# Patient Record
Sex: Female | Born: 1965
Health system: Southern US, Community
[De-identification: ages and names within clinical notes are randomized; demographics above are authoritative.]

## PROBLEM LIST (undated history)

## (undated) DIAGNOSIS — B019 Varicella without complication: Secondary | ICD-10-CM

## (undated) DIAGNOSIS — R112 Nausea with vomiting, unspecified: Secondary | ICD-10-CM

## (undated) DIAGNOSIS — D649 Anemia, unspecified: Secondary | ICD-10-CM

## (undated) DIAGNOSIS — M797 Fibromyalgia: Secondary | ICD-10-CM

## (undated) DIAGNOSIS — M0579 Rheumatoid arthritis with rheumatoid factor of multiple sites without organ or systems involvement: Secondary | ICD-10-CM

## (undated) DIAGNOSIS — M542 Cervicalgia: Secondary | ICD-10-CM

## (undated) DIAGNOSIS — R51 Headache: Secondary | ICD-10-CM

## (undated) DIAGNOSIS — Z9889 Other specified postprocedural states: Secondary | ICD-10-CM

## (undated) DIAGNOSIS — J45909 Unspecified asthma, uncomplicated: Secondary | ICD-10-CM

## (undated) DIAGNOSIS — I499 Cardiac arrhythmia, unspecified: Secondary | ICD-10-CM

## (undated) DIAGNOSIS — M2550 Pain in unspecified joint: Secondary | ICD-10-CM

## (undated) DIAGNOSIS — G43909 Migraine, unspecified, not intractable, without status migrainosus: Secondary | ICD-10-CM

## (undated) DIAGNOSIS — R0602 Shortness of breath: Secondary | ICD-10-CM

## (undated) DIAGNOSIS — R001 Bradycardia, unspecified: Secondary | ICD-10-CM

## (undated) HISTORY — DX: Bradycardia, unspecified: R00.1

## (undated) HISTORY — PX: ABDOMINAL HYSTERECTOMY: SHX81

## (undated) HISTORY — PX: OTHER SURGICAL HISTORY: SHX169

## (undated) HISTORY — PX: VAGINAL HYSTERECTOMY: SHX2639

## (undated) HISTORY — DX: Migraine, unspecified, not intractable, without status migrainosus: G43.909

## (undated) HISTORY — PX: BACK SURGERY: SHX140

## (undated) HISTORY — PX: DIAGNOSTIC LAPAROSCOPY: SUR761

## (undated) HISTORY — PX: BLADDER SURGERY: SHX569

## (undated) HISTORY — PX: TUBAL LIGATION: SHX77

## (undated) HISTORY — DX: Varicella without complication: B01.9

## (undated) HISTORY — DX: Anemia, unspecified: D64.9

## (undated) HISTORY — DX: Unspecified asthma, uncomplicated: J45.909

---
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
## 2003-08-02 HISTORY — PX: WRIST FRACTURE SURGERY: SHX121

## 2009-09-11 LAB — HM DEXA SCAN: HM Dexa Scan: NORMAL

## 2012-01-19 LAB — HM MAMMOGRAPHY: HM MAMMO: NEGATIVE

## 2012-06-06 ENCOUNTER — Encounter: Payer: Self-pay | Admitting: Cardiology

## 2012-07-04 ENCOUNTER — Encounter (INDEPENDENT_AMBULATORY_CARE_PROVIDER_SITE_OTHER): Payer: 59

## 2012-07-04 ENCOUNTER — Other Ambulatory Visit: Payer: Self-pay

## 2012-07-04 ENCOUNTER — Ambulatory Visit (INDEPENDENT_AMBULATORY_CARE_PROVIDER_SITE_OTHER): Payer: 59 | Admitting: Cardiology

## 2012-07-04 ENCOUNTER — Encounter: Payer: Self-pay | Admitting: Cardiology

## 2012-07-04 ENCOUNTER — Ambulatory Visit: Payer: Self-pay | Admitting: Cardiology

## 2012-07-04 VITALS — BP 126/59 | HR 52 | Ht 63.0 in | Wt 225.8 lb

## 2012-07-04 DIAGNOSIS — R001 Bradycardia, unspecified: Secondary | ICD-10-CM

## 2012-07-04 DIAGNOSIS — I498 Other specified cardiac arrhythmias: Secondary | ICD-10-CM

## 2012-07-04 DIAGNOSIS — R5381 Other malaise: Secondary | ICD-10-CM

## 2012-07-04 DIAGNOSIS — R5383 Other fatigue: Secondary | ICD-10-CM | POA: Insufficient documentation

## 2012-07-04 NOTE — Patient Instructions (Signed)
We will have you wear a monitor for 48 hours. Continue your normal activity.

## 2012-07-04 NOTE — Progress Notes (Signed)
Nancy [name] Date of Birth: August 28, 1965 Medical Record #[id number]  History of Present Illness: Nancy [name] is seen at the request of Quentin Mulling PA for evaluation of bradycardia. She is a pleasant 46 year old white female. She states she was in her usual state of good health until the past 3-4 months. She states she just has not felt well. She has felt fatigued and cold. She has had some spells of lightheadedness. An ECG was done about a month ago and showed a pulse rate of 46. She has been monitoring her pulse rate at work and has run between 40s to the low 50s. She has been going to the gym regularly and has lost 10 pounds. She states her heart rate will he get up 107 beats per minute when she is walking on the treadmill. She has no prior cardiac history.  Current Outpatient Prescriptions on File Prior to Visit  Medication Sig Dispense Refill  . estradiol (VIVELLE-DOT) 0.1 MG/24HR Place 1 patch onto the skin 2 (two) times a week.        Allergies  Allergen Reactions  . Erythromycin Diarrhea  . Percocet (Oxycodone-Acetaminophen)     Past Medical History  Diagnosis Date  . Bradycardia   . SOB (shortness of breath)   . Asthma     Past Surgical History  Procedure Date  . Cesarean section     1992 and 1995  . Tubal ligation     1995  . Vaginal hysterectomy     1998 or 1997  . Bladder surgery July 10th 2013    History  Smoking status  . Never Smoker   Smokeless tobacco  . Not on file    History  Alcohol Use No    Family History  Problem Relation Age of Onset  . Hyperlipidemia Mother   . Hypertension Father     Review of Systems: The review of systems is positive for feelings of fatigue and feeling cold. She has been snoring some recently.  All other systems were reviewed and are negative.  Physical Exam: BP 126/59  Pulse 52  Ht 5\' 3"  (1.6 m)  Wt 225 lb 12.8 oz (102.422 kg)  BMI 40.00 kg/m2 She is a pleasant, obese white female in no acute distress.The  patient is alert and oriented x 3.  The mood and affect are normal.  The skin is warm and dry.  Color is normal.  The HEENT exam reveals that the sclera are nonicteric.  The mucous membranes are moist.  The carotids are 2+ without bruits.  There is no thyromegaly.  There is no JVD.  The lungs are clear.   The heart exam reveals a regular rate with a normal S1 and S2.  There are no murmurs, gallops, or rubs.  The PMI is not displaced.   Abdominal exam reveals good bowel sounds.   There are no masses.  Exam of the legs reveal no clubbing, cyanosis, or edema.  The legs are without rashes.  The distal pulses are intact.  Cranial nerves II - XII are intact.  Motor and sensory functions are intact.  The gait is normal.  LABORATORY DATA: ECG today demonstrates sinus bradycardia with a rate of 53 beats per minute. It is otherwise normal. Laboratory data from 06/06/2012 shows a normal CBC and chemistry panel. Total cholesterol 201, triglycerides 89, HDL 65, LDL 118. Magnesium is 1.8, TSH 0.966, A1c of 6%  Assessment / Plan: 1. Bradycardia. While her heart rate is slow on not sure  this is really pathologic. We will have her wear a 48 hour monitor to try and document any worsening of her bradycardia or significant pauses. This will also allow [place] to judge her heart rate response to activity. Her cardiac exam and ECG are otherwise normal. Laboratory data was normal. Followup after these studies. We discussed the possibility of doing an echocardiogram but at this point we will await the results of her monitor.  2. Fatigue. It is unclear whether this is related to her bradycardia or other factors. She is at increased risk of sleep apnea which may be contributing to her symptoms of fatigue. I've encouraged her with her weight loss.

## 2012-07-05 ENCOUNTER — Encounter: Payer: Self-pay | Admitting: Cardiology

## 2012-07-05 DIAGNOSIS — I498 Other specified cardiac arrhythmias: Secondary | ICD-10-CM

## 2012-07-13 ENCOUNTER — Telehealth: Payer: Self-pay | Admitting: Cardiology

## 2012-07-13 NOTE — Telephone Encounter (Signed)
plz return call to pt [phone] regarding monitor results

## 2012-07-16 NOTE — Telephone Encounter (Signed)
I left the pt a message that Dr Elvis Coil nurse will be back in the office on Tuesday and will contact her with the results of her heart monitor.

## 2012-07-16 NOTE — Telephone Encounter (Signed)
F/U    Patient calling stating she has not heard from the nurse regarding her test results

## 2012-07-17 NOTE — Telephone Encounter (Signed)
Patient called no answer.LMTC. 

## 2012-07-19 NOTE — Telephone Encounter (Signed)
Spoke to patient 07/17/12 was told Dr.Jordan reviewed monitor.Monitor okay.

## 2012-08-09 ENCOUNTER — Other Ambulatory Visit: Payer: Self-pay

## 2012-08-09 DIAGNOSIS — R001 Bradycardia, unspecified: Secondary | ICD-10-CM

## 2013-05-08 ENCOUNTER — Encounter (HOSPITAL_COMMUNITY): Payer: Self-pay | Admitting: Pharmacy Technician

## 2013-05-08 ENCOUNTER — Encounter (HOSPITAL_COMMUNITY)
Admission: RE | Admit: 2013-05-08 | Discharge: 2013-05-08 | Disposition: A | Discharge status: Home / Self Care | Payer: 59 | Source: Ambulatory Visit | Attending: Obstetrics and Gynecology | Admitting: Obstetrics and Gynecology

## 2013-05-08 ENCOUNTER — Encounter (HOSPITAL_COMMUNITY): Payer: Self-pay

## 2013-05-08 DIAGNOSIS — Z01812 Encounter for preprocedural laboratory examination: Secondary | ICD-10-CM | POA: Insufficient documentation

## 2013-05-08 HISTORY — DX: Headache: R51

## 2013-05-08 HISTORY — DX: Shortness of breath: R06.02

## 2013-05-08 HISTORY — DX: Nausea with vomiting, unspecified: Z98.890

## 2013-05-08 HISTORY — DX: Cardiac arrhythmia, unspecified: I49.9

## 2013-05-08 HISTORY — DX: Other specified postprocedural states: R11.2

## 2013-05-08 LAB — CBC
HCT: 38.3 % (ref 36.0–46.0)
Hemoglobin: 12.1 g/dL (ref 12.0–15.0)
MCH: 26.7 pg (ref 26.0–34.0)
MCHC: 31.6 g/dL (ref 30.0–36.0)
Platelets: 187 10*3/uL (ref 150–400)
WBC: 6.6 10*3/uL (ref 4.0–10.5)

## 2013-05-08 NOTE — Patient Instructions (Signed)
Your procedure is scheduled on:05/31/13  Enter through the Main Entrance at :6am Pick up desk phone and dial [phone] and inform us of your arrival.  Please call [phone] if you have any problems the morning of surgery.  Remember: Do not eat food or drink liquids, including water, after midnight:Thursday   You may brush your teeth the morning of surgery.   DO NOT wear jewelry, eye make-up, lipstick,body lotion, or dark fingernail polish.  (Polished toes are ok) You may wear deodorant.  If you are to be admitted after surgery, leave suitcase in car until your room has been assigned. Patients discharged on the day of surgery will not be allowed to drive home. Wear loose fitting, comfortable clothes for your ride home.

## 2013-05-31 ENCOUNTER — Encounter (HOSPITAL_COMMUNITY): Payer: Self-pay | Admitting: *Deleted

## 2013-05-31 ENCOUNTER — Ambulatory Visit (HOSPITAL_COMMUNITY)
Admission: RE | Admit: 2013-05-31 | Discharge: 2013-05-31 | Disposition: A | Discharge status: Home / Self Care | Payer: 59 | Source: Ambulatory Visit | Attending: Obstetrics and Gynecology | Admitting: Obstetrics and Gynecology

## 2013-05-31 ENCOUNTER — Encounter (HOSPITAL_COMMUNITY): Payer: 59 | Admitting: Anesthesiology

## 2013-05-31 ENCOUNTER — Encounter (HOSPITAL_COMMUNITY)
Admission: RE | Disposition: A | Discharge status: Home / Self Care | Payer: Self-pay | Source: Ambulatory Visit | Attending: Obstetrics and Gynecology

## 2013-05-31 ENCOUNTER — Ambulatory Visit (HOSPITAL_COMMUNITY): Payer: 59 | Admitting: Anesthesiology

## 2013-05-31 DIAGNOSIS — N83209 Unspecified ovarian cyst, unspecified side: Secondary | ICD-10-CM | POA: Insufficient documentation

## 2013-05-31 DIAGNOSIS — T83721A Exposure of implanted vaginal mesh and other prosthetic materials into vagina, initial encounter: Secondary | ICD-10-CM | POA: Insufficient documentation

## 2013-05-31 DIAGNOSIS — Y838 Other surgical procedures as the cause of abnormal reaction of the patient, or of later complication, without mention of misadventure at the time of the procedure: Secondary | ICD-10-CM | POA: Insufficient documentation

## 2013-05-31 DIAGNOSIS — Z90710 Acquired absence of both cervix and uterus: Secondary | ICD-10-CM | POA: Insufficient documentation

## 2013-05-31 DIAGNOSIS — R1031 Right lower quadrant pain: Secondary | ICD-10-CM | POA: Insufficient documentation

## 2013-05-31 HISTORY — PX: SALPINGOOPHORECTOMY: SHX82

## 2013-05-31 HISTORY — PX: LAPAROSCOPY: SHX197

## 2013-05-31 SURGERY — LAPAROSCOPY OPERATIVE
Anesthesia: General | Site: Abdomen | Laterality: Bilateral | Wound class: Clean Contaminated

## 2013-05-31 MED ORDER — CEFAZOLIN SODIUM-DEXTROSE 2-3 GM-% IV SOLR
INTRAVENOUS | Status: AC
  Filled 2013-05-31: qty 50

## 2013-05-31 MED ORDER — LIDOCAINE HCL (CARDIAC) 20 MG/ML IV SOLN
INTRAVENOUS | Status: AC
  Filled 2013-05-31: qty 5

## 2013-05-31 MED ORDER — BUPIVACAINE HCL (PF) 0.5 % IJ SOLN
INTRAMUSCULAR | Status: AC
  Filled 2013-05-31: qty 30

## 2013-05-31 MED ORDER — ROCURONIUM BROMIDE 100 MG/10ML IV SOLN
INTRAVENOUS | Status: DC | PRN
  Administered 2013-05-31: 40 mg via INTRAVENOUS

## 2013-05-31 MED ORDER — BUPIVACAINE HCL (PF) 0.5 % IJ SOLN
INTRAMUSCULAR | Status: DC | PRN
  Administered 2013-05-31: 17 mL

## 2013-05-31 MED ORDER — NEOSTIGMINE METHYLSULFATE 1 MG/ML IJ SOLN
INTRAMUSCULAR | Status: AC
  Filled 2013-05-31: qty 1

## 2013-05-31 MED ORDER — SODIUM CHLORIDE 0.9 % IJ SOLN
INTRAMUSCULAR | Status: AC
  Filled 2013-05-31: qty 10

## 2013-05-31 MED ORDER — MIDAZOLAM HCL 2 MG/2ML IJ SOLN
INTRAMUSCULAR | Status: DC | PRN
  Administered 2013-05-31: 2 mg via INTRAVENOUS

## 2013-05-31 MED ORDER — ONDANSETRON HCL 4 MG/2ML IJ SOLN
INTRAMUSCULAR | Status: DC | PRN
  Administered 2013-05-31: 4 mg via INTRAVENOUS

## 2013-05-31 MED ORDER — LACTATED RINGERS IV SOLN
INTRAVENOUS | Status: DC
  Administered 2013-05-31 (×3): via INTRAVENOUS

## 2013-05-31 MED ORDER — ONDANSETRON HCL 4 MG/2ML IJ SOLN
INTRAMUSCULAR | Status: AC
  Filled 2013-05-31: qty 2

## 2013-05-31 MED ORDER — LIDOCAINE HCL (CARDIAC) 20 MG/ML IV SOLN
INTRAVENOUS | Status: DC | PRN
  Administered 2013-05-31: 20 mg via INTRAVENOUS

## 2013-05-31 MED ORDER — GLYCOPYRROLATE 0.2 MG/ML IJ SOLN
INTRAMUSCULAR | Status: DC | PRN
  Administered 2013-05-31: 0.2 mg via INTRAVENOUS
  Administered 2013-05-31: .8 mg via INTRAVENOUS

## 2013-05-31 MED ORDER — FENTANYL CITRATE 0.05 MG/ML IJ SOLN
25.0000 ug | INTRAMUSCULAR | Status: DC | PRN
  Administered 2013-05-31: 50 ug via INTRAVENOUS

## 2013-05-31 MED ORDER — PROPOFOL 10 MG/ML IV BOLUS
INTRAVENOUS | Status: DC | PRN
  Administered 2013-05-31: 200 mg via INTRAVENOUS

## 2013-05-31 MED ORDER — INDIGOTINDISULFONATE SODIUM 8 MG/ML IJ SOLN
INTRAMUSCULAR | Status: AC
  Filled 2013-05-31: qty 5

## 2013-05-31 MED ORDER — SCOPOLAMINE 1 MG/3DAYS TD PT72
1.0000 | MEDICATED_PATCH | TRANSDERMAL | Status: DC
  Administered 2013-05-31: 1.5 mg via TRANSDERMAL

## 2013-05-31 MED ORDER — MIDAZOLAM HCL 2 MG/2ML IJ SOLN
INTRAMUSCULAR | Status: AC
  Filled 2013-05-31: qty 2

## 2013-05-31 MED ORDER — NEOSTIGMINE METHYLSULFATE 1 MG/ML IJ SOLN
INTRAMUSCULAR | Status: DC | PRN
  Administered 2013-05-31: 5 mg via INTRAVENOUS

## 2013-05-31 MED ORDER — FENTANYL CITRATE 0.05 MG/ML IJ SOLN
INTRAMUSCULAR | Status: DC | PRN
  Administered 2013-05-31 (×3): 50 ug via INTRAVENOUS
  Administered 2013-05-31: 100 ug via INTRAVENOUS

## 2013-05-31 MED ORDER — HEPARIN SODIUM (PORCINE) 5000 UNIT/ML IJ SOLN
INTRAMUSCULAR | Status: AC
  Filled 2013-05-31: qty 1

## 2013-05-31 MED ORDER — DEXAMETHASONE SODIUM PHOSPHATE 10 MG/ML IJ SOLN
INTRAMUSCULAR | Status: DC | PRN
  Administered 2013-05-31: 10 mg via INTRAVENOUS

## 2013-05-31 MED ORDER — GLYCOPYRROLATE 0.2 MG/ML IJ SOLN
INTRAMUSCULAR | Status: AC
  Filled 2013-05-31: qty 1

## 2013-05-31 MED ORDER — CEFAZOLIN SODIUM-DEXTROSE 2-3 GM-% IV SOLR
2.0000 g | INTRAVENOUS | Status: AC
  Administered 2013-05-31: 2 g via INTRAVENOUS

## 2013-05-31 MED ORDER — FENTANYL CITRATE 0.05 MG/ML IJ SOLN
INTRAMUSCULAR | Status: AC
  Administered 2013-05-31: 50 ug via INTRAVENOUS
  Filled 2013-05-31: qty 2

## 2013-05-31 MED ORDER — FENTANYL CITRATE 0.05 MG/ML IJ SOLN
INTRAMUSCULAR | Status: AC
  Filled 2013-05-31: qty 5

## 2013-05-31 MED ORDER — PROPOFOL 10 MG/ML IV EMUL
INTRAVENOUS | Status: AC
  Filled 2013-05-31: qty 20

## 2013-05-31 MED ORDER — ROCURONIUM BROMIDE 100 MG/10ML IV SOLN
INTRAVENOUS | Status: AC
  Filled 2013-05-31: qty 1

## 2013-05-31 MED ORDER — FENTANYL CITRATE 0.05 MG/ML IJ SOLN
INTRAMUSCULAR | Status: AC
  Filled 2013-05-31: qty 2

## 2013-05-31 MED ORDER — GLYCOPYRROLATE 0.2 MG/ML IJ SOLN
INTRAMUSCULAR | Status: AC
  Filled 2013-05-31: qty 4

## 2013-05-31 MED ORDER — LIDOCAINE HCL 1 % IJ SOLN
INTRAMUSCULAR | Status: AC
  Filled 2013-05-31: qty 20

## 2013-05-31 MED ORDER — DEXAMETHASONE SODIUM PHOSPHATE 10 MG/ML IJ SOLN
INTRAMUSCULAR | Status: AC
  Filled 2013-05-31: qty 1

## 2013-05-31 MED ORDER — SCOPOLAMINE 1 MG/3DAYS TD PT72
MEDICATED_PATCH | TRANSDERMAL | Status: AC
  Administered 2013-05-31: 1.5 mg via TRANSDERMAL
  Filled 2013-05-31: qty 1

## 2013-05-31 SURGICAL SUPPLY — 28 items
BARRIER ADHS 3X4 INTERCEED (GAUZE/BANDAGES/DRESSINGS) IMPLANT
CABLE HIGH FREQUENCY MONO STRZ (ELECTRODE) ×3 IMPLANT
CATH ROBINSON RED A/P 16FR (CATHETERS) ×3 IMPLANT
CLOTH BEACON ORANGE TIMEOUT ST (SAFETY) ×3 IMPLANT
DERMABOND ADHESIVE PROPEN (GAUZE/BANDAGES/DRESSINGS) ×1
DERMABOND ADVANCED (GAUZE/BANDAGES/DRESSINGS) ×1
DERMABOND ADVANCED .7 DNX12 (GAUZE/BANDAGES/DRESSINGS) ×2 IMPLANT
DERMABOND ADVANCED .7 DNX6 (GAUZE/BANDAGES/DRESSINGS) ×2 IMPLANT
GLOVE BIO SURGEON STRL SZ7.5 (GLOVE) ×6 IMPLANT
GOWN PREVENTION PLUS LG XLONG (DISPOSABLE) ×3 IMPLANT
NEEDLE INSUFFLATION 120MM (ENDOMECHANICALS) ×3 IMPLANT
NS IRRIG 1000ML POUR BTL (IV SOLUTION) IMPLANT
PACK LAPAROSCOPY BASIN (CUSTOM PROCEDURE TRAY) ×3 IMPLANT
POUCH SPECIMEN RETRIEVAL 10MM (ENDOMECHANICALS) IMPLANT
PROTECTOR NERVE ULNAR (MISCELLANEOUS) ×3 IMPLANT
SEALER TISSUE G2 CVD JAW 35 (ENDOMECHANICALS) IMPLANT
SEALER TISSUE G2 CVD JAW 45CM (ENDOMECHANICALS) ×3 IMPLANT
SET IRRIG TUBING LAPAROSCOPIC (IRRIGATION / IRRIGATOR) ×3 IMPLANT
STRIP CLOSURE SKIN 1/4X3 (GAUZE/BANDAGES/DRESSINGS) IMPLANT
SUT VIC AB 3-0 PS2 18 (SUTURE) ×1
SUT VIC AB 3-0 PS2 18XBRD (SUTURE) ×2 IMPLANT
SUT VICRYL 0 UR6 27IN ABS (SUTURE) ×3 IMPLANT
SYR 30ML LL (SYRINGE) IMPLANT
TOWEL OR 17X24 6PK STRL BLUE (TOWEL DISPOSABLE) ×6 IMPLANT
TROCAR XCEL NON-BLD 11X100MML (ENDOMECHANICALS) ×3 IMPLANT
TROCAR XCEL NON-BLD 5MMX100MML (ENDOMECHANICALS) ×6 IMPLANT
WARMER LAPAROSCOPE (MISCELLANEOUS) ×3 IMPLANT
WATER STERILE IRR 1000ML POUR (IV SOLUTION) IMPLANT

## 2013-05-31 NOTE — Transfer of Care (Signed)
Immediate Anesthesia Transfer of Care Note  Patient: Nancy [name]  Procedure(s) Performed: Procedure(s) with comments: LAPAROSCOPY withbilateral salpingo-oophorectomy, lysis of adhesions (N/A) - clippings of Vaginal  mesh BILATERAL SALPINGO OOPHORECTOMY/TRIM MESH (Bilateral)  Patient Location: PACU  Anesthesia Type:General  Level of Consciousness: awake, alert  and oriented  Airway & Oxygen Therapy: Patient Spontanous Breathing and Patient connected to nasal cannula oxygen  Post-op Assessment: Report given to PACU RN and Post -op Vital signs reviewed and stable  Post vital signs: Reviewed and stable  Complications: No apparent anesthesia complications

## 2013-05-31 NOTE — Brief Op Note (Signed)
05/31/2013  8:51 AM  PATIENT:  Nancy [name]  47 y.o. female  PRE-OPERATIVE DIAGNOSIS:  right ovarian cyst, Right lower quadrant pain, mesh exposure  POST-OPERATIVE DIAGNOSIS:  right ovarian cyst, Right lower quadrant pain, mesh exposure  PROCEDURE:  Laparoscopy, bilateral salpingo-oophorectomy,lysis of adhesions, trimming of vaginal mesh  SURGEON:  Surgeon(s) and Role:    * Leslie Andrea, MD - Primary  PHYSICIAN ASSISTANT:   ASSISTANTS: none   ANESTHESIA:   general  EBL:  Total I/O In: 1000 [I.V.:1000] Out: 40 [Blood:40]  BLOOD ADMINISTERED:none  DRAINS: none   LOCAL MEDICATIONS USED:  MARCAINE     SPECIMEN:  Source of Specimen:  bilateral tubes/ovaries  DISPOSITION OF SPECIMEN:  PATHOLOGY  COUNTS:  YES  TOURNIQUET:  * No tourniquets in log *  DICTATION: .Other Dictation: Dictation Number  [id number]  PLAN OF CARE: Discharge to home after PACU  PATIENT DISPOSITION:  PACU - hemodynamically stable.   Delay start of Pharmacological VTE agent (>24hrs) due to surgical blood loss or risk of bleeding: not applicable

## 2013-05-31 NOTE — Op Note (Signed)
Nancy [name], Nancy [name]              ACCOUNT NO.:  [id number]  MEDICAL RECORD NO.:  [id number]  LOCATION:  [facility]                          FACILITY:  [facility]  PHYSICIAN:  Guy Sandifer. Henderson Cloud, M.D. DATE OF BIRTH:  05-29-1966  DATE OF PROCEDURE:  05/31/2013 DATE OF DISCHARGE:  05/31/2013                              OPERATIVE REPORT   PREOPERATIVE DIAGNOSES: 1. Right lower quadrant pain. 2. Right ovarian cyst. 3. Vaginal mesh exposure.  POSTOPERATIVE DIAGNOSES: 1. Right lower quadrant pain. 2. Right ovarian cyst. 3. Vaginal mesh exposure.  PROCEDURE:  Laparoscopy with bilateral salpingo-oophorectomy, lysis of adhesions, and trimming of vaginal mesh.  SURGEON:  Guy Sandifer. Henderson Cloud, M.D.  ANESTHESIA:  General with endotracheal intubation.  ESTIMATED BLOOD LOSS:  Less than 50 mL.  SPECIMENS:  Bilateral tubes and ovaries to Pathology.  INDICATIONS AND CONSENT:  This patient is a 47 year old married white female, status post hysterectomy who had a mid urethral sling placed 1 to 1-1/2 years ago in [place].  Following that, she has had some exposure of the mesh to the right side of the midline.  This has caused discomfort with intercourse.  It was trimmed slightly in the office but was unable to see it adequately to trim it further.  The patient has also had right lower quadrant pain for several weeks.  Ultrasound is consistent with approximately 3-cm simple right ovarian cyst that is shrinking.  However, the pain continues.  After discussion of options, laparoscopy with bilateral salpingo-oophorectomy and trimming of vaginal mesh has been discussed.  Potential risks and complications of surgery have been reviewed preoperatively including but not limited to infection, organ damage, bleeding requiring transfusion of blood products with HIV and hepatitis acquisition, DVT, PE, pneumonia, laparotomy, possibility of continued or recurrent pelvic or abdominal pain, painful intercourse has been  discussed.  The fact we are going to trim visible mesh and not totally resect it as well as the fact that further surgery could be required later to completely resolve that has also been discussed.  All questions were answered and consent was signed on the chart.  FINDINGS:  Upper abdomen is grossly normal.  In the pelvis, there are omental adhesions to the anterior abdominal wall at the pelvic brim. The right ovary contains an approximately 2.5-cm smooth translucent cyst.  It is slightly adherent on its medial pole.  The left ovary is normal but is adherent to the pelvic sidewall as well.  DESCRIPTION OF PROCEDURE:  The patient was taken to the operating room where she was identified, placed in dorsal supine position.  General anesthesia was induced via endotracheal intubation.  She is placed in dorsal lithotomy position.  She is prepped abdominally and vaginally and bladder straight catheterized and draped in sterile fashion.  Time-out was undertaken.  The infraumbilical and suprapubic areas were injected in midline with approximately 7 mL of 0.5% plain Marcaine.  A small infraumbilical incision was made.  Disposable Veress needle was placed and a good syringe and drop test were noted.  A 2 L of gas were insufflated with good tympany in the right upper quadrant.  Veress needle was removed and 10/11 Xcel bladeless disposable trocar sleeve was placed using  direct visualization with the diagnostic scope.  The operative scope was then used.  Small suprapubic incision was made in the midline and a 5-mm Xcel bladeless disposable trocar sleeve was placed under direct visualization without difficulty.  The adhesions were taken down with Enseal bipolar cautery cutting instrument without difficulty.  Good hemostasis was maintained.  Course of the left ureter is identified and seen to be clear of the area of surgery.  The left ovary is elevated with sharp and blunt dissection.  This mobilized  the ovary and the infundibulopelvic ligament.  Then using the Enseal bipolar cautery cutting instrument, the left infundibulopelvic ligament was taken down.  Remainder of the broad ligament was taken down freeing the ovary.  Ovaries delivered through the umbilical trocar sleeve.  Similar procedure was carried out on the right side.  The right ovary is also delivered.  Minor bipolar cautery was used to obtain complete hemostasis at the site of the BSO.  Inspection under reduced pneumoperitoneum reveals good hemostasis.  The remaining approximately 20 mL of 0.5% plain Marcaine is instilled into the peritoneal cavity.  Instruments are removed.  Suprapubic trocar sleeve was removed.  Pneumoperitoneum was reduced.  The umbilical trocar sleeve was removed.  The umbilical incision was closed with 0 Vicryl suture in the subcu layers under good visualization.  The skin was closed on both incisions with interrupted 3- 0 Vicryl and Dermabond was placed.  Attention was then turned to the vagina.  With adequate exposure, there is approximately 3-mm edge of the sling noted to the right side of the midline.  This has eroded through the mucosa, but there is no other further exposure of mesh identified. This was trimmed down to the level of the surrounding mucosa. Examination with the examining finger reveals the area to now be smooth. Good hemostasis was noted.  The procedure was then ended.  All counts correct.  The patient is awakened and taken to recovery room in stable condition.     Guy Sandifer Henderson Cloud, M.D.     JET/MEDQ  D:  05/31/2013  T:  05/31/2013  Job:  [id number]

## 2013-05-31 NOTE — Anesthesia Postprocedure Evaluation (Signed)
Anesthesia Post Note  Patient: Nancy [name]  Procedure(s) Performed: Procedure(s) (LRB): LAPAROSCOPY withbilateral salpingo-oophorectomy, lysis of adhesions (Bilateral) BILATERAL SALPINGO OOPHORECTOMY/TRIM MESH (Bilateral)  Anesthesia type: GA  Patient location: PACU  Post pain: Pain level controlled  Post assessment: Post-op Vital signs reviewed  Last Vitals:  Filed Vitals:   05/31/13 1015  BP:   Pulse:   Temp: 36.8 C  Resp: 12    Post vital signs: Reviewed  Level of consciousness: sedated  Complications: No apparent anesthesia complications

## 2013-05-31 NOTE — Anesthesia Procedure Notes (Signed)
Procedure Name: Intubation Date/Time: 05/31/2013 7:43 AM Performed by: Leslie Andrea Pre-anesthesia Checklist: Patient identified, Emergency Drugs available, Suction available and Patient being monitored Patient Re-evaluated:Patient Re-evaluated prior to inductionOxygen Delivery Method: Circle system utilized Preoxygenation: Pre-oxygenation with 100% oxygen Intubation Type: IV induction Ventilation: Mask ventilation without difficulty Laryngoscope Size: Miller and 2 Grade View: Grade II Tube type: Oral Tube size: 7.0 mm Airway Equipment and Method: Stylet Placement Confirmation: ETT inserted through vocal cords under direct vision,  positive ETCO2 and breath sounds checked- equal and bilateral Secured at: 7 cm Tube secured with: Tape Dental Injury: Teeth and Oropharynx as per pre-operative assessment

## 2013-05-31 NOTE — H&P (Signed)
Nancy [name] is an 47 y.o. female with RLQ pain for several months. U/S in office C/W about 3 cm simple right ovarian cyst that is shrinking on recent U/S. However, pain continues. After discussion, L/S is elected by patient. Also, she desires BSO.  Also wants to trim mesh.  Pertinent Gynecological History: Menses: N/A Bleeding: N/A Contraception: N/A DES exposure: denies Blood transfusions: none Sexually transmitted diseases: no past history Previous GYN Procedures: hysterectomy  Last mammogram: normal Date: 2014 Last pap: normal Date: 2014 OB History: G2, P2   Menstrual History: Menarche age: unknown  No LMP recorded. Patient has had a hysterectomy.    Past Medical History  Diagnosis Date  . Bradycardia   . Asthma   . Shortness of breath     with bradycardia  . Dysrhythmia     occ. palpitations  . Headache(784.0)   . PONV (postoperative nausea and vomiting)     Past Surgical History  Procedure Laterality Date  . Cesarean section      1992 and 1995  . Tubal ligation      1995  . Vaginal hysterectomy      1998 or 1997  . Bladder surgery  July 10th 2013  . Diagnostic laparoscopy      Family History  Problem Relation Age of Onset  . Hyperlipidemia Mother   . Hypertension Father     Social History:  reports that she has never smoked. She does not have any smokeless tobacco history on file. She reports that she does not drink alcohol or use illicit drugs.  Allergies:  Allergies  Allergen Reactions  . Advair Diskus [Fluticasone-Salmeterol] Cough    excessive  . Erythromycin Diarrhea and Nausea And Vomiting  . Percocet [Oxycodone-Acetaminophen] Other (See Comments)    hallucinations    Prescriptions prior to admission  Medication Sig Dispense Refill  . aspirin-acetaminophen-caffeine (EXCEDRIN MIGRAINE) 250-250-65 MG per tablet Take 2 tablets by mouth every 6 (six) hours as needed (migraine).      . Melatonin 3 MG TABS Take 1 tablet by mouth at bedtime as  needed (sleep).      . naproxen sodium (ANAPROX) 220 MG tablet Take 220 mg by mouth daily as needed.      Marland Kitchen albuterol (PROVENTIL HFA;VENTOLIN HFA) 108 (90 BASE) MCG/ACT inhaler Inhale 2 puffs into the lungs every 6 (six) hours as needed for wheezing or shortness of breath (rescue).      Marland Kitchen estradiol (ESTRACE) 0.1 MG/GM vaginal cream Place 0.5 g vaginally See admin instructions. 2-3 times weekly        Review of Systems  Constitutional: Negative for fever.    Blood pressure 121/75, pulse 54, temperature 97.9 F (36.6 C), temperature source Oral, resp. rate 18, height 5\' 3"  (1.6 m), weight 235 lb (106.595 kg), SpO2 98.00%. Physical Exam  Cardiovascular: Normal rate and regular rhythm.   Respiratory: Effort normal and breath sounds normal.  GI: Soft. There is no tenderness.  Genitourinary:  Small margin of mid urethral sling mesh palpable to right of midline Adnexa NT without palpable masses    No results found for this or any previous visit (from the past 24 hour(s)).  No results found.  Assessment/Plan: 47 yo S/P hysterectomy with RLQ pain and right ovarian cyst. Also exposure of mesh of mid urethral sling. D./W patient L/S, BSO and trim of visible mesh. Risks reviewed including infection, organ damage, bleeding/transfusion-HIV/Hep, DVT/PE, pneumonia, laparotomy. Patient understands that pelvic/abdominal pain or pain with intercourse may persist or get  worse after surgery. Also, more surgery could be necessary to address mesh exposure. All questions answered.  Zavia Pullen II,Glennis Montenegro E 05/31/2013, 7:26 AM

## 2013-05-31 NOTE — Anesthesia Preprocedure Evaluation (Signed)
Anesthesia Evaluation  Patient identified by MRN, date of birth, ID band Patient awake    Reviewed: Allergy & Precautions, H&P , Patient's Chart, lab work & pertinent test results, reviewed documented beta blocker date and time   Airway Mallampati: II TM Distance: >3 FB Neck ROM: full    Dental no notable dental hx. (+) Caps,    Pulmonary  breath sounds clear to auscultation  Pulmonary exam normal       Cardiovascular Rhythm:regular Rate:Normal     Neuro/Psych    GI/Hepatic   Endo/Other  Morbid obesity  Renal/GU      Musculoskeletal   Abdominal   Peds  Hematology   Anesthesia Other Findings Prominent capped front teeth. Dental advisory given  Reproductive/Obstetrics                           Anesthesia Physical Anesthesia Plan  ASA: III  Anesthesia Plan: General   Post-op Pain Management:    Induction: Intravenous  Airway Management Planned: Oral ETT  Additional Equipment:   Intra-op Plan:   Post-operative Plan: Extubation in OR  Informed Consent: I have reviewed the patients History and Physical, chart, labs and discussed the procedure including the risks, benefits and alternatives for the proposed anesthesia with the patient or authorized representative who has indicated his/her understanding and acceptance.   Dental Advisory Given and Dental advisory given  Plan Discussed with: CRNA and Surgeon  Anesthesia Plan Comments: (  Discussed general anesthesia, including possible nausea, instrumentation of airway, sore throat,pulmonary aspiration, etc. I asked if the were any outstanding questions, or  concerns before we proceeded. )        Anesthesia Quick Evaluation

## 2013-06-03 ENCOUNTER — Encounter (HOSPITAL_COMMUNITY): Payer: Self-pay | Admitting: Obstetrics and Gynecology

## 2013-11-20 ENCOUNTER — Ambulatory Visit (INDEPENDENT_AMBULATORY_CARE_PROVIDER_SITE_OTHER): Payer: 59 | Admitting: Family Medicine

## 2013-11-20 ENCOUNTER — Encounter: Payer: Self-pay | Admitting: Family Medicine

## 2013-11-20 VITALS — BP 110/80 | HR 68 | Temp 98.2°F | Ht 62.5 in | Wt 240.0 lb

## 2013-11-20 DIAGNOSIS — M5412 Radiculopathy, cervical region: Secondary | ICD-10-CM

## 2013-11-20 DIAGNOSIS — J4520 Mild intermittent asthma, uncomplicated: Secondary | ICD-10-CM | POA: Insufficient documentation

## 2013-11-20 DIAGNOSIS — IMO0001 Reserved for inherently not codable concepts without codable children: Secondary | ICD-10-CM

## 2013-11-20 DIAGNOSIS — J45909 Unspecified asthma, uncomplicated: Secondary | ICD-10-CM

## 2013-11-20 LAB — SEDIMENTATION RATE: SED RATE: 43 mm/h — AB (ref 0–22)

## 2013-11-20 MED ORDER — TRAMADOL HCL 50 MG PO TABS: ORAL_TABLET | ORAL | Status: DC

## 2013-11-20 NOTE — Progress Notes (Addendum)
Subjective:    Patient ID: Nancy [name], female    DOB: 07-26-1966, 48 y.o.   MRN: [id number]  HPI Patient seen to establish care.  History of mild intermittent asthma. She's had migraine headaches in the past. Reported rheumatic fever but no known cardiac complications. Nonsmoker. She's had several previous surgeries as outlined elsewhere.  Maj. acute issue is over 3 month history of cervical neck pains. Location is lower cervical region with some radiation to the right upper extremity. She's had occasional episodes of right upper extremity numbness and even transient weakness. She's not taking any medications other than tramadol which does help somewhat. No prior history of neck problems  Family history significant for hyperlipidemia, hypertension. Mother has had fibromyalgia and polymyalgia rheumatica as well as osteoporosis.  Patient's had previous bone density scanning which was normal. Previous hysterectomy. Prior labs from outside were reviewed. She's not had lab work about a year and a half.  Past Medical History  Diagnosis Date  . Bradycardia   . Asthma   . Shortness of breath     with bradycardia  . Dysrhythmia     occ. palpitations  . Headache(784.0)   . PONV (postoperative nausea and vomiting)   . Chicken pox   . Migraines    Past Surgical History  Procedure Laterality Date  . Cesarean section      1992 and 1995  . Tubal ligation      1995  . Vaginal hysterectomy      1998 or 1997  . Bladder surgery  July 10th 2013  . Diagnostic laparoscopy    . Laparoscopy Bilateral 05/31/2013    Procedure: LAPAROSCOPY withbilateral salpingo-oophorectomy, lysis of adhesions;  Surgeon: Allena Katz, MD;  Location: [facility];  Service: Gynecology;  Laterality: Bilateral;  with clippings of Vaginal  mesh  . Salpingoophorectomy Bilateral 05/31/2013    Procedure: BILATERAL SALPINGO OOPHORECTOMY/TRIM MESH;  Surgeon: Allena Katz, MD;  Location: [facility];  Service: Gynecology;   Laterality: Bilateral;  . Wrist fracture surgery  2005    reports that she has never smoked. She does not have any smokeless tobacco history on file. She reports that she does not drink alcohol or use illicit drugs. family history includes Arthritis in her mother; Cancer in her maternal grandfather and paternal grandmother; Diabetes in her maternal grandmother and paternal grandmother; Heart disease in her maternal grandfather and maternal grandmother; Hyperlipidemia in her mother; Hypertension in her father. Allergies  Allergen Reactions  . Advair Diskus [Fluticasone-Salmeterol] Cough    excessive  . Erythromycin Diarrhea and Nausea And Vomiting  . Percocet [Oxycodone-Acetaminophen] Other (See Comments)    hallucinations      Review of Systems  Constitutional: Negative for fever and chills.  Musculoskeletal: Positive for neck pain.  Neurological: Positive for weakness and numbness.       Objective:   Physical Exam  Constitutional: She appears well-developed and well-nourished.  Cardiovascular: Normal rate and regular rhythm.   Pulmonary/Chest: Effort normal and breath sounds normal. No respiratory distress. She has no wheezes. She has no rales.  Musculoskeletal:  Full Range of motion cervical spine. She has some mild muscle atrophy thenar muscles right hand compared to left. She is right-hand dominant.  Neurological:  Slightly diminished strength right upper extremity with biceps flexion. Grip strengths are symmetric. Deep tendon reflexes are symmetric upper extremities          Assessment & Plan:  Cervical radiculopathy symptoms involving right side. She does have  evidence for some mild muscle atrophy right thenar muscles. Symptoms present for over 3 months and she is reporting some intermittent weakness and numbness. Obtain cervical MRI to further assess  Myalgias and myositis. Doubt PMR. Check sed rate. Distribution not consistent with fibromyalgia. Consider complete  physical and full labs including thyroid function within the next couple of months  MRI C7-T1 disc bulge with nerve root impingement.  Will set up neurosurgical referral.  Pt aware.

## 2013-11-20 NOTE — Patient Instructions (Signed)
Cervical Radiculopathy  Cervical radiculopathy happens when a nerve in the neck is pinched or bruised by a slipped (herniated) disk or by arthritic changes in the bones of the cervical spine. This can occur due to an injury or as part of the normal aging process. Pressure on the cervical nerves can cause pain or numbness that runs from your neck all the way down into your arm and fingers.  CAUSES   There are many possible causes, including:  · Injury.  · Muscle tightness in the neck from overuse.  · Swollen, painful joints (arthritis).  · Breakdown or degeneration in the bones and joints of the spine (spondylosis) due to aging.  · Bone spurs that may develop near the cervical nerves.  SYMPTOMS   Symptoms include pain, weakness, or numbness in the affected arm and hand. Pain can be severe or irritating. Symptoms may be worse when extending or turning the neck.  DIAGNOSIS   Your caregiver will ask about your symptoms and do a physical exam. He or she may test your strength and reflexes. X-rays, CT scans, and MRI scans may be needed in cases of injury or if the symptoms do not go away after a period of time. Electromyography (EMG) or nerve conduction testing may be done to study how your nerves and muscles are working.  TREATMENT   Your caregiver may recommend certain exercises to help relieve your symptoms. Cervical radiculopathy can, and often does, get better with time and treatment. If your problems continue, treatment options may include:  · Wearing a soft collar for short periods of time.  · Physical therapy to strengthen the neck muscles.  · Medicines, such as nonsteroidal anti-inflammatory drugs (NSAIDs), oral corticosteroids, or spinal injections.  · Surgery. Different types of surgery may be done depending on the cause of your problems.  HOME CARE INSTRUCTIONS   · Put ice on the affected area.  · Put ice in a plastic bag.  · Place a towel between your skin and the bag.  · Leave the ice on for 15-20 minutes,  03-04 times a day or as directed by your caregiver.  · If ice does not help, you can try using heat. Take a warm shower or bath, or use a hot water bottle as directed by your caregiver.  · You may try a gentle neck and shoulder massage.  · Use a flat pillow when you sleep.  · Only take over-the-counter or prescription medicines for pain, discomfort, or fever as directed by your caregiver.  · If physical therapy was prescribed, follow your caregiver's directions.  · If a soft collar was prescribed, use it as directed.  SEEK IMMEDIATE MEDICAL CARE IF:   · Your pain gets much worse and cannot be controlled with medicines.  · You have weakness or numbness in your hand, arm, face, or leg.  · You have a high fever or a stiff, rigid neck.  · You lose bowel or bladder control (incontinence).  · You have trouble with walking, balance, or speaking.  MAKE SURE YOU:   · Understand these instructions.  · Will watch your condition.  · Will get help right away if you are not doing well or get worse.  Document Released: 04/12/2001 Document Revised: 10/10/2011 Document Reviewed: 03/01/2011  ExitCare® Patient Information ©2014 [employer].

## 2013-11-27 ENCOUNTER — Ambulatory Visit (HOSPITAL_COMMUNITY)
Admission: RE | Admit: 2013-11-27 | Discharge: 2013-11-27 | Disposition: A | Discharge status: Home / Self Care | Payer: 59 | Source: Ambulatory Visit | Attending: Family Medicine | Admitting: Family Medicine

## 2013-11-27 DIAGNOSIS — M5412 Radiculopathy, cervical region: Secondary | ICD-10-CM

## 2013-11-27 DIAGNOSIS — M542 Cervicalgia: Secondary | ICD-10-CM | POA: Insufficient documentation

## 2013-11-29 NOTE — Addendum Note (Signed)
Addended by: Eulas Post on: 11/29/2013 08:19 AM   Modules accepted: Orders

## 2013-12-02 ENCOUNTER — Telehealth: Payer: Self-pay

## 2013-12-02 MED ORDER — METHOCARBAMOL 500 MG PO TABS: 500.0000 mg | ORAL_TABLET | Freq: Every evening | ORAL | Status: DC | PRN

## 2013-12-02 NOTE — Telephone Encounter (Signed)
Per Dr. Elease Hashimoto send in Robaxin 500mg  take 1 at bedtime #30 with 1 refill.

## 2013-12-16 ENCOUNTER — Other Ambulatory Visit: Payer: Self-pay | Admitting: *Deleted

## 2013-12-16 MED ORDER — SCOPOLAMINE 1 MG/3DAYS TD PT72: 1.0000 | MEDICATED_PATCH | TRANSDERMAL | Status: DC

## 2014-01-10 ENCOUNTER — Other Ambulatory Visit: Payer: Self-pay

## 2014-01-10 MED ORDER — ALBUTEROL SULFATE HFA 108 (90 BASE) MCG/ACT IN AERS: 2.0000 | INHALATION_SPRAY | Freq: Four times a day (QID) | RESPIRATORY_TRACT | Status: DC | PRN

## 2014-01-11 ENCOUNTER — Other Ambulatory Visit: Payer: Self-pay | Admitting: Family Medicine

## 2014-01-11 MED ORDER — PREDNISONE 10 MG PO TABS: ORAL_TABLET | ORAL | Status: DC

## 2014-01-11 NOTE — Progress Notes (Signed)
[facility] w/ severe L sided head and neck pain.  Has hx of neck pain but head pain is new.  No blurry or double vision.  Head is TTP.  Pain is 'lightning like' and will 'shoot right down'.  Suspect Trigeminal Neuralgia.  Start pred taper and monitor for improvement.

## 2014-01-12 ENCOUNTER — Emergency Department (HOSPITAL_COMMUNITY)
Admission: EM | Admit: 2014-01-12 | Discharge: 2014-01-12 | Disposition: A | Discharge status: Home / Self Care | Payer: 59 | Attending: Emergency Medicine | Admitting: Emergency Medicine

## 2014-01-12 ENCOUNTER — Encounter (HOSPITAL_COMMUNITY): Payer: Self-pay | Admitting: Emergency Medicine

## 2014-01-12 DIAGNOSIS — Z8679 Personal history of other diseases of the circulatory system: Secondary | ICD-10-CM | POA: Insufficient documentation

## 2014-01-12 DIAGNOSIS — Z8619 Personal history of other infectious and parasitic diseases: Secondary | ICD-10-CM | POA: Insufficient documentation

## 2014-01-12 DIAGNOSIS — R519 Headache, unspecified: Secondary | ICD-10-CM

## 2014-01-12 DIAGNOSIS — J45909 Unspecified asthma, uncomplicated: Secondary | ICD-10-CM | POA: Insufficient documentation

## 2014-01-12 DIAGNOSIS — M5481 Occipital neuralgia: Secondary | ICD-10-CM

## 2014-01-12 DIAGNOSIS — G43909 Migraine, unspecified, not intractable, without status migrainosus: Secondary | ICD-10-CM | POA: Insufficient documentation

## 2014-01-12 DIAGNOSIS — R51 Headache: Secondary | ICD-10-CM | POA: Insufficient documentation

## 2014-01-12 DIAGNOSIS — M531 Cervicobrachial syndrome: Secondary | ICD-10-CM | POA: Insufficient documentation

## 2014-01-12 DIAGNOSIS — Z791 Long term (current) use of non-steroidal anti-inflammatories (NSAID): Secondary | ICD-10-CM | POA: Insufficient documentation

## 2014-01-12 HISTORY — DX: Cervicalgia: M54.2

## 2014-01-12 MED ORDER — BUPIVACAINE HCL 0.25 % IJ SOLN
15.0000 mL | Freq: Once | INTRAMUSCULAR | Status: DC
  Filled 2014-01-12: qty 15

## 2014-01-12 MED ORDER — HYDROCODONE-ACETAMINOPHEN 5-325 MG PO TABS: 2.0000 | ORAL_TABLET | ORAL | Status: DC | PRN

## 2014-01-12 MED ORDER — HYDROCODONE-ACETAMINOPHEN 5-325 MG PO TABS
1.0000 | ORAL_TABLET | Freq: Once | ORAL | Status: AC
  Administered 2014-01-12: 1 via ORAL
  Filled 2014-01-12: qty 1

## 2014-01-12 MED ORDER — CARBAMAZEPINE 200 MG PO TABS: 100.0000 mg | ORAL_TABLET | Freq: Two times a day (BID) | ORAL | Status: DC

## 2014-01-12 MED ORDER — KETOROLAC TROMETHAMINE 60 MG/2ML IM SOLN
60.0000 mg | Freq: Once | INTRAMUSCULAR | Status: AC
  Administered 2014-01-12: 60 mg via INTRAMUSCULAR
  Filled 2014-01-12: qty 2

## 2014-01-12 NOTE — ED Notes (Signed)
Pt reports MRI in April, dx with neck bulging disks. Pt saw neurologist on Wednesday, but headache started on Friday. Since Friday pt has had constant pain 4/10, but has flare ups and pain is 10/10. Denies dizziness or double vision. Denies n/v/d. Pt has taken tramadol, muscle relaxer, aleve, and Excedrin migraine with no relief.

## 2014-01-12 NOTE — ED Provider Notes (Addendum)
CSN: [id number]     Arrival date & time 01/12/14  1059 History   First MD Initiated Contact with Patient 01/12/14 1127     Chief Complaint  Patient presents with  . Headache     (Consider location/radiation/quality/duration/timing/severity/associated sxs/prior Treatment) Patient is a 48 y.o. female presenting with headaches. The history is provided by the patient.  Headache Associated symptoms: no fever, no nausea, no numbness, no photophobia and no sore throat    patient presents with headache for the last 2 days. It is in the back of her head and lightening-like. It comes and goes. His been worse over the last few days also. She was seen by her doctor yesterday and was given a steroid shot. No numbness or weakness. She has a history of migraines, but states is different headache. Her migraines have shown the right frontal part of her head and this is on the left back and side.  Past Medical History  Diagnosis Date  . Bradycardia   . Asthma   . Shortness of breath     with bradycardia  . Dysrhythmia     occ. palpitations  . Headache(784.0)   . PONV (postoperative nausea and vomiting)   . Chicken pox   . Migraines   . Neck pain     bulging disk   Past Surgical History  Procedure Laterality Date  . Cesarean section      1992 and 1995  . Tubal ligation      1995  . Vaginal hysterectomy      1998 or 1997  . Bladder surgery  July 10th 2013  . Diagnostic laparoscopy    . Laparoscopy Bilateral 05/31/2013    Procedure: LAPAROSCOPY withbilateral salpingo-oophorectomy, lysis of adhesions;  Surgeon: Allena Katz, MD;  Location: [facility];  Service: Gynecology;  Laterality: Bilateral;  with clippings of Vaginal  mesh  . Salpingoophorectomy Bilateral 05/31/2013    Procedure: BILATERAL SALPINGO OOPHORECTOMY/TRIM MESH;  Surgeon: Allena Katz, MD;  Location: [facility];  Service: Gynecology;  Laterality: Bilateral;  . Wrist fracture surgery  2005   Family History  Problem  Relation Age of Onset  . Hyperlipidemia Mother   . Arthritis Mother   . Hypertension Father   . Heart disease Maternal Grandmother   . Diabetes Maternal Grandmother   . Cancer Maternal Grandfather     colon  . Heart disease Maternal Grandfather   . Cancer Paternal Grandmother     lung  . Diabetes Paternal Grandmother    History  Substance Use Topics  . Smoking status: Never Smoker   . Smokeless tobacco: Not on file  . Alcohol Use: No   OB History   Grav Para Term Preterm Abortions TAB SAB Ect Mult Living                 Review of Systems  Constitutional: Negative for fever.  HENT: Negative for facial swelling and sore throat.   Eyes: Negative for photophobia.  Respiratory: Negative for shortness of breath.   Cardiovascular: Negative for chest pain.  Gastrointestinal: Negative for nausea.  Neurological: Positive for headaches. Negative for weakness and numbness.      Allergies  Advair diskus; Erythromycin; and Percocet  Home Medications   Prior to Admission medications   Medication Sig Start Date End Date Taking? Authorizing Provider  albuterol (PROVENTIL HFA;VENTOLIN HFA) 108 (90 BASE) MCG/ACT inhaler Inhale 2 puffs into the lungs every 6 (six) hours as needed for wheezing or shortness of  breath (rescue). 01/10/14  Yes Eulas Post, MD  aspirin-acetaminophen-caffeine (EXCEDRIN MIGRAINE) [phone] MG per tablet Take 2 tablets by mouth every 6 (six) hours as needed (migraine).   Yes Historical Provider, MD  estradiol (ESTRACE) 0.1 MG/GM vaginal cream Place 0.5 g vaginally See admin instructions. 2-3 times weekly   Yes Historical Provider, MD  Melatonin 3 MG TABS Take 1 tablet by mouth at bedtime as needed (sleep).   Yes Historical Provider, MD  methocarbamol (ROBAXIN) 500 MG tablet Take 1 tablet (500 mg total) by mouth at bedtime as needed for muscle spasms. 12/02/13  Yes Eulas Post, MD  naproxen sodium (ANAPROX) 220 MG tablet Take 220 mg by mouth daily as  needed (pain).    Yes Historical Provider, MD  predniSONE (DELTASONE) 10 MG tablet 3 tabs x3 days and then 2 tabs x3 days and then 1 tab x3 days.  Take w/ food. 01/11/14  Yes Midge Minium, MD  traMADol (ULTRAM) 50 MG tablet Take 50 mg by mouth every 6 (six) hours as needed for moderate pain.   Yes Historical Provider, MD   BP 130/80  Pulse 61  Temp(Src) 98.3 F (36.8 C) (Oral)  Resp 16  SpO2 95% Physical Exam  Constitutional: She is oriented to person, place, and time. She appears well-developed and well-nourished.  HENT:  There is a tenderness/trigger point in left upper occipital area or skull. No rash. Some of this morning. No numbness weakness.  Eyes: EOM are normal. Pupils are equal, round, and reactive to light.  Neck: Normal range of motion. Neck supple.  Cardiovascular: Normal rate and regular rhythm.   Pulmonary/Chest: Effort normal.  Musculoskeletal:  Mild chronic right-sided upper chimney weakness.  Neurological: She is alert and oriented to person, place, and time. No cranial nerve deficit.    ED Course  NERVE BLOCK Date/Time: 01/12/2014 12:00 PM Performed by: Davonna Belling R. Authorized by: Mackie Pai Consent: Verbal consent obtained. Risks and benefits: risks, benefits and alternatives were discussed Consent given by: patient and spouse Patient understanding: patient states understanding of the procedure being performed Relevant documents: relevant documents present and verified Site marked: the operative site was marked Required items: required blood products, implants, devices, and special equipment available Patient identity confirmed: verbally with patient and arm band Time out: Immediately prior to procedure a "time out" was called to verify the correct patient, procedure, equipment, support staff and site/side marked as required. Indications: pain relief Body area: head Nerve: occipital Laterality: left Patient sedated: no Preparation:  Patient was prepped and draped in the usual sterile fashion. Patient position: prone Needle gauge: 25 G Location technique: anatomical landmarks Local anesthetic: bupivacaine 0.25% with epinephrine Anesthetic total: 4 ml Outcome: pain improved Patient tolerance: Patient tolerated the procedure well with no immediate complications.   (including critical care time) Labs Review Labs Reviewed - No data to display  Imaging Review No results found.   EKG Interpretation None      MDM   Final diagnoses:  None    Patient with posterior head pain. There is a sharp and lightening component. May be occipital neuralgia. She had brief relief with occipital nerve block by me. The pain did return rather quickly though. She R. he has had a Decadron shot and has his prescription for prednisone taper. Will give carbamazepine as a trial and some pain medicines. Will followup with her PCP doubt severe intracranial abnormality or cervical abnormality.   Jasper Riling. Alvino Chapel, MD 01/12/14 [place]  Rita Ohara, MD 01/12/14 1444

## 2014-01-12 NOTE — Discharge Instructions (Signed)
Occipital Neuralgia Neuralgias are attacks of sharp stabbing pain. They may be intermittent (comes and goes) or constant in nature. They may be brief attacks that last seconds to minutes and may come back for days to weeks. The neuralgias can occur as a result of a herpes zoster (shingles), chickenpox infection, or even following a herpes simplex infection (cold sore). TYPES OF NEURALGIA  When these pains are located in the back of the head and neck they are called occipital neuralgias.  When the pain is located between ribs it is called intercostal neuralgia.  When the pain is located in the face it is called trigeminal neuralgia. This is the most common neuralgia. It causes sharp, shock like pain on one side of your face. The neuralgias, which follow herpes zoster infections, often produce a constant burning pain. They may last from weeks to months and even years. The attacks of pain may come from injury or inflammation (irritation) to a nerve. Often the cause is unknown. The episodes of pain may be caused by light touch, movement, or even eating and sneezing. Usually these neuralgias occur after age forty. The neuralgias following shingles and trigeminal neuralgia are the most common. Although painful, these episodes do not threaten life and tend to lessen as we grow older. TREATMENT  There are many medications that may be helpful in the treatment of this disorder. Sometimes several medications may have to be tried before the right combination can be found for you. Some of these medications are:  Only take over-the-counter or prescription medications for pain, discomfort, or fever as directed by your caregiver.  Narcotic medications may be used to control the pain.  Antidepressants and medications used in epilepsy (seizure disorders) may be useful. LET YOUR CAREGIVER KNOW ABOUT:  If you do not obtain relief from medications.  Problems that are getting worse rather than better.  Troubling  side effects that you think are coming from the medication. Do not be discouraged if you do not obtain instant relief from the medications or help given you. Your caregiver can help you get through these episodes of pain with some persistence (continued trying) on your part also. Document Released: 07/12/2001 Document Revised: 10/10/2011 Document Reviewed: 07/18/2005 Exit[employer] Patient Information 2014 [employer].  

## 2014-01-15 ENCOUNTER — Ambulatory Visit (INDEPENDENT_AMBULATORY_CARE_PROVIDER_SITE_OTHER): Payer: 59 | Admitting: Family Medicine

## 2014-01-15 VITALS — BP 120/76 | HR 60 | Temp 97.7°F | Wt 234.0 lb

## 2014-01-15 DIAGNOSIS — R51 Headache: Secondary | ICD-10-CM

## 2014-01-15 DIAGNOSIS — R519 Headache, unspecified: Secondary | ICD-10-CM

## 2014-01-16 ENCOUNTER — Encounter: Payer: Self-pay | Admitting: Family Medicine

## 2014-01-16 NOTE — Progress Notes (Signed)
Subjective:    Patient ID: Nancy [name], female    DOB: [birth year], 48 y.o.   MRN: [id number]  HPI  Follow up headaches.  Acute onset of headache left occiput last Friday.  Somewhat intermittent. Sharp pulsating quality up to 10/10 severity with some mild dysesthesia.  Sore to touch with no rash.  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  By  Sunday, pain worse and went to ER with dx of probable occipital neuralgia. Lidocaine injection did provide some mild temporary relief.  She was given tegretol 100 mg one half tablet bid but headache persists, though slightly improved in severity. Has taken some hydrocodone with mild relief.    No facial or focal weakness.   No confusion.  No hx of similar headache.  No clear triggers. Nonexertional.  Recent Cervicalgia but this pain is totally different.     Past Medical History  Diagnosis Date  . Bradycardia   . Asthma   . Shortness of breath     with bradycardia  . Dysrhythmia     occ. palpitations  . Headache(784.0)   . PONV (postoperative nausea and vomiting)   . Chicken pox   . Migraines   . Neck pain     bulging disk   Past Surgical History  Procedure Laterality Date  . Cesarean section      1992 and 1995  . Tubal ligation      1995  . Vaginal hysterectomy      19 98 or 1997  . Bladder surgery  July 10th 2013  . Diagnostic laparoscopy    . Laparoscopy Bilateral 05/31/2013    Procedure: LAPAROSCOPY withbilateral salpingo-oophorectomy, lysis of adhesions;  Surgeon: Allena Katz, MD;  Location: [facility];  Service: Gynecology;  Laterality: Bilateral;  with clippings of Vaginal  mesh  . Salpingoophorectomy Bilateral 05/31/2013    Procedure: BILATERAL SALPINGO OOPHORECTOMY/TRIM MESH;  Surgeon: Allena Katz, MD;  Location: [facility];  Service: Gynecology;  Laterality: Bilateral;  . Wrist fracture surgery  2005    reports that she has never  smoked. She does not have any smokeless tobacco history on file. She reports that she does not drink alcohol or use illicit drugs. family history includes Arthritis in her mother; Cancer in her maternal grandfather and paternal grandmother; Diabetes in her maternal grandmother and paternal grandmother; Heart disease in her maternal grandfather and maternal grandmother; Hyperlipidemia in her mother; Hypertension in her father. Allergies  Allergen Reactions  . Advair Diskus [Fluticasone-Salmeterol] Cough    excessive  . Erythromycin Diarrhea and Nausea And Vomiting  . Percocet [Oxycodone-Acetaminophen] Other (See Comments)    hallucinations     Review of Systems  Constitutional: Negative for fever, chills, appetite change and unexpected weight change.  HENT: Negative for ear pain.   Eyes: Negative for visual disturbance.  Neurological: Positive for headaches. Negative for dizziness, seizures, syncope, facial asymmetry and weakness.  Psychiatric/Behavioral: Negative for confusion.       Objective:   Physical Exam  Constitutional: She is oriented to person, place, and time. She appears well-developed and well-nourished.  HENT:  Head: Normocephalic and atraumatic.  Minimally tender left occipital region with no rash or edema  Cardiovascular: Normal rate.   Neurological: She is alert and oriented to person, place, and time. No cranial nerve deficit. Coordination normal.  Skin: No rash noted.          Assessment &  Plan:  Left occipital headache.  ?Occipital neuralgia.  Refill Hydrocodone for severe pain.  Increase Tegretol to 100 mg po bid.  Try topical heat or cold compresses. Consider neurology referral.

## 2014-01-16 NOTE — Addendum Note (Signed)
Addended by: Eulas Post on: 01/16/2014 01:03 PM   Modules accepted: Orders

## 2014-02-03 ENCOUNTER — Encounter: Payer: Self-pay | Admitting: *Deleted

## 2014-02-04 ENCOUNTER — Other Ambulatory Visit: Payer: Self-pay

## 2014-02-04 MED ORDER — METHOCARBAMOL 500 MG PO TABS: 500.0000 mg | ORAL_TABLET | Freq: Every evening | ORAL | Status: DC | PRN

## 2014-02-05 ENCOUNTER — Ambulatory Visit (INDEPENDENT_AMBULATORY_CARE_PROVIDER_SITE_OTHER): Payer: 59 | Admitting: Neurology

## 2014-02-05 ENCOUNTER — Encounter: Payer: Self-pay | Admitting: Neurology

## 2014-02-05 VITALS — BP 120/78 | HR 75 | Resp 16 | Ht 63.0 in | Wt 241.7 lb

## 2014-02-05 DIAGNOSIS — M5481 Occipital neuralgia: Secondary | ICD-10-CM

## 2014-02-05 DIAGNOSIS — M5010 Cervical disc disorder with radiculopathy, unspecified cervical region: Secondary | ICD-10-CM

## 2014-02-05 DIAGNOSIS — R51 Headache: Secondary | ICD-10-CM

## 2014-02-05 DIAGNOSIS — R29898 Other symptoms and signs involving the musculoskeletal system: Secondary | ICD-10-CM

## 2014-02-05 DIAGNOSIS — M531 Cervicobrachial syndrome: Secondary | ICD-10-CM | POA: Diagnosis not present

## 2014-02-05 DIAGNOSIS — M5412 Radiculopathy, cervical region: Secondary | ICD-10-CM

## 2014-02-05 DIAGNOSIS — G4486 Cervicogenic headache: Secondary | ICD-10-CM

## 2014-02-05 MED ORDER — BUPIVACAINE HCL (PF) 0.25 % IJ SOLN
3.0000 mL | Freq: Once | INTRAMUSCULAR | Status: AC
  Administered 2014-02-05: 3 mL

## 2014-02-05 MED ORDER — TRIAMCINOLONE ACETONIDE 40 MG/ML IJ SUSP
40.0000 mg | Freq: Once | INTRAMUSCULAR | Status: AC
  Administered 2014-02-05: 40 mg

## 2014-02-05 NOTE — Patient Instructions (Signed)
I will give you another occipital nerve block today We will schedule you for physical therapy for the neck and occupational therapy. Follow up in 6 weeks.  If therapy or block not effective, will have to consider medications such as gabapentin.

## 2014-02-05 NOTE — Progress Notes (Signed)
NEUROLOGY CONSULTATION NOTE  Nancy [name] MRN: [id number] DOB: [birth year]  Referring provider: Dr. Jarold Song Primary care provider: Dr. Jarold Song  Reason for consult:  Headache  HISTORY OF PRESENT ILLNESS: Nancy [name] is a 48 year old right-handed woman with history of bradycardia, asthma, neck pan and migraines who presents for occipital headache.  Records and images reviewed.  She began having bilateral neck and shoulder pain in January. She then began noticing weakness in her right arm and hand. She began dropping objects. She denied any numbness or tingling.  An MRI CERVICAL SPINE WO CONTRAST was performed on 11/29/13, which showed cervical spondylotic changes from with C5-6 broad-based disc osteophyte causing spinal stenosis and mild cord flattening greater on the left, and C6-7 lobulated broad-based disc osteophyte complex causing spinal stenosis and cord flattening greater on the left.  She was symptomatic on the right, however the MRI revealed greater stenosis on the left side. She was given Tramadol and Robaxin for pain.  The neck pain seemed to resolve by the time she followed up with neurosurgery in June.  The neurosurgeon felt that conservative management was appropriate at this time. A couple days later, she woke up with left sided occipital head pain. She noted a constant aching with fairly constant stabbing and shooting pain. There was no associated numbness or tingling. Intensity was a 10 out of 10. She did not note any neck pain. She went to the ED where she received an occipital nerve block, which only helped for about 10 minutes. She was started on Tegretol but has since discontinued it as it caused nausea and grogginess. The stabbing and shooting pain has improved, although it hasn't completely resolved. She still wakes up with a daily headache. It is on the top of her head and is described as a dull ache. It's about a 2/10 in intensity. She also started experiencing neck and  shoulder pain again, although not yet severe. She says that some movement of her neck does slightly increase intensity of the head pain. She really hasn't taken any pain medications. Occasionally she will take a tramadol. She still reports weakness in the right arm.  She does have a history of migraine, associated with visual symptoms and nausea. Her migraines are typically right-sided. They are well controlled and she hasn't had one in some months.  PAST MEDICAL HISTORY: Past Medical History  Diagnosis Date  . Bradycardia   . Asthma   . Shortness of breath     with bradycardia  . Dysrhythmia     occ. palpitations  . Headache(784.0)   . PONV (postoperative nausea and vomiting)   . Chicken pox   . Migraines   . Neck pain     bulging disk    PAST SURGICAL HISTORY: Past Surgical History  Procedure Laterality Date  . Cesarean section      1992 and 1995  . Tubal ligation      1995  . Vaginal hysterectomy      1998 or 1997  . Bladder surgery  July 10th 2013  . Diagnostic laparoscopy    . Laparoscopy Bilateral 05/31/2013    Procedure: LAPAROSCOPY withbilateral salpingo-oophorectomy, lysis of adhesions;  Surgeon: Allena Katz, MD;  Location: [facility];  Service: Gynecology;  Laterality: Bilateral;  with clippings of Vaginal  mesh  . Salpingoophorectomy Bilateral 05/31/2013    Procedure: BILATERAL SALPINGO OOPHORECTOMY/TRIM MESH;  Surgeon: Allena Katz, MD;  Location: [facility];  Service: Gynecology;  Laterality: Bilateral;  .  Wrist fracture surgery  2005    MEDICATIONS: Current Outpatient Prescriptions on File Prior to Visit  Medication Sig Dispense Refill  . albuterol (PROVENTIL HFA;VENTOLIN HFA) 108 (90 BASE) MCG/ACT inhaler Inhale 2 puffs into the lungs every 6 (six) hours as needed for wheezing or shortness of breath (rescue).  1 Inhaler  5  . aspirin-acetaminophen-caffeine (EXCEDRIN MIGRAINE) [phone] MG per tablet Take 2 tablets by mouth every 6 (six) hours as needed  (migraine).      Marland Kitchen estradiol (ESTRACE) 0.1 MG/GM vaginal cream Place 0.5 g vaginally See admin instructions. 2-3 times weekly      . HYDROcodone-acetaminophen (NORCO/VICODIN) 5-325 MG per tablet Take 2 tablets by mouth every 4 (four) hours as needed.  10 tablet  0  . Melatonin 3 MG TABS Take 1 tablet by mouth at bedtime as needed (sleep).      . methocarbamol (ROBAXIN) 500 MG tablet Take 1 tablet (500 mg total) by mouth at bedtime as needed for muscle spasms.  30 tablet  5  . naproxen sodium (ANAPROX) 220 MG tablet Take 220 mg by mouth daily as needed (pain).       . traMADol (ULTRAM) 50 MG tablet Take 50 mg by mouth every 6 (six) hours as needed for moderate pain.      . carbamazepine (TEGRETOL) 200 MG tablet Take 0.5 tablets (100 mg total) by mouth 2 (two) times daily.  14 tablet  0  . predniSONE (DELTASONE) 10 MG tablet 3 tabs x3 days and then 2 tabs x3 days and then 1 tab x3 days.  Take w/ food.  18 tablet  0   No current facility-administered medications on file prior to visit.    ALLERGIES: Allergies  Allergen Reactions  . Advair Diskus [Fluticasone-Salmeterol] Cough    excessive  . Erythromycin Diarrhea and Nausea And Vomiting  . Percocet [Oxycodone-Acetaminophen] Other (See Comments)    hallucinations    FAMILY HISTORY: Family History  Problem Relation Age of Onset  . Hyperlipidemia Mother   . Arthritis Mother   . Hypertension Father   . Heart disease Maternal Grandmother   . Diabetes Maternal Grandmother   . Cancer Maternal Grandfather     colon  . Heart disease Maternal Grandfather   . Cancer Paternal Grandmother     lung  . Diabetes Paternal Grandmother     SOCIAL HISTORY: History   Social History  . Marital Status: Married    Spouse Name: N/A    Number of Children: N/A  . Years of Education: N/A   Occupational History  . Not on file.   Social History Main Topics  . Smoking status: Never Smoker   . Smokeless tobacco: Not on file  . Alcohol Use: No  .  Drug Use: No  . Sexual Activity: Not on file   Other Topics Concern  . Not on file   Social History Narrative  . No narrative on file    REVIEW OF SYSTEMS: Constitutional: No fevers, chills, or sweats, no generalized fatigue, change in appetite Eyes: No visual changes, double vision, eye pain Ear, nose and throat: No hearing loss, ear pain, nasal congestion, sore throat Cardiovascular: No chest pain, palpitations Respiratory:  No shortness of breath at rest or with exertion, wheezes GastrointestinaI: No nausea, vomiting, diarrhea, abdominal pain, fecal incontinence Genitourinary:  No dysuria, urinary retention or frequency Musculoskeletal:  No neck pain, back pain Integumentary: No rash, pruritus, skin lesions Neurological: as above Psychiatric: No depression, insomnia, anxiety Endocrine: No palpitations,  fatigue, diaphoresis, mood swings, change in appetite, change in weight, increased thirst Hematologic/Lymphatic:  No anemia, purpura, petechiae. Allergic/Immunologic: no itchy/runny eyes, nasal congestion, recent allergic reactions, rashes  PHYSICAL EXAM: Filed Vitals:   02/05/14 1303  BP: 120/78  Pulse: 75  Resp: 16   General: No acute distress Head:  Normocephalic/atraumatic. Suboccipital tenderness to palpation at the left occipital notch. Neck: supple, no paraspinal tenderness, mild left-sided tenderness of the left trapezius., full range of motion Back: No paraspinal tenderness Heart: regular rate and rhythm Lungs: Clear to auscultation bilaterally. Vascular: No carotid bruits. Neurological Exam: Mental status: alert and oriented to person, place, and time, recent and remote memory intact, fund of knowledge intact, attention and concentration intact, speech fluent and not dysarthric, language intact. Cranial nerves: CN I: not tested CN II: pupils equal, round and reactive to light, visual fields intact, fundi unremarkable, without vessel changes, exudates,  hemorrhages or papilledema. CN III, IV, VI:  full range of motion, no nystagmus, no ptosis CN V: facial sensation intact CN VII: upper and lower face symmetric CN VIII: hearing intact CN IX, X: gag intact, uvula midline CN XI: sternocleidomastoid and trapezius muscles intact CN XII: tongue midline Bulk & Tone: normal, no fasciculations. Motor: 5 minus right triceps and wrist flexion. Otherwise, 5 out of 5 throughout. Sensation: Endorses reduced pinprick sensation in the second and third digits of her right hand. Vibration sensation intact. Deep Tendon Reflexes: 2+ throughout, toes downgoing Finger to nose testing: No dysmetria Heel to shin: No dysmetria Gait: Normal station and stride. Able to turn and walk in tandem. Romberg negative.  IMPRESSION: Left occipital neuralgia.  No structural etiology from the upper cervical spine to contribute to this Cervicogenic headache.  I do think the neck is playing a role in the headache.  Typically spondylitic changes in the upper cervical spine would contribute to this, however a generator from the mid cervical region may occur.  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 Cervical radiculopathy.  She does have mild right upper extremity weakness, although the stenosis is worse on the left.  PLAN: 1.  Left occipital nerve block given today. 2.  Refer to PT/OT to address neck pain and right upper extremity weakness. 3.  Follow up in 6 weeks.  If no improvement, consider starting gabapentin or amitriptyline/nortriptyline.   Thank you for allowing me to take part in the care of this patient.  Metta Clines, DO  CC:  Eduard Clos, MD

## 2014-02-05 NOTE — Procedures (Signed)
Procedure: Occipital nerve block  Procedure risks, benefits and alternative treatments explained to patient and they agree to proceed.   A concentration of 0.25% bupivacaine (3 ml) was mixed with 40 mg of Kenalog (1 ml). A 27 gauge needle was used for injection. The region of the left greater occipital nerve was located by palpation. The area was prepped with alcohol. A total of 2 cc of the above mixture was injected without difficulty. The patient tolerated the procedure well.    Slayton Lubitz R. Cortlyn Cannell, DO  

## 2014-02-21 ENCOUNTER — Ambulatory Visit: Payer: 59 | Attending: Neurology | Admitting: Physical Therapy

## 2014-02-21 DIAGNOSIS — R29898 Other symptoms and signs involving the musculoskeletal system: Secondary | ICD-10-CM | POA: Diagnosis not present

## 2014-02-21 DIAGNOSIS — M542 Cervicalgia: Secondary | ICD-10-CM | POA: Diagnosis not present

## 2014-02-21 DIAGNOSIS — R293 Abnormal posture: Secondary | ICD-10-CM | POA: Insufficient documentation

## 2014-02-21 DIAGNOSIS — M5090 Cervical disc disorder, unspecified, unspecified cervical region: Secondary | ICD-10-CM | POA: Diagnosis not present

## 2014-02-21 DIAGNOSIS — R51 Headache: Secondary | ICD-10-CM | POA: Insufficient documentation

## 2014-02-21 DIAGNOSIS — J45909 Unspecified asthma, uncomplicated: Secondary | ICD-10-CM | POA: Insufficient documentation

## 2014-02-21 DIAGNOSIS — IMO0001 Reserved for inherently not codable concepts without codable children: Secondary | ICD-10-CM | POA: Diagnosis present

## 2014-02-21 DIAGNOSIS — M6281 Muscle weakness (generalized): Secondary | ICD-10-CM | POA: Insufficient documentation

## 2014-02-21 DIAGNOSIS — M531 Cervicobrachial syndrome: Secondary | ICD-10-CM | POA: Diagnosis not present

## 2014-02-26 ENCOUNTER — Ambulatory Visit: Payer: 59 | Admitting: Physical Therapy

## 2014-02-26 ENCOUNTER — Ambulatory Visit: Payer: 59 | Admitting: Occupational Therapy

## 2014-02-26 DIAGNOSIS — IMO0001 Reserved for inherently not codable concepts without codable children: Secondary | ICD-10-CM | POA: Diagnosis not present

## 2014-03-05 ENCOUNTER — Ambulatory Visit: Payer: 59 | Attending: Neurology | Admitting: Physical Therapy

## 2014-03-05 DIAGNOSIS — J45909 Unspecified asthma, uncomplicated: Secondary | ICD-10-CM | POA: Diagnosis not present

## 2014-03-05 DIAGNOSIS — M5090 Cervical disc disorder, unspecified, unspecified cervical region: Secondary | ICD-10-CM | POA: Diagnosis not present

## 2014-03-05 DIAGNOSIS — R51 Headache: Secondary | ICD-10-CM | POA: Diagnosis not present

## 2014-03-05 DIAGNOSIS — M6281 Muscle weakness (generalized): Secondary | ICD-10-CM | POA: Diagnosis not present

## 2014-03-05 DIAGNOSIS — M542 Cervicalgia: Secondary | ICD-10-CM | POA: Diagnosis not present

## 2014-03-05 DIAGNOSIS — M531 Cervicobrachial syndrome: Secondary | ICD-10-CM | POA: Diagnosis not present

## 2014-03-05 DIAGNOSIS — R293 Abnormal posture: Secondary | ICD-10-CM | POA: Diagnosis not present

## 2014-03-05 DIAGNOSIS — R29898 Other symptoms and signs involving the musculoskeletal system: Secondary | ICD-10-CM | POA: Insufficient documentation

## 2014-03-05 DIAGNOSIS — IMO0001 Reserved for inherently not codable concepts without codable children: Secondary | ICD-10-CM | POA: Diagnosis present

## 2014-03-12 ENCOUNTER — Ambulatory Visit: Payer: 59 | Admitting: Physical Therapy

## 2014-03-12 DIAGNOSIS — IMO0001 Reserved for inherently not codable concepts without codable children: Secondary | ICD-10-CM | POA: Diagnosis not present

## 2014-03-19 ENCOUNTER — Ambulatory Visit: Payer: 59 | Admitting: Physical Therapy

## 2014-03-19 DIAGNOSIS — IMO0001 Reserved for inherently not codable concepts without codable children: Secondary | ICD-10-CM | POA: Diagnosis not present

## 2014-03-26 ENCOUNTER — Encounter: Payer: Self-pay | Admitting: Neurology

## 2014-03-26 ENCOUNTER — Ambulatory Visit (INDEPENDENT_AMBULATORY_CARE_PROVIDER_SITE_OTHER): Payer: 59 | Admitting: Neurology

## 2014-03-26 VITALS — BP 126/70 | HR 76 | Temp 98.8°F | Resp 18 | Wt 245.5 lb

## 2014-03-26 DIAGNOSIS — G4486 Cervicogenic headache: Secondary | ICD-10-CM

## 2014-03-26 DIAGNOSIS — R51 Headache: Secondary | ICD-10-CM

## 2014-03-26 DIAGNOSIS — M5412 Radiculopathy, cervical region: Secondary | ICD-10-CM

## 2014-03-26 NOTE — Patient Instructions (Signed)
Let's monitor the arm strength for now Okay to use light weights on the machine Follow up in 3 months.

## 2014-03-26 NOTE — Progress Notes (Signed)
NEUROLOGY FOLLOW UP OFFICE NOTE  Macayla Ekdahl [id number]  HISTORY OF PRESENT ILLNESS: Nancy [name] is a 48 year old right-handed woman with history of bradycardia, asthma, neck pan and migraines who follows up for left sided cervicogenic headaches and cervical radiculopathy.  UPDATE: Last visit, she received a left occipital nerve block and was prescribed PT/OT for neck pain and right upper extremity weakness.  The nerve block resolved the headache.  She went to PT/OT which didn't really improve strength in the right arm.  The arm weakness isn't severe at all, and it doesn't cause her any handicap.  It is very mild.  She denies any pain in the arms.  She briefly noticed numbness in the fingers in the right hand after working out with the therapist once, which has since resolved.  HISTORY: She began having bilateral neck and shoulder pain in January. She then began noticing weakness in her right arm and hand. She began dropping objects. She denied any numbness or tingling.  An MRI CERVICAL SPINE WO CONTRAST was performed on 11/29/13, which showed cervical spondylotic changes from with C5-6 broad-based disc osteophyte causing spinal stenosis and mild cord flattening greater on the left, and C6-7 lobulated broad-based disc osteophyte complex causing spinal stenosis and cord flattening greater on the left.  She was symptomatic on the right, however the MRI revealed greater stenosis on the left side. She was given Tramadol and Robaxin for pain. The neck pain seemed to resolve by the time she followed up with neurosurgery in June.  The neurosurgeon felt that conservative management was appropriate at this time. A couple days later, she woke up with left sided occipital head pain. She noted a constant aching with fairly constant stabbing and shooting pain. There was no associated numbness or tingling. Intensity was a 10 out of 10. She did not note any neck pain. She went to the ED where she received an  occipital nerve block, which only helped for about 10 minutes. She was started on Tegretol but has since discontinued it as it caused nausea and grogginess. The stabbing and shooting pain has improved, although it hasn't completely resolved. She still wakes up with a daily headache. It is on the top of her head and is described as a dull ache. It's about a 2/10 in intensity. She also started experiencing neck and shoulder pain again, although not yet severe. She says that some movement of her neck does slightly increase intensity of the head pain. She really hasn't taken any pain medications. The weakness in the arm is minimal now.  She does have a history of migraine, associated with visual symptoms and nausea. Her migraines are typically right-sided. They are well controlled and she hasn't had one in some months.  PAST MEDICAL HISTORY: Past Medical History  Diagnosis Date  . Bradycardia   . Asthma   . Shortness of breath     with bradycardia  . Dysrhythmia     occ. palpitations  . Headache(784.0)   . PONV (postoperative nausea and vomiting)   . Chicken pox   . Migraines   . Neck pain     bulging disk    MEDICATIONS: Current Outpatient Prescriptions on File Prior to Visit  Medication Sig Dispense Refill  . albuterol (PROVENTIL HFA;VENTOLIN HFA) 108 (90 BASE) MCG/ACT inhaler Inhale 2 puffs into the lungs every 6 (six) hours as needed for wheezing or shortness of breath (rescue).  1 Inhaler  5  . aspirin-acetaminophen-caffeine ([place]) [phone]  MG per tablet Take 2 tablets by mouth every 6 (six) hours as needed (migraine).      Marland Kitchen estradiol (ESTRACE) 0.1 MG/GM vaginal cream Place 0.5 g vaginally See admin instructions. 2-3 times weekly      . HYDROcodone-acetaminophen (NORCO/VICODIN) 5-325 MG per tablet Take 2 tablets by mouth every 4 (four) hours as needed.  10 tablet  0  . methocarbamol (ROBAXIN) 500 MG tablet Take 1 tablet (500 mg total) by mouth at bedtime as needed for  muscle spasms.  30 tablet  5  . naproxen sodium (ANAPROX) 220 MG tablet Take 220 mg by mouth daily as needed (pain).       . traMADol (ULTRAM) 50 MG tablet Take 50 mg by mouth every 6 (six) hours as needed for moderate pain.      . carbamazepine (TEGRETOL) 200 MG tablet Take 0.5 tablets (100 mg total) by mouth 2 (two) times daily.  14 tablet  0  . Melatonin 3 MG TABS Take 1 tablet by mouth at bedtime as needed (sleep).      . predniSONE (DELTASONE) 10 MG tablet 3 tabs x3 days and then 2 tabs x3 days and then 1 tab x3 days.  Take w/ food.  18 tablet  0   No current facility-administered medications on file prior to visit.    ALLERGIES: Allergies  Allergen Reactions  . Advair Diskus [Fluticasone-Salmeterol] Cough    excessive  . Erythromycin Diarrhea and Nausea And Vomiting  . Percocet [Oxycodone-Acetaminophen] Other (See Comments)    hallucinations    FAMILY HISTORY: Family History  Problem Relation Age of Onset  . Hyperlipidemia Mother   . Arthritis Mother   . Hypertension Father   . Heart disease Maternal Grandmother   . Diabetes Maternal Grandmother   . Cancer Maternal Grandfather     colon  . Heart disease Maternal Grandfather   . Cancer Paternal Grandmother     lung  . Diabetes Paternal Grandmother     SOCIAL HISTORY: History   Social History  . Marital Status: Married    Spouse Name: N/A    Number of Children: N/A  . Years of Education: N/A   Occupational History  . Not on file.   Social History Main Topics  . Smoking status: Never Smoker   . Smokeless tobacco: Not on file  . Alcohol Use: No  . Drug Use: Yes  . Sexual Activity: Yes    Partners: Male   Other Topics Concern  . Not on file   Social History Narrative  . No narrative on file    REVIEW OF SYSTEMS: Constitutional: No fevers, chills, or sweats, no generalized fatigue, change in appetite Eyes: No visual changes, double vision, eye pain Ear, nose and throat: No hearing loss, ear pain,  nasal congestion, sore throat Cardiovascular: No chest pain, palpitations Respiratory:  No shortness of breath at rest or with exertion, wheezes GastrointestinaI: No nausea, vomiting, diarrhea, abdominal pain, fecal incontinence Genitourinary:  No dysuria, urinary retention or frequency Musculoskeletal:  No neck pain, back pain Integumentary: No rash, pruritus, skin lesions Neurological: as above Psychiatric: No depression, insomnia, anxiety Endocrine: No palpitations, fatigue, diaphoresis, mood swings, change in appetite, change in weight, increased thirst Hematologic/Lymphatic:  No anemia, purpura, petechiae. Allergic/Immunologic: no itchy/runny eyes, nasal congestion, recent allergic reactions, rashes  PHYSICAL EXAM: Filed Vitals:   03/26/14 1420  BP: 126/70  Pulse: 76  Temp: 98.8 F (37.1 C)  Resp: 18   General: No acute distress Head:  Normocephalic/atraumatic Neck: supple, no paraspinal tenderness, full range of motion Heart:  Regular rate and rhythm Lungs:  Clear to auscultation bilaterally Back: No paraspinal tenderness Neurological Exam: alert and oriented to person, place, and time. Attention span and concentration intact, recent and remote memory intact, fund of knowledge intact.  Speech fluent and not dysarthric, language intact.  CN II-XII intact. Fundoscopic exam unremarkable without vessel changes, exudates, hemorrhages or papilledema.  Bulk and tone normal, muscle strength 5-/5 in both triceps, otherwise 5/5 throughout.  Sensation to pinprick reduced in the first 3 fingers of the right hand.  Vibration intact.  Deep tendon reflexes 2+ throughout, toes downgoing.  Finger to nose testing intact.  Gait normal.  IMPRESSION: 1.  Cervicogenic headache, resolved 2.  Cervical radiculopathy.  I suspect that her symptoms are related to the disc osteophyte in the cervical region.  Although it is worse on the left, it still affects the right side as well.  This could explain the  trace triceps weakness in both arms, as well as the numbness in the right hand.  PLAN: 1.  Will monitor for now.  If she notes progressed weakness, we would have to possibly re-image the cervical spine and/or refer back to neurosurgery for another evaluation. 2.  Follow up in 3 months for re-evaluation.  15 minutes spent with patient, over 50% spent discussing etiology and coordinating care.  Metta Clines, DO  CC:  Eduard Clos, MD

## 2014-05-14 ENCOUNTER — Ambulatory Visit (INDEPENDENT_AMBULATORY_CARE_PROVIDER_SITE_OTHER): Payer: 59 | Admitting: Family Medicine

## 2014-05-14 ENCOUNTER — Encounter: Payer: Self-pay | Admitting: Family Medicine

## 2014-05-14 VITALS — BP 128/82 | HR 60 | Temp 97.5°F | Wt 245.0 lb

## 2014-05-14 DIAGNOSIS — R600 Localized edema: Secondary | ICD-10-CM

## 2014-05-14 DIAGNOSIS — R319 Hematuria, unspecified: Secondary | ICD-10-CM

## 2014-05-14 LAB — POCT URINALYSIS DIPSTICK
Bilirubin, UA: NEGATIVE
Glucose, UA: NEGATIVE
KETONES UA: NEGATIVE
Leukocytes, UA: NEGATIVE
Nitrite, UA: NEGATIVE
Protein, UA: NEGATIVE
Spec Grav, UA: 1.015
UROBILINOGEN UA: 0.2
pH, UA: 5.5

## 2014-05-14 LAB — URINALYSIS, MICROSCOPIC ONLY

## 2014-05-14 MED ORDER — HYDROCHLOROTHIAZIDE 25 MG PO TABS: ORAL_TABLET | ORAL | Status: DC

## 2014-05-14 NOTE — Progress Notes (Signed)
   Subjective:    Patient ID: Nancy [name], female    DOB: Aug 03, 1965, 48 y.o.   MRN: [id number]  HPI  Bilateral leg and ankle edema. She was at the beach last week and in the but had symptoms even prior to that. Duration about one and one half weeks. Takes occasional Aleve but no regular nonsteroidals. She is currently not taking any regular prescription medications. No dyspnea. No orthopnea. No dietary changes.  Past Medical History  Diagnosis Date  . Bradycardia   . Asthma   . Shortness of breath     with bradycardia  . Dysrhythmia     occ. palpitations  . Headache(784.0)   . PONV (postoperative nausea and vomiting)   . Chicken pox   . Migraines   . Neck pain     bulging disk   Past Surgical History  Procedure Laterality Date  . Cesarean section      1992 and 1995  . Tubal ligation      1995  . Vaginal hysterectomy      1998 or 1997  . Bladder surgery  July 10th 2013  . Diagnostic laparoscopy    . Laparoscopy Bilateral 05/31/2013    Procedure: LAPAROSCOPY withbilateral salpingo-oophorectomy, lysis of adhesions;  Surgeon: Allena Katz, MD;  Location: [facility];  Service: Gynecology;  Laterality: Bilateral;  with clippings of Vaginal  mesh  . Salpingoophorectomy Bilateral 05/31/2013    Procedure: BILATERAL SALPINGO OOPHORECTOMY/TRIM MESH;  Surgeon: Allena Katz, MD;  Location: [facility];  Service: Gynecology;  Laterality: Bilateral;  . Wrist fracture surgery  2005    reports that she has never smoked. She does not have any smokeless tobacco history on file. She reports that she uses illicit drugs. She reports that she does not drink alcohol. family history includes Arthritis in her mother; Cancer in her maternal grandfather and paternal grandmother; Diabetes in her maternal grandmother and paternal grandmother; Heart disease in her maternal grandfather and maternal grandmother; Hyperlipidemia in her mother; Hypertension in her father. Allergies  Allergen Reactions    . Advair Diskus [Fluticasone-Salmeterol] Cough    excessive  . Erythromycin Diarrhea and Nausea And Vomiting  . Percocet [Oxycodone-Acetaminophen] Other (See Comments)    hallucinations      Review of Systems  Constitutional: Negative for fever and chills.  Respiratory: Negative for cough and shortness of breath.   Cardiovascular: Positive for leg swelling. Negative for chest pain and palpitations.       Objective:   Physical Exam  Constitutional: She appears well-developed and well-nourished.  Neck: Neck supple. No thyromegaly present.  Cardiovascular: Normal rate and regular rhythm.  Exam reveals no gallop.   No murmur heard. Pulmonary/Chest: Effort normal and breath sounds normal. No respiratory distress. She has no wheezes. She has no rales.  Musculoskeletal: She exhibits edema.  She has nonpitting bilateral leg edema  Lymphadenopathy:    She has no cervical adenopathy.          Assessment & Plan:  Bilateral leg edema. Question venous stasis and exacerbated by increased sun exposure. Check basic labs. Consider short-term use only HCTZ 25 mg daily and stay well-hydrated.

## 2014-05-14 NOTE — Progress Notes (Signed)
Pre visit review using our clinic review tool, if applicable. No additional management support is needed unless otherwise documented below in the visit note. 

## 2014-05-15 LAB — BASIC METABOLIC PANEL
BUN: 15 mg/dL (ref 6–23)
CALCIUM: 9.2 mg/dL (ref 8.4–10.5)
CO2: 32 mEq/L (ref 19–32)
Chloride: 99 mEq/L (ref 96–112)
Creatinine, Ser: 0.8 mg/dL (ref 0.4–1.2)
GFR: 82.84 mL/min (ref >60.00)
Glucose, Bld: 79 mg/dL (ref 70–99)
Potassium: 4.2 mEq/L (ref 3.5–5.1)
SODIUM: 137 meq/L (ref 135–145)

## 2014-05-15 LAB — HEPATIC FUNCTION PANEL
ALT: 21 U/L (ref 0–35)
AST: 25 U/L (ref 0–37)
Albumin: 3.6 g/dL (ref 3.5–5.2)
Alkaline Phosphatase: 90 U/L (ref 39–117)
BILIRUBIN TOTAL: 0.3 mg/dL (ref 0.2–1.2)
Bilirubin, Direct: 0.1 mg/dL (ref 0.0–0.3)
TOTAL PROTEIN: 8 g/dL (ref 6.0–8.3)

## 2014-05-15 LAB — TSH: TSH: 1.47 u[IU]/mL (ref 0.35–4.50)

## 2014-05-21 ENCOUNTER — Ambulatory Visit: Payer: 59 | Admitting: Neurology

## 2014-06-04 ENCOUNTER — Ambulatory Visit: Payer: 59 | Admitting: Neurology

## 2014-06-04 ENCOUNTER — Telehealth: Payer: Self-pay | Admitting: Neurology

## 2014-06-04 NOTE — Telephone Encounter (Signed)
Pt called today at 8:12 to resch her appt from 06-04-14 to 06-06-14

## 2014-06-06 ENCOUNTER — Encounter: Payer: Self-pay | Admitting: Neurology

## 2014-06-06 ENCOUNTER — Other Ambulatory Visit: Payer: Self-pay | Admitting: Neurology

## 2014-06-06 ENCOUNTER — Ambulatory Visit (INDEPENDENT_AMBULATORY_CARE_PROVIDER_SITE_OTHER): Payer: 59 | Admitting: Neurology

## 2014-06-06 VITALS — BP 130/78 | HR 68 | Temp 97.5°F | Resp 18 | Wt 243.0 lb

## 2014-06-06 DIAGNOSIS — G4486 Cervicogenic headache: Secondary | ICD-10-CM

## 2014-06-06 DIAGNOSIS — R51 Headache: Secondary | ICD-10-CM

## 2014-06-06 DIAGNOSIS — R52 Pain, unspecified: Secondary | ICD-10-CM

## 2014-06-06 DIAGNOSIS — M5412 Radiculopathy, cervical region: Secondary | ICD-10-CM

## 2014-06-06 MED ORDER — NORTRIPTYLINE HCL 10 MG PO CAPS: 10.0000 mg | ORAL_CAPSULE | Freq: Every day | ORAL | Status: DC

## 2014-06-06 NOTE — Patient Instructions (Signed)
1.  Start amitriptyline 18m at bedtime.  Call in 4 weeks with update.  It can address the headache, neck pain and generalized body pain. 2.  Limit use of pain relievers (excedrin) to no more than 2 days out of the week.  Try to avoid tramadol if possible (due to very rare possibility of serotonin syndrome) 3.  Check ANA, ESR, RF, TSH, B12 4.  Follow up in 3 months.

## 2014-06-06 NOTE — Progress Notes (Signed)
NEUROLOGY FOLLOW UP OFFICE NOTE  Nancy [name] [id number]  HISTORY OF PRESENT ILLNESS: Nancy [name] is a 48 year old right-handed woman with history of bradycardia, asthma, neck pan and migraines who follows up for left sided cervicogenic headaches and cervical radiculopathy.  UPDATE: She is scheduled to have cervical epidural injection.  She denies any weakness in the arms or hands.  Over the past month, she has had a recurrence of headache, occuring mostly in back of left side of head, but also on the right side and around the top of her head.  It is a mild ache, about 4/10.  She takes Excedrin which helps.  She also reports new symptoms to me, specifically generalized pain.  For several years, she has history of aching pain and tenderness to touch involving any part of her body.  There is no joint swelling or redness.  There is no numbness, tingling or weakness.  Over the past few weeks, she developed swelling in her feet and ankles.  She was prescribed a brief course of HCTZ, which helped.  But the ankles swell again when she is off of it.  She denies pain or numbness.  Her PCP checked a Sed Rate in April, which was 16.  She reports that her mother has history of PMR and fibromyalgia.  HISTORY: She began having bilateral neck and shoulder pain in January. She then began noticing weakness in her right arm and hand. She began dropping objects. She denied any numbness or tingling.  An MRI CERVICAL SPINE WO CONTRAST was performed on 11/29/13, which showed cervical spondylotic changes from with C5-6 broad-based disc osteophyte causing spinal stenosis and mild cord flattening greater on the left, and C6-7 lobulated broad-based disc osteophyte complex causing spinal stenosis and cord flattening greater on the left.  She was symptomatic on the right, however the MRI revealed greater stenosis on the left side. She was given Tramadol and Robaxin for pain. The neck pain seemed to resolve by the time she  followed up with neurosurgery in June. The neurosurgeon felt that conservative management was appropriate at this time. A couple days later, she woke up with left sided occipital head pain. She noted a constant aching with fairly constant stabbing and shooting pain. There was no associated numbness or tingling. Intensity was a 10 out of 10. She did not note any neck pain. She went to the ED where she received an occipital nerve block, which only helped for about 10 minutes. She was started on Tegretol but has since discontinued it as it caused nausea and grogginess. The stabbing and shooting pain has improved, although it hasn't completely resolved. She still wakes up with a daily headache. It is on the top of her head and is described as a dull ache. It's about a 2/10 in intensity. She also started experiencing neck and shoulder pain again, although not yet severe. She says that some movement of her neck does slightly increase intensity of the head pain.   She received a left occipital nerve block and was prescribed PT/OT for neck pain and right upper extremity weakness.  The nerve block resolved the headache.  She went to PT/OT which didn't really improve strength in the right arm.  The arm weakness isn't severe at all, and it doesn't cause her any handicap.  It is very mild.  She denies any pain in the arms.  She briefly noticed numbness in the fingers in the right hand after working out with the  therapist once, which has since resolved.  Prior exam from August 2015 revealed trace weakness in both triceps, as well as reduced pinprick sensation in the first 3 digits of the right hand.  She does have a history of migraine, associated with visual symptoms and nausea. Her migraines are typically right-sided. They are well controlled and she hasn't had one in some months.  PAST MEDICAL HISTORY: Past Medical History  Diagnosis Date  . Bradycardia   . Asthma   . Shortness of breath     with bradycardia  .  Dysrhythmia     occ. palpitations  . Headache(784.0)   . PONV (postoperative nausea and vomiting)   . Chicken pox   . Migraines   . Neck pain     bulging disk    MEDICATIONS: Current Outpatient Prescriptions on File Prior to Visit  Medication Sig Dispense Refill  . albuterol (PROVENTIL HFA;VENTOLIN HFA) 108 (90 BASE) MCG/ACT inhaler Inhale 2 puffs into the lungs every 6 (six) hours as needed for wheezing or shortness of breath (rescue). 1 Inhaler 5  . hydrochlorothiazide (HYDRODIURIL) 25 MG tablet Take one  Daily prn edema 30 tablet 3  . HYDROcodone-acetaminophen (NORCO/VICODIN) 5-325 MG per tablet Take 2 tablets by mouth every 4 (four) hours as needed. 10 tablet 0  . Melatonin 3 MG TABS Take 1 tablet by mouth at bedtime as needed (sleep).    . methocarbamol (ROBAXIN) 500 MG tablet Take 1 tablet (500 mg total) by mouth at bedtime as needed for muscle spasms. 30 tablet 5  . naproxen sodium (ANAPROX) 220 MG tablet Take 220 mg by mouth daily as needed (pain).     . traMADol (ULTRAM) 50 MG tablet Take 50 mg by mouth every 6 (six) hours as needed for moderate pain.     No current facility-administered medications on file prior to visit.    ALLERGIES: Allergies  Allergen Reactions  . Advair Diskus [Fluticasone-Salmeterol] Cough    excessive  . Erythromycin Diarrhea and Nausea And Vomiting  . Percocet [Oxycodone-Acetaminophen] Other (See Comments)    hallucinations    FAMILY HISTORY: Family History  Problem Relation Age of Onset  . Hyperlipidemia Mother   . Arthritis Mother   . Hypertension Father   . Heart disease Maternal Grandmother   . Diabetes Maternal Grandmother   . Cancer Maternal Grandfather     colon  . Heart disease Maternal Grandfather   . Cancer Paternal Grandmother     lung  . Diabetes Paternal Grandmother     SOCIAL HISTORY: History   Social History  . Marital Status: Married    Spouse Name: N/A    Number of Children: N/A  . Years of Education: N/A    Occupational History  . Not on file.   Social History Main Topics  . Smoking status: Never Smoker   . Smokeless tobacco: Not on file  . Alcohol Use: No  . Drug Use: No  . Sexual Activity:    Partners: Male   Other Topics Concern  . Not on file   Social History Narrative    REVIEW OF SYSTEMS: Constitutional: No fevers, chills, or sweats, no generalized fatigue, change in appetite Eyes: No visual changes, double vision, eye pain Ear, nose and throat: No hearing loss, ear pain, nasal congestion, sore throat Cardiovascular: No chest pain, palpitations Respiratory:  No shortness of breath at rest or with exertion, wheezes GastrointestinaI: No nausea, vomiting, diarrhea, abdominal pain, fecal incontinence Genitourinary:  No dysuria, urinary retention or  frequency Musculoskeletal:  Generalized pain.  Neck pain. Integumentary: No rash, pruritus, skin lesions.    Neurological: as above Psychiatric: No depression, insomnia, anxiety Endocrine: No palpitations, fatigue, diaphoresis, mood swings, change in appetite, change in weight, increased thirst Hematologic/Lymphatic:  No anemia, purpura, petechiae. Allergic/Immunologic: no itchy/runny eyes, nasal congestion, recent allergic reactions, rashes  PHYSICAL EXAM: Filed Vitals:   06/06/14 0749  BP: 130/78  Pulse: 68  Temp: 97.5 F (36.4 C)  Resp: 18   General: No acute distress Eyes:  Fundoscopic exam unremarkable without vessel changes, exudates, hemorrhages or papilledema.  Head:  Normocephalic/atraumatic Neck: supple, bilateral paraspinal tenderness, full range of motion Heart:  Regular rate and rhythm Lungs:  Clear to auscultation bilaterally Extremities:  Swelling involving both ankles and feet.  No pitting edema.   Back: No paraspinal tenderness She does have multiple tender points. Neurological Exam: alert and oriented to person, place, and time. Attention span and concentration intact, recent and remote memory intact,  fund of knowledge intact.  Speech fluent and not dysarthric, language intact.  CN II-XII intact. Fundoscopic exam unremarkable without vessel changes, exudates, hemorrhages or papilledema.  Bulk and tone normal, muscle strength 5/5 throughout.  Sensation to light touch, temperature and vibration intact.  Deep tendon reflexes 2+ throughout, toes downgoing.  Finger to nose and heel to shin testing intact.  Gait normal, Romberg negative.  IMPRESSION: Cervicogenic headache Cervical radiculopathy Generalized body pain.  PLAN: 1.  Start amitriptyline 10mg  at bedtime.  Call in 4 weeks with update.  It can address the headache, neck pain and generalized body pain. 2.  Limit use of pain relievers (excedrin) to no more than 2 days out of the week.  Try to avoid tramadol if possible (due to very rare possibility of serotonin syndrome) 3.  Check ANA, ESR, RF, TSH, B12 4.  Will have epidural injection 5.  Follow up in 3 months.  Metta Clines, DO  CC:  Eduard Clos, MD

## 2014-06-07 LAB — RHEUMATOID FACTOR: Rhuematoid fact SerPl-aCnc: <10 IU/mL (ref <=14)

## 2014-06-07 LAB — SEDIMENTATION RATE: Sed Rate: 42 mm/hr — ABNORMAL HIGH (ref 0–22)

## 2014-06-07 LAB — VITAMIN B12: Vitamin B-12: 395 pg/mL (ref 211–911)

## 2014-06-07 LAB — TSH: TSH: 1.435 u[IU]/mL (ref 0.350–4.500)

## 2014-06-09 ENCOUNTER — Telehealth: Payer: Self-pay | Admitting: *Deleted

## 2014-06-09 LAB — ANA: Anti Nuclear Antibody(ANA): NEGATIVE

## 2014-06-09 NOTE — Telephone Encounter (Signed)
-----   Message from Dudley Major, DO sent at 06/09/2014 11:44 AM EST ----- Labs are normal, except for the sed rate which is just a little elevated (as it had been 6 months ago) but does not seem clinically relevant.  She may want to consider seeing a rheumatologist for the generalized pain. ----- Message -----    From: Lab in [facility] Interface    Sent: 06/09/2014  10:33 AM      To: Dudley Major, DO

## 2014-06-09 NOTE — Telephone Encounter (Signed)
Patient is aware of lab reuslts

## 2014-07-02 ENCOUNTER — Ambulatory Visit: Payer: 59 | Admitting: Neurology

## 2014-08-18 ENCOUNTER — Other Ambulatory Visit: Payer: Self-pay

## 2014-08-18 DIAGNOSIS — Z1231 Encounter for screening mammogram for malignant neoplasm of breast: Secondary | ICD-10-CM

## 2014-08-27 ENCOUNTER — Ambulatory Visit: Payer: 59

## 2014-09-02 ENCOUNTER — Telehealth: Payer: Self-pay

## 2014-09-02 ENCOUNTER — Other Ambulatory Visit: Payer: Self-pay | Admitting: Family Medicine

## 2014-09-02 DIAGNOSIS — M47812 Spondylosis without myelopathy or radiculopathy, cervical region: Secondary | ICD-10-CM

## 2014-09-02 NOTE — Telephone Encounter (Signed)
Per Dr. Jacinto Reap set up a referral for patient to see Dr. Ouida Sills

## 2014-09-10 ENCOUNTER — Ambulatory Visit (INDEPENDENT_AMBULATORY_CARE_PROVIDER_SITE_OTHER): Payer: 59 | Admitting: Neurology

## 2014-09-10 ENCOUNTER — Encounter: Payer: Self-pay | Admitting: Neurology

## 2014-09-10 VITALS — BP 134/70 | HR 68 | Temp 98.0°F | Resp 18 | Ht 63.0 in | Wt 248.3 lb

## 2014-09-10 DIAGNOSIS — G4486 Cervicogenic headache: Secondary | ICD-10-CM

## 2014-09-10 DIAGNOSIS — R51 Headache: Secondary | ICD-10-CM

## 2014-09-10 NOTE — Patient Instructions (Addendum)
I'm glad your doing better .

## 2014-09-10 NOTE — Progress Notes (Signed)
NEUROLOGY FOLLOW UP OFFICE NOTE  Nancy [name] [id number]  HISTORY OF PRESENT ILLNESS: Nancy [name] is a 49 year old right-handed woman with history of bradycardia, asthma, neck pan and migraines who follows up for left sided cervicogenic headaches and cervical radiculopathy.  UPDATE: She had a cervical epidural injection 11 weeks ago.  She was pain free the next day and continues to do well.  She did not need the nortriptyline.  To evaluate generalized pain, labs were checked, which were unremarkable.  This included TSH 1.435 and B12 395.  Sed Rate was elevated at 42, but ANA and RF were negative.  HISTORY: She began having bilateral neck and shoulder pain in January. She then began noticing weakness in her right arm and hand. She began dropping objects. She denied any numbness or tingling.  An MRI CERVICAL SPINE WO CONTRAST was performed on 11/29/13, which showed cervical spondylotic changes from with C5-6 broad-based disc osteophyte causing spinal stenosis and mild cord flattening greater on the left, and C6-7 lobulated broad-based disc osteophyte complex causing spinal stenosis and cord flattening greater on the left.  She was symptomatic on the right, however the MRI revealed greater stenosis on the left side. She was given Tramadol and Robaxin for pain. The neck pain seemed to resolve by the time she followed up with neurosurgery in June. The neurosurgeon felt that conservative management was appropriate at this time. A couple days later, she woke up with left sided occipital head pain. She noted a constant aching with fairly constant stabbing and shooting pain. There was no associated numbness or tingling. Intensity was a 10 out of 10. She did not note any neck pain. She went to the ED where she received an occipital nerve block, which only helped for about 10 minutes. She was started on Tegretol but has since discontinued it as it caused nausea and grogginess. The stabbing and shooting pain  has improved, although it hasn't completely resolved. She still wakes up with a daily headache. It is on the top of her head and is described as a dull ache. It's about a 2/10 in intensity. She also started experiencing neck and shoulder pain again, although not yet severe. She says that some movement of her neck does slightly increase intensity of the head pain.   She received a left occipital nerve block and was prescribed PT/OT for neck pain and right upper extremity weakness.  The nerve block resolved the headache.  She went to PT/OT which didn't really improve strength in the right arm.  The arm weakness isn't severe at all, and it doesn't cause her any handicap.  It is very mild.  She denies any pain in the arms.  She briefly noticed numbness in the fingers in the right hand after working out with the therapist once, which has since resolved.  Prior exam from August 2015 revealed trace weakness in both triceps, as well as reduced pinprick sensation in the first 3 digits of the right hand.  She does have a history of migraine, associated with visual symptoms and nausea. Her migraines are typically right-sided. They are well controlled and she hasn't had one in some months.  For several years, she has history of aching pain and tenderness to touch involving any part of her body.  There is no joint swelling or redness.  There is no numbness, tingling or weakness.  Over the past few weeks, she developed swelling in her feet and ankles.  She was prescribed a  brief course of HCTZ, which helped.  But the ankles swell again when she is off of it.  She denies pain or numbness.  Her PCP checked a Sed Rate in April, which was 70.  She reports that her mother has history of PMR and fibromyalgia.  PAST MEDICAL HISTORY: Past Medical History  Diagnosis Date  . Bradycardia   . Asthma   . Shortness of breath     with bradycardia  . Dysrhythmia     occ. palpitations  . Headache(784.0)   . PONV (postoperative  nausea and vomiting)   . Chicken pox   . Migraines   . Neck pain     bulging disk    MEDICATIONS: Current Outpatient Prescriptions on File Prior to Visit  Medication Sig Dispense Refill  . albuterol (PROVENTIL HFA;VENTOLIN HFA) 108 (90 BASE) MCG/ACT inhaler Inhale 2 puffs into the lungs every 6 (six) hours as needed for wheezing or shortness of breath (rescue). 1 Inhaler 5  . hydrochlorothiazide (HYDRODIURIL) 25 MG tablet Take one  Daily prn edema 30 tablet 3  . HYDROcodone-acetaminophen (NORCO/VICODIN) 5-325 MG per tablet Take 2 tablets by mouth every 4 (four) hours as needed. 10 tablet 0  . Melatonin 3 MG TABS Take 1 tablet by mouth at bedtime as needed (sleep).    . methocarbamol (ROBAXIN) 500 MG tablet Take 1 tablet (500 mg total) by mouth at bedtime as needed for muscle spasms. 30 tablet 5  . traMADol (ULTRAM) 50 MG tablet Take 50 mg by mouth every 6 (six) hours as needed for moderate pain.    . naproxen sodium (ANAPROX) 220 MG tablet Take 220 mg by mouth daily as needed (pain).     . nortriptyline (PAMELOR) 10 MG capsule Take 1 capsule (10 mg total) by mouth at bedtime. (Patient not taking: Reported on 09/10/2014) 30 capsule 0   No current facility-administered medications on file prior to visit.    ALLERGIES: Allergies  Allergen Reactions  . Advair Diskus [Fluticasone-Salmeterol] Cough    excessive  . Erythromycin Diarrhea and Nausea And Vomiting  . Percocet [Oxycodone-Acetaminophen] Other (See Comments)    hallucinations    FAMILY HISTORY: Family History  Problem Relation Age of Onset  . Hyperlipidemia Mother   . Arthritis Mother   . Hypertension Father   . Heart disease Maternal Grandmother   . Diabetes Maternal Grandmother   . Cancer Maternal Grandfather     colon  . Heart disease Maternal Grandfather   . Cancer Paternal Grandmother     lung  . Diabetes Paternal Grandmother     SOCIAL HISTORY: History   Social History  . Marital Status: Married    Spouse  Name: N/A  . Number of Children: N/A  . Years of Education: N/A   Occupational History  . Not on file.   Social History Main Topics  . Smoking status: Never Smoker   . Smokeless tobacco: Not on file  . Alcohol Use: No  . Drug Use: No  . Sexual Activity:    Partners: Male   Other Topics Concern  . Not on file   Social History Narrative    REVIEW OF SYSTEMS: Constitutional: No fevers, chills, or sweats, no generalized fatigue, change in appetite Eyes: No visual changes, double vision, eye pain Ear, nose and throat: No hearing loss, ear pain, nasal congestion, sore throat Cardiovascular: No chest pain, palpitations Respiratory:  No shortness of breath at rest or with exertion, wheezes GastrointestinaI: No nausea, vomiting, diarrhea, abdominal pain,  fecal incontinence Genitourinary:  No dysuria, urinary retention or frequency Musculoskeletal:  No neck pain, back pain Integumentary: No rash, pruritus, skin lesions Neurological: as above Psychiatric: No depression, insomnia, anxiety Endocrine: No palpitations, fatigue, diaphoresis, mood swings, change in appetite, change in weight, increased thirst Hematologic/Lymphatic:  No anemia, purpura, petechiae. Allergic/Immunologic: no itchy/runny eyes, nasal congestion, recent allergic reactions, rashes  PHYSICAL EXAM: Filed Vitals:   09/10/14 1458  BP: 134/70  Pulse: 68  Temp: 98 F (36.7 C)  Resp: 18   General: No acute distress Head:  Normocephalic/atraumatic Eyes:  Fundoscopic exam unremarkable without vessel changes, exudates, hemorrhages or papilledema. Neck: supple, no paraspinal tenderness, full range of motion Heart:  Regular rate and rhythm Lungs:  Clear to auscultation bilaterally Back: No paraspinal tenderness Neurological Exam: alert and oriented to person, place, and time. Attention span and concentration intact, recent and remote memory intact, fund of knowledge intact.  Speech fluent and not dysarthric, language  intact.  CN II-XII intact. Fundoscopic exam unremarkable without vessel changes, exudates, hemorrhages or papilledema.  Bulk and tone normal, muscle strength 5/5 throughout.  Sensation to light touch intact.  Deep tendon reflexes 2+ throughout.  Finger to nose testing intact.  Gait normal  IMPRESSION: Cervicogenic headaches, improved  PLAN: If the headaches recur, she may have another epidural.  She may follow up with me as needed.  15 minutes spent with patient, over 50% spent discussing plan of care.  Metta Clines, DO  CC:  Carolann Littler, MD

## 2014-09-17 ENCOUNTER — Encounter (INDEPENDENT_AMBULATORY_CARE_PROVIDER_SITE_OTHER): Payer: Self-pay

## 2014-09-17 ENCOUNTER — Ambulatory Visit
Admission: RE | Admit: 2014-09-17 | Discharge: 2014-09-17 | Disposition: A | Discharge status: Home / Self Care | Payer: 59 | Source: Ambulatory Visit

## 2014-09-17 DIAGNOSIS — Z1231 Encounter for screening mammogram for malignant neoplasm of breast: Secondary | ICD-10-CM

## 2014-10-15 ENCOUNTER — Ambulatory Visit (INDEPENDENT_AMBULATORY_CARE_PROVIDER_SITE_OTHER): Payer: 59 | Admitting: Family Medicine

## 2014-10-15 ENCOUNTER — Encounter: Payer: Self-pay | Admitting: Family Medicine

## 2014-10-15 VITALS — BP 124/70 | HR 71 | Temp 97.8°F | Wt 248.0 lb

## 2014-10-15 DIAGNOSIS — R059 Cough, unspecified: Secondary | ICD-10-CM

## 2014-10-15 DIAGNOSIS — R05 Cough: Secondary | ICD-10-CM

## 2014-10-15 NOTE — Progress Notes (Signed)
Pre visit review using our clinic review tool, if applicable. No additional management support is needed unless otherwise documented below in the visit note. 

## 2014-10-15 NOTE — Progress Notes (Signed)
   Subjective:    Patient ID: Nancy [name], female    DOB: [birth year], 49 y.o.   MRN: [id number]  HPI Patient seen with 4 week history of cough. Cough is been mostly nonproductive and worse at night. She's had occasional headaches. Minimal postnasal drainage. No fevers or chills. She did have some mild fever initially but none since then. No GERD symptoms. No hemoptysis. Nonsmoker. She is currently taking prednisone 15 mg daily per rheumatology. Using albuterol inhaler as needed.  Past Medical History  Diagnosis Date  . Bradycardia   . Asthma   . Shortness of breath     with bradycardia  . Dysrhythmia     occ. palpitations  . Headache(784.0)   . PONV (postoperative nausea and vomiting)   . Chicken pox   . Migraines   . Neck pain     bulging disk   Past Surgical History  Procedure Laterality Date  . Cesarean section      1992 and 1995  . Tubal ligation      1995  . Vaginal hysterectomy      1998 or 1997  . Bladder surgery  July 10th 2013  . Diagnostic laparoscopy    . Laparoscopy Bilateral 05/31/2013    Procedure: LAPAROSCOPY withbilateral salpingo-oophorectomy, lysis of adhesions;  Surgeon: Allena Katz, MD;  Location: [facility];  Service: Gynecology;  Laterality: Bilateral;  with clippings of Vaginal  mesh  . Salpingoophorectomy Bilateral 05/31/2013    Procedure: BILATERAL SALPINGO OOPHORECTOMY/TRIM MESH;  Surgeon: Allena Katz, MD;  Location: [facility];  Service: Gynecology;  Laterality: Bilateral;  . Wrist fracture surgery  2005    reports that she has never smoked. She does not have any smokeless tobacco history on file. She reports that she does not drink alcohol or use illicit drugs. family history includes Arthritis in her mother; Cancer in her maternal grandfather and paternal grandmother; Diabetes in her maternal grandmother and paternal grandmother; Heart disease in her maternal grandfather and maternal grandmother; Hyperlipidemia in her mother; Hypertension in  her father. Allergies  Allergen Reactions  . Advair Diskus [Fluticasone-Salmeterol] Cough    excessive  . Erythromycin Diarrhea and Nausea And Vomiting  . Percocet [Oxycodone-Acetaminophen] Other (See Comments)    hallucinations      Review of Systems  Constitutional: Negative for fever, chills, appetite change and unexpected weight change.  HENT: Negative for postnasal drip.   Respiratory: Positive for cough. Negative for shortness of breath.   Cardiovascular: Negative for chest pain, palpitations and leg swelling.       Objective:   Physical Exam  Constitutional: She appears well-developed and well-nourished.  HENT:  Right Ear: External ear normal.  Left Ear: External ear normal.  Mouth/Throat: Oropharynx is clear and moist.  Minimal cerumen both canals  Neck: Neck supple.  Cardiovascular: Normal rate and regular rhythm.   Pulmonary/Chest: Effort normal and breath sounds normal. No respiratory distress. She has no wheezes. She has no rales.  Musculoskeletal: She exhibits no edema.  Lymphadenopathy:    She has no cervical adenopathy.          Assessment & Plan:  Cough. Suspect resolving viral bronchitis. Nonfocal exam. Give this another 2 weeks. If cough not resolving by then chest x-ray and further evaluation.

## 2014-12-17 ENCOUNTER — Other Ambulatory Visit: Payer: Self-pay

## 2014-12-17 MED ORDER — HYDROCHLOROTHIAZIDE 25 MG PO TABS: ORAL_TABLET | ORAL | Status: DC

## 2015-02-20 ENCOUNTER — Telehealth: Payer: Self-pay

## 2015-02-20 ENCOUNTER — Other Ambulatory Visit: Payer: Self-pay | Admitting: Family Medicine

## 2015-02-20 DIAGNOSIS — R700 Elevated erythrocyte sedimentation rate: Secondary | ICD-10-CM

## 2015-02-20 MED ORDER — TRAMADOL HCL 50 MG PO TABS: 50.0000 mg | ORAL_TABLET | Freq: Four times a day (QID) | ORAL | Status: DC | PRN

## 2015-02-20 NOTE — Telephone Encounter (Signed)
Refill OK and OK to refer to Dr Charlestine Night.

## 2015-02-20 NOTE — Telephone Encounter (Signed)
Pt aware Rx is ready for pickup. Referral is ordered.

## 2015-02-20 NOTE — Telephone Encounter (Signed)
Referral for Rheumatology  to Dr. Charlestine Night for elevated sed rate. Wednesday only.   Refill Tramadol  Last refill #[phone]  Last visit 10/15/14 [facility]

## 2015-05-12 ENCOUNTER — Other Ambulatory Visit: Payer: Self-pay | Admitting: Family Medicine

## 2015-05-12 MED ORDER — HYDROCHLOROTHIAZIDE 25 MG PO TABS: ORAL_TABLET | ORAL | Status: DC

## 2015-05-27 ENCOUNTER — Telehealth: Payer: Self-pay

## 2015-05-27 NOTE — Telephone Encounter (Signed)
Refill OK

## 2015-05-27 NOTE — Telephone Encounter (Signed)
Pt request Tramadol 50 mg. Last visit 10/15/14 #60 and last Rx refill 02/20/15

## 2015-06-18 ENCOUNTER — Other Ambulatory Visit: Payer: Self-pay | Admitting: *Deleted

## 2015-06-18 MED ORDER — METHOCARBAMOL 500 MG PO TABS: 500.0000 mg | ORAL_TABLET | Freq: Every evening | ORAL | Status: DC | PRN

## 2015-08-10 MED FILL — traMADol HCL 50 MG TABS: 50 | 22 days supply | Qty: 90 | Fill #1

## 2015-08-10 MED FILL — GABAPENTIN 300 MG CAPSULE: 300 | 30 days supply | Qty: 60 | Fill #2

## 2015-08-21 ENCOUNTER — Other Ambulatory Visit: Payer: Self-pay | Admitting: Family Medicine

## 2015-08-21 DIAGNOSIS — M797 Fibromyalgia: Secondary | ICD-10-CM

## 2015-08-24 ENCOUNTER — Other Ambulatory Visit (INDEPENDENT_AMBULATORY_CARE_PROVIDER_SITE_OTHER): Payer: 59

## 2015-08-24 DIAGNOSIS — M797 Fibromyalgia: Secondary | ICD-10-CM

## 2015-08-24 DIAGNOSIS — Z0000 Encounter for general adult medical examination without abnormal findings: Secondary | ICD-10-CM | POA: Diagnosis not present

## 2015-08-24 LAB — CBC WITH DIFFERENTIAL/PLATELET
BASOS ABS: 0 10*3/uL (ref 0.0–0.1)
Basophils Relative: 0.3 % (ref 0.0–3.0)
EOS PCT: 1.7 % (ref 0.0–5.0)
Eosinophils Absolute: 0.1 10*3/uL (ref 0.0–0.7)
HEMATOCRIT: 40.7 % (ref 36.0–46.0)
Hemoglobin: 13.2 g/dL (ref 12.0–15.0)
LYMPHS PCT: 39.1 % (ref 12.0–46.0)
Lymphs Abs: 2.6 10*3/uL (ref 0.7–4.0)
MCHC: 32.5 g/dL (ref 30.0–36.0)
MCV: 81.6 fl (ref 78.0–100.0)
MONOS PCT: 7.6 % (ref 3.0–12.0)
Monocytes Absolute: 0.5 10*3/uL (ref 0.1–1.0)
Neutro Abs: 3.5 10*3/uL (ref 1.4–7.7)
Neutrophils Relative %: 51.3 % (ref 43.0–77.0)
Platelets: 184 10*3/uL (ref 150.0–400.0)
RBC: 4.98 Mil/uL (ref 3.87–5.11)
RDW: 13.6 % (ref 11.5–15.5)
WBC: 6.8 10*3/uL (ref 4.0–10.5)

## 2015-08-24 LAB — HEPATIC FUNCTION PANEL
ALBUMIN: 4.1 g/dL (ref 3.5–5.2)
ALK PHOS: 85 U/L (ref 39–117)
ALT: 14 U/L (ref 0–35)
AST: 18 U/L (ref 0–37)
Bilirubin, Direct: 0.1 mg/dL (ref 0.0–0.3)
TOTAL PROTEIN: 7.1 g/dL (ref 6.0–8.3)
Total Bilirubin: 0.5 mg/dL (ref 0.2–1.2)

## 2015-08-24 LAB — POCT URINALYSIS DIPSTICK
Bilirubin, UA: NEGATIVE
Glucose, UA: NEGATIVE
Ketones, UA: NEGATIVE
Nitrite, UA: NEGATIVE
PROTEIN UA: NEGATIVE
SPEC GRAV UA: 1.01
UROBILINOGEN UA: 0.2
pH, UA: 6

## 2015-08-24 LAB — BASIC METABOLIC PANEL
BUN: 20 mg/dL (ref 6–23)
CALCIUM: 9.4 mg/dL (ref 8.4–10.5)
CO2: 33 mEq/L — ABNORMAL HIGH (ref 19–32)
Chloride: 95 mEq/L — ABNORMAL LOW (ref 96–112)
Creatinine, Ser: 0.76 mg/dL (ref 0.40–1.20)
GFR: 84.93 mL/min (ref >60.00)
GLUCOSE: 84 mg/dL (ref 70–99)
POTASSIUM: 3.4 meq/L — AB (ref 3.5–5.1)
SODIUM: 138 meq/L (ref 135–145)

## 2015-08-24 LAB — VITAMIN D 25 HYDROXY (VIT D DEFICIENCY, FRACTURES): VITD: 19.91 ng/mL — ABNORMAL LOW (ref 30.00–100.00)

## 2015-08-24 LAB — TSH: TSH: 1.44 u[IU]/mL (ref 0.35–4.50)

## 2015-08-24 LAB — SEDIMENTATION RATE: SED RATE: 43 mm/h — AB (ref 0–22)

## 2015-08-24 LAB — LIPID PANEL
Cholesterol: 190 mg/dL (ref 0–200)
HDL: 65.2 mg/dL (ref >39.00)
LDL Cholesterol: 109 mg/dL — ABNORMAL HIGH (ref 0–99)
NONHDL: 124.7
Total CHOL/HDL Ratio: 3
Triglycerides: 79 mg/dL (ref 0.0–149.0)
VLDL: 15.8 mg/dL (ref 0.0–40.0)

## 2015-08-26 ENCOUNTER — Ambulatory Visit (INDEPENDENT_AMBULATORY_CARE_PROVIDER_SITE_OTHER): Payer: 59 | Admitting: Family Medicine

## 2015-08-26 ENCOUNTER — Encounter: Payer: Self-pay | Admitting: Family Medicine

## 2015-08-26 VITALS — BP 120/74 | HR 54 | Temp 97.8°F | Ht 62.5 in | Wt 208.0 lb

## 2015-08-26 DIAGNOSIS — E559 Vitamin D deficiency, unspecified: Secondary | ICD-10-CM

## 2015-08-26 DIAGNOSIS — Z0000 Encounter for general adult medical examination without abnormal findings: Secondary | ICD-10-CM

## 2015-08-26 DIAGNOSIS — Z23 Encounter for immunization: Secondary | ICD-10-CM | POA: Diagnosis not present

## 2015-08-26 MED ORDER — ERGOCALCIFEROL 1.25 MG (50000 UT) PO CAPS: 50000.0000 [IU] | ORAL_CAPSULE | ORAL | Status: DC

## 2015-08-26 MED ORDER — HYDROCODONE-ACETAMINOPHEN 5-325 MG PO TABS: 2.0000 | ORAL_TABLET | ORAL | Status: DC | PRN

## 2015-08-26 MED FILL — VIT D2 1.25 MG (50,000 UNIT: 1.25 MG | 90 days supply | Qty: 12 | Fill #0

## 2015-08-26 MED FILL — HYDROCODON-APAP 5-325: 5-325 | 3 days supply | Qty: 30 | Fill #0

## 2015-08-26 NOTE — Progress Notes (Signed)
Pre visit review using our clinic review tool, if applicable. No additional management support is needed unless otherwise documented below in the visit note. 

## 2015-08-26 NOTE — Progress Notes (Signed)
Subjective:    Patient ID: Nancy [name], female    DOB: [birth year], 50 y.o.   MRN: [id number]  HPI  patient seen for physical. History of cervical radiculopathy followed by neurosurgery. She has fibromyalgia which is followed by rheumatology. Currently taking gabapentin. She's had some recent increased neck pains. She takes tramadol and and has rarely taken hydrocodone. She is currently on gabapentin 300 mg 3 times a day which has helped her fibromyalgia but has not helped her neck pain much. She's had previous hysterectomy. Nonsmoker. No history of hypertension or diabetes.   Has never had screening colonoscopy. She gets yearly mammograms. She had bone density about 6 years ago which was extremely good. Low risk for osteoporosis or osteopenia  Past Medical History  Diagnosis Date  . Bradycardia   . Asthma   . Shortness of breath     with bradycardia  . Dysrhythmia     occ. palpitations  . Headache(784.0)   . PONV (postoperative nausea and vomiting)   . Chicken pox   . Migraines   . Neck pain     bulging disk   Past Surgical History  Procedure Laterality Date  . Cesarean section      1992 and 1995  . Tubal ligation      1995  . Vaginal hysterectomy      1998 or 1997  . Bladder surgery  July 10th 2013  . Diagnostic laparoscopy    . Laparoscopy Bilateral 05/31/2013    Procedure: LAPAROSCOPY withbilateral salpingo-oophorectomy, lysis of adhesions;  Surgeon: Allena Katz, MD;  Location: [facility];  Service: Gynecology;  Laterality: Bilateral;  with clippings of Vaginal  mesh  . Salpingoophorectomy Bilateral 05/31/2013    Procedure: BILATERAL SALPINGO OOPHORECTOMY/TRIM MESH;  Surgeon: Allena Katz, MD;  Location: [facility];  Service: Gynecology;  Laterality: Bilateral;  . Wrist fracture surgery  2005    reports that she has never smoked. She does not have any smokeless tobacco history on file. She reports that she does not drink alcohol or use illicit drugs. family  history includes Arthritis in her mother; Cancer in her maternal grandfather and paternal grandmother; Diabetes in her maternal grandmother and paternal grandmother; Heart disease in her maternal grandfather and maternal grandmother; Hyperlipidemia in her mother; Hypertension in her father. Allergies  Allergen Reactions  . Advair Diskus [Fluticasone-Salmeterol] Cough    excessive  . Erythromycin Diarrhea and Nausea And Vomiting  . Percocet [Oxycodone-Acetaminophen] Other (See Comments)    hallucinations      Review of Systems  Constitutional: Negative for fever, activity change, appetite change, fatigue and unexpected weight change.  HENT: Negative for ear pain, hearing loss, sore throat and trouble swallowing.   Eyes: Negative for visual disturbance.  Respiratory: Negative for cough and shortness of breath.   Cardiovascular: Negative for chest pain and palpitations.  Gastrointestinal: Negative for abdominal pain, diarrhea, constipation and blood in stool.  Genitourinary: Negative for dysuria and hematuria.  Musculoskeletal: Positive for arthralgias and neck pain. Negative for myalgias and back pain.  Skin: Negative for rash.  Neurological: Negative for dizziness, syncope and headaches.  Hematological: Negative for adenopathy.  Psychiatric/Behavioral: Negative for confusion and dysphoric mood.       Objective:   Physical Exam  Constitutional: She is oriented to person, place, and time. She appears well-developed and well-nourished.  HENT:  Head: Normocephalic and atraumatic.  Eyes: EOM are normal. Pupils are equal, round, and reactive to light.  Neck: Normal range of  motion. Neck supple. No thyromegaly present.  Cardiovascular: Normal rate, regular rhythm and normal heart sounds.   No murmur heard. Pulmonary/Chest: Breath sounds normal. No respiratory distress. She has no wheezes. She has no rales.  Abdominal: Soft. Bowel sounds are normal. She exhibits no distension and no  mass. There is no tenderness. There is no rebound and no guarding.  Genitourinary:  Per gyn.  Hx of hysterectomy.    Musculoskeletal: Normal range of motion. She exhibits no edema.  Lymphadenopathy:    She has no cervical adenopathy.  Neurological: She is alert and oriented to person, place, and time. She displays normal reflexes. No cranial nerve deficit.  Skin: No rash noted.  Psychiatric: She has a normal mood and affect. Her behavior is normal. Judgment and thought content normal.          Assessment & Plan:   Physical exam. Patient's immunizations are up-to-date with tetanus updated today. Continue yearly mammogram. No indication for Pap smears since she's had previous hysterectomy. Set up screening colonoscopy.  Labs reviewed. She has low vitamin D and will start vitamin D replacement and repeat 25 hydroxy vitamin D level in 3 months.

## 2015-08-26 NOTE — Patient Instructions (Signed)

## 2015-09-11 MED FILL — GABAPENTIN 300 MG CAPSULE: 300 | 30 days supply | Qty: 60 | Fill #0

## 2015-09-16 ENCOUNTER — Encounter: Payer: Self-pay | Admitting: Internal Medicine

## 2015-10-07 ENCOUNTER — Other Ambulatory Visit: Payer: Self-pay | Admitting: Family Medicine

## 2015-10-07 DIAGNOSIS — Z6836 Body mass index (BMI) 36.0-36.9, adult: Secondary | ICD-10-CM | POA: Diagnosis not present

## 2015-10-07 DIAGNOSIS — M5412 Radiculopathy, cervical region: Secondary | ICD-10-CM | POA: Diagnosis not present

## 2015-10-07 DIAGNOSIS — M4802 Spinal stenosis, cervical region: Secondary | ICD-10-CM | POA: Diagnosis not present

## 2015-10-07 MED ORDER — TRAMADOL HCL 50 MG PO TABS: ORAL_TABLET | ORAL | Status: DC

## 2015-10-07 MED FILL — HYDROCHLOROTHIAZIDE 25 MG T: 25 | 90 days supply | Qty: 90 | Fill #1

## 2015-10-07 MED FILL — traMADol HCL 50 MG TABS: 50 | 30 days supply | Qty: 60 | Fill #0

## 2015-10-08 ENCOUNTER — Other Ambulatory Visit (HOSPITAL_COMMUNITY): Payer: Self-pay | Admitting: Neurological Surgery

## 2015-10-08 DIAGNOSIS — M5412 Radiculopathy, cervical region: Secondary | ICD-10-CM

## 2015-10-14 ENCOUNTER — Ambulatory Visit (HOSPITAL_COMMUNITY)
Admission: RE | Admit: 2015-10-14 | Discharge: 2015-10-14 | Disposition: A | Discharge status: Home / Self Care | Payer: 59 | Source: Ambulatory Visit | Attending: Neurological Surgery | Admitting: Neurological Surgery

## 2015-10-14 DIAGNOSIS — M50123 Cervical disc disorder at C6-C7 level with radiculopathy: Secondary | ICD-10-CM | POA: Diagnosis not present

## 2015-10-14 DIAGNOSIS — M5412 Radiculopathy, cervical region: Secondary | ICD-10-CM | POA: Insufficient documentation

## 2015-10-14 DIAGNOSIS — M50122 Cervical disc disorder at C5-C6 level with radiculopathy: Secondary | ICD-10-CM | POA: Insufficient documentation

## 2015-10-14 DIAGNOSIS — M50221 Other cervical disc displacement at C4-C5 level: Secondary | ICD-10-CM | POA: Diagnosis not present

## 2015-10-14 DIAGNOSIS — M50223 Other cervical disc displacement at C6-C7 level: Secondary | ICD-10-CM | POA: Diagnosis not present

## 2015-10-14 DIAGNOSIS — M50222 Other cervical disc displacement at C5-C6 level: Secondary | ICD-10-CM | POA: Diagnosis not present

## 2015-10-21 MED FILL — GABAPENTIN 300 MG CAPSULE: 300 | 30 days supply | Qty: 60 | Fill #1

## 2015-10-28 DIAGNOSIS — M5020 Other cervical disc displacement, unspecified cervical region: Secondary | ICD-10-CM | POA: Diagnosis not present

## 2015-11-03 ENCOUNTER — Other Ambulatory Visit: Payer: Self-pay | Admitting: Family Medicine

## 2015-11-03 ENCOUNTER — Telehealth (INDEPENDENT_AMBULATORY_CARE_PROVIDER_SITE_OTHER): Payer: 59 | Admitting: *Deleted

## 2015-11-03 DIAGNOSIS — R300 Dysuria: Secondary | ICD-10-CM | POA: Diagnosis not present

## 2015-11-03 LAB — POCT URINALYSIS DIPSTICK
BILIRUBIN UA: NEGATIVE
Blood, UA: NEGATIVE
GLUCOSE UA: NEGATIVE
KETONES UA: NEGATIVE
Nitrite, UA: NEGATIVE
PROTEIN UA: NEGATIVE
Spec Grav, UA: 1.01
Urobilinogen, UA: 0.2
pH, UA: 7

## 2015-11-03 MED ORDER — SULFAMETHOXAZOLE-TRIMETHOPRIM 800-160 MG PO TABS: 1.0000 | ORAL_TABLET | Freq: Two times a day (BID) | ORAL | Status: DC

## 2015-11-03 MED FILL — SULFAMETHOXAZOLE/TMP DS TAB: 800-160 | 10 days supply | Qty: 20 | Fill #0

## 2015-11-03 NOTE — Telephone Encounter (Signed)
Dr Sherren Mocha spoke with Butch Penny concerning urinary concerns.  A urinalysis was done.  Results were positive for leukocytes.  Rx was called in.

## 2015-11-09 ENCOUNTER — Other Ambulatory Visit: Payer: Self-pay

## 2015-11-09 DIAGNOSIS — Z1231 Encounter for screening mammogram for malignant neoplasm of breast: Secondary | ICD-10-CM

## 2015-11-11 ENCOUNTER — Ambulatory Visit (AMBULATORY_SURGERY_CENTER): Payer: Self-pay

## 2015-11-11 VITALS — Ht 62.5 in | Wt 205.2 lb

## 2015-11-11 DIAGNOSIS — Z800 Family history of malignant neoplasm of digestive organs: Secondary | ICD-10-CM

## 2015-11-11 NOTE — Progress Notes (Signed)
No allergies to eggs or soy No past problems with anesthesia except ponv No diet/weight loss meds No home oxygen  Has email and internet; registered for emmi

## 2015-11-17 MED FILL — traMADol HCL 50 MG TABS: 50 | 30 days supply | Qty: 60 | Fill #1

## 2015-11-25 ENCOUNTER — Other Ambulatory Visit (INDEPENDENT_AMBULATORY_CARE_PROVIDER_SITE_OTHER): Payer: 59

## 2015-11-25 ENCOUNTER — Encounter: Payer: Self-pay | Admitting: Family Medicine

## 2015-11-25 ENCOUNTER — Ambulatory Visit
Admission: RE | Admit: 2015-11-25 | Discharge: 2015-11-25 | Disposition: A | Discharge status: Home / Self Care | Payer: 59 | Source: Ambulatory Visit

## 2015-11-25 DIAGNOSIS — Z1231 Encounter for screening mammogram for malignant neoplasm of breast: Secondary | ICD-10-CM | POA: Diagnosis not present

## 2015-11-25 DIAGNOSIS — M542 Cervicalgia: Secondary | ICD-10-CM | POA: Diagnosis not present

## 2015-11-25 DIAGNOSIS — M25561 Pain in right knee: Secondary | ICD-10-CM | POA: Diagnosis not present

## 2015-11-25 DIAGNOSIS — E559 Vitamin D deficiency, unspecified: Secondary | ICD-10-CM | POA: Diagnosis not present

## 2015-11-25 DIAGNOSIS — M797 Fibromyalgia: Secondary | ICD-10-CM | POA: Diagnosis not present

## 2015-11-25 DIAGNOSIS — R634 Abnormal weight loss: Secondary | ICD-10-CM | POA: Diagnosis not present

## 2015-11-25 LAB — VITAMIN D 25 HYDROXY (VIT D DEFICIENCY, FRACTURES): VITD: 21.5 ng/mL — AB (ref 30.00–100.00)

## 2015-11-25 MED FILL — GABAPENTIN 300 MG CAPSULE: 300 | 90 days supply | Qty: 180 | Fill #0

## 2015-11-26 ENCOUNTER — Telehealth: Payer: Self-pay | Admitting: Family Medicine

## 2015-11-26 MED ORDER — CHOLECALCIFEROL 1.25 MG (50000 UT) PO TABS: 1.0000 | ORAL_TABLET | ORAL | Status: DC

## 2015-11-26 MED FILL — VIT D3-50 50,000 UNITS CAPS: 1.25 MG | 84 days supply | Qty: 12 | Fill #0

## 2015-11-26 NOTE — Telephone Encounter (Signed)
Pt notified of low Vit D level of 21.  Will continue with  Vit D 50,000 IU once weekly and daily 2,000 IU and repeat Vit D in 3 months.

## 2015-11-27 ENCOUNTER — Encounter: Payer: Self-pay | Admitting: Internal Medicine

## 2015-11-27 ENCOUNTER — Ambulatory Visit (AMBULATORY_SURGERY_CENTER): Payer: 59 | Admitting: Internal Medicine

## 2015-11-27 VITALS — BP 135/62 | HR 54 | Temp 97.5°F | Resp 11 | Ht 62.0 in | Wt 205.0 lb

## 2015-11-27 DIAGNOSIS — Z1211 Encounter for screening for malignant neoplasm of colon: Secondary | ICD-10-CM

## 2015-11-27 DIAGNOSIS — D129 Benign neoplasm of anus and anal canal: Secondary | ICD-10-CM

## 2015-11-27 DIAGNOSIS — D123 Benign neoplasm of transverse colon: Secondary | ICD-10-CM | POA: Diagnosis not present

## 2015-11-27 DIAGNOSIS — K621 Rectal polyp: Secondary | ICD-10-CM | POA: Diagnosis not present

## 2015-11-27 DIAGNOSIS — I10 Essential (primary) hypertension: Secondary | ICD-10-CM | POA: Diagnosis not present

## 2015-11-27 DIAGNOSIS — Z800 Family history of malignant neoplasm of digestive organs: Secondary | ICD-10-CM

## 2015-11-27 DIAGNOSIS — J45909 Unspecified asthma, uncomplicated: Secondary | ICD-10-CM | POA: Diagnosis not present

## 2015-11-27 DIAGNOSIS — D128 Benign neoplasm of rectum: Secondary | ICD-10-CM

## 2015-11-27 HISTORY — PX: COLONOSCOPY: SHX174

## 2015-11-27 MED ORDER — SODIUM CHLORIDE 0.9 % IV SOLN: 500.0000 mL | INTRAVENOUS | Status: DC

## 2015-11-27 NOTE — Progress Notes (Signed)
Report to PACU, RN, vss, BBS= Clear.  

## 2015-11-27 NOTE — Progress Notes (Signed)
No problems noted in the recovery room. maw 

## 2015-11-27 NOTE — Patient Instructions (Signed)
YOU HAD AN ENDOSCOPIC PROCEDURE TODAY AT THE [facility]:   Refer to the procedure report that was given to you for any specific questions about what was found during the examination.  If the procedure report does not answer your questions, please call your gastroenterologist to clarify.  If you requested that your care partner not be given the details of your procedure findings, then the procedure report has been included in a sealed envelope for you to review at your convenience later.  YOU SHOULD EXPECT: Some feelings of bloating in the abdomen. Passage of more gas than usual.  Walking can help get rid of the air that was put into your GI tract during the procedure and reduce the bloating. If you had a lower endoscopy (such as a colonoscopy or flexible sigmoidoscopy) you may notice spotting of blood in your stool or on the toilet paper. If you underwent a bowel prep for your procedure, you may not have a normal bowel movement for a few days.  Please Note:  You might notice some irritation and congestion in your nose or some drainage.  This is from the oxygen used during your procedure.  There is no need for concern and it should clear up in a day or so.  SYMPTOMS TO REPORT IMMEDIATELY:   Following lower endoscopy (colonoscopy or flexible sigmoidoscopy):  Excessive amounts of blood in the stool  Significant tenderness or worsening of abdominal pains  Swelling of the abdomen that is new, acute  Fever of 100F or higher   For urgent or emergent issues, a gastroenterologist can be reached at any hour by calling [phone].   DIET: Your first meal following the procedure should be a small meal and then it is ok to progress to your normal diet. Heavy or fried foods are harder to digest and may make you feel nauseous or bloated.  Likewise, meals heavy in dairy and vegetables can increase bloating.  Drink plenty of fluids but you should avoid alcoholic beverages for 24  hours.  ACTIVITY:  You should plan to take it easy for the rest of today and you should NOT DRIVE or use heavy machinery until tomorrow (because of the sedation medicines used during the test).    FOLLOW UP: Our staff will call the number listed on your records the next business day following your procedure to check on you and address any questions or concerns that you may have regarding the information given to you following your procedure. If we do not reach you, we will leave a message.  However, if you are feeling well and you are not experiencing any problems, there is no need to return our call.  We will assume that you have returned to your regular daily activities without incident.  If any biopsies were taken you will be contacted by phone or by letter within the next 1-3 weeks.  Please call us at [phone] if you have not heard about the biopsies in 3 weeks.    SIGNATURES/CONFIDENTIALITY: You and/or your care partner have signed paperwork which will be entered into your electronic medical record.  These signatures attest to the fact that that the information above on your After Visit Summary has been reviewed and is understood.  Full responsibility of the confidentiality of this discharge information lies with you and/or your care-partner.   Handouts were given to your care partner on polyps and hemorrhoids. You may resume your current medications today. Await biopsy results. Please call   if any questions or concerns.   

## 2015-11-27 NOTE — Progress Notes (Signed)
Called to room to assist during endoscopic procedure.  Patient ID and intended procedure confirmed with present staff. Received instructions for my participation in the procedure from the performing physician.  

## 2015-11-27 NOTE — Op Note (Signed)
[facility] Patient Name: Nancy [name] Procedure Date: 11/27/2015 1:32 PM MRN: [id number] Endoscopist: Jerene Bears , MD Age: 50 Date of Birth: 26-Oct-1965 Gender: Female Procedure:                Colonoscopy Indications:              Colon cancer screening in patient at increased                            risk: Family history of colorectal cancer in                            multiple 2nd degree relatives, This is the                            patient's first colonoscopy Medicines:                Monitored Anesthesia Care Procedure:                Pre-Anesthesia Assessment:                           - Prior to the procedure, a History and Physical                            was performed, and patient medications and                            allergies were reviewed. The patient's tolerance of                            previous anesthesia was also reviewed. The risks                            and benefits of the procedure and the sedation                            options and risks were discussed with the patient.                            All questions were answered, and informed consent                            was obtained. Prior Anticoagulants: The patient has                            taken no previous anticoagulant or antiplatelet                            agents. ASA Grade Assessment: II - A patient with                            mild systemic disease. After reviewing the risks  and benefits, the patient was deemed in                            satisfactory condition to undergo the procedure.                           After obtaining informed consent, the colonoscope                            was passed under direct vision. Throughout the                            procedure, the patient's blood pressure, pulse, and                            oxygen saturations were monitored continuously. The                            Model  CF-[id number]) scope was introduced                            through the anus and advanced to the the cecum,                            identified by appendiceal orifice and ileocecal                            valve. The colonoscopy was performed without                            difficulty. The patient tolerated the procedure                            well. The quality of the bowel preparation was                            good. The ileocecal valve, appendiceal orifice, and                            rectum were photographed. Scope In: 1:37:18 PM Scope Out: 1:50:47 PM Scope Withdrawal Time: 0 hours 10 minutes 30 seconds  Total Procedure Duration: 0 hours 13 minutes 29 seconds  Findings:                 The digital rectal exam was normal.                           A 6 mm polyp was found in the transverse colon. The                            polyp was flat. The polyp was removed with a cold                            snare. Resection and retrieval were complete.  A 4 mm polyp was found in the rectum. The polyp was                            sessile. The polyp was removed with a cold snare.                            Resection and retrieval were complete.                           Internal hemorrhoids were found during                            retroflexion. The hemorrhoids were small. Complications:            No immediate complications. Estimated Blood Loss:     Estimated blood loss was minimal. Impression:               - One 6 mm polyp in the transverse colon, removed                            with a cold snare. Resected and retrieved.                           - One 4 mm polyp in the rectum, removed with a cold                            snare. Resected and retrieved.                           - Internal hemorrhoids. Recommendation:           - Patient has a contact number available for                            emergencies. The signs and  symptoms of potential                            delayed complications were discussed with the                            patient. Return to normal activities tomorrow.                            Written discharge instructions were provided to the                            patient.                           - Resume previous diet.                           - Continue present medications.                           - Await pathology results.                           -  Repeat colonoscopy in 5 years for surveillance. Jerene Bears, MD 11/27/2015 1:54:53 PM This report has been signed electronically.

## 2015-11-30 ENCOUNTER — Telehealth: Payer: Self-pay

## 2015-11-30 NOTE — Telephone Encounter (Signed)
  Follow up Call-  No flowsheet data found.   Patient questions:  Do you have a fever, pain , or abdominal swelling? No. Pain Score  0 *  Have you tolerated food without any problems? Yes.    Have you been able to return to your normal activities? Yes.    Do you have any questions about your discharge instructions: Diet   No. Medications  No. Follow up visit  No.  Do you have questions or concerns about your Care? No.  Actions: * If pain score is 4 or above: No action needed, pain <4.  I called the pt's cell # [phone] and she reported she was fine, no problems noted. maw

## 2015-12-01 ENCOUNTER — Encounter: Payer: Self-pay | Admitting: Internal Medicine

## 2015-12-31 MED FILL — traMADol HCL 50 MG TABS: 50 | 30 days supply | Qty: 60 | Fill #2

## 2016-01-19 ENCOUNTER — Other Ambulatory Visit: Payer: Self-pay | Admitting: Family Medicine

## 2016-01-19 MED ORDER — HYDROCODONE-ACETAMINOPHEN 5-325 MG PO TABS: 2.0000 | ORAL_TABLET | ORAL | Status: DC | PRN

## 2016-01-19 NOTE — Telephone Encounter (Signed)
Last refill was 08/26/15 #30 Please advise.

## 2016-01-19 NOTE — Telephone Encounter (Signed)
Refill OK

## 2016-01-20 NOTE — Telephone Encounter (Signed)
RX approved and given to pt in person.

## 2016-01-28 MED FILL — HYDROCODON-APAP 5-325: 5-325 | 3 days supply | Qty: 30 | Fill #0

## 2016-02-05 ENCOUNTER — Other Ambulatory Visit: Payer: Self-pay | Admitting: *Deleted

## 2016-02-05 MED ORDER — HYDROCHLOROTHIAZIDE 25 MG PO TABS: ORAL_TABLET | ORAL | Status: DC

## 2016-02-05 MED FILL — traMADol HCL 50 MG TABS: 50 | 30 days supply | Qty: 60 | Fill #3

## 2016-02-05 MED FILL — HYDROCHLOROTHIAZIDE 25 MG T: 25 | 90 days supply | Qty: 90 | Fill #0

## 2016-02-10 ENCOUNTER — Ambulatory Visit (INDEPENDENT_AMBULATORY_CARE_PROVIDER_SITE_OTHER): Payer: 59 | Admitting: Family Medicine

## 2016-02-10 ENCOUNTER — Encounter: Payer: Self-pay | Admitting: Family Medicine

## 2016-02-10 ENCOUNTER — Other Ambulatory Visit: Payer: Self-pay | Admitting: Family Medicine

## 2016-02-10 VITALS — BP 100/80 | HR 73 | Temp 98.3°F | Ht 62.0 in | Wt 204.0 lb

## 2016-02-10 DIAGNOSIS — E876 Hypokalemia: Secondary | ICD-10-CM | POA: Diagnosis not present

## 2016-02-10 DIAGNOSIS — E871 Hypo-osmolality and hyponatremia: Secondary | ICD-10-CM

## 2016-02-10 DIAGNOSIS — Z01818 Encounter for other preprocedural examination: Secondary | ICD-10-CM | POA: Diagnosis not present

## 2016-02-10 LAB — BASIC METABOLIC PANEL
BUN: 19 mg/dL (ref 6–23)
CALCIUM: 10 mg/dL (ref 8.4–10.5)
CHLORIDE: 100 meq/L (ref 96–112)
CO2: 28 meq/L (ref 19–32)
CREATININE: 0.77 mg/dL (ref 0.40–1.20)
GFR: 83.51 mL/min (ref >60.00)
GLUCOSE: 97 mg/dL (ref 70–99)
Potassium: 4.2 mEq/L (ref 3.5–5.1)
Sodium: 139 mEq/L (ref 135–145)

## 2016-02-10 NOTE — Progress Notes (Signed)
Pre visit review using our clinic review tool, if applicable. No additional management support is needed unless otherwise documented below in the visit note. 

## 2016-02-10 NOTE — Progress Notes (Signed)
Subjective:    Patient ID: Nancy [name], female    DOB: 1966-02-23, 50 y.o.   MRN: [id number]  HPI Patient here for preoperative assessment. Scheduled for cervical neck fusion this Friday as an outpatient at the surgical center. She has no cardiac history. She's had prior history of mild hypokalemia. She had labs earlier today which revealed normal potassium She states that she had slightly low heart rate around 46 in recovery from previous surgery. She's had previous Holter monitor and reported echocardiogram which were unremarkable. She's not any recent syncope. No chest pains. No dyspnea. Occasional dizziness but no hx of syncope.  No chronic lung disease. Nonsmoker. No family history of premature CAD.  Past Medical History  Diagnosis Date  . Bradycardia   . Asthma   . Shortness of breath     with bradycardia  . Dysrhythmia     occ. palpitations  . Headache(784.0)   . PONV (postoperative nausea and vomiting)   . Chicken pox   . Migraines   . Neck pain     bulging disk   Past Surgical History  Procedure Laterality Date  . Cesarean section      1992 and 1995  . Tubal ligation      1995  . Vaginal hysterectomy      1998 or 1997  . Bladder surgery  July 10th 2013  . Diagnostic laparoscopy    . Laparoscopy Bilateral 05/31/2013    Procedure: LAPAROSCOPY withbilateral salpingo-oophorectomy, lysis of adhesions;  Surgeon: Allena Katz, MD;  Location: [facility];  Service: Gynecology;  Laterality: Bilateral;  with clippings of Vaginal  mesh  . Salpingoophorectomy Bilateral 05/31/2013    Procedure: BILATERAL SALPINGO OOPHORECTOMY/TRIM MESH;  Surgeon: Allena Katz, MD;  Location: [facility];  Service: Gynecology;  Laterality: Bilateral;  . Wrist fracture surgery  2005    reports that she has never smoked. She does not have any smokeless tobacco history on file. She reports that she drinks alcohol. She reports that she does not use illicit drugs. family history includes  Arthritis in her mother; Cancer in her maternal grandfather and paternal grandmother; Colon cancer in her maternal grandfather; Diabetes in her maternal grandmother and paternal grandmother; Heart disease in her maternal grandfather and maternal grandmother; Hyperlipidemia in her mother; Hypertension in her father. Allergies  Allergen Reactions  . Advair Diskus [Fluticasone-Salmeterol] Cough    excessive  . Erythromycin Diarrhea and Nausea And Vomiting  . Percocet [Oxycodone-Acetaminophen] Other (See Comments)    hallucinations      Review of Systems  Constitutional: Negative for fever, chills and fatigue.  Eyes: Negative for visual disturbance.  Respiratory: Negative for cough, chest tightness, shortness of breath and wheezing.   Cardiovascular: Negative for chest pain, palpitations and leg swelling.  Gastrointestinal: Negative for abdominal pain.  Endocrine: Negative for polydipsia and polyuria.  Genitourinary: Negative for dysuria.  Neurological: Negative for dizziness, seizures, syncope, weakness, light-headedness and headaches.       Objective:   Physical Exam  Constitutional: She appears well-developed and well-nourished.  Neck: Neck supple. No thyromegaly present.  No carotid bruits  Cardiovascular: Normal rate and regular rhythm.  Exam reveals no gallop.   No murmur heard. Pulmonary/Chest: Effort normal and breath sounds normal. No respiratory distress. She has no wheezes. She has no rales.  Musculoskeletal: She exhibits no edema.  Lymphadenopathy:    She has no cervical adenopathy.          Assessment & Plan:  Presurgical  clearance. EKG reveals sinus rhythm with no acute ST-T-segment changes. Electrolytes from earlier today normal. No contraindications for surgery.  Eulas Post MD [facility] at [facility]

## 2016-02-12 DIAGNOSIS — M50222 Other cervical disc displacement at C5-C6 level: Secondary | ICD-10-CM | POA: Diagnosis not present

## 2016-02-12 DIAGNOSIS — M50223 Other cervical disc displacement at C6-C7 level: Secondary | ICD-10-CM | POA: Diagnosis not present

## 2016-02-12 DIAGNOSIS — M5020 Other cervical disc displacement, unspecified cervical region: Secondary | ICD-10-CM | POA: Diagnosis not present

## 2016-02-12 DIAGNOSIS — M50122 Cervical disc disorder at C5-C6 level with radiculopathy: Secondary | ICD-10-CM | POA: Diagnosis not present

## 2016-02-12 DIAGNOSIS — M50123 Cervical disc disorder at C6-C7 level with radiculopathy: Secondary | ICD-10-CM | POA: Diagnosis not present

## 2016-02-12 DIAGNOSIS — M4722 Other spondylosis with radiculopathy, cervical region: Secondary | ICD-10-CM | POA: Diagnosis not present

## 2016-02-12 MED FILL — diazePAM 5 MG TABS: 5 | 15 days supply | Qty: 60 | Fill #0

## 2016-02-12 MED FILL — HYDROCODON-APAP 5-325: 5-325 | 8 days supply | Qty: 60 | Fill #0

## 2016-02-16 MED FILL — DOXYCYCLINE HYCLATE 100 MG: 100 | 10 days supply | Qty: 20 | Fill #0

## 2016-03-02 DIAGNOSIS — M5020 Other cervical disc displacement, unspecified cervical region: Secondary | ICD-10-CM | POA: Diagnosis not present

## 2016-03-02 DIAGNOSIS — Z6837 Body mass index (BMI) 37.0-37.9, adult: Secondary | ICD-10-CM | POA: Diagnosis not present

## 2016-04-13 MED FILL — GABAPENTIN 300 MG CAPSULE: 300 | 90 days supply | Qty: 180 | Fill #1

## 2016-05-25 DIAGNOSIS — M5020 Other cervical disc displacement, unspecified cervical region: Secondary | ICD-10-CM | POA: Diagnosis not present

## 2016-06-01 DIAGNOSIS — H524 Presbyopia: Secondary | ICD-10-CM | POA: Diagnosis not present

## 2016-06-29 MED FILL — HYDROCHLOROTHIAZIDE 25 MG T: 25 | 90 days supply | Qty: 90 | Fill #1

## 2016-08-16 ENCOUNTER — Other Ambulatory Visit: Payer: Self-pay

## 2016-08-16 MED ORDER — TRAMADOL HCL 50 MG PO TABS: ORAL_TABLET | ORAL | 3 refills | Status: DC

## 2016-08-30 ENCOUNTER — Ambulatory Visit (INDEPENDENT_AMBULATORY_CARE_PROVIDER_SITE_OTHER): Payer: 59 | Admitting: Family Medicine

## 2016-08-30 VITALS — BP 110/80 | HR 63 | Ht 62.0 in

## 2016-08-30 DIAGNOSIS — R319 Hematuria, unspecified: Secondary | ICD-10-CM

## 2016-08-30 DIAGNOSIS — R300 Dysuria: Secondary | ICD-10-CM | POA: Diagnosis not present

## 2016-08-30 DIAGNOSIS — N8110 Cystocele, unspecified: Secondary | ICD-10-CM | POA: Diagnosis not present

## 2016-08-30 LAB — POCT URINALYSIS DIPSTICK
BILIRUBIN UA: NEGATIVE
Glucose, UA: NEGATIVE
Ketones, UA: NEGATIVE
NITRITE UA: NEGATIVE
PH UA: 5.5
Protein, UA: NEGATIVE
Spec Grav, UA: 1.02
UROBILINOGEN UA: 0.2

## 2016-08-30 MED FILL — traMADol HCL 50 MG TABS: 50 | 30 days supply | Qty: 60 | Fill #0

## 2016-08-30 NOTE — Progress Notes (Signed)
Pre visit review using our clinic review tool, if applicable. No additional management support is needed unless otherwise documented below in the visit note. 

## 2016-08-30 NOTE — Progress Notes (Signed)
Subjective:     Patient ID: Nancy [name], female   DOB: 1966/07/25, 51 y.o.   MRN: [id number]  HPI Patient seen for follow-up regarding probable UTI. She on Saturday had worsening of urine frequency and burning with urination and noted gross hematuria. She did E-visit assessment and was prescribed Cipro 500 mg twice a day. Her symptoms are improved. She has less burning and less frequency. Her gross hematuria is resolved. She thinks she might have had some low-grade fever Saturday but none since then. She's had some slight nausea but no vomiting. No flank pain.  Other issue is that patient had bladder sling 4 years ago when she lived in [place]. She saw her gynecologist and had apparently some mesh erosion and is requesting urology referral this time to have further evaluated.  Past Medical History:  Diagnosis Date  . Asthma   . Bradycardia   . Chicken pox   . Dysrhythmia    occ. palpitations  . Headache(784.0)   . Migraines   . Neck pain    bulging disk  . PONV (postoperative nausea and vomiting)   . Shortness of breath    with bradycardia   Past Surgical History:  Procedure Laterality Date  . BLADDER SURGERY  July 10th 2013  . [place]  . DIAGNOSTIC LAPAROSCOPY    . LAPAROSCOPY Bilateral 05/31/2013   Procedure: LAPAROSCOPY withbilateral salpingo-oophorectomy, lysis of adhesions;  Surgeon: Allena Katz, MD;  Location: [facility];  Service: Gynecology;  Laterality: Bilateral;  with clippings of Vaginal  mesh  . SALPINGOOPHORECTOMY Bilateral 05/31/2013   Procedure: BILATERAL SALPINGO OOPHORECTOMY/TRIM MESH;  Surgeon: Allena Katz, MD;  Location: [facility];  Service: Gynecology;  Laterality: Bilateral;  . TUBAL LIGATION     1995  . VAGINAL HYSTERECTOMY     1998 or 1997  . WRIST FRACTURE SURGERY  2005    reports that she has never smoked. She does not have any smokeless tobacco history on file. She reports that she drinks alcohol. She reports that  she does not use drugs. family history includes Arthritis in her mother; Cancer in her maternal grandfather and paternal grandmother; Colon cancer in her maternal grandfather; Diabetes in her maternal grandmother and paternal grandmother; Heart disease in her maternal grandfather and maternal grandmother; Hyperlipidemia in her mother; Hypertension in her father. Allergies  Allergen Reactions  . Advair Diskus [Fluticasone-Salmeterol] Cough    excessive  . Erythromycin Diarrhea and Nausea And Vomiting  . Percocet [Oxycodone-Acetaminophen] Other (See Comments)    hallucinations     Review of Systems  Constitutional: Negative for chills and fever.  Respiratory: Negative for cough.   Gastrointestinal: Negative for abdominal pain.  Genitourinary: Positive for dysuria. Negative for flank pain.       Objective:   Physical Exam  Constitutional: She appears well-developed and well-nourished.  Cardiovascular: Normal rate and regular rhythm.   Pulmonary/Chest: Effort normal and breath sounds normal. No respiratory distress. She has no wheezes. She has no rales.       Assessment:     #1 recent hemorrhagic cystitis clinically improving. Patient requested culture be sent today but we explained this would probably have much lower yield on antibiotic  #2 history of cystocele-with previous bladder sling    Plan:     -Urine culture (and micro) sent -Stay well-hydrated -Finish out Cipro -Set up urology referral  Eulas Post MD [facility] at [facility]

## 2016-08-30 NOTE — Patient Instructions (Signed)
Let me know if you have any recurrent gross hematuria.   I will set up Urology referral.

## 2016-08-31 ENCOUNTER — Encounter: Payer: Self-pay | Admitting: Family Medicine

## 2016-08-31 LAB — URINALYSIS, MICROSCOPIC ONLY: RBC / HPF: NONE SEEN (ref 0–?)

## 2016-09-01 ENCOUNTER — Encounter: Payer: Self-pay | Admitting: Physician Assistant

## 2016-09-01 ENCOUNTER — Encounter: Payer: Self-pay | Admitting: Family Medicine

## 2016-09-01 LAB — URINE CULTURE: ORGANISM ID, BACTERIA: NO GROWTH

## 2016-09-21 ENCOUNTER — Other Ambulatory Visit: Payer: Self-pay | Admitting: Physician Assistant

## 2016-09-21 ENCOUNTER — Other Ambulatory Visit: Payer: 59

## 2016-09-21 ENCOUNTER — Other Ambulatory Visit: Payer: Self-pay | Admitting: Radiology

## 2016-09-21 DIAGNOSIS — R300 Dysuria: Secondary | ICD-10-CM | POA: Diagnosis not present

## 2016-09-21 LAB — POCT URINALYSIS DIPSTICK
Bilirubin, UA: NEGATIVE
Blood, UA: NEGATIVE
GLUCOSE UA: NEGATIVE
Ketones, UA: NEGATIVE
NITRITE UA: NEGATIVE
Protein, UA: NEGATIVE
Spec Grav, UA: 1.015
UROBILINOGEN UA: 0.2
pH, UA: 7

## 2016-09-21 MED ORDER — CIPROFLOXACIN HCL 500 MG PO TABS: 500.0000 mg | ORAL_TABLET | Freq: Two times a day (BID) | ORAL | 0 refills | Status: DC

## 2016-09-21 MED FILL — CIPROFLOXACIN HCL 500 MG TA: 500 | 5 days supply | Qty: 10 | Fill #0

## 2016-09-21 NOTE — Telephone Encounter (Signed)
Patient complains of dysuria and nausea x 2 days. Denies back pain, fever. POCT UA with 2+ leukocytes. Will send in ciprofloxacin. Follow-up with PCP if no improvement. Urology consult pending.  Inda Coke PA-C 09/21/16

## 2016-09-22 LAB — URINE CULTURE: Organism ID, Bacteria: NO GROWTH

## 2016-09-28 MED FILL — GABAPENTIN 300 MG CAPSULE: 300 | 90 days supply | Qty: 180 | Fill #2

## 2016-11-02 DIAGNOSIS — N3941 Urge incontinence: Secondary | ICD-10-CM | POA: Diagnosis not present

## 2016-11-02 DIAGNOSIS — R3914 Feeling of incomplete bladder emptying: Secondary | ICD-10-CM | POA: Diagnosis not present

## 2016-11-02 DIAGNOSIS — R350 Frequency of micturition: Secondary | ICD-10-CM | POA: Diagnosis not present

## 2016-11-30 DIAGNOSIS — N390 Urinary tract infection, site not specified: Secondary | ICD-10-CM | POA: Diagnosis not present

## 2016-12-01 ENCOUNTER — Other Ambulatory Visit: Payer: Self-pay | Admitting: Family Medicine

## 2016-12-01 MED FILL — HYDROCHLOROTHIAZIDE 25 MG T: 25 | 90 days supply | Qty: 90 | Fill #0

## 2017-01-11 ENCOUNTER — Encounter: Payer: Self-pay | Admitting: Family Medicine

## 2017-01-11 ENCOUNTER — Ambulatory Visit (INDEPENDENT_AMBULATORY_CARE_PROVIDER_SITE_OTHER): Payer: 59 | Admitting: Family Medicine

## 2017-01-11 VITALS — BP 110/70 | HR 57 | Temp 98.0°F | Wt 220.0 lb

## 2017-01-11 DIAGNOSIS — M2550 Pain in unspecified joint: Secondary | ICD-10-CM

## 2017-01-11 MED ORDER — PREDNISONE 10 MG PO TABS: ORAL_TABLET | ORAL | 0 refills | Status: DC

## 2017-01-11 MED ORDER — TRAMADOL HCL 50 MG PO TABS: ORAL_TABLET | ORAL | 3 refills | Status: DC

## 2017-01-11 NOTE — Progress Notes (Signed)
Subjective:     Patient ID: Nancy [name], female   DOB: 07-15-1966, 51 y.o.   MRN: [id number]  HPI Patient's seen with polyarthralgias. This past Friday she had some chills. No documented fever. By "Sunday, she noted fairly severe arthritis involving predominantly hips, ankles, knees, feet, and shoulders. Sparing of wrist and hands. She did not see any objective evidence for swelling, erythema, or warmth. Denied any recent sore throat, headache, nasal congestion, cough, nausea, vomiting. No rash. She's had some diffuse stiffness which has lasted throughout the day. No relief with anti-inflammatory medications.  She does recall removing a very small deer tick from her left thigh region about 2 weeks ago. No skin rash, as above. She has history of fibromyalgia and has been evaluated by rheumatologist the past. She has pending appointment with new rheumatologist later this summer.  Past Medical History:  Diagnosis Date  . Asthma   . Bradycardia   . Chicken pox   . Dysrhythmia    occ. palpitations  . Headache(784.0)   . Migraines   . Neck pain    bulging disk  . PONV (postoperative nausea and vomiting)   . Shortness of breath    with bradycardia   Past Surgical History:  Procedure Laterality Date  . BLADDER SURGERY  July 10th 2013  . CESAREAN SECTION     1992 and 1995  . DIAGNOSTIC LAPAROSCOPY    . LAPAROSCOPY Bilateral 05/31/2013   Procedure: LAPAROSCOPY withbilateral salpingo-oophorectomy, lysis of adhesions;  Surgeon: James E Tomblin II, MD;  Location: [facility];  Service: Gynecology;  Laterality: Bilateral;  with clippings of Vaginal  mesh  . SALPINGOOPHORECTOMY Bilateral 05/31/2013   Procedure: BILATERAL SALPINGO OOPHORECTOMY/TRIM MESH;  Surgeon: James E Tomblin II, MD;  Location: [facility];  Service: Gynecology;  Laterality: Bilateral;  . TUBAL LIGATION     1995  . VAGINAL HYSTERECTOMY     19" 98 or 1997  . WRIST FRACTURE SURGERY  2005    reports that she has never smoked. She has  never used smokeless tobacco. She reports that she drinks alcohol. She reports that she does not use drugs. family history includes Arthritis in her mother; Cancer in her maternal grandfather and paternal grandmother; Colon cancer in her maternal grandfather; Diabetes in her maternal grandmother and paternal grandmother; Heart disease in her maternal grandfather and maternal grandmother; Hyperlipidemia in her mother; Hypertension in her father. Allergies  Allergen Reactions  . Advair Diskus [Fluticasone-Salmeterol] Cough    excessive  . Erythromycin Diarrhea and Nausea And Vomiting  . Percocet [Oxycodone-Acetaminophen] Other (See Comments)    hallucinations     Review of Systems  Constitutional: Positive for chills. Negative for fever.  HENT: Negative for sore throat.   Respiratory: Negative for cough and shortness of breath.   Cardiovascular: Negative for chest pain and leg swelling.  Gastrointestinal: Negative for abdominal pain.  Genitourinary: Negative for dysuria.  Musculoskeletal: Positive for arthralgias. Negative for joint swelling.  Skin: Negative for rash.  Hematological: Negative for adenopathy. Does not bruise/bleed easily.       Objective:   Physical Exam  Constitutional: She appears well-developed and well-nourished.  HENT:  Mouth/Throat: Oropharynx is clear and moist.  Neck: Neck supple. No thyromegaly present.  Cardiovascular: Normal rate and regular rhythm.  Exam reveals no gallop.   No murmur heard. Pulmonary/Chest: Effort normal and breath sounds normal. No respiratory distress. She has no wheezes. She has no rales.  Musculoskeletal: She exhibits no edema.  Patient's has no objective evidence  for inflammation, erythema or warmth involving any of her joints. She does have some nonspecific tenderness in palpating around the ankles and knees bilaterally. No obvious effusion.  Lymphadenopathy:    She has no cervical adenopathy.  Skin: No rash noted.        Assessment:     Patient is seen with acute onset of polyarthralgias involving multiple joints as above. She does recall removing a small tick couple weeks ago but doubt related. She's not had any other concerning symptoms such as fever or rash. Past history of known fibromyalgia.      Plan:     -Watch closely any new symptoms such as fever or rash -Obtain further labs with CBC, antinuclear antibody, rheumatoid factor, CCP antibody, sedimentation rate, Lyme antibodies -We discussed brief taper of prednisone pending evaluation above  Eulas Post MD [place] [facility]

## 2017-01-12 ENCOUNTER — Other Ambulatory Visit (INDEPENDENT_AMBULATORY_CARE_PROVIDER_SITE_OTHER): Payer: 59

## 2017-01-12 ENCOUNTER — Encounter: Payer: Self-pay | Admitting: Family Medicine

## 2017-01-12 DIAGNOSIS — M2550 Pain in unspecified joint: Secondary | ICD-10-CM

## 2017-01-12 LAB — CBC WITH DIFFERENTIAL/PLATELET
BASOS ABS: 0 10*3/uL (ref 0.0–0.1)
Basophils Relative: 0.3 % (ref 0.0–3.0)
EOS ABS: 0.1 10*3/uL (ref 0.0–0.7)
Eosinophils Relative: 2.3 % (ref 0.0–5.0)
HCT: 38.3 % (ref 36.0–46.0)
Hemoglobin: 12.3 g/dL (ref 12.0–15.0)
LYMPHS ABS: 2.5 10*3/uL (ref 0.7–4.0)
Lymphocytes Relative: 42.1 % (ref 12.0–46.0)
MCHC: 32.2 g/dL (ref 30.0–36.0)
MCV: 84.4 fl (ref 78.0–100.0)
MONO ABS: 0.4 10*3/uL (ref 0.1–1.0)
MONOS PCT: 7 % (ref 3.0–12.0)
NEUTROS PCT: 48.3 % (ref 43.0–77.0)
Neutro Abs: 2.9 10*3/uL (ref 1.4–7.7)
Platelets: 170 10*3/uL (ref 150.0–400.0)
RBC: 4.54 Mil/uL (ref 3.87–5.11)
RDW: 13.3 % (ref 11.5–15.5)
WBC: 5.9 10*3/uL (ref 4.0–10.5)

## 2017-01-12 LAB — SEDIMENTATION RATE: SED RATE: 26 mm/h (ref 0–30)

## 2017-01-12 MED FILL — predniSONE 10 MG TABS: 10 | 9 days supply | Qty: 24 | Fill #0

## 2017-01-13 LAB — CYCLIC CITRUL PEPTIDE ANTIBODY, IGG

## 2017-01-13 LAB — ANA: ANA: NEGATIVE

## 2017-01-13 LAB — LYME AB/WESTERN BLOT REFLEX: B burgdorferi Ab IgG+IgM: 1.46 Index — ABNORMAL HIGH (ref <0.90)

## 2017-01-13 LAB — RHEUMATOID FACTOR: Rhuematoid fact SerPl-aCnc: <14 IU/mL (ref <14)

## 2017-01-17 LAB — LYME ABY, WSTRN BLT IGG & IGM W/BANDS
B BURGDORFERI IGM ABS (IB): NEGATIVE
B burgdorferi IgG Abs (IB): NEGATIVE
LYME DISEASE 18 KD IGG: NONREACTIVE
LYME DISEASE 23 KD IGM: NONREACTIVE
LYME DISEASE 39 KD IGM: NONREACTIVE
LYME DISEASE 41 KD IGM: NONREACTIVE
LYME DISEASE 58 KD IGG: NONREACTIVE
Lyme Disease 23 kD IgG: NONREACTIVE
Lyme Disease 28 kD IgG: NONREACTIVE
Lyme Disease 30 kD IgG: NONREACTIVE
Lyme Disease 39 kD IgG: REACTIVE — AB
Lyme Disease 41 kD IgG: NONREACTIVE
Lyme Disease 45 kD IgG: NONREACTIVE
Lyme Disease 66 kD IgG: NONREACTIVE
Lyme Disease 93 kD IgG: NONREACTIVE

## 2017-01-18 ENCOUNTER — Encounter: Payer: Self-pay | Admitting: Family Medicine

## 2017-02-08 DIAGNOSIS — M2550 Pain in unspecified joint: Secondary | ICD-10-CM | POA: Diagnosis not present

## 2017-02-08 DIAGNOSIS — Z6841 Body Mass Index (BMI) 40.0 and over, adult: Secondary | ICD-10-CM | POA: Diagnosis not present

## 2017-02-08 DIAGNOSIS — M79672 Pain in left foot: Secondary | ICD-10-CM | POA: Diagnosis not present

## 2017-02-08 DIAGNOSIS — M545 Low back pain: Secondary | ICD-10-CM | POA: Diagnosis not present

## 2017-02-08 DIAGNOSIS — R700 Elevated erythrocyte sedimentation rate: Secondary | ICD-10-CM | POA: Diagnosis not present

## 2017-02-08 DIAGNOSIS — M79671 Pain in right foot: Secondary | ICD-10-CM | POA: Diagnosis not present

## 2017-02-08 DIAGNOSIS — M797 Fibromyalgia: Secondary | ICD-10-CM | POA: Diagnosis not present

## 2017-02-08 DIAGNOSIS — R5383 Other fatigue: Secondary | ICD-10-CM | POA: Diagnosis not present

## 2017-02-09 ENCOUNTER — Other Ambulatory Visit: Payer: Self-pay | Admitting: Physician Assistant

## 2017-02-09 ENCOUNTER — Other Ambulatory Visit (INDEPENDENT_AMBULATORY_CARE_PROVIDER_SITE_OTHER): Payer: 59

## 2017-02-09 DIAGNOSIS — M2550 Pain in unspecified joint: Secondary | ICD-10-CM

## 2017-02-09 LAB — URIC ACID: URIC ACID, SERUM: 5.5 mg/dL (ref 2.4–7.0)

## 2017-02-09 LAB — CK: CK TOTAL: 59 U/L (ref 7–177)

## 2017-02-09 NOTE — Addendum Note (Signed)
Addended by: Loraine Grip on: 02/09/2017 08:50 AM   Modules accepted: Orders

## 2017-02-09 NOTE — Addendum Note (Signed)
Addended by: Loraine Grip on: 02/09/2017 08:48 AM   Modules accepted: Orders

## 2017-02-10 LAB — HIGH SENSITIVITY CRP: CRP, High Sensitivity: 24 mg/L — ABNORMAL HIGH

## 2017-02-15 LAB — HLA-B27 ANTIGEN: DNA RESULT: NEGATIVE

## 2017-02-24 MED FILL — traMADol HCL 50 MG TABS: 50 | 30 days supply | Qty: 60 | Fill #0

## 2017-03-01 ENCOUNTER — Other Ambulatory Visit: Payer: Self-pay | Admitting: Family Medicine

## 2017-03-01 ENCOUNTER — Ambulatory Visit
Admission: RE | Admit: 2017-03-01 | Discharge: 2017-03-01 | Disposition: A | Discharge status: Home / Self Care | Payer: 59 | Source: Ambulatory Visit | Attending: Family Medicine | Admitting: Family Medicine

## 2017-03-01 DIAGNOSIS — Z1231 Encounter for screening mammogram for malignant neoplasm of breast: Secondary | ICD-10-CM | POA: Diagnosis not present

## 2017-03-09 ENCOUNTER — Telehealth: Payer: Self-pay | Admitting: *Deleted

## 2017-03-09 NOTE — Telephone Encounter (Signed)
Nancy [name] faxed a refill request for Gabapentin 300mg  #180-take 1 capsule by mouth twice daily as needed.  I did not see where this was prescribed by Dr Elease Hashimoto.  Is this OK to refill?

## 2017-03-10 MED ORDER — GABAPENTIN 300 MG PO CAPS: 300.0000 mg | ORAL_CAPSULE | Freq: Two times a day (BID) | ORAL | 1 refills | Status: DC

## 2017-03-10 MED FILL — GABAPENTIN 300 MG CAPS: 300 | 90 days supply | Qty: 180 | Fill #0

## 2017-03-10 NOTE — Telephone Encounter (Signed)
Refill with 1 additional refill 

## 2017-03-10 NOTE — Telephone Encounter (Signed)
Rx done. 

## 2017-04-20 ENCOUNTER — Encounter: Payer: Self-pay | Admitting: Family Medicine

## 2017-04-25 MED FILL — HYDROCHLOROTHIAZIDE 25 MG T: 25 | 90 days supply | Qty: 90 | Fill #1

## 2017-05-03 DIAGNOSIS — R3914 Feeling of incomplete bladder emptying: Secondary | ICD-10-CM | POA: Diagnosis not present

## 2017-05-03 DIAGNOSIS — R350 Frequency of micturition: Secondary | ICD-10-CM | POA: Diagnosis not present

## 2017-05-15 ENCOUNTER — Other Ambulatory Visit: Payer: Self-pay | Admitting: Physician Assistant

## 2017-05-15 MED ORDER — CIPROFLOXACIN HCL 500 MG PO TABS: 500.0000 mg | ORAL_TABLET | Freq: Two times a day (BID) | ORAL | 0 refills | Status: AC

## 2017-05-15 MED FILL — CIPROFLOXACIN HCL 500 MG TA: 500 | 3 days supply | Qty: 6 | Fill #0

## 2017-06-28 ENCOUNTER — Encounter: Payer: Self-pay | Admitting: Family Medicine

## 2017-06-28 ENCOUNTER — Ambulatory Visit (INDEPENDENT_AMBULATORY_CARE_PROVIDER_SITE_OTHER): Payer: 59 | Admitting: Family Medicine

## 2017-06-28 VITALS — BP 102/64 | HR 64 | Temp 98.3°F | Ht 62.5 in | Wt 230.0 lb

## 2017-06-28 DIAGNOSIS — Z0000 Encounter for general adult medical examination without abnormal findings: Secondary | ICD-10-CM | POA: Diagnosis not present

## 2017-06-28 DIAGNOSIS — R002 Palpitations: Secondary | ICD-10-CM

## 2017-06-28 NOTE — Patient Instructions (Signed)
Check on coverage for new shingles vaccine if interested.

## 2017-06-28 NOTE — Progress Notes (Signed)
Subjective:     Patient ID: Nancy [name], female   DOB: [birth year], 51 y.o.   MRN: [id number]  HPI Patient seen for physical exam. She's had previous hysterectomy several years ago. Mammograms up-to-date. No history of recent hepatitis C screening. Colonoscopy up-to-date. No history of new shingles vaccine.  She's had some intermittent palpitations. No clear triggering factors. Occur day and night and not associated with any dizziness or presyncopal or syncopal type symptoms. She's had previous event monitoring which was unrevealing. Does drink some caffeine. No regular alcohol.  Past Medical History:  Diagnosis Date  . Asthma   . Bradycardia   . Chicken pox   . Dysrhythmia    occ. palpitations  . Headache(784.0)   . Migraines   . Neck pain    bulging disk  . PONV (postoperative nausea and vomiting)   . Shortness of breath    with bradycardia   Past Surgical History:  Procedure Laterality Date  . BLADDER SURGERY  July 10th 2013  . [place]  . DIAGNOSTIC LAPAROSCOPY    . LAPAROSCOPY Bilateral 05/31/2013   Procedure: LAPAROSCOPY withbilateral salpingo-oophorectomy, lysis of adhesions;  Surgeon: Allena Katz, MD;  Location: [facility];  Service: Gynecology;  Laterality: Bilateral;  with clippings of Vaginal  mesh  . SALPINGOOPHORECTOMY Bilateral 05/31/2013   Procedure: BILATERAL SALPINGO OOPHORECTOMY/TRIM MESH;  Surgeon: Allena Katz, MD;  Location: [facility];  Service: Gynecology;  Laterality: Bilateral;  . TUBAL LIGATION     1995  . VAGINAL HYSTERECTOMY     1998 or 1997  . WRIST FRACTURE SURGERY  2005    reports that  has never smoked. she has never used smokeless tobacco. She reports that she drinks alcohol. She reports that she does not use drugs. family history includes Arthritis in her mother; Cancer in her maternal grandfather and paternal grandmother; Colon cancer in her maternal grandfather; Diabetes in her maternal grandmother and  paternal grandmother; Heart disease in her maternal grandfather and maternal grandmother; Hyperlipidemia in her mother; Hypertension in her father. Allergies  Allergen Reactions  . Advair Diskus [Fluticasone-Salmeterol] Cough    excessive  . Erythromycin Diarrhea and Nausea And Vomiting  . Percocet [Oxycodone-Acetaminophen] Other (See Comments)    hallucinations     Review of Systems  Constitutional: Negative for activity change, appetite change, fatigue, fever and unexpected weight change.  HENT: Negative for ear pain, hearing loss, sore throat and trouble swallowing.   Eyes: Negative for visual disturbance.  Respiratory: Negative for cough and shortness of breath.   Cardiovascular: Positive for palpitations. Negative for chest pain and leg swelling.  Gastrointestinal: Negative for abdominal pain, blood in stool, constipation and diarrhea.  Endocrine: Negative for polydipsia and polyuria.  Genitourinary: Negative for dysuria and hematuria.  Musculoskeletal: Positive for arthralgias. Negative for back pain and myalgias.  Skin: Negative for rash.  Neurological: Negative for dizziness, syncope and headaches.  Hematological: Negative for adenopathy.  Psychiatric/Behavioral: Negative for confusion and dysphoric mood.       Objective:   Physical Exam  Constitutional: She is oriented to person, place, and time. She appears well-developed and well-nourished.  HENT:  Head: Normocephalic and atraumatic.  Eyes: EOM are normal. Pupils are equal, round, and reactive to light.  Neck: Normal range of motion. Neck supple. No thyromegaly present.  Cardiovascular: Normal rate, regular rhythm and normal heart sounds.  No murmur heard. Pulmonary/Chest: Breath sounds normal. No respiratory distress. She has no wheezes.  She has no rales.  Breasts symmetric with no mass.  Abdominal: Soft. Bowel sounds are normal. She exhibits no distension and no mass. There is no tenderness. There is no rebound and  no guarding.  Musculoskeletal: Normal range of motion. She exhibits no edema.  Lymphadenopathy:    She has no cervical adenopathy.  Neurological: She is alert and oriented to person, place, and time. She displays normal reflexes. No cranial nerve deficit.  Skin: No rash noted.  Psychiatric: She has a normal mood and affect. Her behavior is normal. Judgment and thought content normal.       Assessment:     Physical exam-several issues addressed as below. She has intermittent palpitations which most likely represent PACs or PVCs. No worrisome symptoms such as syncopal or presyncopal symptoms    Plan:     -EKG shows normal sinus rhythm with no acute changes -Future order for labs. Patient requesting repeat vitamin D level -Check hepatitis C antibody -Check on coverage for shingles vaccine  Eulas Post MD  Primary Care at [facility]

## 2017-06-29 ENCOUNTER — Other Ambulatory Visit (INDEPENDENT_AMBULATORY_CARE_PROVIDER_SITE_OTHER): Payer: 59

## 2017-06-29 ENCOUNTER — Encounter: Payer: Self-pay | Admitting: Family Medicine

## 2017-06-29 DIAGNOSIS — R7989 Other specified abnormal findings of blood chemistry: Secondary | ICD-10-CM

## 2017-06-29 DIAGNOSIS — Z0000 Encounter for general adult medical examination without abnormal findings: Secondary | ICD-10-CM

## 2017-06-29 LAB — BASIC METABOLIC PANEL
BUN: 17 mg/dL (ref 6–23)
CALCIUM: 9.9 mg/dL (ref 8.4–10.5)
CO2: 33 mEq/L — ABNORMAL HIGH (ref 19–32)
CREATININE: 0.76 mg/dL (ref 0.40–1.20)
Chloride: 97 mEq/L (ref 96–112)
GFR: 84.33 mL/min (ref >60.00)
GLUCOSE: 94 mg/dL (ref 70–99)
Potassium: 3.9 mEq/L (ref 3.5–5.1)
SODIUM: 138 meq/L (ref 135–145)

## 2017-06-29 LAB — LIPID PANEL
Cholesterol: 222 mg/dL — ABNORMAL HIGH (ref 0–200)
HDL: 67.9 mg/dL (ref >39.00)
LDL CALC: 129 mg/dL — AB (ref 0–99)
NONHDL: 154.14
Total CHOL/HDL Ratio: 3
Triglycerides: 126 mg/dL (ref 0.0–149.0)
VLDL: 25.2 mg/dL (ref 0.0–40.0)

## 2017-06-29 LAB — CBC WITH DIFFERENTIAL/PLATELET
BASOS PCT: 0.5 % (ref 0.0–3.0)
Basophils Absolute: 0 10*3/uL (ref 0.0–0.1)
EOS ABS: 0.1 10*3/uL (ref 0.0–0.7)
EOS PCT: 2.3 % (ref 0.0–5.0)
HCT: 42.4 % (ref 36.0–46.0)
Hemoglobin: 13.5 g/dL (ref 12.0–15.0)
LYMPHS ABS: 1.8 10*3/uL (ref 0.7–4.0)
Lymphocytes Relative: 33.3 % (ref 12.0–46.0)
MCHC: 31.8 g/dL (ref 30.0–36.0)
MCV: 83.8 fl (ref 78.0–100.0)
MONO ABS: 0.4 10*3/uL (ref 0.1–1.0)
Monocytes Relative: 8 % (ref 3.0–12.0)
Neutro Abs: 3 10*3/uL (ref 1.4–7.7)
Neutrophils Relative %: 55.9 % (ref 43.0–77.0)
Platelets: 189 10*3/uL (ref 150.0–400.0)
RBC: 5.06 Mil/uL (ref 3.87–5.11)
RDW: 13.7 % (ref 11.5–15.5)
WBC: 5.3 10*3/uL (ref 4.0–10.5)

## 2017-06-29 LAB — HEPATIC FUNCTION PANEL
ALK PHOS: 87 U/L (ref 39–117)
ALT: 12 U/L (ref 0–35)
AST: 14 U/L (ref 0–37)
Albumin: 4.3 g/dL (ref 3.5–5.2)
BILIRUBIN DIRECT: 0.1 mg/dL (ref 0.0–0.3)
TOTAL PROTEIN: 7.7 g/dL (ref 6.0–8.3)
Total Bilirubin: 0.5 mg/dL (ref 0.2–1.2)

## 2017-06-29 LAB — VITAMIN D 25 HYDROXY (VIT D DEFICIENCY, FRACTURES): VITD: 20.18 ng/mL — ABNORMAL LOW (ref 30.00–100.00)

## 2017-06-29 LAB — TSH: TSH: 1.34 u[IU]/mL (ref 0.35–4.50)

## 2017-06-29 LAB — SEDIMENTATION RATE: Sed Rate: 42 mm/hr — ABNORMAL HIGH (ref 0–30)

## 2017-06-30 ENCOUNTER — Encounter: Payer: Self-pay | Admitting: Family Medicine

## 2017-06-30 LAB — HEPATITIS C ANTIBODY
HEP C AB: NONREACTIVE
SIGNAL TO CUT-OFF: 0.01 (ref <1.00)

## 2017-06-30 MED ORDER — VITAMIN D (ERGOCALCIFEROL) 1.25 MG (50000 UNIT) PO CAPS: 50000.0000 [IU] | ORAL_CAPSULE | ORAL | 1 refills | Status: DC

## 2017-07-06 MED FILL — VIT D2 1.25 MG (50,000 UNIT: 1.25 MG | 84 days supply | Qty: 12 | Fill #0

## 2017-08-15 ENCOUNTER — Encounter: Payer: Self-pay | Admitting: Family Medicine

## 2017-08-15 MED ORDER — TRAMADOL HCL 50 MG PO TABS: ORAL_TABLET | ORAL | 1 refills | Status: DC

## 2017-08-15 MED ORDER — HYDROCHLOROTHIAZIDE 25 MG PO TABS: ORAL_TABLET | ORAL | 1 refills | Status: DC

## 2017-08-15 MED FILL — HYDROCHLOROTHIAZIDE 25 MG T: 25 | 90 days supply | Qty: 90 | Fill #0

## 2017-08-16 MED FILL — traMADol HCL 50 MG TABS: 50 | 90 days supply | Qty: 180 | Fill #0

## 2017-08-22 MED FILL — GABAPENTIN 300 MG CAPSULE: 300 | 90 days supply | Qty: 180 | Fill #1 | Status: TO

## 2017-08-23 ENCOUNTER — Encounter: Payer: Self-pay | Admitting: Family Medicine

## 2017-08-25 ENCOUNTER — Encounter: Payer: Self-pay | Admitting: Family Medicine

## 2017-08-25 MED ORDER — ESTRADIOL 0.1 MG/GM VA CREA: TOPICAL_CREAM | VAGINAL | 2 refills | Status: DC

## 2017-08-25 MED FILL — ESTRADIOL 0.1 MG/GM CRM: 0.1 | 36 days supply | Qty: 43 | Fill #0

## 2017-09-09 ENCOUNTER — Other Ambulatory Visit: Payer: Self-pay | Admitting: Family Medicine

## 2017-09-09 MED ORDER — OSELTAMIVIR PHOSPHATE 75 MG PO CAPS: 75.0000 mg | ORAL_CAPSULE | Freq: Every day | ORAL | 0 refills | Status: DC

## 2017-10-04 ENCOUNTER — Other Ambulatory Visit (INDEPENDENT_AMBULATORY_CARE_PROVIDER_SITE_OTHER): Payer: 59

## 2017-10-04 DIAGNOSIS — R7989 Other specified abnormal findings of blood chemistry: Secondary | ICD-10-CM | POA: Diagnosis not present

## 2017-10-05 ENCOUNTER — Encounter: Payer: Self-pay | Admitting: Family Medicine

## 2017-10-05 LAB — VITAMIN D 25 HYDROXY (VIT D DEFICIENCY, FRACTURES): VITD: 31.52 ng/mL (ref 30.00–100.00)

## 2017-12-18 ENCOUNTER — Ambulatory Visit (INDEPENDENT_AMBULATORY_CARE_PROVIDER_SITE_OTHER): Payer: 59 | Admitting: Family Medicine

## 2017-12-18 ENCOUNTER — Encounter: Payer: Self-pay | Admitting: Family Medicine

## 2017-12-18 MED ORDER — PHENTERMINE HCL 37.5 MG PO TABS: 37.5000 mg | ORAL_TABLET | Freq: Every day | ORAL | 2 refills | Status: DC

## 2017-12-18 MED FILL — PHENTERMINE 37.5 MG TABLET: 37.5 | 30 days supply | Qty: 30 | Fill #0

## 2017-12-22 NOTE — Progress Notes (Signed)
Nancy [name] is a 52 y.o. female is here for follow up.  History of Present Illness:   HPI: Interested in weight loss. Exercising regularly. Trying to eat well. OA of knees bilaterally. Hx of fibromyalgia. Hx of palpitations - controlled.   Health Maintenance Due  Topic Date Due  . HIV Screening  08/18/1978  . PAP SMEAR  08/18/1984   No flowsheet data found. PMHx, SurgHx, SocialHx, FamHx, Medications, and Allergies were reviewed in the Visit Navigator and updated as appropriate.   Patient Active Problem List   Diagnosis Date Noted  . Cervicogenic headache 03/26/2014  . Cervical radiculopathy 03/26/2014  . Mild intermittent asthma 11/20/2013  . Bradycardia 07/04/2012  . Fatigue 07/04/2012   Social History   Tobacco Use  . Smoking status: Never Smoker  . Smokeless tobacco: Never Used  Substance Use Topics  . Alcohol use: Yes    Alcohol/week: 0.0 oz    Comment: occasional; just on special occasion  . Drug use: No   Current Medications and Allergies:   Current Outpatient Medications:  .  acetaminophen (TYLENOL) 500 MG tablet, Take 500 mg by mouth every 6 (six) hours as needed., Disp: , Rfl:  .  albuterol (PROVENTIL HFA;VENTOLIN HFA) 108 (90 BASE) MCG/ACT inhaler, Inhale 2 puffs into the lungs every 6 (six) hours as needed for wheezing or shortness of breath (rescue)., Disp: 1 Inhaler, Rfl: 5 .  aspirin-acetaminophen-caffeine (EXCEDRIN MIGRAINE) 250-250-65 MG tablet, Take by mouth every 6 (six) hours as needed for headache., Disp: , Rfl:  .  Cholecalciferol (VITAMIN D) 2000 units CAPS, Take by mouth., Disp: , Rfl:  .  estradiol (ESTRACE VAGINAL) 0.1 MG/GM vaginal cream, apply 2 g to vagina daily for 2 weeks then taper dose to 2gms two times per week, Disp: 42.5 g, Rfl: 2 .  gabapentin (NEURONTIN) 300 MG capsule, Take 1 capsule (300 mg total) by mouth 2 (two) times daily., Disp: 180 capsule, Rfl: 1 .  hydrochlorothiazide (HYDRODIURIL) 25 MG tablet, TAKE 1 TABLET BY MOUTH  ONCE DAILY AS NEEDED FOR EDEMA, Disp: 90 tablet, Rfl: 1 .  Melatonin 3 MG TABS, Take 1 tablet by mouth at bedtime as needed (sleep)., Disp: , Rfl:  .  Naproxen Sodium (ALEVE) 220 MG CAPS, Take by mouth., Disp: , Rfl:  .  oseltamivir (TAMIFLU) 75 MG capsule, Take 1 capsule (75 mg total) by mouth daily., Disp: 10 capsule, Rfl: 0 .  traMADol (ULTRAM) 50 MG tablet, Take one tablet twice a day as needed for moderate pain., Disp: 180 tablet, Rfl: 1 .  Vitamin D, Ergocalciferol, (DRISDOL) 50000 units CAPS capsule, Take 1 capsule (50,000 Units total) by mouth every 7 (seven) days., Disp: 30 capsule, Rfl: 1    Allergies  Allergen Reactions  . Advair Diskus [Fluticasone-Salmeterol] Cough    excessive  . Erythromycin Diarrhea and Nausea And Vomiting  . Percocet [Oxycodone-Acetaminophen] Other (See Comments)    hallucinations   Review of Systems   Pertinent items are noted in the HPI. Otherwise, ROS is negative.  Vitals:   Vitals:   12/18/17 1121  BP: 110/68  Pulse: (!) 50  Temp: 98.6 F (37 C)  TempSrc: Oral  SpO2: 96%  Weight: 233 lb 9.6 oz (106 kg)     Body mass index is 42.05 kg/m.  Physical Exam:   Physical Exam  Constitutional: She appears well-nourished.  HENT:  Head: Normocephalic and atraumatic.  Eyes: Pupils are equal, round, and reactive to light. EOM are normal.  Neck: Normal range  of motion. Neck supple.  Cardiovascular: Normal rate, regular rhythm, normal heart sounds and intact distal pulses.  Pulmonary/Chest: Effort normal.  Abdominal: Soft.  Skin: Skin is warm.  Psychiatric: She has a normal mood and affect. Her behavior is normal.  Nursing note and vitals reviewed.    Results for orders placed or performed in visit on 10/04/17  Vitamin D, 25-hydroxy  Result Value Ref Range   VITD 31.52 30.00 - 100.00 ng/mL    Assessment and Plan:   Nancy [name] was seen today for medication problem.  Diagnoses and all orders for this visit:  Morbid obesity ([place]) -      phentermine (ADIPEX-P) 37.5 MG tablet; Take 1 tablet (37.5 mg total) by mouth daily before breakfast.    . Reviewed expectations re: course of current medical issues. . Discussed self-management of symptoms. . Outlined signs and symptoms indicating need for more acute intervention. . Patient verbalized understanding and all questions were answered. Marland Kitchen Health Maintenance issues including appropriate healthy diet, exercise, and smoking avoidance were discussed with patient. . See orders for this visit as documented in the electronic medical record. . Patient received an After Visit Summary.  Briscoe Deutscher, DO [place] 12/23/2017  Future Appointments  Date Time Provider [place]  12/28/2017  2:00 PM Gerda Diss, DO [facility]

## 2017-12-23 ENCOUNTER — Encounter: Payer: Self-pay | Admitting: Family Medicine

## 2017-12-28 ENCOUNTER — Ambulatory Visit (INDEPENDENT_AMBULATORY_CARE_PROVIDER_SITE_OTHER): Payer: 59 | Admitting: Sports Medicine

## 2017-12-28 ENCOUNTER — Encounter: Payer: Self-pay | Admitting: Sports Medicine

## 2017-12-28 ENCOUNTER — Ambulatory Visit: Payer: Self-pay

## 2017-12-28 VITALS — BP 122/76 | HR 64 | Ht 62.5 in | Wt 231.2 lb

## 2017-12-28 DIAGNOSIS — M25561 Pain in right knee: Secondary | ICD-10-CM | POA: Diagnosis not present

## 2017-12-28 DIAGNOSIS — M25562 Pain in left knee: Principal | ICD-10-CM

## 2017-12-28 DIAGNOSIS — M170 Bilateral primary osteoarthritis of knee: Secondary | ICD-10-CM | POA: Diagnosis not present

## 2017-12-28 NOTE — Patient Instructions (Signed)

## 2017-12-28 NOTE — Procedures (Signed)
PROCEDURE NOTE:  Ultrasound Guided: Injection: Bilateral knee Images were obtained and interpreted by myself, Teresa Coombs, DO  Images have been saved and stored to PACS system. Images obtained on: GE S7 Ultrasound machine    ULTRASOUND FINDINGS:  Generalized synovitis with thickening of the synovium bilaterally.  Minimal effusion on the right, small effusion on the left.  DESCRIPTION OF PROCEDURE:  The patient's clinical condition is marked by substantial pain and/or significant functional disability. Other conservative therapy has not provided relief, is contraindicated, or not appropriate. There is a reasonable likelihood that injection will significantly improve the patient's pain and/or functional impairment.   After discussing the risks, benefits and expected outcomes of the injection and all questions were reviewed and answered, the patient wished to undergo the above named procedure.  Verbal consent was obtained.  The ultrasound was used to identify the target structure and adjacent neurovascular structures. The skin was then prepped in sterile fashion and the target structure was injected under direct visualization using sterile technique as below: Identical procedures were performed of the right followed by left knee as below.  PREP: Alcohol and Ethel Chloride APPROACH: superiolateral, single injection, 25g 1.5 in. INJECTATE: 2 cc 0.5% Marcaine and 1 cc 40mg /mL DepoMedrol ASPIRATE: None DRESSING: Band-Aid  Post procedural instructions including recommending icing and warning signs for infection were reviewed.    This procedure was well tolerated and there were no complications.   IMPRESSION: Succesful Ultrasound Guided: Injection

## 2017-12-28 NOTE — Progress Notes (Signed)
[name]. [place], [place] [facility] [place]  Sathvika Ojo - 52 y.o. female MRN [id number]  Date of birth: 03/26/1966  Visit Date: 12/28/2017  PCP: Eulas Post, MD   Referred by: Eulas Post, MD  Scribe for today's visit: [name], LAT, ATC     SUBJECTIVE:  Nancy [name] is here for New Patient (Initial Visit) (B knee pain) .   Her B knee pain symptoms INITIALLY: Began about 4 years ago w/ no specific MOI.  She notes that she was working out at [employer] and started having problems w/ her R knee.  She went to see her rheumatologist and received a R knee injection.  She notes that she was informed that she has a R knee meniscal tear.  She has most recently had a L knee injection in Dec 2018 w/ Dr. Jonni Sanger.  She reports that her R knee is currently painful anteriorly and the L knee is most painful laterally and posteriorly.  She has been diagnosed w/ fibromyalgia approximately 4 years prior. Described as mild, sharp pain in her R knee and aching and throbbing in her L knee, nonradiating Worsened with weight bearing activity Improved with pain medication - has taken Aleve and Tramadol; ice and rest Additional associated symptoms include: reports popping/clicking in B knees; B knee swelling; no N/T reported in B LEs    At this time symptoms are worsening compared to onset due to increased intensity and frequency of pain. She has been taking medications prn, ices prn.  She has had 2 knee injections in the past and feels like they helped.  ROS Reports night time disturbances. Reports fevers, chills, or night sweats.  Yes to night sweats. Denies unexplained weight loss. Denies personal history of cancer. Denies changes in bowel or bladder habits. Reports recent unreported falls. Denies new or worsening dyspnea or wheezing. Denies headaches or dizziness.  Denies numbness, tingling or weakness  In the extremities.  Denies  dizziness or presyncopal episodes Reports lower extremity edema - in B knees.   HISTORY & PERTINENT PRIOR DATA:  Prior History reviewed and updated per electronic medical record.  Significant/pertinent history, findings, studies include:  reports that she has never smoked. She has never used smokeless tobacco. Recent Labs    02/09/17 0834  LABURIC 5.5   No specialty comments available. No problems updated.  OBJECTIVE:  VS:  HT:5' 2.5" (158.8 cm)   WT:231 lb 3.2 oz (104.9 kg)  BMI:41.59    BP:122/76  HR:64bpm  TEMP: ( )  RESP:97 %   PHYSICAL EXAM: Constitutional: WDWN, Non-toxic appearing. Psychiatric: Alert & appropriately interactive.  Not depressed or anxious appearing. Respiratory: No increased work of breathing.  Trachea Midline Eyes: Pupils are equal.  EOM intact without nystagmus.  No scleral icterus  Vascular Exam: warm to touch no edema  lower extremity neuro exam: unremarkable normal strength normal sensation  MSK Exam: Bilateral knees with overall good alignment.  She has 3 to 4 mm of opening with varus testing on the right compared to the left.  Flexor and extensor range of motion is normal.  Extensor mechanism strength is intact although poor VMO definition bilaterally.  Large soft tissue envelope.   ASSESSMENT & PLAN:   1. Pain in both knees, unspecified chronicity   2. Primary osteoarthritis of both knees     PLAN: Bilateral knee injections performed today per procedure note.  Discussed therapeutic strengthening exercises including straight leg  raises for VMO.  Any lack of improvement can consider Zilretta injections in the prior authorization was obtained for this today.  Follow-up: Return if symptoms worsen or fail to improve.      Please see additional documentation for Objective, Assessment and Plan sections. Pertinent additional documentation may be included in corresponding procedure notes, imaging studies, problem based documentation and  patient instructions. Please see these sections of the encounter for additional information regarding this visit.  CMA/ATC served as Education administrator during this visit. History, Physical, and Plan performed by medical provider. Documentation and orders reviewed and attested to.      Gerda Diss, [facility] Physician

## 2018-01-17 ENCOUNTER — Other Ambulatory Visit: Payer: Self-pay | Admitting: *Deleted

## 2018-01-17 MED ORDER — HYDROCHLOROTHIAZIDE 25 MG PO TABS: ORAL_TABLET | ORAL | 1 refills | Status: DC

## 2018-01-17 MED FILL — HYDROCHLOROTHIAZIDE 25 MG T: 25 | 90 days supply | Qty: 90 | Fill #0

## 2018-02-09 ENCOUNTER — Ambulatory Visit: Payer: 59 | Admitting: Family Medicine

## 2018-02-16 ENCOUNTER — Other Ambulatory Visit: Payer: Self-pay | Admitting: Family Medicine

## 2018-02-16 MED FILL — GABAPENTIN 300 MG CAPSULE: 300 | 90 days supply | Qty: 180 | Fill #0

## 2018-03-02 ENCOUNTER — Other Ambulatory Visit: Payer: Self-pay | Admitting: Surgical

## 2018-03-02 MED ORDER — TRIAMTERENE-HCTZ 37.5-25 MG PO CAPS: 1.0000 | ORAL_CAPSULE | Freq: Every day | ORAL | 1 refills | Status: DC

## 2018-03-02 MED FILL — TRIAMTERENE/HCTZ 37.5/25 CP: 37.5-25 | 30 days supply | Qty: 30 | Fill #0

## 2018-03-15 DIAGNOSIS — N3941 Urge incontinence: Secondary | ICD-10-CM | POA: Diagnosis not present

## 2018-03-15 DIAGNOSIS — R3914 Feeling of incomplete bladder emptying: Secondary | ICD-10-CM | POA: Diagnosis not present

## 2018-03-21 MED FILL — PHENTERMINE 37.5 MG TABLET: 37.5 | 30 days supply | Qty: 30 | Fill #1

## 2018-04-12 ENCOUNTER — Encounter: Payer: Self-pay | Admitting: Family Medicine

## 2018-04-12 ENCOUNTER — Ambulatory Visit: Payer: 59 | Admitting: Family Medicine

## 2018-04-12 VITALS — BP 122/72 | HR 94 | Temp 97.7°F | Ht 62.5 in | Wt 230.0 lb

## 2018-04-12 DIAGNOSIS — R609 Edema, unspecified: Secondary | ICD-10-CM

## 2018-04-12 DIAGNOSIS — G4700 Insomnia, unspecified: Secondary | ICD-10-CM | POA: Diagnosis not present

## 2018-04-12 DIAGNOSIS — N898 Other specified noninflammatory disorders of vagina: Secondary | ICD-10-CM | POA: Diagnosis not present

## 2018-04-12 DIAGNOSIS — R001 Bradycardia, unspecified: Secondary | ICD-10-CM

## 2018-04-12 DIAGNOSIS — Z114 Encounter for screening for human immunodeficiency virus [HIV]: Secondary | ICD-10-CM | POA: Diagnosis not present

## 2018-04-12 DIAGNOSIS — Z86010 Personal history of colonic polyps: Secondary | ICD-10-CM | POA: Diagnosis not present

## 2018-04-12 DIAGNOSIS — M797 Fibromyalgia: Secondary | ICD-10-CM | POA: Insufficient documentation

## 2018-04-12 DIAGNOSIS — R002 Palpitations: Secondary | ICD-10-CM | POA: Diagnosis not present

## 2018-04-12 DIAGNOSIS — Z90710 Acquired absence of both cervix and uterus: Secondary | ICD-10-CM | POA: Insufficient documentation

## 2018-04-12 DIAGNOSIS — E559 Vitamin D deficiency, unspecified: Secondary | ICD-10-CM | POA: Insufficient documentation

## 2018-04-12 NOTE — Patient Instructions (Signed)
Please stop by lab before you go   

## 2018-04-12 NOTE — Progress Notes (Signed)
Phone: [phone]  Subjective:  Patient presents today to establish care with me as their new primary care provider. Patient was formerly a patient of Dr. Elease Hashimoto. Chief complaint-noted.   See problem oriented charting ROS- issues with fatigue and myalgias. Has palpitations. No chest pain reported. Has edema without shortness of breath.   The following were reviewed and entered/updated in epic: Past Medical History:  Diagnosis Date  . Asthma   . Bradycardia   . Chicken pox   . Dysrhythmia    occ. palpitations  . Headache(784.0)   . Migraines   . Neck pain    bulging disk  . PONV (postoperative nausea and vomiting)   . Shortness of breath    with bradycardia   Patient Active Problem List   Diagnosis Date Noted  . Fibromyalgia 04/12/2018    Priority: High  . Vaginal dryness 04/12/2018    Priority: Medium  . Morbid obesity ([place]) 04/12/2018    Priority: Medium  . History of colon polyps 04/12/2018    Priority: Medium  . Primary osteoarthritis of both knees 12/28/2017    Priority: Medium  . Mild intermittent asthma 11/20/2013    Priority: Medium  . Bradycardia 07/04/2012    Priority: Medium  . Vitamin D deficiency 04/12/2018    Priority: Low  . History of total hysterectomy 04/12/2018    Priority: Low  . Insomnia 04/12/2018    Priority: Low  . Cervicogenic headache 03/26/2014    Priority: Low  . Cervical radiculopathy 03/26/2014    Priority: Low   Past Surgical History:  Procedure Laterality Date  . BLADDER SURGERY  July 10th 2013  . [place]  . DIAGNOSTIC LAPAROSCOPY    . LAPAROSCOPY Bilateral 05/31/2013   Procedure: LAPAROSCOPY withbilateral salpingo-oophorectomy, lysis of adhesions;  Surgeon: Allena Katz, MD;  Location: [facility];  Service: Gynecology;  Laterality: Bilateral;  with clippings of Vaginal  mesh  . SALPINGOOPHORECTOMY Bilateral 05/31/2013   Procedure: BILATERAL SALPINGO OOPHORECTOMY/TRIM MESH;  Surgeon: Allena Katz, MD;  Location: [facility];  Service: Gynecology;  Laterality: Bilateral;  . TUBAL LIGATION     1995  . VAGINAL HYSTERECTOMY     1998 or 1997  . WRIST FRACTURE SURGERY  2005    Family History  Problem Relation Age of Onset  . Hyperlipidemia Mother        51 in 2019  . Arthritis Mother   . Osteoporosis Mother   . Lumbar disc disease Mother        herniated discs.   . Hypertension Father        doesnt know his history well- no recent contact  . Heart disease Maternal Grandmother   . Diabetes Maternal Grandmother   . Heart disease Maternal Grandfather   . Cancer - Colon Maternal Grandfather   . Diabetes Paternal Grandmother   . Lung cancer Paternal Grandmother        dad's parents stepped in to help when dad not present  . Other Sister        doesnt know history well  . Lumbar disc disease Brother        grew up with him   . Atrial fibrillation Paternal Grandfather        required a few shocks  . Bradycardia Paternal Grandfather   . Hypertension Paternal Grandfather   . Other Brother        doesnt know history well    Medications-  reviewed and updated Current Outpatient Medications  Medication Sig Dispense Refill  . acetaminophen (TYLENOL) 500 MG tablet Take 500 mg by mouth every 6 (six) hours as needed.    Marland Kitchen albuterol (PROVENTIL HFA;VENTOLIN HFA) 108 (90 BASE) MCG/ACT inhaler Inhale 2 puffs into the lungs every 6 (six) hours as needed for wheezing or shortness of breath (rescue). 1 Inhaler 5  . Cholecalciferol (VITAMIN D) 2000 units CAPS Take by mouth.    . estradiol (ESTRACE VAGINAL) 0.1 MG/GM vaginal cream apply 2 g to vagina daily for 2 weeks then taper dose to 2gms two times per week 42.5 g 2  . gabapentin (NEURONTIN) 300 MG capsule TAKE 1 CAPSULE BY MOUTH 2 TIMES DAILY. 180 capsule 1  . hydrochlorothiazide (HYDRODIURIL) 25 MG tablet TAKE 1 TABLET BY MOUTH ONCE DAILY AS NEEDED FOR EDEMA 90 tablet 1  . Melatonin 3 MG TABS Take 1 tablet by mouth at bedtime as  needed (sleep).    . Naproxen Sodium (ALEVE) 220 MG CAPS Take by mouth.    . traMADol (ULTRAM) 50 MG tablet Take one tablet twice a day as needed for moderate pain. 180 tablet 1  . phentermine (ADIPEX-P) 37.5 MG tablet Take 1 tablet (37.5 mg total) by mouth daily before breakfast. (Patient not taking: Reported on 04/12/2018) 30 tablet 2   Allergies-reviewed and updated Allergies  Allergen Reactions  . Advair Diskus [Fluticasone-Salmeterol] Cough    excessive  . Erythromycin Diarrhea and Nausea And Vomiting  . Percocet [Oxycodone-Acetaminophen] Other (See Comments)    hallucinations    Social History   Social History Narrative   Married to husband Shanon Brow. Son Gerald Stabs and Daughter Reggy Eye.       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       Hobbies: movies, reading, hiking   Objective: BP 122/72 (BP Location: Left Arm, Patient Position: Sitting, Cuff Size: Large)   Pulse 94   Temp 97.7 F (36.5 C) (Oral)   Ht 5' 2.5" (1.588 m)   Wt 230 lb (104.3 kg)   SpO2 97%   BMI 41.40 kg/m  Gen: NAD, resting comfortably HEENT: Mucous membranes are moist. Oropharynx normal Neck: no thyromegaly CV: RRR no murmurs rubs or gallops Lungs: CTAB no crackles, wheeze, rhonchi Abdomen: soft/nontender/nondistended/normal bowel sounds. No rebound or guarding. Morbid obesity noted Ext: 1+ bilateral nonpitting edema. Equal calf sizes 10 cm below tibial plateau Skin: warm, dry Neuro: grossly normal, moves all extremities, PERRLA  Assessment/Plan:  Other notes: 1.Surgery in December- bladder mesh eroded .   Edema/palpitatoins S: issues with edema for at least 6 months worsening, years of issues- at least 4 . She has tried itermittent hctz (ends up taking daily) as well as triamterene-hctz- neither has seemed to help. Not doing compression stockings yet- plans to order this weekend. Feels like right leg is worse than left. Goes down some in AM but never goes down fully. Has not had labs since this started. No  calf pain. No history blood clots. Feels left leg tends to swell more and gets erythema in it at times and worried about developing cellulitis  In regards to possible thyroid issues- she is cold all the time. Doe shave family history of issues. Has had normal TSH in the past.   Denies shortness of breath. Has some palpitations but not increasing in frequency. No history echocardiogram- she is interested in having more information about her heart in light of palpitations.  A/P: for edema- will evalute with cbc, cmp, tsh.  Given  she also has palpitations, will get echocardiogram. Strongly suspect this is venous insufficiency and she is going to use compression stockings. She would like a boost of diuresis so we discussed possibly  lasix 52m for #20-30 pills. Skip hctz on days used and use for 5-7 days at a time.   Morbid obesity ([place] S: has worked with Dr. WJuleen China was doing phentermine and was helpful but stopped exercising so stopped the medication. Got down to 224 here- back up to 230 A/P: plans to get back on her walking and may consider restarting phentermine  Fibromyalgia S: Gabapentin 3092m2-3x a day. With flares- Tramadol usually at bedtime when used. Sparing aleve.  Has seen Dr. BeAmil Amenears ago- multiple medications tried. Went to Dr. TrCharlestine Nightnd was helpful- was on gabapentin and made pain more manageable. Prednisone with bad flare ups of joint pain. Went back to Dr. HaTrudie Reedfter Truslow retired and was essentially told didn't think fibromyalgia existed and no further workup/management. ESR tends to run around 40 chronically. Has had high sensitivity CRP above cardiac ranges.  A/P: continue gabapentin along with sparing tramadol . I told her I would be willing to call in prednisone for future flares.   Bradycardia S: Tends to run low in 50s. Has seen Dr. JoMartiniqueack in 2013. Wore heart monitor which was low risk- had PVCs and PACs and gets some palpitations. Years ago had SOB with  bradycardia- now no SOB in 2019   A/P: not bradycardic today but will continue to monitor.   Vaginal dryness Finds Estrace vaginal cream twice a week very helpful- I told her I was willing to refill this  She is going to schedule a physical in coming months  Lab/Order associations: Edema, unspecified type - Plan: CBC, Comprehensive metabolic panel, TSH  Fibromyalgia  Palpitations - Plan: CBC, Comprehensive metabolic panel, TSH, ECHOCARDIOGRAM COMPLETE  Morbid obesity ([place] Bradycardia  Vaginal dryness  History of total hysterectomy  Insomnia, unspecified type  History of colon polyps  Screening for HIV (human immunodeficiency virus) - Plan: HIV Antibody (routine testing w rflx)  Return precautions advised.  StGarret ReddishMD

## 2018-04-12 NOTE — Assessment & Plan Note (Signed)
S: has worked with Dr. Juleen China- was doing phentermine and was helpful but stopped exercising so stopped the medication. Got down to 224 here- back up to 230 A/P: plans to get back on her walking and may consider restarting phentermine

## 2018-04-12 NOTE — Assessment & Plan Note (Signed)
Finds Estrace vaginal cream twice a week very helpful- I told her I was willing to refill this

## 2018-04-12 NOTE — Assessment & Plan Note (Signed)
S: Tends to run low in 50s. Has seen Dr. Martinique back in 2013. Wore heart monitor which was low risk- had PVCs and PACs and gets some palpitations. Years ago had SOB with bradycardia- now no SOB in 2019   A/P: not bradycardic today but will continue to monitor.

## 2018-04-12 NOTE — Assessment & Plan Note (Addendum)
S: Gabapentin '300mg'$  2-3x a day. With flares- Tramadol usually at bedtime when used. Sparing aleve.  Has seen Dr. Amil Amen years ago- multiple medications tried. Went to Dr. Charlestine Night and was helpful- was on gabapentin and made pain more manageable. Prednisone with bad flare ups of joint pain. Went back to Dr. Trudie Reed after Truslow retired and was essentially told didn't think fibromyalgia existed and no further workup/management. ESR tends to run around 40 chronically. Has had high sensitivity CRP above cardiac ranges.  A/P: continue gabapentin along with sparing tramadol . I told her I would be willing to call in prednisone for future flares.

## 2018-04-13 ENCOUNTER — Telehealth: Payer: Self-pay | Admitting: Family Medicine

## 2018-04-13 ENCOUNTER — Other Ambulatory Visit: Payer: Self-pay | Admitting: Family Medicine

## 2018-04-13 DIAGNOSIS — Z1231 Encounter for screening mammogram for malignant neoplasm of breast: Secondary | ICD-10-CM

## 2018-04-13 LAB — COMPREHENSIVE METABOLIC PANEL
ALK PHOS: 78 U/L (ref 39–117)
ALT: 19 U/L (ref 0–35)
AST: 24 U/L (ref 0–37)
Albumin: 4 g/dL (ref 3.5–5.2)
BILIRUBIN TOTAL: 0.3 mg/dL (ref 0.2–1.2)
BUN: 20 mg/dL (ref 6–23)
CO2: 31 mEq/L (ref 19–32)
Calcium: 9.7 mg/dL (ref 8.4–10.5)
Chloride: 94 mEq/L — ABNORMAL LOW (ref 96–112)
Creatinine, Ser: 0.73 mg/dL (ref 0.40–1.20)
GFR: 88.09 mL/min (ref >60.00)
GLUCOSE: 85 mg/dL (ref 70–99)
POTASSIUM: 4.1 meq/L (ref 3.5–5.1)
Sodium: 136 mEq/L (ref 135–145)
TOTAL PROTEIN: 7.7 g/dL (ref 6.0–8.3)

## 2018-04-13 LAB — TSH: TSH: 1.97 u[IU]/mL (ref 0.35–4.50)

## 2018-04-13 LAB — CBC
HEMATOCRIT: 38.2 % (ref 36.0–46.0)
HEMOGLOBIN: 12.4 g/dL (ref 12.0–15.0)
MCHC: 32.4 g/dL (ref 30.0–36.0)
MCV: 83.5 fl (ref 78.0–100.0)
Platelets: 203 10*3/uL (ref 150.0–400.0)
RBC: 4.57 Mil/uL (ref 3.87–5.11)
RDW: 13.8 % (ref 11.5–15.5)
WBC: 8.6 10*3/uL (ref 4.0–10.5)

## 2018-04-13 LAB — HIV ANTIBODY (ROUTINE TESTING W REFLEX): HIV: NONREACTIVE

## 2018-04-13 NOTE — Telephone Encounter (Signed)
Nancy [name] at [facility] states no authorization needed for Echocardiogram

## 2018-04-19 ENCOUNTER — Other Ambulatory Visit: Payer: Self-pay

## 2018-04-19 ENCOUNTER — Ambulatory Visit (HOSPITAL_COMMUNITY): Payer: 59 | Attending: Cardiology

## 2018-04-19 ENCOUNTER — Other Ambulatory Visit (HOSPITAL_COMMUNITY): Payer: Self-pay | Admitting: Family Medicine

## 2018-04-19 ENCOUNTER — Other Ambulatory Visit: Payer: Self-pay | Admitting: Family Medicine

## 2018-04-19 DIAGNOSIS — R0602 Shortness of breath: Secondary | ICD-10-CM | POA: Diagnosis not present

## 2018-04-19 DIAGNOSIS — R002 Palpitations: Secondary | ICD-10-CM | POA: Diagnosis not present

## 2018-04-19 DIAGNOSIS — I340 Nonrheumatic mitral (valve) insufficiency: Secondary | ICD-10-CM | POA: Insufficient documentation

## 2018-04-25 ENCOUNTER — Telehealth: Payer: Self-pay | Admitting: Family Medicine

## 2018-04-25 MED ORDER — FUROSEMIDE 20 MG PO TABS: 20.0000 mg | ORAL_TABLET | Freq: Every day | ORAL | 1 refills | Status: DC | PRN

## 2018-04-25 MED FILL — FUROSEMIDE 20 MG TABS: 20 | 30 days supply | Qty: 30 | Fill #0

## 2018-04-25 NOTE — Telephone Encounter (Signed)
Patient requests trial of lasix prn for lasix. Agreed to 3 days at a time prn. Should do a banana a day on those days- we could check potassium in future if using more than very sparingly.   Garret Reddish

## 2018-04-26 DIAGNOSIS — N3941 Urge incontinence: Secondary | ICD-10-CM | POA: Diagnosis not present

## 2018-04-26 DIAGNOSIS — R350 Frequency of micturition: Secondary | ICD-10-CM | POA: Diagnosis not present

## 2018-05-10 ENCOUNTER — Ambulatory Visit
Admission: RE | Admit: 2018-05-10 | Discharge: 2018-05-10 | Disposition: A | Discharge status: Home / Self Care | Payer: 59 | Source: Ambulatory Visit | Attending: Family Medicine | Admitting: Family Medicine

## 2018-05-10 DIAGNOSIS — Z1231 Encounter for screening mammogram for malignant neoplasm of breast: Secondary | ICD-10-CM

## 2018-05-16 ENCOUNTER — Ambulatory Visit: Payer: 59 | Admitting: Sports Medicine

## 2018-05-16 ENCOUNTER — Encounter: Payer: Self-pay | Admitting: Sports Medicine

## 2018-05-16 ENCOUNTER — Ambulatory Visit: Payer: Self-pay

## 2018-05-16 VITALS — BP 112/84 | HR 60 | Ht 62.5 in | Wt 228.0 lb

## 2018-05-16 DIAGNOSIS — M170 Bilateral primary osteoarthritis of knee: Secondary | ICD-10-CM

## 2018-05-16 NOTE — Assessment & Plan Note (Signed)
  Bilateral Knee Injections - Zilretta - Today

## 2018-05-16 NOTE — Patient Instructions (Signed)

## 2018-05-16 NOTE — Progress Notes (Signed)
Nancy [name]. [place], [place] at [place]  Nancy [name] - 52 y.o. female MRN [id number]  Date of birth: March 13, 1966  Visit Date: 05/16/2018  PCP: Marin Olp, MD   Referred by: Marin Olp, MD  Scribe for today's visit: [name], LAT, ATC     SUBJECTIVE:  Nancy [name] is here for Follow-up (B knee pain) .   HPI 12/28/2017: Her B knee pain symptoms INITIALLY: Began about 4 years ago w/ no specific MOI.  She notes that she was working out at [employer] and started having problems w/ her R knee.  She went to see her rheumatologist and received a R knee injection.  She notes that she was informed that she has a R knee meniscal tear.  She has most recently had a L knee injection in Dec 2018 w/ Dr. Jonni Sanger.  She reports that her R knee is currently painful anteriorly and the L knee is most painful laterally and posteriorly.  She has been diagnosed w/ fibromyalgia approximately 4 years prior. Described as mild, sharp pain in her R knee and aching and throbbing in her L knee, nonradiating Worsened with weight bearing activity Improved with pain medication - has taken Aleve and Tramadol; ice and rest Additional associated symptoms include: reports popping/clicking in B knees; B knee swelling; no N/T reported in B LEs    At this time symptoms are worsening compared to onset due to increased intensity and frequency of pain. She has been taking medications prn, ices prn.  She has had 2 knee injections in the past and feels like they helped.  05/16/2018: Compared to the last office visit, her previously described symptoms were improving initially but have started to flare up over the past 3 weeks or so. She reports that L knee did almost give out on her the other day. Posterior pain in L knee.  Current symptoms are mild & are nonradiating. Pain after doing a lot of walking is more moderate.  She took Tramadol the other  night d/t throbbing pain while lying in bed. She will ice her knees after walking on treadmill.   She received B steroid joint inj 12/28/2017 and tolerated well.  ROS Reports night time disturbances. Reports fevers, chills, or night sweats.  Yes to night sweats. Denies unexplained weight loss. Denies personal history of cancer. Denies changes in bowel or bladder habits. Denies recent unreported falls. Denies new or worsening dyspnea or wheezing. Denies headaches or dizziness.  Denies numbness, tingling or weakness  In the extremities.  Denies dizziness or presyncopal episodes Reports lower extremity edema - in B knees.   HISTORY & PERTINENT PRIOR DATA:  Prior History reviewed and updated per electronic medical record.  Significant/pertinent history, findings, studies include:  reports that she has never smoked. She has never used smokeless tobacco. No results for input(s): HGBA1C, LABURIC, CREATINE in the last 8760 hours. No specialty comments available. Problem  Primary Osteoarthritis of Both Knees   Bilateral knee injections 2019- prior dec 2018- Dr. Charlestine Night had done around 2016. None prior to that. Gets reasonable relief after injections. Also has bakers cyst on left.   Bilateral Knee Injections - Zilretta - 05/16/18     OBJECTIVE:  VS:  HT:5' 2.5" (158.8 cm)   WT:228 lb (103.4 kg)  BMI:41.01    BP:112/84  HR:60bpm  TEMP: ( )  RESP:97 %   PHYSICAL EXAM: Constitutional: WDWN, Non-toxic appearing. Psychiatric: Alert &  appropriately interactive.  Not depressed or anxious appearing. Respiratory: No increased work of breathing.  Trachea Midline Eyes: Pupils are equal.  EOM intact without nystagmus.  No scleral icterus  Vascular Exam: warm to touch no edema  lower extremity neuro exam: unremarkable normal strength normal sensation  MSK Exam: Bilateral knees with overall good alignment.  She has 3 to 4 mm of opening with varus testing on the right compared to the  left.  Flexor and extensor range of motion is normal.  Extensor mechanism strength is intact although poor VMO definition bilaterally.  Large soft tissue envelope.    ASSESSMENT & PLAN:   1. Primary osteoarthritis of both knees     PLAN:  Bilateral Zilretta Injections today.    Follow-up: Return if symptoms worsen or fail to improve.      CMA/ATC served as Education administrator during this visit. History, Physical, and Plan performed by medical provider. Documentation and orders reviewed and attested to.      Gerda Diss, [facility] Physician

## 2018-05-16 NOTE — Procedures (Signed)
PROCEDURE NOTE:  Ultrasound Guided: Injection: Bilateral knee Images were obtained and interpreted by myself, Teresa Coombs, DO  Images have been saved and stored to PACS system. Images obtained on: GE S7 Ultrasound machine    ULTRASOUND FINDINGS:  Slight effusion bilateral Degenerative changes bilaterally  DESCRIPTION OF PROCEDURE:  The patient's clinical condition is marked by substantial pain and/or significant functional disability. Other conservative therapy has not provided relief, is contraindicated, or not appropriate. There is a reasonable likelihood that injection will significantly improve the patient's pain and/or functional impairment.   After discussing the risks, benefits and expected outcomes of the injection and all questions were reviewed and answered, the patient wished to undergo the above named procedure.  Verbal consent was obtained.  The ultrasound was used to identify the target structure and adjacent neurovascular structures. The skin was then prepped in sterile fashion and the target structure was injected under direct visualization using sterile technique as below:  Bilateral injections performed under identical technique as below: PREP: Alcohol and Ethel Chloride APPROACH:superiolateral, single injection, 21g 2 in. INJECTATE: 5cc Zilretta (32mg /82mL Sustained Release Triamcinolone) ASPIRATE: None DRESSING: Band-Aid  Post procedural instructions including recommending icing and warning signs for infection were reviewed.    This procedure was well tolerated and there were no complications.   IMPRESSION: Succesful Ultrasound Guided: Injection

## 2018-05-17 ENCOUNTER — Other Ambulatory Visit: Payer: Self-pay | Admitting: Urology

## 2018-05-17 ENCOUNTER — Encounter: Payer: Self-pay | Admitting: Sports Medicine

## 2018-05-21 MED FILL — FUROSEMIDE 20 MG TABS: 20 | 30 days supply | Qty: 30 | Fill #0

## 2018-07-24 ENCOUNTER — Other Ambulatory Visit: Payer: Self-pay | Admitting: Physician Assistant

## 2018-07-24 MED ORDER — ALBUTEROL SULFATE 108 (90 BASE) MCG/ACT IN AEPB: 1.0000 | INHALATION_SPRAY | Freq: Four times a day (QID) | RESPIRATORY_TRACT | 3 refills | Status: DC | PRN

## 2018-07-24 MED FILL — PROAIR RESPICLICK INHAL PWD: 108 (90 BAS | 30 days supply | Qty: 1 | Fill #0

## 2018-07-24 MED FILL — HYDROCHLOROTHIAZIDE 25 MG T: 25 | 90 days supply | Qty: 90 | Fill #1

## 2018-07-27 NOTE — H&P (Signed)
I was consulted by the above provided to assist the patient's possible mesh erosion   She describes a SPARC sling in 2013 in [place]. Subsequently she had mesh erosion trimmed in the operating room during and oophorectomy. She had symptoms relative prior to this procedure refractory to Premarin cream. She currently has pain with intercourse and her husband has scratching associated. She has no vaginal bleeding or discharge. Otherwise she does not have vaginal pain   She denies stress incontinence but has urge incontinence. She denies bedwetting but does not wear pads. She does not take medication for urgency incontinence   She is an LPN and voids every 3 hours and gets up once a night and voids with a good flow   The sling was done in [place]. Dr. Gaetano Net performing the oophorectomy May 31, 2013. He describes removing a 3 mm pieces sling to the right side of the midline and I did not note any mobilization of tissue.   Prior to surgery she describes mixed stress and urge incontinence. She thought the stress component was worse but they both got better after the sling. The medical record demonstrated dryness postoperatively. She was wearing approximate 4 pads a day to be quite wet.   On pelvic examination she had a grade 1 cystocele. She had mild grade 2 hypermobility of the bladder neck and a significant positive cough test after cystoscopy. In spite of this she denies stress incontinence. I felt I could feel and feel mesh at the urethrovesical angle just to the right of the midline associated with some mucosal folds little bit of redness. One could argue she was a little bit tender in that area. I could feel sling near the right pelvic sidewall at the interface and a little bit in the same position on the left but could not feel or see more extrusion. The findings were similar to what one sees with a trans-obturator sling   The patient has pain with intercourse and symptoms from probable  vaginal mesh erosion. She is having recurrent bladder infections. She has urgency incontinence. She has mild nighttime frequency. Her postvoid residual urine on today was mildly elevated and may represent a true reading at 164 mL   The patient understands that arguably she did have a low-grade infection of the exposed component once again supporting removal of the vaginal component of the sling. We will get a renal ultrasound and discuss urinary tract infections as well as recheck the residual.   A picture was drawn. The role of urodynamics based upon the history and physical examination were recommended. Based upon her history prior to surgery I would not be surprised if she has to sling removed she will unmask mixed incontinence. Based upon the physical findings I think it would be best to remove the majority of the vaginal sling as opposed to what was visualized locally.   Today  Frequency and incontinence are stable. She does not wear pads but she says the urgency is worse now since surgery. She canceled the urodynamics since she is in a high surgery next year. Unfortunately the trouble with her husband we will not feel the fix at this stage   Residual was 0 mL on the ultrasound   I gave her Vesicare 5 mg samples and prescription and myrbetriq 25 mg samples and prescription and behavioral therapy. She will call next year with 2 months lead time at least for urodynamics. She works currently as a Leisure centre manager  Patient has  not been assessed for 1 year  On urodynamics the patient had a uroflow noted with a depressed pattern and emptied efficiently. Maximum bladder capacity was 389 mL with mild sensory urgency. Bladder was unstable reaching pressures of 7 cm of water associated with urgency until she had to have permission to void. She did not have stress incontinence with Valsalva pressure of 82 cm of water. During voluntary voiding without urgency the patient voided 183 mL with a maximal flow 11  mL/second. Maximum voiding pressure of 7 cm of water. Patient emptied efficiently. EMG activity was increased during the voiding phase. Bladder neck descended 1 or 2 cm. The details of the urodynamics are sign and dictated   Patient never tried to medications. She is sexually active. She has pain with intercourse and it is uncomfortable and scratching to her husband. The pain she finds is quite deep in the vagina. She uses lubricant and sometimes local estrogen for dryness that his perceived. She currently has urgency but does not wear a pad. Her incontinence or voiding dysfunction has not changed   On physical examination I could definitely see the mesh approximately a cm in length by 3 or 4 mm at the right urethrovesical angle. Based on body habitus and tenderness I did not over examine and I could feel mesh likely at the junction of each side wall in keeping with a trans obturator tape but not exposed.   Picture drawn. In my opinion the patient is at high risk of unmasking mixed stress urge incontinence. This would be initially managed with medical therapy and behavioral therapy. She should not have a repeat sling simultaneously recognizing that dyspareunia and scratching are the symptoms were trying to treat. I would make an intraoperative decision on how much mesh to remove because of the pain aspect. She understands the pain in many cases can worsen or persist after surgery with up regulation. Of course if successful that should not be any more scratching to her husband. She currently does not complain of discharge or vaginal bleeding. I would thoroughly inspect the entire vagina. Again injury to other structures with sequelae detail.Failure and re- exposure discussed. She like to have this done between Christmas and new year's. She is a Marine scientist at [employer]. in my opinion this can be done as an outpatient and we can always keep their in overnight if needed     ALLERGIES: Advair Diskus AEPB - Excessive  coughing Erythromycin - Nausea, Diarrhea Percocet - Hallucinations    MEDICATIONS: Hydrochlorothiazide  Albuterol Sulfate  Aleve  Gabapentin  Ibuprofen  Melatonin  Tramadol Hcl  Tylenol  Vitamin D3     GU PSH: Complex cystometrogram, w/ void pressure and urethral pressure profile studies, any technique - 03/15/2018 Complex Uroflow - 03/15/2018 Cystoscopy - 11/02/2016 Emg surf Electrd - 03/15/2018 Inject For cystogram - 03/15/2018 Intrabd voidng Press - 03/15/2018    NON-GU PSH: Cesarean Delivery - 1995 Dental Surgery Procedure - 1986 Tonsillectomy - 1969    GU PMH: Incomplete bladder emptying - 11/02/2016 Urge incontinence - 11/02/2016 Urinary Frequency - 11/02/2016 Urinary Tract Inf, Unspec site - 11/02/2016    NON-GU PMH: Arthritis Asthma Fibromyalgia    FAMILY HISTORY: 1 Daughter - Other 1 son - Other Colon Cancer - Grandfather, Uncle fibromyalgia - Mother Hypertension - Father Lung Cancer - Grandmother Patient's father is still living - Other patient's mother is still living - Other    Notes: Mother is 92  Father is 53   SOCIAL HISTORY: Marital  Status: Married Current Smoking Status: Patient has never smoked.   Tobacco Use Assessment Completed: Used Tobacco in last 30 days? Social Drinker.  Drinks 4+ caffeinated drinks per day. Patient's occupation is/was LPN.    REVIEW OF SYSTEMS:    GU Review Female:   Patient denies frequent urination, hard to postpone urination, burning /pain with urination, get up at night to urinate, leakage of urine, stream starts and stops, trouble starting your stream, have to strain to urinate, and being pregnant.  Gastrointestinal (Upper):   Patient denies nausea, vomiting, and indigestion/ heartburn.  Gastrointestinal (Lower):   Patient denies diarrhea and constipation.  Constitutional:   Patient denies fever, night sweats, weight loss, and fatigue.  Skin:   Patient denies skin rash/ lesion and itching.  Eyes:   Patient denies double  vision and blurred vision.  Ears/ Nose/ Throat:   Patient denies sore throat and sinus problems.  Hematologic/Lymphatic:   Patient denies swollen glands and easy bruising.  Cardiovascular:   Patient denies leg swelling and chest pains.  Respiratory:   Patient denies cough and shortness of breath.  Endocrine:   Patient denies excessive thirst.  Musculoskeletal:   Patient denies back pain and joint pain.  Neurological:   Patient denies headaches and dizziness.  Psychologic:   Patient denies depression and anxiety.   VITAL SIGNS:      04/26/2018 01:40 PM  BP 128/76 mmHg  Pulse 100 /min  Temperature 98.1 F / 36.7 C   PAST DATA REVIEWED:  Source Of History:  Patient   PROCEDURES:          Urinalysis Dipstick Dipstick Cont'd  Color: Yellow Bilirubin: Neg mg/dL  Appearance: Clear Ketones: Neg mg/dL  Specific Gravity: 1.020 Blood: Neg ery/uL  pH: <=5.0 Protein: Neg mg/dL  Glucose: Neg mg/dL Urobilinogen: 0.2 mg/dL    Nitrites: Neg    Leukocyte Esterase: Neg leu/uL    ASSESSMENT:      ICD-10 Details  1 GU:   Urinary Frequency - R35.0   2   Urge incontinence - N39.41               Notes:   I drew her a picture and we talked about reconstructive surgery in detail. Pros, cons, general surgical and anesthetic risks, and other options including watchful waiting were discussed. She understands that surgery is successful in approximately 85% of cases. Failure rates and the risk of persistence/recurrence/and worsening of the problem were discussed. Surgical risks were described but not limited to the discussion of injury to neighboring structures including the bowel (with possible life-threatening sepsis and colostomy), bladder, urethra, vagina (all resulting in further surgery), and ureter (resulting in re-implantation). We talked about injury to nerves/soft tissue leading to debilitating and intractable pelvic, abdominal, and lower extremity pain syndromes and neuropathies. The risks of  dyspareunia, vaginal narrowing and shortening with sequelae were discussed. The risk of persistent, de novo, or worsening incontinence/dysfunction was discussed. Bleeding risks, transfusion rates, and infection were discussed. The usual post-operative course was described. The patient understands that she might not reach her treatment goal and that she might be worse following surgery.   After a thorough review of the management options for the patient's condition the patient  elected to proceed with surgical therapy as noted above. We have discussed the potential benefits and risks of the procedure, side effects of the proposed treatment, the likelihood of the patient achieving the goals of the procedure, and any potential problems that might occur during the  procedure or recuperation. Informed consent has been obtained.

## 2018-07-30 ENCOUNTER — Other Ambulatory Visit: Payer: Self-pay

## 2018-07-30 ENCOUNTER — Encounter (HOSPITAL_COMMUNITY): Payer: Self-pay | Admitting: *Deleted

## 2018-07-31 ENCOUNTER — Ambulatory Visit (HOSPITAL_COMMUNITY): Payer: 59 | Admitting: Anesthesiology

## 2018-07-31 ENCOUNTER — Other Ambulatory Visit: Payer: Self-pay | Admitting: Family Medicine

## 2018-07-31 ENCOUNTER — Encounter (HOSPITAL_COMMUNITY)
Admission: RE | Disposition: A | Discharge status: Home / Self Care | Payer: Self-pay | Source: Ambulatory Visit | Attending: Urology

## 2018-07-31 ENCOUNTER — Ambulatory Visit (HOSPITAL_COMMUNITY)
Admission: RE | Admit: 2018-07-31 | Discharge: 2018-07-31 | Disposition: A | Discharge status: Home / Self Care | Payer: 59 | Source: Ambulatory Visit | Attending: Urology | Admitting: Urology

## 2018-07-31 ENCOUNTER — Encounter (HOSPITAL_COMMUNITY): Payer: Self-pay

## 2018-07-31 DIAGNOSIS — Z8249 Family history of ischemic heart disease and other diseases of the circulatory system: Secondary | ICD-10-CM | POA: Insufficient documentation

## 2018-07-31 DIAGNOSIS — Z881 Allergy status to other antibiotic agents status: Secondary | ICD-10-CM | POA: Insufficient documentation

## 2018-07-31 DIAGNOSIS — Z79899 Other long term (current) drug therapy: Secondary | ICD-10-CM | POA: Diagnosis not present

## 2018-07-31 DIAGNOSIS — Z6841 Body Mass Index (BMI) 40.0 and over, adult: Secondary | ICD-10-CM | POA: Insufficient documentation

## 2018-07-31 DIAGNOSIS — Z800 Family history of malignant neoplasm of digestive organs: Secondary | ICD-10-CM | POA: Diagnosis not present

## 2018-07-31 DIAGNOSIS — M797 Fibromyalgia: Secondary | ICD-10-CM | POA: Diagnosis not present

## 2018-07-31 DIAGNOSIS — Z8269 Family history of other diseases of the musculoskeletal system and connective tissue: Secondary | ICD-10-CM | POA: Insufficient documentation

## 2018-07-31 DIAGNOSIS — T83721A Exposure of implanted vaginal mesh and other prosthetic materials into vagina, initial encounter: Secondary | ICD-10-CM | POA: Insufficient documentation

## 2018-07-31 DIAGNOSIS — X58XXXA Exposure to other specified factors, initial encounter: Secondary | ICD-10-CM | POA: Insufficient documentation

## 2018-07-31 DIAGNOSIS — Z791 Long term (current) use of non-steroidal anti-inflammatories (NSAID): Secondary | ICD-10-CM | POA: Insufficient documentation

## 2018-07-31 DIAGNOSIS — J45909 Unspecified asthma, uncomplicated: Secondary | ICD-10-CM | POA: Insufficient documentation

## 2018-07-31 DIAGNOSIS — N8110 Cystocele, unspecified: Secondary | ICD-10-CM | POA: Diagnosis not present

## 2018-07-31 DIAGNOSIS — Z801 Family history of malignant neoplasm of trachea, bronchus and lung: Secondary | ICD-10-CM | POA: Diagnosis not present

## 2018-07-31 DIAGNOSIS — T83711A Erosion of implanted vaginal mesh and other prosthetic materials to surrounding organ or tissue, initial encounter: Secondary | ICD-10-CM | POA: Diagnosis not present

## 2018-07-31 DIAGNOSIS — Z885 Allergy status to narcotic agent status: Secondary | ICD-10-CM | POA: Diagnosis not present

## 2018-07-31 DIAGNOSIS — N3946 Mixed incontinence: Secondary | ICD-10-CM | POA: Insufficient documentation

## 2018-07-31 HISTORY — PX: EXCISION OF MESH: SHX6268

## 2018-07-31 HISTORY — DX: Fibromyalgia: M79.7

## 2018-07-31 HISTORY — PX: CYSTOSCOPY: SHX5120

## 2018-07-31 LAB — BASIC METABOLIC PANEL
ANION GAP: 8 (ref 5–15)
BUN: 24 mg/dL — ABNORMAL HIGH (ref 6–20)
CO2: 29 mmol/L (ref 22–32)
Calcium: 8.8 mg/dL — ABNORMAL LOW (ref 8.9–10.3)
Chloride: 102 mmol/L (ref 98–111)
Creatinine, Ser: 0.82 mg/dL (ref 0.44–1.00)
GFR calc Af Amer: >60 mL/min (ref >60)
Glucose, Bld: 102 mg/dL — ABNORMAL HIGH (ref 70–99)
Potassium: 4 mmol/L (ref 3.5–5.1)
Sodium: 139 mmol/L (ref 135–145)

## 2018-07-31 LAB — CBC
HCT: 41.6 % (ref 36.0–46.0)
Hemoglobin: 12.6 g/dL (ref 12.0–15.0)
MCH: 27.1 pg (ref 26.0–34.0)
MCHC: 30.3 g/dL (ref 30.0–36.0)
MCV: 89.5 fL (ref 80.0–100.0)
NRBC: 0 % (ref 0.0–0.2)
Platelets: 213 10*3/uL (ref 150–400)
RBC: 4.65 MIL/uL (ref 3.87–5.11)
RDW: 13.5 % (ref 11.5–15.5)
WBC: 8.1 10*3/uL (ref 4.0–10.5)

## 2018-07-31 SURGERY — REMOVAL, MESH, ABDOMEN OR PELVIS
Anesthesia: General

## 2018-07-31 MED ORDER — CLINDAMYCIN PHOSPHATE 2 % VA CREA
TOPICAL_CREAM | VAGINAL | Status: AC
  Filled 2018-07-31: qty 40

## 2018-07-31 MED ORDER — ROCURONIUM BROMIDE 10 MG/ML (PF) SYRINGE
PREFILLED_SYRINGE | INTRAVENOUS | Status: AC
  Filled 2018-07-31: qty 10

## 2018-07-31 MED ORDER — FENTANYL CITRATE (PF) 100 MCG/2ML IJ SOLN
INTRAMUSCULAR | Status: AC
  Filled 2018-07-31: qty 2

## 2018-07-31 MED ORDER — OSELTAMIVIR PHOSPHATE 75 MG PO CAPS: 75.0000 mg | ORAL_CAPSULE | Freq: Every day | ORAL | 0 refills | Status: DC

## 2018-07-31 MED ORDER — ONDANSETRON HCL 4 MG/2ML IJ SOLN
INTRAMUSCULAR | Status: DC | PRN
  Administered 2018-07-31: 4 mg via INTRAVENOUS

## 2018-07-31 MED ORDER — DEXAMETHASONE SODIUM PHOSPHATE 10 MG/ML IJ SOLN
INTRAMUSCULAR | Status: DC | PRN
  Administered 2018-07-31: 10 mg via INTRAVENOUS

## 2018-07-31 MED ORDER — MIDAZOLAM HCL 5 MG/5ML IJ SOLN
INTRAMUSCULAR | Status: DC | PRN
  Administered 2018-07-31: 2 mg via INTRAVENOUS

## 2018-07-31 MED ORDER — TRAMADOL HCL 50 MG PO TABS: ORAL_TABLET | ORAL | 0 refills | Status: DC

## 2018-07-31 MED ORDER — LIDOCAINE-EPINEPHRINE (PF) 1 %-1:200000 IJ SOLN
INTRAMUSCULAR | Status: AC
  Filled 2018-07-31: qty 30

## 2018-07-31 MED ORDER — STERILE WATER FOR IRRIGATION IR SOLN
Status: DC | PRN
  Administered 2018-07-31: 2000 mL

## 2018-07-31 MED ORDER — PROPOFOL 10 MG/ML IV BOLUS
INTRAVENOUS | Status: AC
  Filled 2018-07-31: qty 20

## 2018-07-31 MED ORDER — PROPOFOL 10 MG/ML IV BOLUS
INTRAVENOUS | Status: DC | PRN
  Administered 2018-07-31: 120 mg via INTRAVENOUS

## 2018-07-31 MED ORDER — ONDANSETRON HCL 4 MG/2ML IJ SOLN
INTRAMUSCULAR | Status: AC
  Filled 2018-07-31: qty 2

## 2018-07-31 MED ORDER — CLINDAMYCIN PHOSPHATE 600 MG/50ML IV SOLN
600.0000 mg | INTRAVENOUS | Status: AC
  Administered 2018-07-31: 600 mg via INTRAVENOUS
  Filled 2018-07-31: qty 50

## 2018-07-31 MED ORDER — FENTANYL CITRATE (PF) 100 MCG/2ML IJ SOLN: 25.0000 ug | INTRAMUSCULAR | Status: DC | PRN

## 2018-07-31 MED ORDER — CLINDAMYCIN PHOSPHATE 2 % VA CREA
TOPICAL_CREAM | VAGINAL | Status: DC | PRN
  Administered 2018-07-31: 1 via VAGINAL

## 2018-07-31 MED ORDER — FENTANYL CITRATE (PF) 100 MCG/2ML IJ SOLN
INTRAMUSCULAR | Status: DC | PRN
  Administered 2018-07-31 (×5): 25 ug via INTRAVENOUS
  Administered 2018-07-31: 50 ug via INTRAVENOUS
  Administered 2018-07-31: 25 ug via INTRAVENOUS

## 2018-07-31 MED ORDER — 0.9 % SODIUM CHLORIDE (POUR BTL) OPTIME
TOPICAL | Status: DC | PRN
  Administered 2018-07-31: 1000 mL

## 2018-07-31 MED ORDER — CEFAZOLIN SODIUM-DEXTROSE 2-4 GM/100ML-% IV SOLN
2.0000 g | INTRAVENOUS | Status: AC
  Administered 2018-07-31: 2 g via INTRAVENOUS
  Filled 2018-07-31: qty 100

## 2018-07-31 MED ORDER — ONDANSETRON HCL 4 MG/2ML IJ SOLN: 4.0000 mg | Freq: Four times a day (QID) | INTRAMUSCULAR | Status: DC | PRN

## 2018-07-31 MED ORDER — DEXAMETHASONE SODIUM PHOSPHATE 10 MG/ML IJ SOLN
INTRAMUSCULAR | Status: AC
  Filled 2018-07-31: qty 1

## 2018-07-31 MED ORDER — SCOPOLAMINE 1 MG/3DAYS TD PT72
MEDICATED_PATCH | TRANSDERMAL | Status: AC
  Filled 2018-07-31: qty 1

## 2018-07-31 MED ORDER — LIDOCAINE 2% (20 MG/ML) 5 ML SYRINGE
INTRAMUSCULAR | Status: AC
  Filled 2018-07-31: qty 5

## 2018-07-31 MED ORDER — LACTATED RINGERS IV SOLN
INTRAVENOUS | Status: DC
  Administered 2018-07-31: 1000 mL via INTRAVENOUS
  Administered 2018-07-31: 09:00:00 via INTRAVENOUS

## 2018-07-31 MED ORDER — MIDAZOLAM HCL 2 MG/2ML IJ SOLN
INTRAMUSCULAR | Status: AC
  Filled 2018-07-31: qty 2

## 2018-07-31 MED ORDER — LIDOCAINE 2% (20 MG/ML) 5 ML SYRINGE
INTRAMUSCULAR | Status: DC | PRN
  Administered 2018-07-31: 100 mg via INTRAVENOUS

## 2018-07-31 MED ORDER — SCOPOLAMINE 1 MG/3DAYS TD PT72
MEDICATED_PATCH | TRANSDERMAL | Status: DC | PRN
  Administered 2018-07-31: 1 via TRANSDERMAL

## 2018-07-31 SURGICAL SUPPLY — 41 items
BAG URINE DRAINAGE (UROLOGICAL SUPPLIES) ×2 IMPLANT
BAG URO CATCHER STRL LF (MISCELLANEOUS) IMPLANT
BLADE 10 SAFETY STRL DISP (BLADE) ×2 IMPLANT
BLADE 15 SAFETY STRL DISP (BLADE) ×2 IMPLANT
BRIEF STRETCH FOR OB PAD LRG (UNDERPADS AND DIAPERS) ×2 IMPLANT
CATH FOLEY 2W COUNCIL 5CC 18FR (CATHETERS) IMPLANT
CATH FOLEY 2WAY SLVR  5CC 14FR (CATHETERS) ×1
CATH FOLEY 2WAY SLVR 5CC 14FR (CATHETERS) ×1 IMPLANT
CLOTH BEACON ORANGE TIMEOUT ST (SAFETY) IMPLANT
COVER MAYO STAND STRL (DRAPES) ×2 IMPLANT
COVER WAND RF STERILE (DRAPES) IMPLANT
DRAIN PENROSE 18X1/2 LTX STRL (DRAIN) ×2 IMPLANT
ELECT PENCIL ROCKER SW 15FT (MISCELLANEOUS) ×2 IMPLANT
ELECT REM PT RETURN 9FT ADLT (ELECTROSURGICAL) ×4
ELECTRODE REM PT RTRN 9FT ADLT (ELECTROSURGICAL) ×2 IMPLANT
GAUZE 4X4 16PLY RFD (DISPOSABLE) ×4 IMPLANT
GAUZE PACKING 1 X5 YD ST (GAUZE/BANDAGES/DRESSINGS) ×2 IMPLANT
GLOVE BIO SURGEON STRL SZ 6.5 (GLOVE) ×2 IMPLANT
GLOVE BIOGEL M STRL SZ7.5 (GLOVE) ×4 IMPLANT
GOWN STRL REUS W/TWL LRG LVL3 (GOWN DISPOSABLE) ×2 IMPLANT
GOWN STRL REUS W/TWL XL LVL3 (GOWN DISPOSABLE) ×2 IMPLANT
HEMOSTAT SURGICEL 2X14 (HEMOSTASIS) ×2 IMPLANT
KIT BASIN OR (CUSTOM PROCEDURE TRAY) ×2 IMPLANT
NEEDLE HYPO 22GX1.5 SAFETY (NEEDLE) IMPLANT
PACK CYSTO (CUSTOM PROCEDURE TRAY) ×2 IMPLANT
PLUG CATH AND CAP STER (CATHETERS) ×2 IMPLANT
PROTECTOR NERVE ULNAR (MISCELLANEOUS) ×2 IMPLANT
RETRACTOR STAY HOOK 5MM (MISCELLANEOUS) ×2 IMPLANT
SHEET LAVH (DRAPES) ×2 IMPLANT
SUT VIC AB 0 CT1 36 (SUTURE) ×2 IMPLANT
SUT VIC AB 2-0 SH 27 (SUTURE) ×7
SUT VIC AB 2-0 SH 27XBRD (SUTURE) ×7 IMPLANT
SUT VIC AB 3-0 SH 27 (SUTURE) ×1
SUT VIC AB 3-0 SH 27X BRD (SUTURE) ×1 IMPLANT
SYR CONTROL 10ML LL (SYRINGE) IMPLANT
TOWEL OR 17X26 10 PK STRL BLUE (TOWEL DISPOSABLE) ×2 IMPLANT
TOWEL OR NON WOVEN STRL DISP B (DISPOSABLE) ×2 IMPLANT
TUBING CONNECTING 10 (TUBING) ×2 IMPLANT
UNDERPAD 30X30 (UNDERPADS AND DIAPERS) ×2 IMPLANT
WATER STERILE IRR 250ML POUR (IV SOLUTION) ×2 IMPLANT
YANKAUER SUCT BULB TIP 10FT TU (MISCELLANEOUS) ×2 IMPLANT

## 2018-07-31 NOTE — Transfer of Care (Signed)
Immediate Anesthesia Transfer of Care Note  Patient: Nancy [name]  Procedure(s) Performed: REMOVAL OF VAGINAL MESH (N/A ) CYSTOSCOPY (N/A )  Patient Location: PACU  Anesthesia Type:General  Level of Consciousness: sedated  Airway & Oxygen Therapy: Patient Spontanous Breathing and Patient connected to face mask oxygen  Post-op Assessment: Report given to RN and Post -op Vital signs reviewed and stable  Post vital signs: Reviewed and stable  Last Vitals:  Vitals Value Taken Time  BP    Temp    Pulse 53 07/31/2018 10:20 AM  Resp    SpO2 99 % 07/31/2018 10:20 AM  Vitals shown include unvalidated device data.  Last Pain:  Vitals:   07/31/18 0624  TempSrc:   PainSc: 0-No pain      Patients Stated Pain Goal: 4 (80/03/49 1791)  Complications: No apparent anesthesia complications reported need for  Needle exposure panel by lab

## 2018-07-31 NOTE — Anesthesia Procedure Notes (Signed)
Procedure Name: LMA Insertion Date/Time: 07/31/2018 7:35 AM Performed by: Lind Covert, CRNA Pre-anesthesia Checklist: Patient identified, Emergency Drugs available, Suction available, Patient being monitored and Timeout performed Patient Re-evaluated:Patient Re-evaluated prior to induction Oxygen Delivery Method: Circle system utilized Preoxygenation: Pre-oxygenation with 100% oxygen Induction Type: IV induction LMA: LMA inserted LMA Size: 4.0 Number of attempts: 1 Placement Confirmation: positive ETCO2 and breath sounds checked- equal and bilateral Tube secured with: Tape Dental Injury: Teeth and Oropharynx as per pre-operative assessment

## 2018-07-31 NOTE — Anesthesia Preprocedure Evaluation (Signed)
Anesthesia Evaluation  Patient identified by MRN, date of birth, ID band Patient awake    Reviewed: Allergy & Precautions, H&P , NPO status , Patient's Chart, lab work & pertinent test results  History of Anesthesia Complications (+) PONV and history of anesthetic complications  Airway Mallampati: II   Neck ROM: full    Dental   Pulmonary shortness of breath, asthma ,    breath sounds clear to auscultation       Cardiovascular negative cardio ROS   Rhythm:regular Rate:Normal     Neuro/Psych  Headaches,  Neuromuscular disease    GI/Hepatic   Endo/Other  Morbid obesity  Renal/GU      Musculoskeletal  (+) Fibromyalgia -  Abdominal   Peds  Hematology   Anesthesia Other Findings   Reproductive/Obstetrics                             Anesthesia Physical Anesthesia Plan  ASA: II  Anesthesia Plan: General   Post-op Pain Management:    Induction: Intravenous  PONV Risk Score and Plan: 4 or greater and Ondansetron, Dexamethasone, Midazolam, Scopolamine patch - Pre-op and Treatment may vary due to age or medical condition  Airway Management Planned: LMA  Additional Equipment:   Intra-op Plan:   Post-operative Plan:   Informed Consent: I have reviewed the patients History and Physical, chart, labs and discussed the procedure including the risks, benefits and alternatives for the proposed anesthesia with the patient or authorized representative who has indicated his/her understanding and acceptance.     Plan Discussed with: CRNA, Anesthesiologist and Surgeon  Anesthesia Plan Comments:         Anesthesia Quick Evaluation

## 2018-07-31 NOTE — Op Note (Signed)
Preoperative diagnosis: Eroded vaginal mesh Postoperative diagnosis: Eroded vaginal mesh Surgery: Removal of eroded vaginal mesh and cystoscopy Surgeon: Dr. Nicki Reaper Koby Hartfield Assistant: Estill Bamberg dancy  The assistant was present and necessary for all steps of the operation described. The assistant played a critical role assisting during the operation  The patient has the above diagnosis and consented to the above procedure.  Extra care was taken with leg positioning to minimize the risk of compartment syndrome and neuropathy and deep vein thrombosis.  Based upon body habitus I used a curved cerebellar and a double ring retractor with blue hooks for exposure.  She was moderately obese especially in the perineal area and she was quite deep.  Preoperative antibiotics were given.  I could see the eroded mesh approximately 1 cm in length by 4 mm underneath the mid urethra to the right of the midline extending to the right side.  I could not palpate mesh or ridge at the interface of the sidewall in the anterior vaginal wall.  I felt I was seeing the upper edge of the mesh.  Based upon exposure I decided to make the incision what I thought was the upper third of the mesh and almost its upper edge.  This turned out to be correct.  I developed a flap downward exposing the mesh.  I extended the incision to the right pelvic sidewall and almost completely to the left side more in the area of the urethrovesical angle based upon the direction of the sling and not the sidewall.  I developed the flap well.  To the right of the urethra I passed the right angle around the exposed mesh.  I mobilized the intact mesh a little bit further with scissors.  I divided the mesh.  Using a tonsil or DeBakey forceps I could grab each freed end of the mesh.  I traced the mesh with sharp and a little bit of blunt dissection to the right pelvic floor laterally.  I was very careful with sharp dissection to do the same maneuver freeing the  mesh all the way to the pelvic floor on the left side.  I was careful excising the mesh not leaving any exposed mesh in the vagina.  She had bleeding from the endopelvic fascia more so on the right side and little bit on the left.  A little bit of cautery was utilized and Surgicel later on in the surgery.  The entire intravaginal component of mesh was removed visually and palpably  I had cystoscoped the patient prior to the surgery and she had a grade 1 cystocele and normal urethra and bladder.  I cystoscoped again with the same findings.  There is no injury.  She had a fairly thick urethra palpably underneath the rigid scope.  The tissues looked excellent in the area of the bladder neck and urethra.  There was no exposed mesh.  She had an overcorrected urethrovesical angle with a deep vagina making all the dissection more difficult.  It also made it very difficult to close.  I used 2-0 Vicryl running from the left apex to the midline in the right apex to the midline.  I used 6 or 7 interrupted 2-0 Vicryls as well.  I was exceptionally diligent in trying to close the vagina to make certain that there would be no mesh near the sidewalls or near the urethrovesical angle that could erode through.  Because of her anatomy it was difficult and took many minutes.  Surgicel had been placed in  both urethrovesical angle and pelvic sidewall regions.  Blood loss was less than 150 mL.  Urine was clear.  Leg position was good.  Vaginal pack with clindamycin cream applied.  Patient will be followed as per protocol and I was very pleased with the surgery.

## 2018-07-31 NOTE — Interval H&P Note (Signed)
History and Physical Interval Note:  07/31/2018 7:13 AM  Nancy [name]  has presented today for surgery, with the diagnosis of EXPOSED VAGINAL MESH  The various methods of treatment have been discussed with the patient and family. After consideration of risks, benefits and other options for treatment, the patient has consented to  Procedure(s): REMOVAL OF VAGINAL MESH (N/A) CYSTOSCOPY (N/A) as a surgical intervention .  The patient's history has been reviewed, patient examined, no change in status, stable for surgery.  I have reviewed the patient's chart and labs.  Questions were answered to the patient's satisfaction.     Lionardo Haze A Rayola Everhart

## 2018-08-01 NOTE — Anesthesia Postprocedure Evaluation (Signed)
Anesthesia Post Note  Patient: Nancy [name]  Procedure(s) Performed: REMOVAL OF VAGINAL MESH (N/A ) CYSTOSCOPY (N/A )     Patient location during evaluation: PACU Anesthesia Type: General Level of consciousness: awake and alert Pain management: pain level controlled Vital Signs Assessment: post-procedure vital signs reviewed and stable Respiratory status: spontaneous breathing, nonlabored ventilation, respiratory function stable and patient connected to nasal cannula oxygen Cardiovascular status: blood pressure returned to baseline and stable Postop Assessment: no apparent nausea or vomiting Anesthetic complications: no    Last Vitals:  Vitals:   07/31/18 1130 07/31/18 1155  BP: 123/74 130/78  Pulse: (!) 53 (!) 48  Resp: 10 12  Temp: 36.5 C (!) 36.4 C  SpO2: 95% 98%    Last Pain:  Vitals:   07/31/18 1155  TempSrc:   PainSc: 0-No pain                 [name] S

## 2018-08-02 ENCOUNTER — Encounter (HOSPITAL_COMMUNITY): Payer: Self-pay | Admitting: Urology

## 2018-08-02 DIAGNOSIS — R3914 Feeling of incomplete bladder emptying: Secondary | ICD-10-CM | POA: Diagnosis not present

## 2018-08-02 DIAGNOSIS — R350 Frequency of micturition: Secondary | ICD-10-CM | POA: Diagnosis not present

## 2018-08-07 DIAGNOSIS — B951 Streptococcus, group B, as the cause of diseases classified elsewhere: Secondary | ICD-10-CM | POA: Diagnosis not present

## 2018-08-07 DIAGNOSIS — N3941 Urge incontinence: Secondary | ICD-10-CM | POA: Diagnosis not present

## 2018-08-07 DIAGNOSIS — N390 Urinary tract infection, site not specified: Secondary | ICD-10-CM | POA: Diagnosis not present

## 2018-08-07 DIAGNOSIS — R3914 Feeling of incomplete bladder emptying: Secondary | ICD-10-CM | POA: Diagnosis not present

## 2018-08-07 MED FILL — CIPROFLOXACIN HCL 250 MG TA: 250 | 3 days supply | Qty: 6 | Fill #0

## 2018-08-09 MED FILL — CEPHALEXIN 500 MG CAPSULE: 500 | 7 days supply | Qty: 21 | Fill #0

## 2018-08-15 ENCOUNTER — Other Ambulatory Visit: Payer: Self-pay | Admitting: Family Medicine

## 2018-08-15 MED ORDER — PREDNISONE 10 MG PO TABS: ORAL_TABLET | ORAL | 0 refills | Status: DC

## 2018-08-15 MED FILL — predniSONE 10 MG TABS: 10 | 9 days supply | Qty: 24 | Fill #0

## 2018-08-16 ENCOUNTER — Encounter: Payer: Self-pay | Admitting: Obstetrics and Gynecology

## 2018-08-24 ENCOUNTER — Other Ambulatory Visit: Payer: Self-pay

## 2018-08-24 ENCOUNTER — Other Ambulatory Visit (INDEPENDENT_AMBULATORY_CARE_PROVIDER_SITE_OTHER): Payer: 59

## 2018-08-24 DIAGNOSIS — R351 Nocturia: Secondary | ICD-10-CM

## 2018-08-24 LAB — POC URINALSYSI DIPSTICK (AUTOMATED)
Bilirubin, UA: NEGATIVE
Blood, UA: NEGATIVE
Glucose, UA: NEGATIVE
Ketones, UA: NEGATIVE
Leukocytes, UA: NEGATIVE
Nitrite, UA: NEGATIVE
Protein, UA: NEGATIVE
Spec Grav, UA: 1.02 (ref 1.010–1.025)
Urobilinogen, UA: 0.2 E.U./dL
pH, UA: 6 (ref 5.0–8.0)

## 2018-08-24 NOTE — Progress Notes (Signed)
Nancy [name]

## 2018-08-26 LAB — URINE CULTURE
MICRO NUMBER: 101629
SPECIMEN QUALITY:: ADEQUATE

## 2018-09-03 ENCOUNTER — Other Ambulatory Visit: Payer: Self-pay

## 2018-09-03 MED ORDER — PREDNISONE 5 MG PO TABS: 5.0000 mg | ORAL_TABLET | Freq: Every day | ORAL | 1 refills | Status: DC

## 2018-09-03 MED FILL — predniSONE 5 MG TABS: 5 | 30 days supply | Qty: 30 | Fill #0

## 2018-09-13 ENCOUNTER — Encounter: Payer: Self-pay | Admitting: Obstetrics and Gynecology

## 2018-09-13 ENCOUNTER — Other Ambulatory Visit: Payer: Self-pay

## 2018-09-13 ENCOUNTER — Ambulatory Visit: Payer: 59 | Admitting: Obstetrics and Gynecology

## 2018-09-13 VITALS — BP 120/76 | HR 60 | Ht 62.6 in | Wt 237.8 lb

## 2018-09-13 DIAGNOSIS — Z01419 Encounter for gynecological examination (general) (routine) without abnormal findings: Secondary | ICD-10-CM

## 2018-09-13 DIAGNOSIS — N952 Postmenopausal atrophic vaginitis: Secondary | ICD-10-CM | POA: Diagnosis not present

## 2018-09-13 NOTE — Progress Notes (Signed)
53 y.o. G55P2001 Married White or Caucasian Not Hispanic or Latino female here for annual exam.  H/O hysterectomy then subsequent laparoscopy with BSO/LOA. She had a mid urethral sling placed years ago, it was trimmed in 10/14 by GYN and removed last month by Urology (Dr Matilde Sprang). She has been on vaginal estrogen for the last month or so, ran out. She does have vaginal dryness and a h/o mild discomfort even with lubricant.  She isn't leaking with cough or sneeze. She has noticed a slight increase in urinary urgency since surgery. She can have urge incontinence, large amount. Just treated for a UTI.  She drinks 2-3 cups of tea a day with caffeine. The Urologist gave her Myrbetriq.  No bowel issues.     No LMP recorded. Patient has had a hysterectomy.          Sexually active: Yes.    The current method of family planning is status post hysterectomy.    Exercising: No.  The patient does not participate in regular exercise at present. Smoker:  no  Health Maintenance: Pap:  Patient is unsure, hysterectomy in 1997 History of abnormal Pap:  Yes, may years ago per patient, no dysplasia. MMG:  05/11/2018 Birads 1 negative BMD:   Never Colonoscopy: 11/27/2015 polyps, repeat 5 years TDaP:  08/26/2015 Gardasil: N/A  reports that she has never smoked. She has never used smokeless tobacco. She reports current alcohol use. She reports that she does not use drugs. Rare ETOH. 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 Daughter is 67, married, son is 97. No grand children.  Past Medical History:  Diagnosis Date  . Anemia   . Asthma   . Bradycardia   . Chicken pox   . Dysrhythmia    occ. palpitations  . Fibromyalgia   . Headache(784.0)   . Migraines   . Neck pain    bulging disk  . PONV (postoperative nausea and vomiting)    sometimes wakes up after surgery and cannot breath due to asthma, can be bradycardic  . Shortness of breath    with bradycardia    Past Surgical History:  Procedure Laterality Date  .  ABDOMINAL HYSTERECTOMY    . BACK SURGERY     cervical fusion C5,6,7 with a bulge at C4  . BLADDER SURGERY  July 10th 2013  . [place]  . CYSTOSCOPY N/A 07/31/2018   Procedure: CYSTOSCOPY;  Surgeon: Bjorn Loser, MD;  Location: [facility];  Service: Urology;  Laterality: N/A;  . DIAGNOSTIC LAPAROSCOPY    . EXCISION OF MESH N/A 07/31/2018   Procedure: REMOVAL OF VAGINAL MESH;  Surgeon: Bjorn Loser, MD;  Location: [facility];  Service: Urology;  Laterality: N/A;  . LAPAROSCOPY Bilateral 05/31/2013   Procedure: LAPAROSCOPY withbilateral salpingo-oophorectomy, lysis of adhesions;  Surgeon: Allena Katz, MD;  Location: [facility];  Service: Gynecology;  Laterality: Bilateral;  with clippings of Vaginal  mesh  . SALPINGOOPHORECTOMY Bilateral 05/31/2013   Procedure: BILATERAL SALPINGO OOPHORECTOMY/TRIM MESH;  Surgeon: Allena Katz, MD;  Location: [facility];  Service: Gynecology;  Laterality: Bilateral;  . TUBAL LIGATION     1995  . VAGINAL HYSTERECTOMY     1998 or 1997  . WRIST FRACTURE SURGERY  2005    Current Outpatient Medications  Medication Sig Dispense Refill  . acetaminophen (TYLENOL) 500 MG tablet Take 500 mg by mouth every 6 (six) hours as needed (pain).     . Albuterol Sulfate ([place])  108 (90 Base) MCG/ACT AEPB Inhale 1 puff into the lungs every 6 (six) hours as needed. 1 each 3  . Cholecalciferol (VITAMIN D3) 50 MCG (2000 UT) CHEW Chew by mouth.    . estradiol (ESTRACE VAGINAL) 0.1 MG/GM vaginal cream apply 2 g to vagina daily for 2 weeks then taper dose to 2gms two times per week 42.5 g 2  . furosemide (LASIX) 20 MG tablet Take 1 tablet (20 mg total) by mouth daily as needed for fluid or edema (up to 3 days). (Patient taking differently: Take 20 mg by mouth daily as needed for fluid or edema (fluid retention up to 3 days). ) 30 tablet 1  . gabapentin (NEURONTIN) 300 MG capsule TAKE 1 CAPSULE BY MOUTH 2 TIMES DAILY. (Patient taking  differently: Take 300 mg by mouth See admin instructions. Take 1 capsule (300 mg) by mouth scheduled every morning, may repeat 1 capsule (300 mg) by mouth in the evening if needed for pain.) 180 capsule 1  . hydrochlorothiazide (HYDRODIURIL) 25 MG tablet TAKE 1 TABLET BY MOUTH ONCE DAILY AS NEEDED FOR EDEMA (Patient taking differently: Take 25 mg by mouth daily as needed (fluid retention/edema). TAKE 1 TABLET BY MOUTH ONCE DAILY AS NEEDED FOR EDEMA) 90 tablet 1  . predniSONE (DELTASONE) 5 MG tablet Take 1 tablet (5 mg total) by mouth daily with breakfast. 30 tablet 1  . traMADol (ULTRAM) 50 MG tablet Take one-two tablets every 4-6 hours as needed for moderate pain. 20 tablet 0  . phentermine (ADIPEX-P) 37.5 MG tablet Take 1 tablet (37.5 mg total) by mouth daily before breakfast. (Patient not taking: Reported on 09/13/2018) 30 tablet 2   No current facility-administered medications for this visit.   Diuretic for swelling.   Family History  Problem Relation Age of Onset  . Hyperlipidemia Mother        75 in 2019  . Arthritis Mother   . Osteoporosis Mother   . Lumbar disc disease Mother        herniated discs.   Marland Kitchen COPD Mother   . Hypertension Father        doesnt know his history well- no recent contact  . Heart disease Maternal Grandmother   . Diabetes Maternal Grandmother   . Cervical cancer Maternal Grandmother   . Heart disease Maternal Grandfather   . Cancer - Colon Maternal Grandfather   . Diabetes Paternal Grandmother   . Lung cancer Paternal Grandmother        dad's parents stepped in to help when dad not present  . Other Sister        doesnt know history well  . Lumbar disc disease Brother        grew up with him   . Atrial fibrillation Paternal Grandfather        required a few shocks  . Bradycardia Paternal Grandfather   . Hypertension Paternal Grandfather   . Other Brother        doesnt know history well    Review of Systems  Constitutional: Negative.   HENT:  Negative.   Eyes: Negative.   Respiratory: Negative.   Cardiovascular: Negative.   Gastrointestinal: Negative.   Endocrine: Negative.   Genitourinary:       Vaginal dryness  Musculoskeletal: Negative.   Skin: Negative.   Allergic/Immunologic: Negative.   Neurological: Negative.   Hematological: Negative.   Psychiatric/Behavioral: Negative.     Exam:   BP 120/76 (BP Location: Right Arm, Patient Position: Sitting, Cuff Size:  Large)   Pulse 60   Ht 5' 2.6" (1.59 m)   Wt 237 lb 12.8 oz (107.9 kg)   BMI 42.67 kg/m   Weight change: @WEIGHTCHANGE @ Height:   Height: 5' 2.6" (159 cm)  Ht Readings from Last 3 Encounters:  09/13/18 5' 2.6" (1.59 m)  07/31/18 5' 2.5" (1.588 m)  10/14/15 5' 2.5" (1.588 m)    General appearance: alert, cooperative and appears stated age Head: Normocephalic, without obvious abnormality, atraumatic Neck: no adenopathy, supple, symmetrical, trachea midline and thyroid normal to inspection and palpation Lungs: clear to auscultation bilaterally Cardiovascular: regular rate and rhythm Breasts: normal appearance, no masses or tenderness Abdomen: soft, non-tender; non distended,  no masses,  no organomegaly Extremities: extremities normal, atraumatic, no cyanosis or edema Skin: Skin color, texture, turgor normal. No rashes or lesions Lymph nodes: Cervical, supraclavicular, and axillary nodes normal. No abnormal inguinal nodes palpated Neurologic: Grossly normal   Pelvic: External genitalia:  no lesions              Urethra:  normal appearing urethra with no masses, tenderness or lesions              Bartholins and Skenes: normal                 Vagina: normal appearing vagina with normal color and discharge, no lesions, Stitches seen in the anterior distal vagina, well healed mucosa, no mesh seen or felt.               Cervix: absent               Bimanual Exam:  Uterus:  uterus absent              Adnexa: no mass, fullness, tenderness                Rectovaginal: Confirms               Anus:  normal sphincter tone, no lesions  Chaperone was present for exam.  A:  Well Woman with normal exam  Vaginal atrophy/dryness  P:   No pap needed  Discussed breast self exam  Discussed calcium and vit D intake  Labs with primary  Generic vagifem  Mammogram and colonoscopy UTD

## 2018-09-13 NOTE — Patient Instructions (Signed)

## 2018-09-24 MED FILL — GABAPENTIN 300 MG CAPSULE: 300 | 90 days supply | Qty: 180 | Fill #1

## 2018-10-08 ENCOUNTER — Telehealth: Payer: Self-pay | Admitting: Obstetrics and Gynecology

## 2018-10-08 MED ORDER — ESTRADIOL 10 MCG VA TABS: 1.0000 | ORAL_TABLET | VAGINAL | 3 refills | Status: DC

## 2018-10-08 MED FILL — ESTRADIOL 10 MCG TABS: 10 | 84 days supply | Qty: 24 | Fill #0

## 2018-10-08 NOTE — Telephone Encounter (Signed)
Patient called and said a new prescription for Vagifem tablets was supposed to be sent in but the pharmacy has not received it.  [place]

## 2018-10-08 NOTE — Telephone Encounter (Signed)
Patient here for aex on 09-13-2018. Per review of note, generic vagifem discussed. No RX sent. Please advise.

## 2018-10-08 NOTE — Telephone Encounter (Signed)
The script has been sent, please apologize for the delay.

## 2018-10-09 NOTE — Telephone Encounter (Signed)
Spoke with patient, advised as seen below per Dr. Jertson. Patient verbalizes understanding and is agreeable. Encounter closed.  

## 2018-10-12 MED FILL — predniSONE 5 MG TABS: 5 | 30 days supply | Qty: 30 | Fill #1

## 2018-10-16 DIAGNOSIS — R350 Frequency of micturition: Secondary | ICD-10-CM | POA: Diagnosis not present

## 2018-10-16 DIAGNOSIS — R3914 Feeling of incomplete bladder emptying: Secondary | ICD-10-CM | POA: Diagnosis not present

## 2018-10-31 ENCOUNTER — Other Ambulatory Visit: Payer: Self-pay | Admitting: Family Medicine

## 2018-11-01 ENCOUNTER — Ambulatory Visit (INDEPENDENT_AMBULATORY_CARE_PROVIDER_SITE_OTHER): Payer: 59 | Admitting: Family Medicine

## 2018-11-01 ENCOUNTER — Encounter: Payer: Self-pay | Admitting: Family Medicine

## 2018-11-01 VITALS — BP 118/80 | HR 56 | Temp 97.9°F | Ht 62.0 in | Wt 244.0 lb

## 2018-11-01 DIAGNOSIS — E785 Hyperlipidemia, unspecified: Secondary | ICD-10-CM | POA: Diagnosis not present

## 2018-11-01 DIAGNOSIS — M797 Fibromyalgia: Secondary | ICD-10-CM

## 2018-11-01 DIAGNOSIS — Z6841 Body Mass Index (BMI) 40.0 and over, adult: Secondary | ICD-10-CM | POA: Diagnosis not present

## 2018-11-01 DIAGNOSIS — E559 Vitamin D deficiency, unspecified: Secondary | ICD-10-CM

## 2018-11-01 DIAGNOSIS — Z0000 Encounter for general adult medical examination without abnormal findings: Secondary | ICD-10-CM | POA: Diagnosis not present

## 2018-11-01 DIAGNOSIS — R5383 Other fatigue: Secondary | ICD-10-CM

## 2018-11-01 NOTE — Progress Notes (Addendum)
Phone: [phone]   Subjective:  Patient presents today for their annual physical. Chief complaint-noted.   See problem oriented charting- ROS- full  review of systems was completed and negative except for: myalgias, knee pain, palpitations  The following were reviewed and entered/updated in epic: Past Medical History:  Diagnosis Date  . Anemia   . Asthma   . Bradycardia   . Chicken pox   . Dysrhythmia    occ. palpitations  . Fibromyalgia   . Headache(784.0)   . Migraines   . Neck pain    bulging disk  . PONV (postoperative nausea and vomiting)    sometimes wakes up after surgery and cannot breath due to asthma, can be bradycardic  . Shortness of breath    with bradycardia   Patient Active Problem List   Diagnosis Date Noted  . Fibromyalgia 04/12/2018    Priority: High  . Vaginal dryness 04/12/2018    Priority: Medium  . Morbid obesity ([place]) 04/12/2018    Priority: Medium  . History of colon polyps 04/12/2018    Priority: Medium  . Primary osteoarthritis of both knees 12/28/2017    Priority: Medium  . Mild intermittent asthma 11/20/2013    Priority: Medium  . Bradycardia 07/04/2012    Priority: Medium  . Vitamin D deficiency 04/12/2018    Priority: Low  . History of total hysterectomy 04/12/2018    Priority: Low  . Insomnia 04/12/2018    Priority: Low  . Cervicogenic headache 03/26/2014    Priority: Low  . Cervical radiculopathy 03/26/2014    Priority: Low   Past Surgical History:  Procedure Laterality Date  . ABDOMINAL HYSTERECTOMY    . BACK SURGERY     cervical fusion C5,6,7 with a bulge at C4  . BLADDER SURGERY  July 10th 2013  . [place]  . CYSTOSCOPY N/A 07/31/2018   Procedure: CYSTOSCOPY;  Surgeon: Bjorn Loser, MD;  Location: [facility];  Service: Urology;  Laterality: N/A;  . DIAGNOSTIC LAPAROSCOPY    . EXCISION OF MESH N/A 07/31/2018   Procedure: REMOVAL OF VAGINAL MESH;  Surgeon: Bjorn Loser, MD;   Location: [facility];  Service: Urology;  Laterality: N/A;  . LAPAROSCOPY Bilateral 05/31/2013   Procedure: LAPAROSCOPY withbilateral salpingo-oophorectomy, lysis of adhesions;  Surgeon: Allena Katz, MD;  Location: [facility];  Service: Gynecology;  Laterality: Bilateral;  with clippings of Vaginal  mesh  . SALPINGOOPHORECTOMY Bilateral 05/31/2013   Procedure: BILATERAL SALPINGO OOPHORECTOMY/TRIM MESH;  Surgeon: Allena Katz, MD;  Location: [facility];  Service: Gynecology;  Laterality: Bilateral;  . TUBAL LIGATION     1995  . VAGINAL HYSTERECTOMY     1998 or 1997  . WRIST FRACTURE SURGERY  2005    Family History  Problem Relation Age of Onset  . Hyperlipidemia Mother        32 in 2019  . Arthritis Mother   . Osteoporosis Mother   . Lumbar disc disease Mother        herniated discs.   Marland Kitchen COPD Mother   . Hypertension Father        doesnt know his history well- no recent contact  . Heart disease Maternal Grandmother   . Diabetes Maternal Grandmother   . Cervical cancer Maternal Grandmother   . Heart disease Maternal Grandfather   . Cancer - Colon Maternal Grandfather   . Diabetes Paternal Grandmother   . Lung cancer Paternal Grandmother  dad's parents stepped in to help when dad not present  . Other Sister        doesnt know history well  . Lumbar disc disease Brother        grew up with him   . Atrial fibrillation Paternal Grandfather        required a few shocks  . Bradycardia Paternal Grandfather   . Hypertension Paternal Grandfather   . Other Brother        doesnt know history well    Medications- reviewed and updated Current Outpatient Medications  Medication Sig Dispense Refill  . acetaminophen (TYLENOL) 500 MG tablet Take 500 mg by mouth every 6 (six) hours as needed (pain).     . Albuterol Sulfate ([place]) 161 (90 Base) MCG/ACT AEPB Inhale 1 puff into the lungs every 6 (six) hours as needed. 1 each 3  . Cholecalciferol (VITAMIN D3) 50 MCG (2000  UT) CHEW Chew by mouth.    . Estradiol 10 MCG TABS vaginal tablet Place 1 tablet (10 mcg total) vaginally 2 (two) times a week. 24 tablet 3  . furosemide (LASIX) 20 MG tablet Take 1 tablet (20 mg total) by mouth daily as needed for fluid or edema (up to 3 days). (Patient taking differently: Take 20 mg by mouth daily as needed for fluid or edema (fluid retention up to 3 days). ) 30 tablet 1  . gabapentin (NEURONTIN) 300 MG capsule TAKE 1 CAPSULE BY MOUTH 2 TIMES DAILY. (Patient taking differently: Take 300 mg by mouth See admin instructions. Take 1 capsule (300 mg) by mouth scheduled every morning, may repeat 1 capsule (300 mg) by mouth in the evening if needed for pain.) 180 capsule 1  . hydrochlorothiazide (HYDRODIURIL) 25 MG tablet TAKE 1 TABLET BY MOUTH ONCE DAILY AS NEEDED FOR EDEMA (Patient taking differently: Take 25 mg by mouth daily as needed (fluid retention/edema). TAKE 1 TABLET BY MOUTH ONCE DAILY AS NEEDED FOR EDEMA) 90 tablet 1  . predniSONE (DELTASONE) 5 MG tablet Take 1 tablet (5 mg total) by mouth daily with breakfast. 30 tablet 1  . traMADol (ULTRAM) 50 MG tablet Take one-two tablets every 4-6 hours as needed for moderate pain. 20 tablet 0  . phentermine (ADIPEX-P) 37.5 MG tablet Take 1 tablet (37.5 mg total) by mouth daily before breakfast. (Patient not taking: Reported on 11/01/2018) 30 tablet 2   Allergies-reviewed and updated Allergies  Allergen Reactions  . Advair Diskus [Fluticasone-Salmeterol] Cough    excessive  . Erythromycin Diarrhea and Nausea And Vomiting  . Percocet [Oxycodone-Acetaminophen] Other (See Comments)    hallucinations   Social History   Social History Narrative   Married to husband Shanon Brow. Son Gerald Stabs and Daughter Reggy Eye.       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       Hobbies: movies, reading, hiking   Objective  Objective:  BP 118/80 (BP Location: Left Arm, Patient Position: Sitting, Cuff Size: Large)   Pulse (!) 56   Temp 97.9 F (36.6 C) (Oral)    Ht 5\' 2"  (1.575 m)   Wt 244 lb (110.7 kg)   SpO2 97%   BMI 44.63 kg/m  Gen: NAD, resting comfortably HEENT: Mucous membranes are moist. Oropharynx normal Neck: no thyromegaly CV: RRR no murmurs rubs or gallops Lungs: CTAB no crackles, wheeze, rhonchi Abdomen: soft/nontender/nondistended/normal bowel sounds. No rebound or guarding.  Ext: no edema Skin: warm, dry Neuro: grossly normal, moves all extremities, PERRLA   Assessment and Plan  53 y.o. female presenting for annual physical.  Health Maintenance counseling: 1. Anticipatory guidance: Patient counseled regarding regular dental exams -q6 months, eye exams - yearly but cancelled recently,  avoiding smoking and second hand smoke , limiting alcohol to 1 beverage per day- doesn't even do that- rare social use .   2. Risk factor reduction: morbid obesity noted.   Advised patient of need for regular exercise and diet rich and fruits and vegetables to reduce risk of heart attack and stroke. Exercise- restarted walking last week- her and husband are hopping back on the wagon for weight loss journey- both have been able to make significant progress in past when doing it together. Diet-she thinks cutting down snacking after supper would be good- is going to try lower calorie snacks or just avoid buying unhealthy foods.  Wt Readings from Last 3 Encounters:  11/01/18 244 lb (110.7 kg)  09/13/18 237 lb 12.8 oz (107.9 kg)  07/31/18 230 lb (104.3 kg)  3. Immunizations/screenings/ancillary studies- shingrix hopefully next year due to potential flu like illness and we are in middle of covid 19 Immunization History  Administered Date(s) Administered  . Influenza,inj,Quad PF,6+ Mos 04/24/2018  . Influenza-Unspecified 05/02/2015, 05/04/2016, 05/08/2017  . Tdap 08/26/2015  4. Cervical cancer screening- history of total hysterectomy for benign reasons 5. Breast cancer screening-  breast exam with Dr. Talbert Nan and mammogram - May 10, 2018 6. Colon  cancer screening -April 2017 with 5-year repeatdue to precancerous polyp 7. Skin cancer screening- no recent dermatologist. advised regular sunscreen use. Denies worrisome, changing, or new skin lesions.  8. Birth control/STD check-monogamous and status post hysterectomy 9. Osteoporosis screening at 65-we will plan on this. Family history of osteoporosis and had early screen in 2011 and normal bone density- will plan on repeat at 65 since was normal.  10. never smoker  Status of chronic or acute concerns   Fibromyalgia-remains on gabapentin  - Dr. Juleen China ordered 5 mg prednisone last month. Has 10 pills left. She is going to start spacing these out before stopping- was really helpful but winter months are hard - occasionally uses tramadol for breakthrough pain.   Weight management through Dr. Juleen China- she is thinking about restarting phentermine through her.   Vaginal dryness- using Estrace cream twice a week- now on estradiol vaginal tablets  Arthritis of both knees-history of injections- last one done 6 months ago  Mild intermittent asthma-has inhaler on hand but has not had to use   Has seen Dr. Martinique in the past in 2013 for bradycardia- heart monitor showed bradycardia with some PVCs and PACs.  Vitamin D- deficiency in the past-on 2000 units daily. Will check viatmain d with labs  Edema-uses sparing Lasix. Has to use hctz most days. Tried compression stockings but really bothered her legs.   Mild hyperlipidemia in past- update lipids- likely before starting statin focus on lifestyle changes first and see if we can make some improvements unless significantly high ascvd risk calculated  Has some vertical ridges in fingers 2-5 and on thumbs has some horizontal ridges- seemed to get better and then recnetly worsened again.   Future Appointments  Date Time Provider [place]  09/19/2019  3:00 PM Salvadore Dom, MD [place] None   Return in about 1 year (around 11/01/2019)  for physical.  Lab/Order associations: will get fasting labs tomorrow  Preventative health care - Plan: CBC, Comprehensive metabolic panel, Lipid panel, VITAMIN D 25 Hydroxy (Vit-D Deficiency, Fractures), Vitamin B12  Vitamin D deficiency -  Plan: VITAMIN D 25 Hydroxy (Vit-D Deficiency, Fractures)  Fibromyalgia  Hyperlipidemia, unspecified hyperlipidemia type - Plan: CBC, Comprehensive metabolic panel, Lipid panel  Fatigue, unspecified type - Plan: Vitamin B12  Return precautions advised.  Garret Reddish, MD

## 2018-11-01 NOTE — Patient Instructions (Addendum)
Come back for fasting labs tomorrow  Glad you are getting back on the walking/weight loss journey and that you are pushing hubby there too! You guys can do this together.

## 2018-11-02 LAB — COMPREHENSIVE METABOLIC PANEL
ALT: 15 U/L (ref 0–35)
AST: 15 U/L (ref 0–37)
Albumin: 4.2 g/dL (ref 3.5–5.2)
Alkaline Phosphatase: 102 U/L (ref 39–117)
BUN: 18 mg/dL (ref 6–23)
CO2: 34 mEq/L — ABNORMAL HIGH (ref 19–32)
Calcium: 9.4 mg/dL (ref 8.4–10.5)
Chloride: 93 mEq/L — ABNORMAL LOW (ref 96–112)
Creatinine, Ser: 0.76 mg/dL (ref 0.40–1.20)
GFR: 78.95 mL/min (ref >60.00)
Glucose, Bld: 80 mg/dL (ref 70–99)
Potassium: 3.6 mEq/L (ref 3.5–5.1)
Sodium: 138 mEq/L (ref 135–145)
Total Bilirubin: 0.5 mg/dL (ref 0.2–1.2)
Total Protein: 7.4 g/dL (ref 6.0–8.3)

## 2018-11-02 LAB — LIPID PANEL
Cholesterol: 258 mg/dL — ABNORMAL HIGH (ref 0–200)
HDL: 87.8 mg/dL (ref >39.00)
LDL Cholesterol: 147 mg/dL — ABNORMAL HIGH (ref 0–99)
NonHDL: 170.04
Total CHOL/HDL Ratio: 3
Triglycerides: 113 mg/dL (ref 0.0–149.0)
VLDL: 22.6 mg/dL (ref 0.0–40.0)

## 2018-11-02 LAB — CBC
HCT: 41 % (ref 36.0–46.0)
Hemoglobin: 13.4 g/dL (ref 12.0–15.0)
MCHC: 32.7 g/dL (ref 30.0–36.0)
MCV: 81.9 fl (ref 78.0–100.0)
Platelets: 215 10*3/uL (ref 150.0–400.0)
RBC: 5.01 Mil/uL (ref 3.87–5.11)
RDW: 13.6 % (ref 11.5–15.5)
WBC: 7.4 10*3/uL (ref 4.0–10.5)

## 2018-11-02 LAB — VITAMIN D 25 HYDROXY (VIT D DEFICIENCY, FRACTURES): VITD: 26.28 ng/mL — ABNORMAL LOW (ref 30.00–100.00)

## 2018-11-02 LAB — VITAMIN B12: Vitamin B-12: 236 pg/mL (ref 211–911)

## 2018-11-02 NOTE — Addendum Note (Signed)
Addended by: Francis Dowse T on: 11/02/2018 08:40 AM   Modules accepted: Orders

## 2018-11-05 ENCOUNTER — Other Ambulatory Visit: Payer: Self-pay

## 2018-11-05 MED ORDER — VITAMIN D (ERGOCALCIFEROL) 1.25 MG (50000 UNIT) PO CAPS: 50000.0000 [IU] | ORAL_CAPSULE | ORAL | 0 refills | Status: DC

## 2018-11-05 MED ORDER — CHOLECALCIFEROL 1.25 MG (50000 UT) PO TABS: ORAL_TABLET | ORAL | 0 refills | Status: DC

## 2018-11-05 NOTE — Addendum Note (Signed)
Addended by: Marin Olp on: 11/05/2018 03:14 PM   Modules accepted: Orders

## 2018-11-06 ENCOUNTER — Telehealth: Payer: Self-pay | Admitting: Family Medicine

## 2018-11-06 NOTE — Telephone Encounter (Signed)
Copied from [place] [id number]. Topic: General - Other >> Nov 06, 2018  3:22 PM Richardo Priest, [place] wrote: Manuela Schwartz, from [facility] out patient pharmacy, called in regards to two medications that have been prescribed to a patient and would like clarification on them. Call back number is [phone]

## 2018-11-07 ENCOUNTER — Other Ambulatory Visit: Payer: Self-pay

## 2018-11-07 MED FILL — VIT D2 1.25 MG (50,000 UNIT: 1.25 MG | 84 days supply | Qty: 12 | Fill #0

## 2018-11-07 NOTE — Telephone Encounter (Signed)
Rx has been clarified. No further action needed!

## 2018-11-30 ENCOUNTER — Other Ambulatory Visit: Payer: Self-pay | Admitting: Family Medicine

## 2018-12-05 ENCOUNTER — Other Ambulatory Visit: Payer: Self-pay

## 2018-12-05 MED ORDER — HYDROCHLOROTHIAZIDE 25 MG PO TABS: 25.0000 mg | ORAL_TABLET | Freq: Every day | ORAL | 1 refills | Status: DC | PRN

## 2018-12-05 MED FILL — HYDROCHLOROTHIAZIDE 25 MG T: 25 | 90 days supply | Qty: 90 | Fill #0

## 2018-12-05 NOTE — Telephone Encounter (Signed)
Rx refill Hydrochlorothiazide 25mg  Last fill 01/17/18  #90/1

## 2018-12-11 ENCOUNTER — Ambulatory Visit: Payer: 59 | Admitting: Family Medicine

## 2018-12-11 ENCOUNTER — Encounter: Payer: Self-pay | Admitting: Family Medicine

## 2018-12-11 ENCOUNTER — Other Ambulatory Visit: Payer: Self-pay

## 2018-12-11 VITALS — BP 118/70 | HR 57 | Temp 98.3°F | Ht 62.0 in | Wt 240.0 lb

## 2018-12-11 DIAGNOSIS — M797 Fibromyalgia: Secondary | ICD-10-CM | POA: Diagnosis not present

## 2018-12-11 MED ORDER — GABAPENTIN 300 MG PO CAPS: 300.0000 mg | ORAL_CAPSULE | Freq: Three times a day (TID) | ORAL | 3 refills | Status: DC

## 2018-12-11 MED FILL — GABAPENTIN 300 MG CAPSULE: 300 | 90 days supply | Qty: 270 | Fill #0

## 2018-12-11 NOTE — Assessment & Plan Note (Signed)
S: Patient reports since coming off of prednisone she has had worsening pain from the waist down.  Seems to get worse to further away from the prednisone she gets.  Denies any increased back pain.  Pain is symmetrical.  No paresthesias or weakness in the leg.  Does feel somewhat stiff in the legs at times.  Feels slightly off balance at times.  Does report perhaps low strength in the hands at times.  She is currently taking 300 mg gabapentin in the morning and before bed-after she takes this it seems to be somewhat helpful.  Feels like pain at its worst is up to about a 5 out of 10.  Gets stiff with sitting prolonged periods.  She is interested in potentially increasing the dose of gabapentin.  She sparingly uses tramadol- usually takes it near bedtime.  Usually from 4:00 PM to bedtime is the worst of her pain. A/P: Poor control of pain- continue tramadol as needed.  I do think titrating her gabapentin to 300 mg 3 times daily would be reasonable- can trial 300 mg dose when she gets home from work if it does not make her too sedated- could even consider taking this during afternoon if does not make her too fatigued.

## 2018-12-11 NOTE — Progress Notes (Addendum)
Phone [phone]   Subjective:  Nancy [name] is a 53 y.o. year old very pleasant female patient who presents for/with See problem oriented charting ROS- No fever, chills, cough, shortness of breath, body aches, sore throat, or loss of taste or smell. Intermittent edema in legs   Past Medical History-  Patient Active Problem List   Diagnosis Date Noted  . Fibromyalgia 04/12/2018    Priority: High  . Vaginal dryness 04/12/2018    Priority: Medium  . Morbid obesity ([place]) 04/12/2018    Priority: Medium  . History of colon polyps 04/12/2018    Priority: Medium  . Primary osteoarthritis of both knees 12/28/2017    Priority: Medium  . Mild intermittent asthma 11/20/2013    Priority: Medium  . Bradycardia 07/04/2012    Priority: Medium  . Vitamin D deficiency 04/12/2018    Priority: Low  . History of total hysterectomy 04/12/2018    Priority: Low  . Insomnia 04/12/2018    Priority: Low  . Cervicogenic headache 03/26/2014    Priority: Low  . Cervical radiculopathy 03/26/2014    Priority: Low    Medications- reviewed and updated Current Outpatient Medications  Medication Sig Dispense Refill  . acetaminophen (TYLENOL) 500 MG tablet Take 500 mg by mouth every 6 (six) hours as needed (pain).     . Albuterol Sulfate (PROAIR RESPICLICK) 458 (90 Base) MCG/ACT AEPB Inhale 1 puff into the lungs every 6 (six) hours as needed. 1 each 3  . Cholecalciferol (VITAMIN D3) 50 MCG (2000 UT) CHEW Chew by mouth.    . Estradiol 10 MCG TABS vaginal tablet Place 1 tablet (10 mcg total) vaginally 2 (two) times a week. 24 tablet 3  . furosemide (LASIX) 20 MG tablet Take 1 tablet (20 mg total) by mouth daily as needed for fluid or edema (up to 3 days). (Patient taking differently: Take 20 mg by mouth daily as needed for fluid or edema (fluid retention up to 3 days). ) 30 tablet 1  . gabapentin (NEURONTIN) 300 MG capsule Take 1 capsule (300 mg total) by mouth 3 (three) times daily. 270 capsule 3   . hydrochlorothiazide (HYDRODIURIL) 25 MG tablet Take 1 tablet (25 mg total) by mouth daily as needed (fluid retention/edema). TAKE 1 TABLET BY MOUTH ONCE DAILY AS NEEDED FOR EDEMA 90 tablet 1  . phentermine (ADIPEX-P) 37.5 MG tablet Take 1 tablet (37.5 mg total) by mouth daily before breakfast. 30 tablet 2  . traMADol (ULTRAM) 50 MG tablet Take one-two tablets every 4-6 hours as needed for moderate pain. 20 tablet 0  . Vitamin D, Ergocalciferol, (DRISDOL) 1.25 MG (50000 UT) CAPS capsule Take 1 capsule (50,000 Units total) by mouth every 7 (seven) days. 12 capsule 0   No current facility-administered medications for this visit.      Objective:  BP 118/70   Pulse (!) 57   Temp 98.3 F (36.8 C) (Oral)   Ht 5\' 2"  (1.575 m)   Wt 240 lb (108.9 kg)   SpO2 97%   BMI 43.90 kg/m  Gen: NAD, resting comfortably CV: RRR no murmurs rubs or gallops Lungs: CTAB no crackles, wheeze, rhonchi Abdomen: Obese Skin: warm, dry, no rash over lower legs Neuro: Good strength bilateral lower extremities, normal reflexes.  Intact to gross touch    Assessment and Plan   #Morbid obesity S: In just over a month patient is down 4 pounds-congratulated her on effort.  She feels like being off the prednisone has certainly helped her.  She has also tried to watch the foods she has been eating A/P: Improving-congratulated patient on effort.  Also has phentermine from Dr. Juleen China. Encouraged need for healthy eating, regular exercise, weight loss.    Fibromyalgia S: Patient reports since coming off of prednisone she has had worsening pain from the waist down.  Seems to get worse to further away from the prednisone she gets.  Denies any increased back pain.  Pain is symmetrical.  No paresthesias or weakness in the leg.  Does feel somewhat stiff in the legs at times.  Feels slightly off balance at times.  Does report perhaps low strength in the hands at times.  She is currently taking 300 mg gabapentin in the morning  and before bed-after she takes this it seems to be somewhat helpful.  Feels like pain at its worst is up to about a 5 out of 10.  Gets stiff with sitting prolonged periods.  She is interested in potentially increasing the dose of gabapentin.  She sparingly uses tramadol- usually takes it near bedtime.  Usually from 4:00 PM to bedtime is the worst of her pain. A/P: Poor control of pain- continue tramadol as needed.  I do think titrating her gabapentin to 300 mg 3 times daily would be reasonable- can trial 300 mg dose when she gets home from work if it does not make her too sedated- could even consider taking this during afternoon if does not make her too fatigued.  Addendum 12/20/2018- patient reports continued severe pain with cold/wet weather- wants to try 2 weeks prednisone 5 mg daily- sentin  Future Appointments  Date Time Provider [place]  09/19/2019  3:00 PM Salvadore Dom, MD [place] None   Lab/Order associations: Fibromyalgia  Morbid obesity ([place])  Meds ordered this encounter  Medications  . gabapentin (NEURONTIN) 300 MG capsule    Sig: Take 1 capsule (300 mg total) by mouth 3 (three) times daily.    Dispense:  270 capsule    Refill:  3   Return precautions advised.  Garret Reddish, MD

## 2018-12-11 NOTE — Patient Instructions (Addendum)
There are no preventive care reminders to display for this patient.  Depression screen PHQ 2/9 11/01/2018  Decreased Interest 0  Down, Depressed, Hopeless 0  PHQ - 2 Score 0  Some encounter information is confidential and restricted. Go to Review Flowsheets activity to see all data.

## 2018-12-20 DIAGNOSIS — R350 Frequency of micturition: Secondary | ICD-10-CM | POA: Insufficient documentation

## 2018-12-20 DIAGNOSIS — N3941 Urge incontinence: Secondary | ICD-10-CM | POA: Insufficient documentation

## 2018-12-20 MED ORDER — PREDNISONE 5 MG PO TABS: 5.0000 mg | ORAL_TABLET | Freq: Every day | ORAL | 0 refills | Status: DC

## 2018-12-20 MED FILL — predniSONE 5 MG TABS: 5 | 14 days supply | Qty: 14 | Fill #0

## 2018-12-20 NOTE — Addendum Note (Signed)
Addended by: Marin Olp on: 12/20/2018 09:13 AM   Modules accepted: Orders

## 2019-01-08 ENCOUNTER — Encounter: Payer: Self-pay | Admitting: Physical Therapy

## 2019-01-21 MED FILL — ESTRADIOL 10 MCG TABS: 10 | 84 days supply | Qty: 24 | Fill #1

## 2019-01-23 DIAGNOSIS — H5203 Hypermetropia, bilateral: Secondary | ICD-10-CM | POA: Diagnosis not present

## 2019-01-23 DIAGNOSIS — H524 Presbyopia: Secondary | ICD-10-CM | POA: Diagnosis not present

## 2019-02-14 ENCOUNTER — Other Ambulatory Visit: Payer: Self-pay | Admitting: Physician Assistant

## 2019-02-14 ENCOUNTER — Ambulatory Visit: Payer: 59 | Admitting: Family Medicine

## 2019-02-14 ENCOUNTER — Other Ambulatory Visit: Payer: Self-pay

## 2019-02-14 ENCOUNTER — Encounter: Payer: Self-pay | Admitting: Family Medicine

## 2019-02-14 VITALS — BP 120/84 | HR 49 | Temp 98.9°F | Ht 62.0 in

## 2019-02-14 DIAGNOSIS — E538 Deficiency of other specified B group vitamins: Secondary | ICD-10-CM | POA: Insufficient documentation

## 2019-02-14 DIAGNOSIS — M2560 Stiffness of unspecified joint, not elsewhere classified: Secondary | ICD-10-CM

## 2019-02-14 DIAGNOSIS — E559 Vitamin D deficiency, unspecified: Secondary | ICD-10-CM

## 2019-02-14 DIAGNOSIS — M797 Fibromyalgia: Secondary | ICD-10-CM

## 2019-02-14 DIAGNOSIS — M791 Myalgia, unspecified site: Secondary | ICD-10-CM

## 2019-02-14 MED ORDER — ONDANSETRON HCL 4 MG PO TABS: 4.0000 mg | ORAL_TABLET | Freq: Three times a day (TID) | ORAL | 0 refills | Status: DC | PRN

## 2019-02-14 MED FILL — ONDANSETRON HCL 4 MG TABLET: 4 | 7 days supply | Qty: 20 | Fill #0

## 2019-02-14 NOTE — Assessment & Plan Note (Signed)
myalgias/arthralgias S: patient states last 6 months have been hard- has never had period with this much prednisone. Usually once a year she needs prednisone. Long history of elevated ESR without clear rheumatological diagnosis. Has seen Dr. Amil Amen and Dr. Trudie Reed- she had the best experience with Dr. Charlestine Night- who started gabapentin. She has also had success with tramadol for pain over years. Often he flare ups of pain are associated with low-grade temperature elevation. Came off prednisone beginning of June after a month  was feeling better but pain steadily worsened-.  Her most recent symptoms started with chills last Tuesday and then had fever by Wednesday. Was having pain with walking already by Tuesday. Temperature up to 100.2 last Wednesday. Negative covid 19 testing. Temperature on Saturday down to 99.  She had 4 prednisone pills remaining but despite pain did not want to use as wanted to reassess bloodwork and ESR. Always stiff at night but had gradual worsening until it got really bad last week. Has morning stiffness as well that lasts well  She complains of Diffuse myalgias and arthralgias - particulraly waist down  She also complains of morning stiffness for over an hour which improves as the day goes on. A/P: Unclear etiology of myalgias and arthralgias- possible fibromyalgia?  Her ESR tends to run around 40 at baseline- we will update ESR and CRP.  Has had low vitamin D in the past now taking 2000 units.  Has had low vitamin B12 in the past and is taking B12.  We will also check B12 and vitamin D.  She has had negative ANAs, rheumatoid factors, anti-CCP's in the past but requests a repeat.  We will also check CBC and CMP.  Patient would really like to start prednisone for her pain but we opted to hold off at least until we get blood work but also attempted to hold off until rheumatologist can evaluate her while she is in acute pain-possible referral to [place].

## 2019-02-14 NOTE — Patient Instructions (Addendum)
There are no preventive care reminders to display for this patient.  Depression screen PHQ 2/9 11/01/2018  Decreased Interest 0  Down, Depressed, Hopeless 0  PHQ - 2 Score 0  Some encounter information is confidential and restricted. Go to Review Flowsheets activity to see all data.

## 2019-02-14 NOTE — Progress Notes (Signed)
Phone [phone]   Subjective:  Nancy [name] is a 53 y.o. year old very pleasant female patient who presents for/with See problem oriented charting Chief Complaint  Patient presents with  . Fibromyalgia   ROS- complains of diffuse myalgias. Low grade temps last week. Feels fatigued/run down. No fever/chills since last week.    Past Medical History-  Patient Active Problem List   Diagnosis Date Noted  . Fibromyalgia 04/12/2018    Priority: High  . Vaginal dryness 04/12/2018    Priority: Medium  . Morbid obesity ([place]) 04/12/2018    Priority: Medium  . History of colon polyps 04/12/2018    Priority: Medium  . Primary osteoarthritis of both knees 12/28/2017    Priority: Medium  . Mild intermittent asthma 11/20/2013    Priority: Medium  . Bradycardia 07/04/2012    Priority: Medium  . Vitamin B12 deficiency 02/14/2019    Priority: Low  . Vitamin D deficiency 04/12/2018    Priority: Low  . History of total hysterectomy 04/12/2018    Priority: Low  . Insomnia 04/12/2018    Priority: Low  . Cervicogenic headache 03/26/2014    Priority: Low  . Cervical radiculopathy 03/26/2014    Priority: Low  . Urge incontinence 12/20/2018  . Urinary frequency 12/20/2018    Medications- reviewed and updated Current Outpatient Medications  Medication Sig Dispense Refill  . acetaminophen (TYLENOL) 500 MG tablet Take 500 mg by mouth every 6 (six) hours as needed (pain).     . Albuterol Sulfate (PROAIR RESPICLICK) 275 (90 Base) MCG/ACT AEPB Inhale 1 puff into the lungs every 6 (six) hours as needed. 1 each 3  . Cholecalciferol (VITAMIN D3) 50 MCG (2000 UT) CHEW Chew by mouth.    . Estradiol 10 MCG TABS vaginal tablet Place 1 tablet (10 mcg total) vaginally 2 (two) times a week. 24 tablet 3  . furosemide (LASIX) 20 MG tablet Take 1 tablet (20 mg total) by mouth daily as needed for fluid or edema (up to 3 days). (Patient taking differently: Take 20 mg by mouth daily as needed for fluid  or edema (fluid retention up to 3 days). ) 30 tablet 1  . gabapentin (NEURONTIN) 300 MG capsule Take 1 capsule (300 mg total) by mouth 3 (three) times daily. 270 capsule 3  . hydrochlorothiazide (HYDRODIURIL) 25 MG tablet Take 1 tablet (25 mg total) by mouth daily as needed (fluid retention/edema). TAKE 1 TABLET BY MOUTH ONCE DAILY AS NEEDED FOR EDEMA 90 tablet 1  . ondansetron (ZOFRAN) 4 MG tablet Take 1 tablet (4 mg total) by mouth every 8 (eight) hours as needed for nausea or vomiting. 20 tablet 0  . predniSONE (DELTASONE) 5 MG tablet Take 1 tablet (5 mg total) by mouth daily with breakfast. 14 tablet 0  . traMADol (ULTRAM) 50 MG tablet Take one-two tablets every 4-6 hours as needed for moderate pain. 20 tablet 0  . phentermine (ADIPEX-P) 37.5 MG tablet Take 1 tablet (37.5 mg total) by mouth daily before breakfast. (Patient not taking: Reported on 02/14/2019) 30 tablet 2   No current facility-administered medications for this visit.      Objective:  BP 120/84 (BP Location: Left Arm, Patient Position: Sitting, Cuff Size: Large)   Pulse (!) 49   Temp 98.9 F (37.2 C) (Oral)   Ht '5\' 2"'$  (1.575 m)   SpO2 97%   BMI 43.90 kg/m  Gen: NAD, resting comfortably CV: RRR no murmurs rubs or gallops Lungs: CTAB no crackles, wheeze, rhonchi  Abdomen: soft/nontender/nondistended Ext: no edema Skin: warm, dry MSK: diffuse tenderness in muscle groups and joints- particularly in thighs, hips, neck. No obvious edema in joints.     Assessment and Plan   # Fibromyalgia/myalgias/arthralgias S: patient states last 6 months have been hard- has never had period with this much prednisone. Usually once a year she needs prednisone. Long history of elevated ESR without clear rheumatological diagnosis. Has seen Dr. Amil Amen and Dr. Trudie Reed- she had the best experience with Dr. Charlestine Night- who started gabapentin. She has also had success with tramadol for pain over years. Often he flare ups of pain are associated with  low-grade temperature elevation. Came off prednisone beginning of June after a month  was feeling better but pain steadily worsened-.  Her most recent symptoms started with chills last Tuesday and then had fever by Wednesday. Was having pain with walking already by Tuesday. Temperature up to 100.2 last Wednesday. Negative covid 19 testing. Temperature on Saturday down to 99.  She had 4 prednisone pills remaining but despite pain did not want to use as wanted to reassess bloodwork and ESR. Always stiff at night but had gradual worsening until it got really bad last week. Has morning stiffness as well that lasts well  She complains of Diffuse myalgias and arthralgias - particulraly waist down  She also complains of morning stiffness for over an hour which improves as the day goes on. A/P: Unclear etiology of myalgias and arthralgias- possible fibromyalgia?  Her ESR tends to run around 40 at baseline- we will update ESR and CRP.  Has had low vitamin D in the past now taking 2000 units.  Has had low vitamin B12 in the past and is taking B12.  We will also check B12 and vitamin D.  She has had negative ANAs, rheumatoid factors, anti-CCP's in the past but requests a repeat.  We will also check CBC and CMP.  We did not repeat TSH-has been normal in the past with flares  Patient would really like to start prednisone for her pain but we opted to hold off at least until we get blood work but also attempted to hold off until rheumatologist can evaluate her while she is in acute pain-possible referral to [place].  Continue tramadol for pain for now.  Recommended follow up: As needed for acute issue Future Appointments  Date Time Provider [place]  09/19/2019  3:00 PM Salvadore Dom, MD [place] None    Lab/Order associations:   ICD-10-CM   1. Myalgia  M79.10 Sedimentation rate    C-reactive protein    VITAMIN D 25 Hydroxy (Vit-D Deficiency, Fractures)    Vitamin B12    CBC with  Differential/Platelet    Comprehensive metabolic panel    CK (Creatine Kinase)    Rheumatoid Factor    Cyclic citrul peptide antibody, IgG (QUEST)    Antinuclear Antib (ANA)  2. Morning stiffness of joints  M25.60 Sedimentation rate    C-reactive protein    VITAMIN D 25 Hydroxy (Vit-D Deficiency, Fractures)    Vitamin B12    CBC with Differential/Platelet    Comprehensive metabolic panel    CK (Creatine Kinase)    Rheumatoid Factor    Cyclic citrul peptide antibody, IgG (QUEST)    Antinuclear Antib (ANA)  3. Fibromyalgia  M79.7   4. Vitamin D deficiency  E55.9   5. Vitamin B12 deficiency  E53.8    Return precautions advised.  Garret Reddish, MD

## 2019-02-15 LAB — CBC WITH DIFFERENTIAL/PLATELET
Basophils Absolute: 0.1 10*3/uL (ref 0.0–0.1)
Basophils Relative: 0.8 % (ref 0.0–3.0)
Eosinophils Absolute: 0.1 10*3/uL (ref 0.0–0.7)
Eosinophils Relative: 1 % (ref 0.0–5.0)
HCT: 40.5 % (ref 36.0–46.0)
Hemoglobin: 12.8 g/dL (ref 12.0–15.0)
Lymphocytes Relative: 26.2 % (ref 12.0–46.0)
Lymphs Abs: 1.6 10*3/uL (ref 0.7–4.0)
MCHC: 31.6 g/dL (ref 30.0–36.0)
MCV: 82.4 fl (ref 78.0–100.0)
Monocytes Absolute: 0.6 10*3/uL (ref 0.1–1.0)
Monocytes Relative: 10.1 % (ref 3.0–12.0)
Neutro Abs: 3.8 10*3/uL (ref 1.4–7.7)
Neutrophils Relative %: 61.9 % (ref 43.0–77.0)
Platelets: 221 10*3/uL (ref 150.0–400.0)
RBC: 4.91 Mil/uL (ref 3.87–5.11)
RDW: 14.4 % (ref 11.5–15.5)
WBC: 6.1 10*3/uL (ref 4.0–10.5)

## 2019-02-15 LAB — RHEUMATOID FACTOR: Rheumatoid fact SerPl-aCnc: 17 IU/mL — ABNORMAL HIGH (ref <14)

## 2019-02-15 LAB — ANA: Anti Nuclear Antibody (ANA): NEGATIVE

## 2019-02-15 LAB — CYCLIC CITRUL PEPTIDE ANTIBODY, IGG: Cyclic Citrullin Peptide Ab: <16 UNITS

## 2019-02-15 LAB — SEDIMENTATION RATE: Sed Rate: 82 mm/hr — ABNORMAL HIGH (ref 0–30)

## 2019-02-18 ENCOUNTER — Other Ambulatory Visit: Payer: Self-pay | Admitting: Physical Therapy

## 2019-02-18 ENCOUNTER — Telehealth: Payer: Self-pay | Admitting: Physical Therapy

## 2019-02-18 DIAGNOSIS — R768 Other specified abnormal immunological findings in serum: Secondary | ICD-10-CM

## 2019-02-18 NOTE — Telephone Encounter (Signed)
Copied from [place] [id number]. Topic: Referral - Question >> Feb 18, 2019  1:42 PM Nils Flack wrote: Reason for CRM: Kieth Brightly from [facility] is calling about referral and would like call back from coordinator.  Please call [phone] ext [phone]

## 2019-02-19 LAB — COMPREHENSIVE METABOLIC PANEL
ALT: 50 U/L — ABNORMAL HIGH (ref 0–35)
AST: 23 U/L (ref 0–37)
Albumin: 4.3 g/dL (ref 3.5–5.2)
Alkaline Phosphatase: 120 U/L — ABNORMAL HIGH (ref 39–117)
BUN: 15 mg/dL (ref 6–23)
CO2: 32 mEq/L (ref 19–32)
Calcium: 9.3 mg/dL (ref 8.4–10.5)
Chloride: 95 mEq/L — ABNORMAL LOW (ref 96–112)
Creatinine, Ser: 0.76 mg/dL (ref 0.40–1.20)
GFR: 78.86 mL/min (ref >60.00)
Glucose, Bld: 94 mg/dL (ref 70–99)
Potassium: 3.8 mEq/L (ref 3.5–5.1)
Sodium: 137 mEq/L (ref 135–145)
Total Bilirubin: 0.4 mg/dL (ref 0.2–1.2)
Total Protein: 7.4 g/dL (ref 6.0–8.3)

## 2019-02-19 LAB — CK: Total CK: 62 U/L (ref 7–177)

## 2019-02-19 LAB — VITAMIN B12: Vitamin B-12: 300 pg/mL (ref 211–911)

## 2019-02-19 LAB — VITAMIN D 25 HYDROXY (VIT D DEFICIENCY, FRACTURES): VITD: 33 ng/mL (ref 30.00–100.00)

## 2019-02-19 LAB — C-REACTIVE PROTEIN: CRP: 3.5 mg/dL (ref 0.5–20.0)

## 2019-02-19 NOTE — Addendum Note (Signed)
Addended by: Francis Dowse T on: 02/19/2019 03:51 PM   Modules accepted: Orders

## 2019-02-21 DIAGNOSIS — M25572 Pain in left ankle and joints of left foot: Secondary | ICD-10-CM | POA: Diagnosis not present

## 2019-02-21 DIAGNOSIS — M79642 Pain in left hand: Secondary | ICD-10-CM | POA: Diagnosis not present

## 2019-02-21 DIAGNOSIS — M25461 Effusion, right knee: Secondary | ICD-10-CM | POA: Diagnosis not present

## 2019-02-21 DIAGNOSIS — M1990 Unspecified osteoarthritis, unspecified site: Secondary | ICD-10-CM | POA: Diagnosis not present

## 2019-02-21 DIAGNOSIS — M25561 Pain in right knee: Secondary | ICD-10-CM | POA: Diagnosis not present

## 2019-02-21 DIAGNOSIS — M19071 Primary osteoarthritis, right ankle and foot: Secondary | ICD-10-CM | POA: Diagnosis not present

## 2019-02-21 DIAGNOSIS — M79671 Pain in right foot: Secondary | ICD-10-CM | POA: Diagnosis not present

## 2019-02-21 DIAGNOSIS — M2550 Pain in unspecified joint: Secondary | ICD-10-CM

## 2019-02-21 DIAGNOSIS — M0579 Rheumatoid arthritis with rheumatoid factor of multiple sites without organ or systems involvement: Secondary | ICD-10-CM | POA: Insufficient documentation

## 2019-02-21 DIAGNOSIS — M1712 Unilateral primary osteoarthritis, left knee: Secondary | ICD-10-CM | POA: Diagnosis not present

## 2019-02-21 DIAGNOSIS — M79641 Pain in right hand: Secondary | ICD-10-CM | POA: Diagnosis not present

## 2019-02-21 DIAGNOSIS — M25761 Osteophyte, right knee: Secondary | ICD-10-CM | POA: Diagnosis not present

## 2019-02-21 DIAGNOSIS — R700 Elevated erythrocyte sedimentation rate: Secondary | ICD-10-CM | POA: Diagnosis not present

## 2019-02-21 DIAGNOSIS — M25541 Pain in joints of right hand: Secondary | ICD-10-CM | POA: Diagnosis not present

## 2019-02-21 DIAGNOSIS — M47817 Spondylosis without myelopathy or radiculopathy, lumbosacral region: Secondary | ICD-10-CM | POA: Diagnosis not present

## 2019-02-21 DIAGNOSIS — Z79899 Other long term (current) drug therapy: Secondary | ICD-10-CM | POA: Diagnosis not present

## 2019-02-21 DIAGNOSIS — M1711 Unilateral primary osteoarthritis, right knee: Secondary | ICD-10-CM | POA: Diagnosis not present

## 2019-02-21 DIAGNOSIS — M24542 Contracture, left hand: Secondary | ICD-10-CM | POA: Diagnosis not present

## 2019-02-21 DIAGNOSIS — M797 Fibromyalgia: Secondary | ICD-10-CM | POA: Diagnosis not present

## 2019-02-21 DIAGNOSIS — M533 Sacrococcygeal disorders, not elsewhere classified: Secondary | ICD-10-CM | POA: Diagnosis not present

## 2019-02-21 HISTORY — DX: Rheumatoid arthritis with rheumatoid factor of multiple sites without organ or systems involvement: M05.79

## 2019-02-21 HISTORY — DX: Pain in unspecified joint: M25.50

## 2019-02-21 LAB — HEPATITIS B CORE ANTIBODY, TOTAL: Hep B Core Ab, Tot: NEGATIVE

## 2019-02-21 LAB — HEPATITIS B SURFACE ANTIGEN: HBsAg Conf: NEGATIVE

## 2019-02-21 LAB — HEP C AB W/REFL: Hep C Virus Ab: 0.1

## 2019-02-21 MED FILL — predniSONE 10 MG TABS: 10 | 21 days supply | Qty: 28 | Fill #0

## 2019-02-25 ENCOUNTER — Other Ambulatory Visit: Payer: Self-pay | Admitting: Physician Assistant

## 2019-02-25 DIAGNOSIS — M170 Bilateral primary osteoarthritis of knee: Secondary | ICD-10-CM

## 2019-02-25 LAB — HEPATIC FUNCTION PANEL
ALT: 36 — AB (ref 7–35)
AST: 21 (ref 13–35)
Alkaline Phosphatase: 128 — AB (ref 25–125)
Bilirubin, Total: 0.3

## 2019-02-25 LAB — BASIC METABOLIC PANEL
BUN: 15 (ref 4–21)
Creatinine: 0.8 (ref 0.5–1.1)
Glucose: 85
Potassium: 7 — AB (ref 3.4–5.3)
Sodium: 139 (ref 137–147)

## 2019-02-25 LAB — CBC AND DIFFERENTIAL
HCT: 41 (ref 36–46)
Hemoglobin: 12.5 (ref 12.0–16.0)
Platelets: 214 (ref 150–399)
WBC: 7.3

## 2019-02-26 ENCOUNTER — Other Ambulatory Visit: Payer: Self-pay | Admitting: Physician Assistant

## 2019-02-26 DIAGNOSIS — R7989 Other specified abnormal findings of blood chemistry: Secondary | ICD-10-CM

## 2019-03-01 ENCOUNTER — Encounter: Payer: Self-pay | Admitting: Physical Therapy

## 2019-03-01 ENCOUNTER — Encounter: Payer: Self-pay | Admitting: Family Medicine

## 2019-03-08 ENCOUNTER — Other Ambulatory Visit: Payer: 59

## 2019-03-08 ENCOUNTER — Other Ambulatory Visit: Payer: Self-pay

## 2019-03-08 DIAGNOSIS — R945 Abnormal results of liver function studies: Secondary | ICD-10-CM | POA: Diagnosis not present

## 2019-03-08 DIAGNOSIS — R7989 Other specified abnormal findings of blood chemistry: Secondary | ICD-10-CM

## 2019-03-08 NOTE — Addendum Note (Signed)
Addended by: Francis Dowse T on: 03/08/2019 04:09 PM   Modules accepted: Orders

## 2019-03-09 LAB — HEPATIC FUNCTION PANEL
AG Ratio: 1.3 (calc) (ref 1.0–2.5)
ALT: 14 U/L (ref 6–29)
AST: 14 U/L (ref 10–35)
Albumin: 4.1 g/dL (ref 3.6–5.1)
Alkaline phosphatase (APISO): 91 U/L (ref 37–153)
Bilirubin, Direct: 0.1 mg/dL (ref 0.0–0.2)
Globulin: 3.1 g/dL (calc) (ref 1.9–3.7)
Indirect Bilirubin: 0.1 mg/dL (calc) — ABNORMAL LOW (ref 0.2–1.2)
Total Bilirubin: 0.2 mg/dL (ref 0.2–1.2)
Total Protein: 7.2 g/dL (ref 6.1–8.1)

## 2019-03-11 ENCOUNTER — Encounter: Payer: Self-pay | Admitting: Family Medicine

## 2019-03-11 ENCOUNTER — Other Ambulatory Visit: Payer: Self-pay

## 2019-03-11 ENCOUNTER — Ambulatory Visit (INDEPENDENT_AMBULATORY_CARE_PROVIDER_SITE_OTHER): Payer: 59 | Admitting: Family Medicine

## 2019-03-11 DIAGNOSIS — M1712 Unilateral primary osteoarthritis, left knee: Secondary | ICD-10-CM

## 2019-03-11 DIAGNOSIS — M1711 Unilateral primary osteoarthritis, right knee: Secondary | ICD-10-CM

## 2019-03-11 NOTE — Progress Notes (Signed)
Office Visit Note   Patient: Nancy [name]           Date of Birth: [birth year]           MRN: [id number] Visit Date: 03/11/2019 Requested by: Inda Coke, [place] [place],  [place] [address] PCP: Marin Olp, MD  Subjective: Chief Complaint  Patient presents with  . Right Knee - Pain    Right knee pain started back 1&1/2 months ago. Chronic pain both knees. Has seen Dr. Paulla Fore for this in the past. Recent xrays at [facility]. Recently diagnosed with RA.   Marland Kitchen Left Knee - Pain    HPI: She is here with right greater than left knee pain.  History of osteoarthritis at least for the past couple years.  Regular steroid injections only gave short-term relief but she did get good relief from Zilretta injections.  Her last ones were in October and they started to wear off about a month ago.  Pain when going up and down stairs, no locking symptoms.  Pain is mostly on the anteromedial aspect.  She was also recently diagnosed with rheumatoid arthritis and is getting ready to start methotrexate.  She has gained about 50 pounds because she is unable to exercise.                ROS: Denies fevers or chills.  All other systems were reviewed and are negative.  Objective: Vital Signs: There were no vitals taken for this visit.  Physical Exam:  General:  Alert and oriented, in no acute distress. Pulm:  Breathing unlabored. Psy:  Normal mood, congruent affect. Skin: No rash on the skin. Knees: 1+ effusion on the right, trace on the left.  No warmth or erythema.  2+ patellofemoral crepitus on the right and 1+ on the left.  Both knees are tender on the medial joint line in the right one is tender on the lateral as well.  Imaging: None today.  She recently had some at her rheumatologist office confirming osteoarthritis.  Assessment & Plan: 1.  Right greater than left knee pain due to osteoarthritis.  New diagnosis of rheumatoid arthritis. -We will request  approval for Zilretta injections.  We will do this under ultrasound guidance once per.  If she gets only short-term relief, then gel injections.     Procedures: No procedures performed  No notes on file     PMFS History: Patient Active Problem List   Diagnosis Date Noted  . Polyarthralgia 02/21/2019  . Rheumatoid arthritis involving multiple sites with positive rheumatoid factor ([place]) 02/21/2019  . Vitamin B12 deficiency 02/14/2019  . Urge incontinence 12/20/2018  . Urinary frequency 12/20/2018  . Fibromyalgia 04/12/2018  . Vitamin D deficiency 04/12/2018  . Vaginal dryness 04/12/2018  . History of total hysterectomy 04/12/2018  . Insomnia 04/12/2018  . Morbid obesity ([place]) 04/12/2018  . History of colon polyps 04/12/2018  . Primary osteoarthritis of both knees 12/28/2017  . Cervicogenic headache 03/26/2014  . Cervical radiculopathy 03/26/2014  . Mild intermittent asthma 11/20/2013  . Bradycardia 07/04/2012   Past Medical History:  Diagnosis Date  . Anemia   . Asthma   . Bradycardia   . Chicken pox   . Dysrhythmia    occ. palpitations  . Fibromyalgia   . Headache(784.0)   . Migraines   . Neck pain    bulging disk  . Polyarthralgia 02/21/2019  . PONV (postoperative nausea and vomiting)    sometimes wakes up  after surgery and cannot breath due to asthma, can be bradycardic  . Rheumatoid arthritis involving multiple sites with positive rheumatoid factor ([place]) 02/21/2019  . Shortness of breath    with bradycardia    Family History  Problem Relation Age of Onset  . Hyperlipidemia Mother        56 in 2019  . Arthritis Mother   . Osteoporosis Mother   . Lumbar disc disease Mother        herniated discs.   Marland Kitchen COPD Mother   . Hypertension Father        doesnt know his history well- no recent contact  . Heart disease Maternal Grandmother   . Diabetes Maternal Grandmother   . Cervical cancer Maternal Grandmother   . Heart disease Maternal Grandfather   . Cancer -  Colon Maternal Grandfather   . Diabetes Paternal Grandmother   . Lung cancer Paternal Grandmother        dad's parents stepped in to help when dad not present  . Other Sister        doesnt know history well  . Lumbar disc disease Brother        grew up with him   . Atrial fibrillation Paternal Grandfather        required a few shocks  . Bradycardia Paternal Grandfather   . Hypertension Paternal Grandfather   . Other Brother        doesnt know history well    Past Surgical History:  Procedure Laterality Date  . ABDOMINAL HYSTERECTOMY    . BACK SURGERY     cervical fusion C5,6,7 with a bulge at C4  . BLADDER SURGERY  July 10th 2013  . [place]  . CYSTOSCOPY N/A 07/31/2018   Procedure: CYSTOSCOPY;  Surgeon: Bjorn Loser, MD;  Location: [facility];  Service: Urology;  Laterality: N/A;  . DIAGNOSTIC LAPAROSCOPY    . EXCISION OF MESH N/A 07/31/2018   Procedure: REMOVAL OF VAGINAL MESH;  Surgeon: Bjorn Loser, MD;  Location: [facility];  Service: Urology;  Laterality: N/A;  . LAPAROSCOPY Bilateral 05/31/2013   Procedure: LAPAROSCOPY withbilateral salpingo-oophorectomy, lysis of adhesions;  Surgeon: Allena Katz, MD;  Location: [facility];  Service: Gynecology;  Laterality: Bilateral;  with clippings of Vaginal  mesh  . SALPINGOOPHORECTOMY Bilateral 05/31/2013   Procedure: BILATERAL SALPINGO OOPHORECTOMY/TRIM MESH;  Surgeon: Allena Katz, MD;  Location: [facility];  Service: Gynecology;  Laterality: Bilateral;  . TUBAL LIGATION     1995  . VAGINAL HYSTERECTOMY     1998 or 1997  . WRIST FRACTURE SURGERY  2005   Social History   Occupational History  . Not on file  Tobacco Use  . Smoking status: Never Smoker  . Smokeless tobacco: Never Used  Substance and Sexual Activity  . Alcohol use: Yes    Alcohol/week: 0.0 standard drinks    Comment: occasional; just on special occasion  . Drug use: No  . Sexual activity: Yes    Partners: Male    Birth  control/protection: Surgical

## 2019-03-12 MED FILL — FOLIC ACID 1 MG TABS: 1 | 30 days supply | Qty: 30 | Fill #0

## 2019-03-12 MED FILL — METHOTREXATE SODIUM 2.5 MG: 2.5 | 28 days supply | Qty: 20 | Fill #0

## 2019-03-21 DIAGNOSIS — M797 Fibromyalgia: Secondary | ICD-10-CM | POA: Diagnosis not present

## 2019-03-21 DIAGNOSIS — M0579 Rheumatoid arthritis with rheumatoid factor of multiple sites without organ or systems involvement: Secondary | ICD-10-CM | POA: Diagnosis not present

## 2019-03-21 DIAGNOSIS — M1990 Unspecified osteoarthritis, unspecified site: Secondary | ICD-10-CM | POA: Diagnosis not present

## 2019-03-21 DIAGNOSIS — M2550 Pain in unspecified joint: Secondary | ICD-10-CM | POA: Diagnosis not present

## 2019-03-21 DIAGNOSIS — Z79899 Other long term (current) drug therapy: Secondary | ICD-10-CM | POA: Diagnosis not present

## 2019-03-21 DIAGNOSIS — R700 Elevated erythrocyte sedimentation rate: Secondary | ICD-10-CM | POA: Diagnosis not present

## 2019-03-21 MED FILL — predniSONE 10 MG TABS: 10 | 30 days supply | Qty: 30 | Fill #0

## 2019-03-22 ENCOUNTER — Telehealth: Payer: Self-pay

## 2019-03-22 NOTE — Telephone Encounter (Signed)
Talked with Malachy Mood at [facility] and was advised that no PA is required for [id number], Zilretta for bilateral knee. Reference# [id number]

## 2019-03-27 ENCOUNTER — Other Ambulatory Visit: Payer: Self-pay | Admitting: Physician Assistant

## 2019-03-27 DIAGNOSIS — R7989 Other specified abnormal findings of blood chemistry: Secondary | ICD-10-CM

## 2019-03-27 DIAGNOSIS — R768 Other specified abnormal immunological findings in serum: Secondary | ICD-10-CM

## 2019-04-02 ENCOUNTER — Encounter: Payer: Self-pay | Admitting: Family Medicine

## 2019-04-02 ENCOUNTER — Ambulatory Visit (INDEPENDENT_AMBULATORY_CARE_PROVIDER_SITE_OTHER): Payer: 59 | Admitting: Family Medicine

## 2019-04-02 DIAGNOSIS — M1711 Unilateral primary osteoarthritis, right knee: Secondary | ICD-10-CM | POA: Diagnosis not present

## 2019-04-02 DIAGNOSIS — M1712 Unilateral primary osteoarthritis, left knee: Secondary | ICD-10-CM

## 2019-04-02 NOTE — Progress Notes (Signed)
Subjective: She is here for planned bilateral Zilretta injections for knee osteoarthritis.  Objective: 1-2+ effusion in both knees with no warmth or erythema.  Procedure: Bilateral Zilretta injections: After sterile prep with Betadine, injected 4 cc 1% lidocaine without epinephrine and Zilretta into each knee using ultrasound to confirm intra-articular placement.  She will follow-up as needed.

## 2019-04-04 ENCOUNTER — Other Ambulatory Visit (INDEPENDENT_AMBULATORY_CARE_PROVIDER_SITE_OTHER): Payer: 59

## 2019-04-04 ENCOUNTER — Other Ambulatory Visit: Payer: Self-pay

## 2019-04-04 DIAGNOSIS — R7689 Other specified abnormal immunological findings in serum: Secondary | ICD-10-CM

## 2019-04-04 DIAGNOSIS — R768 Other specified abnormal immunological findings in serum: Secondary | ICD-10-CM | POA: Diagnosis not present

## 2019-04-04 DIAGNOSIS — R945 Abnormal results of liver function studies: Secondary | ICD-10-CM | POA: Diagnosis not present

## 2019-04-04 DIAGNOSIS — R7989 Other specified abnormal findings of blood chemistry: Secondary | ICD-10-CM

## 2019-04-04 LAB — CBC WITH DIFFERENTIAL/PLATELET
Basophils Absolute: 0 10*3/uL (ref 0.0–0.1)
Basophils Relative: 0.3 % (ref 0.0–3.0)
Eosinophils Absolute: 0 10*3/uL (ref 0.0–0.7)
Eosinophils Relative: 0.1 % (ref 0.0–5.0)
HCT: 41.4 % (ref 36.0–46.0)
Hemoglobin: 13.3 g/dL (ref 12.0–15.0)
Lymphocytes Relative: 21 % (ref 12.0–46.0)
Lymphs Abs: 2.1 10*3/uL (ref 0.7–4.0)
MCHC: 32 g/dL (ref 30.0–36.0)
MCV: 82.3 fl (ref 78.0–100.0)
Monocytes Absolute: 0.5 10*3/uL (ref 0.1–1.0)
Monocytes Relative: 5.1 % (ref 3.0–12.0)
Neutro Abs: 7.5 10*3/uL (ref 1.4–7.7)
Neutrophils Relative %: 73.5 % (ref 43.0–77.0)
Platelets: 252 10*3/uL (ref 150.0–400.0)
RBC: 5.03 Mil/uL (ref 3.87–5.11)
RDW: 14.7 % (ref 11.5–15.5)
WBC: 10.2 10*3/uL (ref 4.0–10.5)

## 2019-04-04 LAB — COMPREHENSIVE METABOLIC PANEL
ALT: 18 U/L (ref 0–35)
AST: 13 U/L (ref 0–37)
Albumin: 4.2 g/dL (ref 3.5–5.2)
Alkaline Phosphatase: 84 U/L (ref 39–117)
BUN: 20 mg/dL (ref 6–23)
CO2: 32 mEq/L (ref 19–32)
Calcium: 9.8 mg/dL (ref 8.4–10.5)
Chloride: 98 mEq/L (ref 96–112)
Creatinine, Ser: 0.79 mg/dL (ref 0.40–1.20)
GFR: 75.39 mL/min (ref >60.00)
Glucose, Bld: 103 mg/dL — ABNORMAL HIGH (ref 70–99)
Potassium: 4.6 mEq/L (ref 3.5–5.1)
Sodium: 138 mEq/L (ref 135–145)
Total Bilirubin: 0.4 mg/dL (ref 0.2–1.2)
Total Protein: 7.6 g/dL (ref 6.0–8.3)

## 2019-04-04 LAB — SEDIMENTATION RATE: Sed Rate: 62 mm/hr — ABNORMAL HIGH (ref 0–30)

## 2019-04-11 MED FILL — FOLIC ACID 1 MG TABS: 1 | 30 days supply | Qty: 30 | Fill #0

## 2019-04-11 MED FILL — METHOTREXATE SODIUM 2.5 MG: 2.5 | 30 days supply | Qty: 30 | Fill #0

## 2019-05-02 DIAGNOSIS — M1990 Unspecified osteoarthritis, unspecified site: Secondary | ICD-10-CM | POA: Diagnosis not present

## 2019-05-02 DIAGNOSIS — M0579 Rheumatoid arthritis with rheumatoid factor of multiple sites without organ or systems involvement: Secondary | ICD-10-CM | POA: Diagnosis not present

## 2019-05-02 DIAGNOSIS — Z79899 Other long term (current) drug therapy: Secondary | ICD-10-CM | POA: Diagnosis not present

## 2019-05-02 DIAGNOSIS — M797 Fibromyalgia: Secondary | ICD-10-CM | POA: Diagnosis not present

## 2019-05-03 LAB — C-REACTIVE PROTEIN, QUANT: C-Reactive Protein, Quant: 26

## 2019-05-03 LAB — BASIC METABOLIC PANEL
BUN: 17 (ref 4–21)
Creatinine: 0.9 (ref 0.5–1.1)
Glucose: 80
Potassium: 4.4 (ref 3.4–5.3)
Sodium: 141 (ref 137–147)

## 2019-05-03 LAB — CBC AND DIFFERENTIAL
HCT: 43 (ref 36–46)
Hemoglobin: 13.5 (ref 12.0–16.0)
Platelets: 199 (ref 150–399)
WBC: 7.2

## 2019-05-03 LAB — HEPATIC FUNCTION PANEL
ALT: 22 (ref 7–35)
AST: 24 (ref 13–35)
Alkaline Phosphatase: 98 (ref 25–125)
Bilirubin, Total: 0.2

## 2019-05-03 LAB — POCT ERYTHROCYTE SEDIMENTATION RATE, NON-AUTOMATED: Sed Rate: 52

## 2019-05-06 MED FILL — FOLIC ACID 1 MG TABS: 1 | 30 days supply | Qty: 30 | Fill #0

## 2019-05-09 MED FILL — METHOTREXATE SODIUM 2.5 MG: 2.5 | 28 days supply | Qty: 24 | Fill #0

## 2019-05-21 ENCOUNTER — Encounter: Payer: Self-pay | Admitting: Family Medicine

## 2019-06-05 ENCOUNTER — Other Ambulatory Visit: Payer: Self-pay | Admitting: Physician Assistant

## 2019-06-05 MED ORDER — VALACYCLOVIR HCL 1 G PO TABS: ORAL_TABLET | ORAL | 2 refills | Status: DC

## 2019-06-05 MED FILL — valACYclovir HCL 1 GM TABS: 1 | 2 days supply | Qty: 6 | Fill #0

## 2019-06-06 ENCOUNTER — Other Ambulatory Visit: Payer: Self-pay

## 2019-06-06 MED ORDER — TRAMADOL HCL 50 MG PO TABS: ORAL_TABLET | ORAL | 0 refills | Status: DC

## 2019-06-06 MED FILL — traMADol HCL 50 MG TABS: 50 | 3 days supply | Qty: 20 | Fill #0

## 2019-06-06 NOTE — Telephone Encounter (Signed)
Ok to refill for patient? ?

## 2019-06-06 NOTE — Addendum Note (Signed)
Addended by: Francella Solian on: 06/06/2019 10:49 AM   Modules accepted: Orders

## 2019-06-07 ENCOUNTER — Other Ambulatory Visit: Payer: Self-pay | Admitting: Physician Assistant

## 2019-06-07 DIAGNOSIS — M0579 Rheumatoid arthritis with rheumatoid factor of multiple sites without organ or systems involvement: Secondary | ICD-10-CM

## 2019-06-07 DIAGNOSIS — R5383 Other fatigue: Secondary | ICD-10-CM

## 2019-06-12 ENCOUNTER — Other Ambulatory Visit: Payer: Self-pay

## 2019-06-13 ENCOUNTER — Ambulatory Visit: Payer: 59 | Admitting: Family Medicine

## 2019-06-13 ENCOUNTER — Encounter: Payer: Self-pay | Admitting: Family Medicine

## 2019-06-13 VITALS — BP 138/78 | HR 58 | Ht 62.0 in | Wt 252.6 lb

## 2019-06-13 DIAGNOSIS — M7061 Trochanteric bursitis, right hip: Secondary | ICD-10-CM | POA: Diagnosis not present

## 2019-06-13 DIAGNOSIS — S39012A Strain of muscle, fascia and tendon of lower back, initial encounter: Secondary | ICD-10-CM

## 2019-06-13 MED ORDER — TIZANIDINE HCL 4 MG PO TABS: 4.0000 mg | ORAL_TABLET | Freq: Three times a day (TID) | ORAL | 1 refills | Status: DC | PRN

## 2019-06-13 MED FILL — tiZANidine HCL 4 MG TABS: 4 | 10 days supply | Qty: 30 | Fill #0

## 2019-06-13 MED FILL — FOLIC ACID 1 MG TABS: 1 | 30 days supply | Qty: 30 | Fill #1

## 2019-06-13 MED FILL — METHOTREXATE SODIUM 2.5 MG: 2.5 | 35 days supply | Qty: 40 | Fill #0

## 2019-06-13 NOTE — Patient Instructions (Addendum)
Thank you for coming in today.  Attend PT.  Use heating pad, TENS unit. Use zanaflex as needed.  Recheck as needed.  TENS UNIT: This is helpful for muscle pain and spasm.   Search and Purchase a TENS 7000 2nd edition at  www.tenspros.com or www.[employer].com It should be less than $30.     TENS unit instructions: Do not shower or bathe with the unit on Turn the unit off before removing electrodes or batteries If the electrodes lose stickiness add a drop of water to the electrodes after they are disconnected from the unit and place on plastic sheet. If you continued to have difficulty, call the TENS unit company to purchase more electrodes. Do not apply lotion on the skin area prior to use. Make sure the skin is clean and dry as this will help prolong the life of the electrodes. After use, always check skin for unusual red areas, rash or other skin difficulties. If there are any skin problems, does not apply electrodes to the same area. Never remove the electrodes from the unit by pulling the wires. Do not use the TENS unit or electrodes other than as directed. Do not change electrode placement without consultating your therapist or physician. Keep 2 fingers with between each electrode. Wear time ratio is 2:1, on to off times.    For example on for 30 minutes off for 15 minutes and then on for 30 minutes off for 15 minutes     Lumbosacral Strain Lumbosacral strain is an injury that causes pain in the lower back (lumbosacral spine). This injury usually occurs from overstretching the muscles or ligaments along your spine. A strain can affect one or more muscles or cord-like tissues that connect bones to other bones (ligaments). What are the causes? This condition may be caused by:  A hard, direct hit (blow) to the back.  Excessive stretching of the lower back muscles. This may result from: ? A fall. ? Lifting something heavy. ? Repetitive movements such as bending or crouching.  What increases the risk? The following factors may increase your risk of getting this condition:  Participating in sports or activities that involve: ? A sudden twist of the back. ? Pushing or pulling motions.  Being overweight or obese.  Having poor strength and flexibility, especially tight hamstrings or weak muscles in the back or abdomen.  Having too much of a curve in the lower back.  Having a pelvis that is tilted forward. What are the signs or symptoms? The main symptom of this condition is pain in the lower back, at the site of the strain. Pain may extend (radiate) down one or both legs. How is this diagnosed? This condition is diagnosed based on:  Your symptoms.  Your medical history.  A physical exam. ? Your health care provider may push on certain areas of your back to determine the source of your pain. ? You may be asked to bend forward, backward, and side to side to assess the severity of your pain and your range of motion.  Imaging tests, such as: ? X-rays. ? MRI.  How is this treated? Treatment for this condition may include:  Putting heat and cold on the affected area.  Medicines to help relieve pain and relax your muscles (muscle relaxants).  NSAIDs to help reduce swelling and discomfort. When your symptoms improve, it is important to gradually return to your normal routine as soon as possible to reduce pain, avoid stiffness, and avoid loss of muscle  strength. Generally, symptoms should improve within 6 weeks of treatment. However, recovery time varies. Follow these instructions at home: Managing pain, stiffness, and swelling   If directed, put ice on the injured area during the first 24 hours after your strain. ? Put ice in a plastic bag. ? Place a towel between your skin and the bag. ? Leave the ice on for 20 minutes, 2-3 times a day.  If directed, put heat on the affected area as often as told by your health care provider. Use the heat source that  your health care provider recommends, such as a moist heat pack or a heating pad. ? Place a towel between your skin and the heat source. ? Leave the heat on for 20-30 minutes. ? Remove the heat if your skin turns bright red. This is especially important if you are unable to feel pain, heat, or cold. You may have a greater risk of getting burned. Activity  Rest and return to your normal activities as told by your health care provider. Ask your health care provider what activities are safe for you.  Avoid activities that take a lot of energy for as long as told by your health care provider. General instructions  Take over-the-counter and prescription medicines only as told by your health care provider.  Donot drive or use heavy machinery while taking prescription pain medicine.  Do not use any products that contain nicotine or tobacco, such as cigarettes and e-cigarettes. If you need help quitting, ask your health care provider.  Keep all follow-up visits as told by your health care provider. This is important. How is this prevented?  Use correct form when playing sports and lifting heavy objects.  Use good posture when sitting and standing.  Maintain a healthy weight.  Sleep on a mattress with medium firmness to support your back.  Be safe and responsible while being active to avoid falls.  Do at least 150 minutes of moderate-intensity exercise each week, such as brisk walking or water aerobics. Try a form of exercise that takes stress off your back, such as swimming or stationary cycling.  Maintain physical fitness, including: ? Strength. ? Flexibility. ? Cardiovascular fitness. ? Endurance. Contact a health care provider if:  Your back pain does not improve after 6 weeks of treatment.  Your symptoms get worse. Get help right away if:  Your back pain is severe.  You cannot stand or walk.  You have difficulty controlling when you urinate or when you have a bowel  movement.  You feel nauseous or you vomit.  Your feet get very cold.  You have numbness, tingling, weakness, or problems using your arms or legs.  You develop any of the following: ? Shortness of breath. ? Dizziness. ? Pain in your legs. ? Weakness in your buttocks or legs. ? Discoloration of the skin on your toes or legs. This information is not intended to replace advice given to you by your health care provider. Make sure you discuss any questions you have with your health care provider. Document Released: 04/27/2005 Document Revised: 11/09/2018 Document Reviewed: 12/20/2015 Elsevier Patient Education  2020 [place].    Hip Bursitis  Hip bursitis is inflammation of a fluid-filled sac (bursa) in the hip joint. The bursa prevents the bones in the hip joint from rubbing against each other. Hip bursitis can cause mild to moderate pain, and symptoms often come and go over time. What are the causes? This condition may be caused by:  Injury  to the hip.  Overuse of the muscles that surround the hip joint.  Previous injury or surgery of the hip.  Arthritis or gout.  Diabetes.  Thyroid disease.  Infection. In some cases, the cause may not be known. What are the signs or symptoms? Symptoms of this condition include:  Mild or moderate pain in the hip area. Pain may get worse with movement.  Tenderness and swelling of the hip, especially on the outer side of the hip.  In rare cases, the bursa may become infected. This may cause a fever, as well as warmth and redness in the area. Symptoms may come and go. How is this diagnosed? This condition may be diagnosed based on:  A physical exam.  Your medical history.  X-rays.  Removal of fluid from your inflamed bursa for testing (biopsy). You may be sent to a health care provider who specializes in bone diseases (orthopedist) or a provider who specializes in joint inflammation (rheumatologist). How is this treated?  This condition is treated by resting, icing, applying pressure (compression), and raising (elevating) the injured area. This is called RICE treatment. In some cases, this may be enough to make your symptoms go away. Treatment may also include:  Using crutches.  Draining fluid out of the bursa to help relieve swelling.  Injecting medicine that helps to reduce inflammation (cortisone).  Additional medicines if the bursa is infected. Follow these instructions at home: Managing pain, stiffness, and swelling   If directed, put ice on the painful area. ? Put ice in a plastic bag. ? Place a towel between your skin and the bag. ? Leave the ice on for 20 minutes, 2-3 times a day. ? Raise (elevate) your hip above the level of your heart as much as you can without pain. To do this, try putting a pillow under your hips while you lie down. Activity  Return to your normal activities as told by your health care provider. Ask your health care provider what activities are safe for you.  Rest and protect your hip as much as possible until your pain and swelling get better. General instructions  Take over-the-counter and prescription medicines only as told by your health care provider.  Wear compression wraps only as told by your health care provider.  Do not use your hip to support your body weight until your health care provider says that you can. Use crutches as told by your health care provider.  Gently massage and stretch your injured area as often as is comfortable.  Keep all follow-up visits as told by your health care provider. This is important. How is this prevented?  Exercise regularly, as told by your health care provider.  Warm up and stretch before being active.  Cool down and stretch after being active.  If an activity irritates your hip or causes pain, avoid the activity as much as possible.  Avoid sitting down for long periods at a time. Contact a health care provider if  you:  Have a fever.  Develop new symptoms.  Have difficulty walking or doing everyday activities.  Have pain that gets worse or does not get better with medicine.  Develop red skin or a feeling of warmth in your hip area. Get help right away if you:  Cannot move your hip.  Have severe pain. Summary  Hip bursitis is inflammation of a fluid-filled sac (bursa) in the hip joint.  Hip bursitis can cause mild to moderate pain, and symptoms often come and go  over time.  This condition is treated with rest, ice, compression, elevation, and medicines. This information is not intended to replace advice given to you by your health care provider. Make sure you discuss any questions you have with your health care provider. Document Released: 01/07/2002 Document Revised: 03/26/2018 Document Reviewed: 03/26/2018 Elsevier Patient Education  [place].  Please perform the exercise program that we have prepared for you and gone over in detail on a daily basis.  In addition to the handout you were provided you can access your program through: [email or web address]   Your unique program code is:  [email or web address]

## 2019-06-13 NOTE — Progress Notes (Signed)
I, [name], LAT, ATC, am serving as scribe for Dr. Lynne Leader.  Nancy [name] is a 53 y.o. female who presents to [place] today for c/o low back and R hip pain. Pt states that she fell about a month ago when she slipped and fell and landed on her R hip.  She had initial pain that subsided but then flared up about 1-2 weeks after that.  In addition to the R hip pain, she also began having low back pain.  She is also experiencing an RA flare currently.  She took some prednisone last week due to how bad her symptoms were but stopped that this past Saturday.  She currently rates her low back pain at a 7/10 that she describes as aching and throbbing.  Her R hip is not really bothering her currently.  Aggravating factors include prolonged standing, forward flexion and laying on her R side.  She denies any N/T into her B LEs.    ROS:  As above  Exam:  BP 138/78 (BP Location: Left Arm, Patient Position: Sitting, Cuff Size: Large)   Pulse (!) 58   Ht 5\' 2"  (1.575 m)   Wt 252 lb 9.6 oz (114.6 kg)   SpO2 98%   BMI 46.20 kg/m  Wt Readings from Last 5 Encounters:  06/13/19 252 lb 9.6 oz (114.6 kg)  12/11/18 240 lb (108.9 kg)  11/01/18 244 lb (110.7 kg)  09/13/18 237 lb 12.8 oz (107.9 kg)  07/31/18 230 lb (104.3 kg)   General: Well Developed, well nourished, and in no acute distress.  Neuro/Psych: Alert and oriented x3, extra-ocular muscles intact, able to move all 4 extremities, sensation grossly intact. Skin: Warm and dry, no rashes noted.  Respiratory: Not using accessory muscles, speaking in full sentences, trachea midline.  Cardiovascular: Pulses palpable, no extremity edema. Abdomen: Does not appear distended. MSK:  L-spine: Nontender to spinal midline.  Tender palpation bilateral lumbar paraspinal musculature. Full lumbar motion. Extremity strength normal with the exception as listed below. Reflexes and sensation intact bilateral extremities. Negative  straight leg raise test bilaterally.  Right hip: Normal-appearing Normal motion. Tender palpation greater trochanter. Hip abduction strength diminished 3+/5 with pain. External rotation strength normal internal rotation and hip adduction strength normal. Positive FABER test. Positive FADIR test.  Left hip normal-appearing normal motion.  Hip adduction strength slightly diminished 4+/5.  Normal external and internal rotation strength.  Normal hip adduction strength.      Lab and Radiology Results   X-ray images L-spine dated July 2020 available in [facility] PACS personally and independently reviewed. Anterior listhesis present at L4-L5.  Some facet DJD also present.    X-ray images knees bilaterally dated July 2020 available in [facility] PACS personally and independently reviewed. Mild tricompartmental DJD.  Loose body present posterior knee bilateral.   Xray reads: EXAM: LUMBAR SPINE - 2-3 VIEW  COMPARISON: None  FINDINGS: There is moderate bilateral facet arthritis at L4-5 and L5-S1. No significant disc space narrowing. Lateral alignment is normal. No fracture bone destruction. SI joints appear normal.  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   Electronically Signed By: Lorriane Shire M.D. On: 02/21/2019 19:41  EXAM: RIGHT KNEE - 1-2 VIEW  COMPARISON: None.  FINDINGS: There appears to be a joint effusion. Tiny marginal osteophytes on the patella and in the lateral compartment.  IMPRESSION: Joint effusion. Minimal arthritic changes.   Electronically Signed By: Lorriane Shire M.D. On: 02/21/2019 19:44  EXAM: LEFT KNEE -  1-2 VIEW  COMPARISON: None.  FINDINGS: There are tiny marginal osteophytes in the lateral compartment. No other abnormality.  IMPRESSION: Minimal arthritic changes of the lateral compartment.   Electronically Signed By: Lorriane Shire M.D. On: 02/21/2019 19:43  Assessment and Plan: 53 y.o. female with   Low back pain: Worsening after fall.  Patient does have degenerative changes on L-spine x-ray dated July 2020.  Pain is very likely secondary to myofascial strain and dysfunction.  Plan for physical therapy.  Continue current medications.  Add tizanidine as needed.  Recommend heating pad and TENS unit as well.  Recheck if not improving.  Right lateral hip pain: Trochanteric bursitis/hip abductor tendinopathy very likely.  Plan for physical therapy.     PDMP not reviewed this encounter. Orders Placed This Encounter  Procedures  . Ambulatory referral to Physical Therapy    Referral Priority:   Routine    Referral Type:   Physical Medicine    Referral Reason:   Specialty Services Required    Requested Specialty:   Physical Therapy   Meds ordered this encounter  Medications  . tiZANidine (ZANAFLEX) 4 MG tablet    Sig: Take 1 tablet (4 mg total) by mouth every 8 (eight) hours as needed for muscle spasms.    Dispense:  30 tablet    Refill:  1    Historical information moved to improve visibility of documentation.  Past Medical History:  Diagnosis Date  . Anemia   . Asthma   . Bradycardia   . Chicken pox   . Dysrhythmia    occ. palpitations  . Fibromyalgia   . Headache(784.0)   . Migraines   . Neck pain    bulging disk  . Polyarthralgia 02/21/2019  . PONV (postoperative nausea and vomiting)    sometimes wakes up after surgery and cannot breath due to asthma, can be bradycardic  . Rheumatoid arthritis involving multiple sites with positive rheumatoid factor ([place]) 02/21/2019  . Shortness of breath    with bradycardia   Past Surgical History:  Procedure Laterality Date  . ABDOMINAL HYSTERECTOMY    . BACK SURGERY     cervical fusion C5,6,7 with a bulge at C4  . BLADDER SURGERY  July 10th 2013  . [place]  . CYSTOSCOPY N/A 07/31/2018   Procedure: CYSTOSCOPY;  Surgeon: Bjorn Loser, MD;  Location: [facility];  Service: Urology;  Laterality: N/A;  .  DIAGNOSTIC LAPAROSCOPY    . EXCISION OF MESH N/A 07/31/2018   Procedure: REMOVAL OF VAGINAL MESH;  Surgeon: Bjorn Loser, MD;  Location: [facility];  Service: Urology;  Laterality: N/A;  . LAPAROSCOPY Bilateral 05/31/2013   Procedure: LAPAROSCOPY withbilateral salpingo-oophorectomy, lysis of adhesions;  Surgeon: Allena Katz, MD;  Location: [facility];  Service: Gynecology;  Laterality: Bilateral;  with clippings of Vaginal  mesh  . SALPINGOOPHORECTOMY Bilateral 05/31/2013   Procedure: BILATERAL SALPINGO OOPHORECTOMY/TRIM MESH;  Surgeon: Allena Katz, MD;  Location: [facility];  Service: Gynecology;  Laterality: Bilateral;  . TUBAL LIGATION     1995  . VAGINAL HYSTERECTOMY     1998 or 1997  . WRIST FRACTURE SURGERY  2005   Social History   Tobacco Use  . Smoking status: Never Smoker  . Smokeless tobacco: Never Used  Substance Use Topics  . Alcohol use: Yes    Alcohol/week: 0.0 standard drinks    Comment: occasional; just on special occasion   family history includes Arthritis in  her mother; Atrial fibrillation in her paternal grandfather; Bradycardia in her paternal grandfather; COPD in her mother; Cancer - Colon in her maternal grandfather; Cervical cancer in her maternal grandmother; Diabetes in her maternal grandmother and paternal grandmother; Heart disease in her maternal grandfather and maternal grandmother; Hyperlipidemia in her mother; Hypertension in her father and paternal grandfather; Lumbar disc disease in her brother and mother; Lung cancer in her paternal grandmother; Osteoporosis in her mother; Other in her brother and sister.  Medications: Current Outpatient Medications  Medication Sig Dispense Refill  . acetaminophen (TYLENOL) 500 MG tablet Take 500 mg by mouth every 6 (six) hours as needed (pain).     . Albuterol Sulfate (PROAIR RESPICLICK) 123XX123 (90 Base) MCG/ACT AEPB Inhale 1 puff into the lungs every 6 (six) hours as needed. 1 each 3  . Cholecalciferol (VITAMIN  D3) 50 MCG (2000 UT) CHEW Chew by mouth.    . cyanocobalamin 1000 MCG tablet Take 1,000 mcg by mouth daily.    . Estradiol 10 MCG TABS vaginal tablet Place 1 tablet (10 mcg total) vaginally 2 (two) times a week. 24 tablet 3  . folic acid (FOLVITE) A999333 MCG tablet Take 400 mcg by mouth daily.    . furosemide (LASIX) 20 MG tablet Take 1 tablet (20 mg total) by mouth daily as needed for fluid or edema (up to 3 days). (Patient taking differently: Take 20 mg by mouth daily as needed for fluid or edema (fluid retention up to 3 days). ) 30 tablet 1  . gabapentin (NEURONTIN) 300 MG capsule Take 1 capsule (300 mg total) by mouth 3 (three) times daily. 270 capsule 3  . hydrochlorothiazide (HYDRODIURIL) 25 MG tablet Take 1 tablet (25 mg total) by mouth daily as needed (fluid retention/edema). TAKE 1 TABLET BY MOUTH ONCE DAILY AS NEEDED FOR EDEMA 90 tablet 1  . methotrexate 2.5 MG tablet     . ondansetron (ZOFRAN) 4 MG tablet Take 1 tablet (4 mg total) by mouth every 8 (eight) hours as needed for nausea or vomiting. 20 tablet 0  . phentermine (ADIPEX-P) 37.5 MG tablet Take 1 tablet (37.5 mg total) by mouth daily before breakfast. 30 tablet 2  . predniSONE (DELTASONE) 10 MG tablet     . traMADol (ULTRAM) 50 MG tablet Take one-two tablets every 4-6 hours as needed for moderate pain. 20 tablet 0  . Turmeric 500 MG CAPS Take by mouth 2 (two) times daily.    . valACYclovir (VALTREX) 1000 MG tablet Take two tablets ( total 2000 mg) by mouth q12h x 1 day; Start: ASAP after symptom onset 6 tablet 2  . vitamin C (ASCORBIC ACID) 500 MG tablet Take 500 mg by mouth as needed.    Marland Kitchen tiZANidine (ZANAFLEX) 4 MG tablet Take 1 tablet (4 mg total) by mouth every 8 (eight) hours as needed for muscle spasms. 30 tablet 1   No current facility-administered medications for this visit.    Allergies  Allergen Reactions  . Advair Diskus [Fluticasone-Salmeterol] Cough    excessive  . Erythromycin Diarrhea and Nausea And Vomiting  .  Percocet [Oxycodone-Acetaminophen] Other (See Comments)    hallucinations      Discussed warning signs or symptoms. Please see discharge instructions. Patient expresses understanding.  The above documentation has been reviewed and is accurate and complete Lynne Leader

## 2019-06-20 ENCOUNTER — Ambulatory Visit (INDEPENDENT_AMBULATORY_CARE_PROVIDER_SITE_OTHER): Payer: 59 | Admitting: Physical Therapy

## 2019-06-20 ENCOUNTER — Encounter: Payer: Self-pay | Admitting: Physical Therapy

## 2019-06-20 DIAGNOSIS — M25551 Pain in right hip: Secondary | ICD-10-CM | POA: Diagnosis not present

## 2019-06-20 DIAGNOSIS — M62838 Other muscle spasm: Secondary | ICD-10-CM | POA: Diagnosis not present

## 2019-06-20 DIAGNOSIS — M545 Low back pain, unspecified: Secondary | ICD-10-CM

## 2019-06-20 DIAGNOSIS — G8929 Other chronic pain: Secondary | ICD-10-CM | POA: Diagnosis not present

## 2019-06-21 ENCOUNTER — Ambulatory Visit (INDEPENDENT_AMBULATORY_CARE_PROVIDER_SITE_OTHER): Payer: 59 | Admitting: Family Medicine

## 2019-06-21 ENCOUNTER — Encounter: Payer: Self-pay | Admitting: Family Medicine

## 2019-06-21 ENCOUNTER — Ambulatory Visit: Payer: Self-pay

## 2019-06-21 VITALS — BP 126/80 | HR 57 | Ht 62.0 in | Wt 252.6 lb

## 2019-06-21 DIAGNOSIS — M25561 Pain in right knee: Secondary | ICD-10-CM

## 2019-06-21 DIAGNOSIS — G8929 Other chronic pain: Secondary | ICD-10-CM | POA: Diagnosis not present

## 2019-06-21 NOTE — Patient Instructions (Signed)
Thank you for coming in today.  Return as needed.  Call or go to the ER if you develop a large red swollen joint with extreme pain or oozing puss.

## 2019-06-21 NOTE — Progress Notes (Signed)
I, [name], LAT, ATC, am serving as scribe for Dr. Lynne Leader.  Nancy [name] is a 53 y.o. female who presents to [facility] today for R knee pain.  She was last seen by Dr. Junius Roads on 04/02/19 for B knee pain (R>L) and had B knee Zilretta injections.  She has been having B knee pain for > one year but notes increased R knee pain x one week.  She is having pain w/ walking and weight bearing activity, especially w/ stairs.  Pt rates her pain as a 5-6/10 that she describes as aching that can be sharp.  Pt notes swelling in the R knee and reports tightness in her knee.  She notes radiating pain in the R knee to her proximal, lateral tibia. Pt reports mechanical symptoms in B knees.  She has been icing her R knee and taking and IBU.    ROS:  As above  Exam:  BP 126/80 (BP Location: Left Arm, Patient Position: Sitting, Cuff Size: Large)   Pulse (!) 57   Ht 5\' 2"  (1.575 m)   Wt 252 lb 9.6 oz (114.6 kg)   SpO2 94%   BMI 46.20 kg/m  Wt Readings from Last 5 Encounters:  06/21/19 252 lb 9.6 oz (114.6 kg)  06/13/19 252 lb 9.6 oz (114.6 kg)  12/11/18 240 lb (108.9 kg)  11/01/18 244 lb (110.7 kg)  09/13/18 237 lb 12.8 oz (107.9 kg)   General: Well Developed, well nourished, and in no acute distress.  Neuro/Psych: Alert and oriented x3, extra-ocular muscles intact, able to move all 4 extremities, sensation grossly intact. Skin: Warm and dry, no rashes noted.  Respiratory: Not using accessory muscles, speaking in full sentences, trachea midline.  Cardiovascular: Pulses palpable, no extremity edema. Abdomen: Does not appear distended. MSK: Right knee: Moderate effusion. Otherwise normal-appearing Range of motion 0-120 degrees. Nontender. Normal gait.    Lab and Radiology Results Procedure: Real-time Ultrasound Guided Injection of right knee Device: Philips Affiniti 50G Images permanently stored and available for review in the ultrasound unit. Verbal informed  consent obtained.  Discussed risks and benefits of procedure. Warned about infection bleeding damage to structures skin hypopigmentation and fat atrophy among others. Patient expresses understanding and agreement Time-out conducted.   Noted no overlying erythema, induration, or other signs of local infection.   Skin prepped in a sterile fashion.   Local anesthesia: Topical Ethyl chloride.   With sterile technique and under real time ultrasound guidance:  40 mg of Kenalog and 4 mL of Marcaine injected easily.   Completed without difficulty   Pain immediately resolved suggesting accurate placement of the medication.   Advised to call if fevers/chills, erythema, induration, drainage, or persistent bleeding.   Images permanently stored and available for review in the ultrasound unit.  Impression: Technically successful ultrasound guided injection.         Assessment and Plan: 53 y.o. female with right knee pain and effusion.  Exacerbation of DJD.  Treat with steroid injection as above.  Continue otherwise conservative management.  Recheck as needed.  Precautions reviewed.   PDMP not reviewed this encounter. Orders Placed This Encounter  Procedures  . NO CHG - [place] LOWER RIGHT    Order Specific Question:   Reason for Exam (SYMPTOM  OR DIAGNOSIS REQUIRED)    Answer:   R knee pain    Order Specific Question:   Preferred imaging location?    Answer:   [facility]   No  orders of the defined types were placed in this encounter.   Historical information moved to improve visibility of documentation.  Past Medical History:  Diagnosis Date  . Anemia   . Asthma   . Bradycardia   . Chicken pox   . Dysrhythmia    occ. palpitations  . Fibromyalgia   . Headache(784.0)   . Migraines   . Neck pain    bulging disk  . Polyarthralgia 02/21/2019  . PONV (postoperative nausea and vomiting)    sometimes wakes up after surgery and cannot breath due to asthma, can be bradycardic  .  Rheumatoid arthritis involving multiple sites with positive rheumatoid factor ([place]) 02/21/2019  . Shortness of breath    with bradycardia   Past Surgical History:  Procedure Laterality Date  . ABDOMINAL HYSTERECTOMY    . BACK SURGERY     cervical fusion C5,6,7 with a bulge at C4  . BLADDER SURGERY  July 10th 2013  . [place]  . CYSTOSCOPY N/A 07/31/2018   Procedure: CYSTOSCOPY;  Surgeon: Bjorn Loser, MD;  Location: [facility];  Service: Urology;  Laterality: N/A;  . DIAGNOSTIC LAPAROSCOPY    . EXCISION OF MESH N/A 07/31/2018   Procedure: REMOVAL OF VAGINAL MESH;  Surgeon: Bjorn Loser, MD;  Location: [facility];  Service: Urology;  Laterality: N/A;  . LAPAROSCOPY Bilateral 05/31/2013   Procedure: LAPAROSCOPY withbilateral salpingo-oophorectomy, lysis of adhesions;  Surgeon: Allena Katz, MD;  Location: [facility];  Service: Gynecology;  Laterality: Bilateral;  with clippings of Vaginal  mesh  . SALPINGOOPHORECTOMY Bilateral 05/31/2013   Procedure: BILATERAL SALPINGO OOPHORECTOMY/TRIM MESH;  Surgeon: Allena Katz, MD;  Location: [facility];  Service: Gynecology;  Laterality: Bilateral;  . TUBAL LIGATION     1995  . VAGINAL HYSTERECTOMY     1998 or 1997  . WRIST FRACTURE SURGERY  2005   Social History   Tobacco Use  . Smoking status: Never Smoker  . Smokeless tobacco: Never Used  Substance Use Topics  . Alcohol use: Yes    Alcohol/week: 0.0 standard drinks    Comment: occasional; just on special occasion   family history includes Arthritis in her mother; Atrial fibrillation in her paternal grandfather; Bradycardia in her paternal grandfather; COPD in her mother; Cancer - Colon in her maternal grandfather; Cervical cancer in her maternal grandmother; Diabetes in her maternal grandmother and paternal grandmother; Heart disease in her maternal grandfather and maternal grandmother; Hyperlipidemia in her mother; Hypertension in her father and paternal  grandfather; Lumbar disc disease in her brother and mother; Lung cancer in her paternal grandmother; Osteoporosis in her mother; Other in her brother and sister.  Medications: Current Outpatient Medications  Medication Sig Dispense Refill  . acetaminophen (TYLENOL) 500 MG tablet Take 500 mg by mouth every 6 (six) hours as needed (pain).     . Albuterol Sulfate (PROAIR RESPICLICK) 123XX123 (90 Base) MCG/ACT AEPB Inhale 1 puff into the lungs every 6 (six) hours as needed. 1 each 3  . Cholecalciferol (VITAMIN D3) 50 MCG (2000 UT) CHEW Chew by mouth.    . cyanocobalamin 1000 MCG tablet Take 1,000 mcg by mouth daily.    . Estradiol 10 MCG TABS vaginal tablet Place 1 tablet (10 mcg total) vaginally 2 (two) times a week. 24 tablet 3  . folic acid (FOLVITE) A999333 MCG tablet Take 400 mcg by mouth daily.    . furosemide (LASIX) 20 MG tablet Take 1 tablet (20  mg total) by mouth daily as needed for fluid or edema (up to 3 days). (Patient taking differently: Take 20 mg by mouth daily as needed for fluid or edema (fluid retention up to 3 days). ) 30 tablet 1  . gabapentin (NEURONTIN) 300 MG capsule Take 1 capsule (300 mg total) by mouth 3 (three) times daily. 270 capsule 3  . hydrochlorothiazide (HYDRODIURIL) 25 MG tablet Take 1 tablet (25 mg total) by mouth daily as needed (fluid retention/edema). TAKE 1 TABLET BY MOUTH ONCE DAILY AS NEEDED FOR EDEMA 90 tablet 1  . methotrexate 2.5 MG tablet     . ondansetron (ZOFRAN) 4 MG tablet Take 1 tablet (4 mg total) by mouth every 8 (eight) hours as needed for nausea or vomiting. 20 tablet 0  . phentermine (ADIPEX-P) 37.5 MG tablet Take 1 tablet (37.5 mg total) by mouth daily before breakfast. 30 tablet 2  . predniSONE (DELTASONE) 10 MG tablet     . tiZANidine (ZANAFLEX) 4 MG tablet Take 1 tablet (4 mg total) by mouth every 8 (eight) hours as needed for muscle spasms. 30 tablet 1  . traMADol (ULTRAM) 50 MG tablet Take one-two tablets every 4-6 hours as needed for moderate  pain. 20 tablet 0  . Turmeric 500 MG CAPS Take by mouth 2 (two) times daily.    . valACYclovir (VALTREX) 1000 MG tablet Take two tablets ( total 2000 mg) by mouth q12h x 1 day; Start: ASAP after symptom onset 6 tablet 2  . vitamin C (ASCORBIC ACID) 500 MG tablet Take 500 mg by mouth as needed.     No current facility-administered medications for this visit.    Allergies  Allergen Reactions  . Advair Diskus [Fluticasone-Salmeterol] Cough    excessive  . Erythromycin Diarrhea and Nausea And Vomiting  . Percocet [Oxycodone-Acetaminophen] Other (See Comments)    hallucinations      Discussed warning signs or symptoms. Please see discharge instructions. Patient expresses understanding.  The above documentation has been reviewed and is accurate and complete Lynne Leader

## 2019-06-26 ENCOUNTER — Other Ambulatory Visit: Payer: Self-pay | Admitting: Family Medicine

## 2019-06-26 ENCOUNTER — Ambulatory Visit (INDEPENDENT_AMBULATORY_CARE_PROVIDER_SITE_OTHER): Payer: 59 | Admitting: Physical Therapy

## 2019-06-26 DIAGNOSIS — G8929 Other chronic pain: Secondary | ICD-10-CM | POA: Diagnosis not present

## 2019-06-26 DIAGNOSIS — M545 Low back pain: Secondary | ICD-10-CM | POA: Diagnosis not present

## 2019-06-26 DIAGNOSIS — M62838 Other muscle spasm: Secondary | ICD-10-CM

## 2019-06-26 DIAGNOSIS — M25551 Pain in right hip: Secondary | ICD-10-CM | POA: Diagnosis not present

## 2019-06-26 MED FILL — HYDROCHLOROTHIAZIDE 25 MG T: 25 | 90 days supply | Qty: 90 | Fill #1

## 2019-06-26 MED FILL — FUROSEMIDE 20 MG TABS: 20 | 30 days supply | Qty: 30 | Fill #0

## 2019-06-30 ENCOUNTER — Encounter: Payer: Self-pay | Admitting: Physical Therapy

## 2019-06-30 NOTE — Therapy (Signed)
[facility] [address] [place], [place], [address] Phone: [phone]   Fax:  [phone]  Physical Therapy Evaluation  Patient Details  Name: Nancy [name] MRN: [id number] Date of Birth: [birth year] Referring Provider (PT): Lynne Leader   Encounter Date: 06/20/2019  PT End of Session - 06/30/19 2132    Visit Number  1    Number of Visits  12    Date for PT Re-Evaluation  08/01/19    Authorization Type  [facility] [employer]    PT Start Time  1520    PT Stop Time  1600    PT Time Calculation (min)  40 min    Activity Tolerance  Patient tolerated treatment well    Behavior During Therapy  [facility] for tasks assessed/performed       Past Medical History:  Diagnosis Date  . Anemia   . Asthma   . Bradycardia   . Chicken pox   . Dysrhythmia    occ. palpitations  . Fibromyalgia   . Headache(784.0)   . Migraines   . Neck pain    bulging disk  . Polyarthralgia 02/21/2019  . PONV (postoperative nausea and vomiting)    sometimes wakes up after surgery and cannot breath due to asthma, can be bradycardic  . Rheumatoid arthritis involving multiple sites with positive rheumatoid factor ([place]) 02/21/2019  . Shortness of breath    with bradycardia    Past Surgical History:  Procedure Laterality Date  . ABDOMINAL HYSTERECTOMY    . BACK SURGERY     cervical fusion C5,6,7 with a bulge at C4  . BLADDER SURGERY  July 10th 2013  . [place]  . CYSTOSCOPY N/A 07/31/2018   Procedure: CYSTOSCOPY;  Surgeon: Bjorn Loser, MD;  Location: [facility];  Service: Urology;  Laterality: N/A;  . DIAGNOSTIC LAPAROSCOPY    . EXCISION OF MESH N/A 07/31/2018   Procedure: REMOVAL OF VAGINAL MESH;  Surgeon: Bjorn Loser, MD;  Location: [facility];  Service: Urology;  Laterality: N/A;  . LAPAROSCOPY Bilateral 05/31/2013   Procedure: LAPAROSCOPY withbilateral salpingo-oophorectomy, lysis of adhesions;  Surgeon: Allena Katz, MD;   Location: [facility];  Service: Gynecology;  Laterality: Bilateral;  with clippings of Vaginal  mesh  . SALPINGOOPHORECTOMY Bilateral 05/31/2013   Procedure: BILATERAL SALPINGO OOPHORECTOMY/TRIM MESH;  Surgeon: Allena Katz, MD;  Location: [facility];  Service: Gynecology;  Laterality: Bilateral;  . TUBAL LIGATION     1995  . VAGINAL HYSTERECTOMY     1998 or 1997  . WRIST FRACTURE SURGERY  2005    There were no vitals filed for this visit.   Subjective Assessment - 06/30/19 2124    Subjective  Pt states fall on R hip about 1 month ago. Hip was initially sore, but seems to be resolved now. Still has mild soreness and tightness in hip/glute. Back has been increasingly painful in last couple weeks. She also was having an RA flare. Pt states mild back pain here and there prevoiusly. She also has pain in R knee currently. Pt with recent Dx of RA, also has fibromyalgia. Pt has been unsing TENS unit for a couple days, which is helping. Pt states most pain in Bil SI region.    Limitations  Sitting;Lifting;Standing;Walking;House hold activities    Patient Stated Goals  decreased pain    Currently in Pain?  Yes    Pain Score  5     Pain  Location  Back    Pain Orientation  Right;Left;Lower    Pain Descriptors / Indicators  Aching;Tightness    Pain Type  Acute pain    Pain Onset  1 to 4 weeks ago    Pain Frequency  Intermittent    Aggravating Factors   standing, walking, bending.    Multiple Pain Sites  Yes    Pain Score  3    Pain Location  Hip    Pain Orientation  Right    Pain Descriptors / Indicators  Aching    Pain Type  Acute pain    Pain Onset  1 to 4 weeks ago    Pain Frequency  Intermittent         [facility] PT Assessment - 06/30/19 0001      Assessment   Medical Diagnosis  Back pain    Referring Provider (PT)  Lynne Leader    Prior Therapy  no      Balance Screen   Has the patient fallen in the past 6 months  Yes    How many times?  1    Has the patient had a decrease in  activity level because of a fear of falling?   No    Is the patient reluctant to leave their home because of a fear of falling?   No      Prior Function   Level of Independence  Independent      Cognition   Overall Cognitive Status  Within Functional Limits for tasks assessed      Posture/Postural Control   Posture Comments  increased lordosis lumbar spine       ROM / Strength   AROM / PROM / Strength  AROM;Strength      AROM   Overall AROM Comments  lumbar: mild limitation for exension, HIps: mild limitation for rotation.       Strength   Overall Strength Comments  Hips: 4-/5, Knee: 4/5       Palpation   Palpation comment  Pain at Bil SI, signifiant tenderness at bil glute, Hips, thighs,       Special Tests   Other special tests  Neg SLR, NEg FABIR/FADIR       Ambulation/Gait   Gait Comments  Increased lateral trunk sway, decreased fwd propulsion, cautious gait and initial contact                 Objective measurements completed on examination: See above findings.      [facility] PT Treatment/Exercise - 06/30/19 0001      Exercises   Exercises  Lumbar      Lumbar Exercises: Stretches   Single Knee to Chest Stretch  3 reps;30 seconds    Single Knee to Chest Stretch Limitations  towel    Pelvic Tilt  20 reps    Piriformis Stretch  2 reps;30 seconds    Piriformis Stretch Limitations  towel    Other Lumbar Stretch Exercise  Standing QL at wall x2 min             PT Education - 06/30/19 2131    Education Details  Initial HEP, PT POC, Exam findings    Person(s) Educated  Patient    Methods  Explanation;Demonstration;Tactile cues;Verbal cues;Handout    Comprehension  Verbalized understanding;Returned demonstration;Verbal cues required;Need further instruction       PT Short Term Goals - 06/30/19 2135      PT SHORT TERM GOAL #1   Title  Pt to be independent with initial HEP    Time  2    Period  Weeks    Status  New    Target Date  07/04/19       PT SHORT TERM GOAL #2   Title  Pt to report decreased pain in low back to 3/10  with activity    Time  2    Period  Weeks    Status  New    Target Date  07/04/19        PT Long Term Goals - 06/30/19 2136      PT LONG TERM GOAL #1   Title  Pt to be independent with final HEP    Time  6    Period  Weeks    Status  New    Target Date  08/01/19      PT LONG TERM GOAL #2   Title  Pt to report pain in low back to 0-2/10 with activity    Time  6    Period  Weeks    Status  New    Target Date  08/01/19      PT LONG TERM GOAL #3   Title  Pt to report decreased pain in R hip, to 0-2/10 with activity    Time  6    Period  Weeks    Status  New    Target Date  08/01/19      PT LONG TERM GOAL #4   Title  Pt to demo improved hip and core strength to at least 4+/5 to improve stability and pain    Time  6    Period  Weeks    Status  New    Target Date  08/01/19      PT LONG TERM GOAL #5   Title  Pt to demo correct squat/lift mechanics, to improve safety with IADLS.    Time  6    Period  Weeks    Status  New    Target Date  08/01/19             Plan - 06/30/19 2141    Clinical Impression Statement  Pt presents with primary complaint of increased pain in hip and R hip from fall. She has increased lordosis in standing, and decreased mechanics with bending/squat. Pt with decreased strength in core and hips, and would benefit from HEP for her Dx. Pt with decreased ROM, and abilty for functional activity, due to back pain. Pt with significant tenderness at several areas today, back, hips, thighs, R knee. Will continue to assess for active trigger points vs soreness from fibromyalgia. Pt to benefit from skilled PT to improve pain and deficits.    Personal Factors and Comorbidities  Fitness;Comorbidity 1    Comorbidities  RA, fibromyalgia,    Examination-Activity Limitations  Locomotion Level;Transfers;Sit;Sleep;Squat;Stairs;Stand;Lift    Examination-Participation  Restrictions  Cleaning;Community Activity;Shop    Stability/Clinical Decision Making  Stable/Uncomplicated    Clinical Decision Making  Low    Rehab Potential  Good    PT Frequency  2x / week    PT Duration  6 weeks    PT Treatment/Interventions  ADLs/Self Care Home Management;Cryotherapy;Electrical Stimulation;DME Instruction;Ultrasound;Traction;Moist Heat;Iontophoresis 4mg /ml Dexamethasone;Gait training;Stair training;Functional mobility training;Therapeutic activities;Therapeutic exercise;Balance training;Neuromuscular re-education;Manual techniques;Orthotic Fit/Training;Patient/family education;Passive range of motion;Dry needling;Energy conservation;Joint Manipulations;Spinal Manipulations;Taping    Consulted and Agree with Plan of Care  Patient       Patient will benefit from skilled therapeutic intervention in order  to improve the following deficits and impairments:  Abnormal gait, Decreased range of motion, Increased muscle spasms, Difficulty walking, Decreased endurance, Decreased activity tolerance, Pain, Impaired flexibility, Improper body mechanics, Decreased strength, Decreased mobility  Visit Diagnosis: Chronic bilateral low back pain without sciatica  Pain in right hip  Other muscle spasm     Problem List Patient Active Problem List   Diagnosis Date Noted  . Rheumatoid arthritis involving multiple sites with positive rheumatoid factor ([place]) 02/21/2019  . Vitamin B12 deficiency 02/14/2019  . Urge incontinence 12/20/2018  . Urinary frequency 12/20/2018  . Fibromyalgia 04/12/2018  . Vitamin D deficiency 04/12/2018  . Vaginal dryness 04/12/2018  . History of total hysterectomy 04/12/2018  . Insomnia 04/12/2018  . Morbid obesity ([place]) 04/12/2018  . History of colon polyps 04/12/2018  . Primary osteoarthritis of both knees 12/28/2017  . Cervicogenic headache 03/26/2014  . Cervical radiculopathy 03/26/2014  . Mild intermittent asthma 11/20/2013  . Bradycardia  07/04/2012    Lyndee Hensen, PT, DPT 9:54 PM  06/30/19    [facility] [place], [place], [address] Phone: [phone]   Fax:  [phone]  Name: Tyaja Strawderman MRN: [id number] Date of Birth: Sep 20, 1965

## 2019-06-30 NOTE — Therapy (Signed)
[facility] [address] [place], [place], [address] Phone: [phone]   Fax:  [phone]  Physical Therapy Treatment  Patient Details  Name: Nancy [name] MRN: [id number] Date of Birth: 1966/03/25 Referring Provider (PT): Lynne Leader   Encounter Date: 06/26/2019  PT End of Session - 06/30/19 2157    Visit Number  2    Number of Visits  12    Date for PT Re-Evaluation  08/01/19    Authorization Type  [employer]    PT Start Time  1103    PT Stop Time  1145    PT Time Calculation (min)  42 min    Activity Tolerance  Patient tolerated treatment well    Behavior During Therapy  [facility] for tasks assessed/performed       Past Medical History:  Diagnosis Date  . Anemia   . Asthma   . Bradycardia   . Chicken pox   . Dysrhythmia    occ. palpitations  . Fibromyalgia   . Headache(784.0)   . Migraines   . Neck pain    bulging disk  . Polyarthralgia 02/21/2019  . PONV (postoperative nausea and vomiting)    sometimes wakes up after surgery and cannot breath due to asthma, can be bradycardic  . Rheumatoid arthritis involving multiple sites with positive rheumatoid factor ([place]) 02/21/2019  . Shortness of breath    with bradycardia    Past Surgical History:  Procedure Laterality Date  . ABDOMINAL HYSTERECTOMY    . BACK SURGERY     cervical fusion C5,6,7 with a bulge at C4  . BLADDER SURGERY  July 10th 2013  . [place]  . CYSTOSCOPY N/A 07/31/2018   Procedure: CYSTOSCOPY;  Surgeon: Bjorn Loser, MD;  Location: [facility];  Service: Urology;  Laterality: N/A;  . DIAGNOSTIC LAPAROSCOPY    . EXCISION OF MESH N/A 07/31/2018   Procedure: REMOVAL OF VAGINAL MESH;  Surgeon: Bjorn Loser, MD;  Location: [facility];  Service: Urology;  Laterality: N/A;  . LAPAROSCOPY Bilateral 05/31/2013   Procedure: LAPAROSCOPY withbilateral salpingo-oophorectomy, lysis of adhesions;  Surgeon: Allena Katz, MD;   Location: [facility];  Service: Gynecology;  Laterality: Bilateral;  with clippings of Vaginal  mesh  . SALPINGOOPHORECTOMY Bilateral 05/31/2013   Procedure: BILATERAL SALPINGO OOPHORECTOMY/TRIM MESH;  Surgeon: Allena Katz, MD;  Location: [facility];  Service: Gynecology;  Laterality: Bilateral;  . TUBAL LIGATION     1995  . VAGINAL HYSTERECTOMY     1998 or 1997  . WRIST FRACTURE SURGERY  2005    There were no vitals filed for this visit.  Subjective Assessment - 06/30/19 2156    Subjective  Pt states slight improvment. Did not wear TENS today.    Currently in Pain?  Yes    Pain Score  4     Pain Location  Back    Pain Orientation  Right;Left;Lower    Pain Descriptors / Indicators  Aching;Tightness    Pain Type  Acute pain    Pain Onset  1 to 4 weeks ago    Pain Frequency  Intermittent                       [facility] Adult PT Treatment/Exercise - 06/30/19 2159      Ambulation/Gait   Gait Comments  --      Posture/Postural Control   Posture Comments  --  Exercises   Exercises  Lumbar      Lumbar Exercises: Stretches   Single Knee to Chest Stretch  3 reps;30 seconds    Single Knee to Chest Stretch Limitations  towel    Pelvic Tilt  20 reps    Piriformis Stretch  2 reps;30 seconds    Piriformis Stretch Limitations  towel    Other Lumbar Stretch Exercise  Standing QL at wall x2 min      Lumbar Exercises: Aerobic   Stationary Bike  L1 x 6 min      Lumbar Exercises: Standing   Row  20 reps    Theraband Level (Row)  Level 2 (Red)      Lumbar Exercises: Seated   Sit to Stand  5 reps      Lumbar Exercises: Supine   Ab Set  10 reps    Clam  20 reps    Bent Knee Raise  20 reps    Bridge  15 reps      Manual Therapy   Manual Therapy  Soft tissue mobilization    Soft tissue mobilization  STM/DTM to glute and hip (R)              PT Education - 06/30/19 2157    Education Details  HEP progressed    Person(s) Educated  Patient    Methods   Explanation;Demonstration;Handout;Tactile cues;Verbal cues    Comprehension  Verbalized understanding;Returned demonstration;Verbal cues required;Need further instruction       PT Short Term Goals - 06/30/19 2135      PT SHORT TERM GOAL #1   Title  Pt to be independent with initial HEP    Time  2    Period  Weeks    Status  New    Target Date  07/04/19      PT SHORT TERM GOAL #2   Title  Pt to report decreased pain in low back to 3/10  with activity    Time  2    Period  Weeks    Status  New    Target Date  07/04/19        PT Long Term Goals - 06/30/19 2136      PT LONG TERM GOAL #1   Title  Pt to be independent with final HEP    Time  6    Period  Weeks    Status  New    Target Date  08/01/19      PT LONG TERM GOAL #2   Title  Pt to report pain in low back to 0-2/10 with activity    Time  6    Period  Weeks    Status  New    Target Date  08/01/19      PT LONG TERM GOAL #3   Title  Pt to report decreased pain in R hip, to 0-2/10 with activity    Time  6    Period  Weeks    Status  New    Target Date  08/01/19      PT LONG TERM GOAL #4   Title  Pt to demo improved hip and core strength to at least 4+/5 to improve stability and pain    Time  6    Period  Weeks    Status  New    Target Date  08/01/19      PT LONG TERM GOAL #5   Title  Pt to demo correct squat/lift mechanics,  to improve safety with IADLS.    Time  6    Period  Weeks    Status  New    Target Date  08/01/19            Plan - 06/30/19 2158    Clinical Impression Statement  Ther ex for light strengthening progressed today, HEP updated. Pt with little pain during activity, but weakness noted in hips and core. Discussed posture, will continue to review.    Personal Factors and Comorbidities  Fitness;Comorbidity 1    Comorbidities  RA, fibromyalgia,    Examination-Activity Limitations  Locomotion Level;Transfers;Sit;Sleep;Squat;Stairs;Stand;Lift    Examination-Participation Restrictions   Cleaning;Community Activity;Shop    Stability/Clinical Decision Making  Stable/Uncomplicated    Rehab Potential  Good    PT Frequency  2x / week    PT Duration  6 weeks    PT Treatment/Interventions  ADLs/Self Care Home Management;Cryotherapy;Electrical Stimulation;DME Instruction;Ultrasound;Traction;Moist Heat;Iontophoresis 4mg /ml Dexamethasone;Gait training;Stair training;Functional mobility training;Therapeutic activities;Therapeutic exercise;Balance training;Neuromuscular re-education;Manual techniques;Orthotic Fit/Training;Patient/family education;Passive range of motion;Dry needling;Energy conservation;Joint Manipulations;Spinal Manipulations;Taping    Consulted and Agree with Plan of Care  Patient       Patient will benefit from skilled therapeutic intervention in order to improve the following deficits and impairments:  Abnormal gait, Decreased range of motion, Increased muscle spasms, Difficulty walking, Decreased endurance, Decreased activity tolerance, Pain, Impaired flexibility, Improper body mechanics, Decreased strength, Decreased mobility  Visit Diagnosis: Chronic bilateral low back pain without sciatica  Pain in right hip  Other muscle spasm     Problem List Patient Active Problem List   Diagnosis Date Noted  . Rheumatoid arthritis involving multiple sites with positive rheumatoid factor ([place]) 02/21/2019  . Vitamin B12 deficiency 02/14/2019  . Urge incontinence 12/20/2018  . Urinary frequency 12/20/2018  . Fibromyalgia 04/12/2018  . Vitamin D deficiency 04/12/2018  . Vaginal dryness 04/12/2018  . History of total hysterectomy 04/12/2018  . Insomnia 04/12/2018  . Morbid obesity ([place]) 04/12/2018  . History of colon polyps 04/12/2018  . Primary osteoarthritis of both knees 12/28/2017  . Cervicogenic headache 03/26/2014  . Cervical radiculopathy 03/26/2014  . Mild intermittent asthma 11/20/2013  . Bradycardia 07/04/2012    Lyndee Hensen, PT, DPT 10:01 PM   06/30/19    [facility], [place], [address] Phone: [phone]   Fax:  [phone]  Name: Nancy [name] MRN: [id number] Date of Birth: 12/15/1965

## 2019-07-01 ENCOUNTER — Other Ambulatory Visit: Payer: Self-pay | Admitting: Family Medicine

## 2019-07-01 DIAGNOSIS — Z1231 Encounter for screening mammogram for malignant neoplasm of breast: Secondary | ICD-10-CM

## 2019-07-01 MED FILL — predniSONE 10 MG TABS: 10 | 30 days supply | Qty: 30 | Fill #1

## 2019-07-08 ENCOUNTER — Other Ambulatory Visit (INDEPENDENT_AMBULATORY_CARE_PROVIDER_SITE_OTHER): Payer: 59

## 2019-07-08 DIAGNOSIS — M0579 Rheumatoid arthritis with rheumatoid factor of multiple sites without organ or systems involvement: Secondary | ICD-10-CM

## 2019-07-08 LAB — COMPREHENSIVE METABOLIC PANEL
ALT: 15 U/L (ref 0–35)
AST: 15 U/L (ref 0–37)
Albumin: 3.9 g/dL (ref 3.5–5.2)
Alkaline Phosphatase: 81 U/L (ref 39–117)
BUN: 16 mg/dL (ref 6–23)
CO2: 32 mEq/L (ref 19–32)
Calcium: 9.2 mg/dL (ref 8.4–10.5)
Chloride: 99 mEq/L (ref 96–112)
Creatinine, Ser: 0.7 mg/dL (ref 0.40–1.20)
GFR: 86.6 mL/min (ref >60.00)
Glucose, Bld: 82 mg/dL (ref 70–99)
Potassium: 3.9 mEq/L (ref 3.5–5.1)
Sodium: 138 mEq/L (ref 135–145)
Total Bilirubin: 0.3 mg/dL (ref 0.2–1.2)
Total Protein: 6.9 g/dL (ref 6.0–8.3)

## 2019-07-08 LAB — CBC WITH DIFFERENTIAL/PLATELET
Basophils Absolute: 0 10*3/uL (ref 0.0–0.1)
Basophils Relative: 0.5 % (ref 0.0–3.0)
Eosinophils Absolute: 0.1 10*3/uL (ref 0.0–0.7)
Eosinophils Relative: 1.9 % (ref 0.0–5.0)
HCT: 39.6 % (ref 36.0–46.0)
Hemoglobin: 12.7 g/dL (ref 12.0–15.0)
Lymphocytes Relative: 28.4 % (ref 12.0–46.0)
Lymphs Abs: 2 10*3/uL (ref 0.7–4.0)
MCHC: 32.1 g/dL (ref 30.0–36.0)
MCV: 87.5 fl (ref 78.0–100.0)
Monocytes Absolute: 0.6 10*3/uL (ref 0.1–1.0)
Monocytes Relative: 8.5 % (ref 3.0–12.0)
Neutro Abs: 4.3 10*3/uL (ref 1.4–7.7)
Neutrophils Relative %: 60.7 % (ref 43.0–77.0)
Platelets: 209 10*3/uL (ref 150.0–400.0)
RBC: 4.52 Mil/uL (ref 3.87–5.11)
RDW: 15.3 % (ref 11.5–15.5)
WBC: 7.1 10*3/uL (ref 4.0–10.5)

## 2019-07-08 LAB — SEDIMENTATION RATE: Sed Rate: 45 mm/hr — ABNORMAL HIGH (ref 0–30)

## 2019-07-11 ENCOUNTER — Ambulatory Visit (INDEPENDENT_AMBULATORY_CARE_PROVIDER_SITE_OTHER): Payer: 59 | Admitting: Family Medicine

## 2019-07-11 ENCOUNTER — Ambulatory Visit: Payer: Self-pay

## 2019-07-11 ENCOUNTER — Encounter: Payer: Self-pay | Admitting: Family Medicine

## 2019-07-11 VITALS — BP 116/68 | HR 71 | Temp 97.6°F | Ht 62.0 in | Wt 245.0 lb

## 2019-07-11 DIAGNOSIS — M25562 Pain in left knee: Secondary | ICD-10-CM

## 2019-07-11 DIAGNOSIS — G8929 Other chronic pain: Secondary | ICD-10-CM | POA: Diagnosis not present

## 2019-07-11 NOTE — Patient Instructions (Signed)
Thank you for coming in today.   Call or go to the ER if you develop a large red swollen joint with extreme pain or oozing puss.   

## 2019-07-11 NOTE — Progress Notes (Signed)
I, [name], LAT, ATC, am serving as scribe for Dr. Lynne Leader.  Nancy [name] is a 53 y.o. female who presents to [facility] today for previously arranged left knee injection..  She last saw Dr. Georgina Snell on 06/21/19 for R knee pain and had a 4/1 R knee injection.  Pt reports that her R knee has improved approximately 50% since her last visit and injection.  Now her L knee is bothering her more since treating her R knee.  She rates her L knee pain as a 5/10.  She states that her knee pain tends to improve when the weather is warmer.  She reports that she notices weakness in both of her legs which makes exercising difficult.  She does report some L knee swelling and specific tenderness to palpation at her L quad tendon just proximal to the L patella.  She states that she is currently off of her prednisone but con't to take Tramadol prn.    ROS:  As above  Exam:  BP 116/68 (BP Location: Left Arm, Patient Position: Sitting, Cuff Size: Large)   Pulse 71   Temp 97.6 F (36.4 C)   Ht 5\' 2"  (1.575 m)   Wt 245 lb (111.1 kg)   SpO2 95%   BMI 44.81 kg/m  Wt Readings from Last 5 Encounters:  07/11/19 245 lb (111.1 kg)  06/21/19 252 lb 9.6 oz (114.6 kg)  06/13/19 252 lb 9.6 oz (114.6 kg)  12/11/18 240 lb (108.9 kg)  11/01/18 244 lb (110.7 kg)   General: Well Developed, well nourished, and in no acute distress.  Neuro/Psych: Alert and oriented x3, extra-ocular muscles intact, able to move all 4 extremities, sensation grossly intact. Skin: Warm and dry, no rashes noted.  Respiratory: Not using accessory muscles, speaking in full sentences, trachea midline.  Cardiovascular: Pulses palpable, no extremity edema. Abdomen: Does not appear distended. MSK: Left knee: Mild effusion otherwise normal-appearing    Lab and Radiology Results Procedure: Real-time Ultrasound Guided Injection of left knee Device: Philips Affiniti 50G Images permanently stored and available for  review in the ultrasound unit. Verbal informed consent obtained.  Discussed risks and benefits of procedure. Warned about infection bleeding damage to structures skin hypopigmentation and fat atrophy among others. Patient expresses understanding and agreement Time-out conducted.   Noted no overlying erythema, induration, or other signs of local infection.   Skin prepped in a sterile fashion.   Local anesthesia: Topical Ethyl chloride.   With sterile technique and under real time ultrasound guidance:  40 mg of Kenalog and 4 mL of Marcaine injected easily.   Completed without difficulty   Pain immediately resolved suggesting accurate placement of the medication.   Advised to call if fevers/chills, erythema, induration, drainage, or persistent bleeding.   Images permanently stored and available for review in the ultrasound unit.  Impression: Technically successful ultrasound guided injection.         Assessment and Plan: 53 y.o. female with left knee injection due to pain from OA and RA.  Injection was previously arranged.  No office visit charge.  Procedure visit only.  Recheck as needed.  Precautions reviewed.  Follow-up with rheumatology as well.   PDMP not reviewed this encounter. Orders Placed This Encounter  Procedures  . NO CHG - [place] LOWER LEFT    Order Specific Question:   Reason for Exam (SYMPTOM  OR DIAGNOSIS REQUIRED)    Answer:   L knee pain    Order Specific Question:  Preferred imaging location?    Answer:   [facility]   No orders of the defined types were placed in this encounter.   Historical information moved to improve visibility of documentation.  Past Medical History:  Diagnosis Date  . Anemia   . Asthma   . Bradycardia   . Chicken pox   . Dysrhythmia    occ. palpitations  . Fibromyalgia   . Headache(784.0)   . Migraines   . Neck pain    bulging disk  . Polyarthralgia 02/21/2019  . PONV (postoperative nausea and vomiting)    sometimes  wakes up after surgery and cannot breath due to asthma, can be bradycardic  . Rheumatoid arthritis involving multiple sites with positive rheumatoid factor ([place]) 02/21/2019  . Shortness of breath    with bradycardia   Past Surgical History:  Procedure Laterality Date  . ABDOMINAL HYSTERECTOMY    . BACK SURGERY     cervical fusion C5,6,7 with a bulge at C4  . BLADDER SURGERY  July 10th 2013  . [place]  . CYSTOSCOPY N/A 07/31/2018   Procedure: CYSTOSCOPY;  Surgeon: Bjorn Loser, MD;  Location: [facility];  Service: Urology;  Laterality: N/A;  . DIAGNOSTIC LAPAROSCOPY    . EXCISION OF MESH N/A 07/31/2018   Procedure: REMOVAL OF VAGINAL MESH;  Surgeon: Bjorn Loser, MD;  [facility]: [facility];  Service: Urology;  Laterality: N/A;  . LAPAROSCOPY Bilateral 05/31/2013   Procedure: LAPAROSCOPY withbilateral salpingo-oophorectomy, lysis of adhesions;  Surgeon: Allena Katz, MD;  Location: [facility];  Service: Gynecology;  Laterality: Bilateral;  with clippings of Vaginal  mesh  . SALPINGOOPHORECTOMY Bilateral 05/31/2013   Procedure: BILATERAL SALPINGO OOPHORECTOMY/TRIM MESH;  Surgeon: Allena Katz, MD;  Location: [facility];  Service: Gynecology;  Laterality: Bilateral;  . TUBAL LIGATION     1995  . VAGINAL HYSTERECTOMY     1998 or 1997  . WRIST FRACTURE SURGERY  2005   Social History   Tobacco Use  . Smoking status: Never Smoker  . Smokeless tobacco: Never Used  Substance Use Topics  . Alcohol use: Yes    Alcohol/week: 0.0 standard drinks    Comment: occasional; just on special occasion   family history includes Arthritis in her mother; Atrial fibrillation in her paternal grandfather; Bradycardia in her paternal grandfather; COPD in her mother; Cancer - Colon in her maternal grandfather; Cervical cancer in her maternal grandmother; Diabetes in her maternal grandmother and paternal grandmother; Heart disease in her maternal grandfather and maternal  grandmother; Hyperlipidemia in her mother; Hypertension in her father and paternal grandfather; Lumbar disc disease in her brother and mother; Lung cancer in her paternal grandmother; Osteoporosis in her mother; Other in her brother and sister.  Medications: Current Outpatient Medications  Medication Sig Dispense Refill  . acetaminophen (TYLENOL) 500 MG tablet Take 500 mg by mouth every 6 (six) hours as needed (pain).     . Albuterol Sulfate (PROAIR RESPICLICK) 123XX123 (90 Base) MCG/ACT AEPB Inhale 1 puff into the lungs every 6 (six) hours as needed. 1 each 3  . Cholecalciferol (VITAMIN D3) 50 MCG (2000 UT) CHEW Chew by mouth.    . cyanocobalamin 1000 MCG tablet Take 1,000 mcg by mouth daily.    . Estradiol 10 MCG TABS vaginal tablet Place 1 tablet (10 mcg total) vaginally 2 (two) times a week. 24 tablet 3  . folic acid (FOLVITE) A999333 MCG tablet Take 400 mcg  by mouth daily.    . furosemide (LASIX) 20 MG tablet TAKE 1 TABLET BY MOUTH DAILY AS NEEDED FOR FLUID OR EDEMA (UP TO 3 DAYS). 30 tablet 1  . gabapentin (NEURONTIN) 300 MG capsule Take 1 capsule (300 mg total) by mouth 3 (three) times daily. 270 capsule 3  . hydrochlorothiazide (HYDRODIURIL) 25 MG tablet Take 1 tablet (25 mg total) by mouth daily as needed (fluid retention/edema). TAKE 1 TABLET BY MOUTH ONCE DAILY AS NEEDED FOR EDEMA 90 tablet 1  . methotrexate 2.5 MG tablet     . ondansetron (ZOFRAN) 4 MG tablet Take 1 tablet (4 mg total) by mouth every 8 (eight) hours as needed for nausea or vomiting. 20 tablet 0  . phentermine (ADIPEX-P) 37.5 MG tablet Take 1 tablet (37.5 mg total) by mouth daily before breakfast. 30 tablet 2  . tiZANidine (ZANAFLEX) 4 MG tablet Take 1 tablet (4 mg total) by mouth every 8 (eight) hours as needed for muscle spasms. 30 tablet 1  . traMADol (ULTRAM) 50 MG tablet Take one-two tablets every 4-6 hours as needed for moderate pain. 20 tablet 0  . Turmeric 500 MG CAPS Take by mouth 2 (two) times daily.    . valACYclovir  (VALTREX) 1000 MG tablet Take two tablets ( total 2000 mg) by mouth q12h x 1 day; Start: ASAP after symptom onset 6 tablet 2  . vitamin C (ASCORBIC ACID) 500 MG tablet Take 500 mg by mouth as needed.    . predniSONE (DELTASONE) 10 MG tablet      No current facility-administered medications for this visit.   Allergies  Allergen Reactions  . Advair Diskus [Fluticasone-Salmeterol] Cough    excessive  . Erythromycin Diarrhea and Nausea And Vomiting  . Percocet [Oxycodone-Acetaminophen] Other (See Comments)    hallucinations      Discussed warning signs or symptoms. Please see discharge instructions. Patient expresses understanding.  The above documentation has been reviewed and is accurate and complete Lynne Leader

## 2019-07-12 DIAGNOSIS — M1990 Unspecified osteoarthritis, unspecified site: Secondary | ICD-10-CM | POA: Diagnosis not present

## 2019-07-12 DIAGNOSIS — Z79899 Other long term (current) drug therapy: Secondary | ICD-10-CM | POA: Diagnosis not present

## 2019-07-12 DIAGNOSIS — M797 Fibromyalgia: Secondary | ICD-10-CM | POA: Diagnosis not present

## 2019-07-12 DIAGNOSIS — M0579 Rheumatoid arthritis with rheumatoid factor of multiple sites without organ or systems involvement: Secondary | ICD-10-CM | POA: Diagnosis not present

## 2019-07-16 ENCOUNTER — Encounter: Payer: 59 | Admitting: Physical Therapy

## 2019-07-16 ENCOUNTER — Ambulatory Visit (INDEPENDENT_AMBULATORY_CARE_PROVIDER_SITE_OTHER): Payer: 59 | Admitting: Physical Therapy

## 2019-07-16 DIAGNOSIS — M545 Low back pain, unspecified: Secondary | ICD-10-CM

## 2019-07-16 DIAGNOSIS — M62838 Other muscle spasm: Secondary | ICD-10-CM | POA: Diagnosis not present

## 2019-07-16 DIAGNOSIS — G8929 Other chronic pain: Secondary | ICD-10-CM

## 2019-07-16 DIAGNOSIS — M25551 Pain in right hip: Secondary | ICD-10-CM

## 2019-07-16 DIAGNOSIS — M25561 Pain in right knee: Secondary | ICD-10-CM

## 2019-07-16 NOTE — Patient Instructions (Signed)
Access Code: [email or web address]  URL: [email or web address]  Date: 07/16/2019  Prepared by: Lyndee Hensen   Exercises Straight Leg Raise - 10 reps - 2 sets - 1x daily Sidelying Hip Abduction - 10 reps - 2 sets - 1x daily

## 2019-07-17 ENCOUNTER — Encounter: Payer: Self-pay | Admitting: Physical Therapy

## 2019-07-17 NOTE — Therapy (Signed)
[facility] [address] [place], [place], [address] Phone: [phone]   Fax:  [phone]  Physical Therapy Treatment  Patient Details  Name: Nancy [name] MRN: [id number] Date of Birth: [birth year] Referring Provider (PT): Lynne Leader   Encounter Date: 07/16/2019  PT End of Session - 07/17/19 0837    Visit Number  3    Number of Visits  12    Date for PT Re-Evaluation  08/01/19    Authorization Type  [employer]    PT Start Time  1103    PT Stop Time  1143    PT Time Calculation (min)  40 min    Activity Tolerance  Patient tolerated treatment well    Behavior During Therapy  [facility] for tasks assessed/performed       Past Medical History:  Diagnosis Date  . Anemia   . Asthma   . Bradycardia   . Chicken pox   . Dysrhythmia    occ. palpitations  . Fibromyalgia   . Headache(784.0)   . Migraines   . Neck pain    bulging disk  . Polyarthralgia 02/21/2019  . PONV (postoperative nausea and vomiting)    sometimes wakes up after surgery and cannot breath due to asthma, can be bradycardic  . Rheumatoid arthritis involving multiple sites with positive rheumatoid factor ([place]) 02/21/2019  . Shortness of breath    with bradycardia    Past Surgical History:  Procedure Laterality Date  . ABDOMINAL HYSTERECTOMY    . BACK SURGERY     cervical fusion C5,6,7 with a bulge at C4  . BLADDER SURGERY  July 10th 2013  . [place]  . CYSTOSCOPY N/A 07/31/2018   Procedure: CYSTOSCOPY;  Surgeon: Bjorn Loser, MD;  Location: [facility];  Service: Urology;  Laterality: N/A;  . DIAGNOSTIC LAPAROSCOPY    . EXCISION OF MESH N/A 07/31/2018   Procedure: REMOVAL OF VAGINAL MESH;  Surgeon: Bjorn Loser, MD;  Location: [facility];  Service: Urology;  Laterality: N/A;  . LAPAROSCOPY Bilateral 05/31/2013   Procedure: LAPAROSCOPY withbilateral salpingo-oophorectomy, lysis of adhesions;  Surgeon: Allena Katz, MD;   Location: [facility];  Service: Gynecology;  Laterality: Bilateral;  with clippings of Vaginal  mesh  . SALPINGOOPHORECTOMY Bilateral 05/31/2013   Procedure: BILATERAL SALPINGO OOPHORECTOMY/TRIM MESH;  Surgeon: Allena Katz, MD;  Location: [facility];  Service: Gynecology;  Laterality: Bilateral;  . TUBAL LIGATION     1995  . VAGINAL HYSTERECTOMY     1998 or 1997  . WRIST FRACTURE SURGERY  2005    There were no vitals filed for this visit.  Subjective Assessment - 07/17/19 0836    Subjective  Pt states variable pain. Low back has been doing better, but R hip/glute has been very sore last couple days.    Currently in Pain?  Yes    Pain Score  2     Pain Location  Back    Pain Orientation  Right;Left;Lower    Pain Descriptors / Indicators  Aching;Tightness    Pain Type  Acute pain    Pain Onset  1 to 4 weeks ago    Pain Frequency  Intermittent    Pain Score  6    Pain Location  Hip    Pain Orientation  Right    Pain Descriptors / Indicators  Aching;Tightness    Pain Type  Acute pain    Pain Onset  1 to 4 weeks ago    Pain Frequency  Intermittent                       [facility] Adult PT Treatment/Exercise - 07/17/19 0001      Lumbar Exercises: Stretches   Single Knee to Chest Stretch  3 reps;30 seconds    Single Knee to Chest Stretch Limitations  towel    Pelvic Tilt  20 reps    ITB Stretch  2 reps;30 seconds    ITB Stretch Limitations  supine with strap     Piriformis Stretch  2 reps;30 seconds    Piriformis Stretch Limitations  towel      Lumbar Exercises: Aerobic   Stationary Bike  L1 x 6 min      Lumbar Exercises: Supine   Bridge  20 reps    Straight Leg Raise  20 reps      Lumbar Exercises: Sidelying   Hip Abduction  15 reps      Manual Therapy   Manual therapy comments  skilled palpation and monitoring of soft tissue with dry needling.     Soft tissue mobilization  STM/DTM to glute and hip (R)        Trigger Point Dry Needling - 07/17/19 0001     Consent Given?  Yes    Education Handout Provided  Yes    Muscles Treated Back/Hip  Gluteus maximus;Gluteus medius;Piriformis    Gluteus Medius Response  Palpable increased muscle length    Gluteus Maximus Response  Palpable increased muscle length    Piriformis Response  Palpable increased muscle length           PT Education - 07/17/19 0837    Education Details  HEP updated for hip strengthening.    Person(s) Educated  Patient    Methods  Explanation;Demonstration;Tactile cues;Verbal cues;Handout    Comprehension  Verbalized understanding;Returned demonstration;Verbal cues required;Tactile cues required;Need further instruction       PT Short Term Goals - 06/30/19 2135      PT SHORT TERM GOAL #1   Title  Pt to be independent with initial HEP    Time  2    Period  Weeks    Status  New    Target Date  07/04/19      PT SHORT TERM GOAL #2   Title  Pt to report decreased pain in low back to 3/10  with activity    Time  2    Period  Weeks    Status  New    Target Date  07/04/19        PT Long Term Goals - 06/30/19 2136      PT LONG TERM GOAL #1   Title  Pt to be independent with final HEP    Time  6    Period  Weeks    Status  New    Target Date  08/01/19      PT LONG TERM GOAL #2   Title  Pt to report pain in low back to 0-2/10 with activity    Time  6    Period  Weeks    Status  New    Target Date  08/01/19      PT LONG TERM GOAL #3   Title  Pt to report decreased pain in R hip, to 0-2/10 with activity    Time  6    Period  Weeks    Status  New  Target Date  08/01/19      PT LONG TERM GOAL #4   Title  Pt to demo improved hip and core strength to at least 4+/5 to improve stability and pain    Time  6    Period  Weeks    Status  New    Target Date  08/01/19      PT LONG TERM GOAL #5   Title  Pt to demo correct squat/lift mechanics, to improve safety with IADLS.    Time  6    Period  Weeks    Status  New    Target Date  08/01/19             Plan - 07/17/19 [id number]    Clinical Impression Statement  Pt with soreness, tightness and trigger points in R glute, piriformis today. Manual DTM and DN done to improve. Continued education on ther ex, stretching, and progressed strengthening, with updated HEP.    Personal Factors and Comorbidities  Fitness;Comorbidity 1    Comorbidities  RA, fibromyalgia,    Examination-Activity Limitations  Locomotion Level;Transfers;Sit;Sleep;Squat;Stairs;Stand;Lift    Examination-Participation Restrictions  Cleaning;Community Activity;Shop    Stability/Clinical Decision Making  Stable/Uncomplicated    Rehab Potential  Good    PT Frequency  2x / week    PT Duration  6 weeks    PT Treatment/Interventions  ADLs/Self Care Home Management;Cryotherapy;Electrical Stimulation;DME Instruction;Ultrasound;Traction;Moist Heat;Iontophoresis 4mg /ml Dexamethasone;Gait training;Stair training;Functional mobility training;Therapeutic activities;Therapeutic exercise;Balance training;Neuromuscular re-education;Manual techniques;Orthotic Fit/Training;Patient/family education;Passive range of motion;Dry needling;Energy conservation;Joint Manipulations;Spinal Manipulations;Taping    Consulted and Agree with Plan of Care  Patient       Patient will benefit from skilled therapeutic intervention in order to improve the following deficits and impairments:  Abnormal gait, Decreased range of motion, Increased muscle spasms, Difficulty walking, Decreased endurance, Decreased activity tolerance, Pain, Impaired flexibility, Improper body mechanics, Decreased strength, Decreased mobility  Visit Diagnosis: Chronic bilateral low back pain without sciatica  Pain in right hip  Other muscle spasm  Chronic pain of right knee     Problem List Patient Active Problem List   Diagnosis Date Noted  . Rheumatoid arthritis involving multiple sites with positive rheumatoid factor ([place]) 02/21/2019  . Vitamin B12 deficiency  02/14/2019  . Urge incontinence 12/20/2018  . Urinary frequency 12/20/2018  . Fibromyalgia 04/12/2018  . Vitamin D deficiency 04/12/2018  . Vaginal dryness 04/12/2018  . History of total hysterectomy 04/12/2018  . Insomnia 04/12/2018  . Morbid obesity ([place]) 04/12/2018  . History of colon polyps 04/12/2018  . Primary osteoarthritis of both knees 12/28/2017  . Cervicogenic headache 03/26/2014  . Cervical radiculopathy 03/26/2014  . Mild intermittent asthma 11/20/2013  . Bradycardia 07/04/2012    Lyndee Hensen, PT, DPT 8:45 AM  07/17/19    [facility] [place] [place], [place], [address] Phone: [phone]   Fax:  [phone]  Name: Nancy [name] MRN: [id number] Date of Birth: [birth year]

## 2019-07-18 ENCOUNTER — Ambulatory Visit: Payer: 59 | Admitting: Pharmacist

## 2019-07-18 ENCOUNTER — Other Ambulatory Visit: Payer: Self-pay

## 2019-07-18 ENCOUNTER — Encounter: Payer: 59 | Admitting: Physical Therapy

## 2019-07-18 DIAGNOSIS — Z7189 Other specified counseling: Secondary | ICD-10-CM

## 2019-07-18 MED ORDER — HUMIRA (2 SYRINGE) 40 MG/0.4ML ~~LOC~~ PSKT: 40.0000 mg | PREFILLED_SYRINGE | SUBCUTANEOUS | 3 refills | Status: DC

## 2019-07-18 MED FILL — GABAPENTIN 300 MG CAPSULE: 300 | 90 days supply | Qty: 270 | Fill #1

## 2019-07-18 MED FILL — METHOTREXATE SODIUM 2.5 MG: 2.5 | 28 days supply | Qty: 32 | Fill #1

## 2019-07-18 MED FILL — FOLIC ACID 1 MG TABS: 1 | 30 days supply | Qty: 30 | Fill #2

## 2019-07-18 NOTE — Progress Notes (Signed)
Virtual Visit via Telephone Note Due to current restrictions/limitations of in-office visits due to the COVID-19 pandemic, this scheduled clinical appointment was converted to a telehealth visit  I connected with Nancy [name]. [name] on 12/17/20at1:30pmby telephone.Verified that I was speaking toherusing two patient identifiers.  I am in my office. The patientis athome.Only the patientand myself participated in this encounter.  S: Patient presents for review of their specialty medication therapy.  Patient is currently prescribed Humira for RA. Patient is managed by Dr. Kathlene [name] for this.   Adherence: has not yet started  Efficacy: has not yet started  Dosing:  Rheumatoid arthritis: SubQ: 40 mg every other week (may continue methotrexate, other nonbiologic DMARDS, corticosteroids, NSAIDs, and/or analgesics); patients not taking concomitant methotrexate may increase dose to 40 mg every week  Dose adjustments: Renal: no dose adjustments (has not been studied) Hepatic: no dose adjustments (has not been studied)  Drug-drug interactions: none currently   Screening: TB test: completed per pt Hepatitis: completed per pt   Monitoring: S/sx of infection: denies  CBC: monitored by Dr. Kathlene [name] S/sx of hypersensitivity: has not started; counseling given  S/sx of malignancy: has not started; counseling given  S/sx of heart failure: has not started; counseling given   Other side effects: has not started; counseling given   O:   Lab Results  Component Value Date   WBC 7.1 07/08/2019   HGB 12.7 07/08/2019   HCT 39.6 07/08/2019   MCV 87.5 07/08/2019   PLT 209.0 07/08/2019      Chemistry      Component Value Date/Time   NA 138 07/08/2019 0804   NA 141 05/03/2019 0000   K 3.9 07/08/2019 0804   CL 99 07/08/2019 0804   CO2 32 07/08/2019 0804   BUN 16 07/08/2019 0804   BUN 17 05/03/2019 0000   CREATININE 0.70 07/08/2019 0804   GLU 80 05/03/2019 0000      Component  Value Date/Time   CALCIUM 9.2 07/08/2019 0804   ALKPHOS 81 07/08/2019 0804   AST 15 07/08/2019 0804   ALT 15 07/08/2019 0804   BILITOT 0.3 07/08/2019 0804       A/P: 1. Medication review: Patient currently prescribed Humira for RA. Reviewed the medication with the patient, including the following: Humira is a TNF blocking agent indicated for RA. Patient educated on purpose, proper use and potential adverse effects of Humira. Possible adverse effects are increased risk of infections, headache, and injection site reactions. There is the possibility of an increased risk of malignancy but it is not well understood if this increased risk is due to there medication or the disease state. There are rare cases of pancytopenia and aplastic anemia. For SubQ injection at separate sites in the thigh or lower abdomen (avoiding areas within 2 inches of navel); rotate injection sites. May leave at room temperature for ~15 to 30 minutes prior to use; do not remove cap or cover while allowing product to reach room temperature. Do not use if solution is discolored or contains particulate matter. Do not administer to skin which is red, tender, bruised, hard, or that has scars, stretch marks, or psoriasis plaques. Needle cap of the prefilled syringe or needle cover for the adalimumab pen may contain latex. Prefilled pens and syringes are available for use by patients and the full amount of the syringe should be injected (self-administration). No recommendations for any changes at this time.  Nancy [name], PharmD, [place] [phone]

## 2019-07-23 ENCOUNTER — Other Ambulatory Visit: Payer: Self-pay | Admitting: Pharmacist

## 2019-07-23 MED ORDER — HUMIRA (2 PEN) 40 MG/0.4ML ~~LOC~~ AJKT: 40.0000 mg | AUTO-INJECTOR | SUBCUTANEOUS | 3 refills | Status: DC

## 2019-07-23 MED FILL — HUMIRA PEN 40 MG/0.4ML PNKT: 40 | 28 days supply | Qty: 2 | Fill #0

## 2019-07-24 ENCOUNTER — Encounter: Payer: 59 | Admitting: Physical Therapy

## 2019-07-29 ENCOUNTER — Encounter: Payer: Self-pay | Admitting: Family Medicine

## 2019-08-01 ENCOUNTER — Ambulatory Visit: Payer: 59 | Admitting: Physical Therapy

## 2019-08-01 ENCOUNTER — Other Ambulatory Visit: Payer: Self-pay

## 2019-08-01 ENCOUNTER — Encounter: Payer: Self-pay | Admitting: Physical Therapy

## 2019-08-01 DIAGNOSIS — M545 Low back pain, unspecified: Secondary | ICD-10-CM

## 2019-08-01 DIAGNOSIS — M25551 Pain in right hip: Secondary | ICD-10-CM

## 2019-08-01 DIAGNOSIS — M25561 Pain in right knee: Secondary | ICD-10-CM

## 2019-08-01 DIAGNOSIS — M25562 Pain in left knee: Secondary | ICD-10-CM | POA: Diagnosis not present

## 2019-08-01 DIAGNOSIS — G8929 Other chronic pain: Secondary | ICD-10-CM

## 2019-08-01 DIAGNOSIS — M62838 Other muscle spasm: Secondary | ICD-10-CM

## 2019-08-01 NOTE — Therapy (Signed)
[facility] [address] [place], [place], [address] Phone: [phone]   Fax:  [phone]  Physical Therapy Treatment  Patient Details  Name: Nancy [name] MRN: [id number] Date of Birth: [birth year] Referring Provider (PT): Lynne Leader   Encounter Date: 08/01/2019  PT End of Session - 08/01/19 1204    Visit Number  4    Number of Visits  12    Date for PT Re-Evaluation  08/01/19    Authorization Type  [employer]    PT Start Time  1104    PT Stop Time  1144    PT Time Calculation (min)  40 min    Activity Tolerance  Patient tolerated treatment well    Behavior During Therapy  [facility] for tasks assessed/performed       Past Medical History:  Diagnosis Date  . Anemia   . Asthma   . Bradycardia   . Chicken pox   . Dysrhythmia    occ. palpitations  . Fibromyalgia   . Headache(784.0)   . Migraines   . Neck pain    bulging disk  . Polyarthralgia 02/21/2019  . PONV (postoperative nausea and vomiting)    sometimes wakes up after surgery and cannot breath due to asthma, can be bradycardic  . Rheumatoid arthritis involving multiple sites with positive rheumatoid factor ([place]) 02/21/2019  . Shortness of breath    with bradycardia    Past Surgical History:  Procedure Laterality Date  . ABDOMINAL HYSTERECTOMY    . BACK SURGERY     cervical fusion C5,6,7 with a bulge at C4  . BLADDER SURGERY  July 10th 2013  . [place]  . CYSTOSCOPY N/A 07/31/2018   Procedure: CYSTOSCOPY;  Surgeon: Bjorn Loser, MD;  Location: [facility];  Service: Urology;  Laterality: N/A;  . DIAGNOSTIC LAPAROSCOPY    . EXCISION OF MESH N/A 07/31/2018   Procedure: REMOVAL OF VAGINAL MESH;  Surgeon: Bjorn Loser, MD;  Location: [facility];  Service: Urology;  Laterality: N/A;  . LAPAROSCOPY Bilateral 05/31/2013   Procedure: LAPAROSCOPY withbilateral salpingo-oophorectomy, lysis of adhesions;  Surgeon: Allena Katz, MD;   Location: [facility];  Service: Gynecology;  Laterality: Bilateral;  with clippings of Vaginal  mesh  . SALPINGOOPHORECTOMY Bilateral 05/31/2013   Procedure: BILATERAL SALPINGO OOPHORECTOMY/TRIM MESH;  Surgeon: Allena Katz, MD;  Location: [facility];  Service: Gynecology;  Laterality: Bilateral;  . TUBAL LIGATION     1995  . VAGINAL HYSTERECTOMY     1998 or 1997  . WRIST FRACTURE SURGERY  2005    There were no vitals filed for this visit.  Subjective Assessment - 08/01/19 1200    Subjective  Pt states increased pain in R hip//glute, into thigh. Had a good several days with less pain, but now sore again.    Patient Stated Goals  decreased pain    Currently in Pain?  Yes    Pain Score  5     Pain Location  Back    Pain Orientation  Right;Left;Lower    Pain Descriptors / Indicators  Aching    Pain Type  Acute pain    Pain Onset  1 to 4 weeks ago    Pain Frequency  Intermittent    Multiple Pain Sites  Yes    Pain Location  Hip    Pain Orientation  Right    Pain Descriptors / Indicators  Aching  Pain Type  Acute pain    Pain Onset  More than a month ago    Pain Frequency  Intermittent                       [facility] Adult PT Treatment/Exercise - 08/01/19 0001      Self-Care   Self-Care  Posture    Posture  discussed log roll for transfers and decreasing rotation on planted foot in standing when changing directions and in kitchen/cooking.       Lumbar Exercises: Stretches   Active Hamstring Stretch  3 reps;30 seconds    Active Hamstring Stretch Limitations  seated    Single Knee to Chest Stretch  3 reps;30 seconds    Single Knee to Chest Stretch Limitations  towel    Pelvic Tilt  20 reps      Lumbar Exercises: Seated   Long Arc Quad on Chair  10 reps;Both      Lumbar Exercises: Supine   Bridge  20 reps    Straight Leg Raise  20 reps    Other Supine Lumbar Exercises  hip abd/add and ir/er manually resisted x10 each;       Lumbar Exercises: Sidelying   Hip  Abduction  15 reps;Both      Manual Therapy   Manual Therapy  Joint mobilization    Joint Mobilization  long leg distraction bil, for hip and lumbar pump ,  S/L pelvic mobs, Hip inf glides  .    Soft tissue mobilization  STM/DTM to glute and hip (R)                PT Short Term Goals - 06/30/19 2135      PT SHORT TERM GOAL #1   Title  Pt to be independent with initial HEP    Time  2    Period  Weeks    Status  New    Target Date  07/04/19      PT SHORT TERM GOAL #2   Title  Pt to report decreased pain in low back to 3/10  with activity    Time  2    Period  Weeks    Status  New    Target Date  07/04/19        PT Long Term Goals - 06/30/19 2136      PT LONG TERM GOAL #1   Title  Pt to be independent with final HEP    Time  6    Period  Weeks    Status  New    Target Date  08/01/19      PT LONG TERM GOAL #2   Title  Pt to report pain in low back to 0-2/10 with activity    Time  6    Period  Weeks    Status  New    Target Date  08/01/19      PT LONG TERM GOAL #3   Title  Pt to report decreased pain in R hip, to 0-2/10 with activity    Time  6    Period  Weeks    Status  New    Target Date  08/01/19      PT LONG TERM GOAL #4   Title  Pt to demo improved hip and core strength to at least 4+/5 to improve stability and pain    Time  6    Period  Weeks    Status  New    Target Date  08/01/19      PT LONG TERM GOAL #5   Title  Pt to demo correct squat/lift mechanics, to improve safety with IADLS.    Time  6    Period  Weeks    Status  New    Target Date  08/01/19            Plan - 08/01/19 1205    Clinical Impression Statement  Pt with variable pain. Recommended pt continue HEP daily, for stretching and stabilization. Pt with most soreness and trigger points in R glute today. Manual focus for this region today. Reviewed log roll for transfers and not twisting on planted foot in standing, for decreased pain.    Personal Factors and Comorbidities   Fitness;Comorbidity 1    Comorbidities  RA, fibromyalgia,    Examination-Activity Limitations  Locomotion Level;Transfers;Sit;Sleep;Squat;Stairs;Stand;Lift    Examination-Participation Restrictions  Cleaning;Community Activity;Shop    Stability/Clinical Decision Making  Stable/Uncomplicated    Rehab Potential  Good    PT Frequency  2x / week    PT Duration  6 weeks    PT Treatment/Interventions  ADLs/Self Care Home Management;Cryotherapy;Electrical Stimulation;DME Instruction;Ultrasound;Traction;Moist Heat;Iontophoresis 4mg /ml Dexamethasone;Gait training;Stair training;Functional mobility training;Therapeutic activities;Therapeutic exercise;Balance training;Neuromuscular re-education;Manual techniques;Orthotic Fit/Training;Patient/family education;Passive range of motion;Dry needling;Energy conservation;Joint Manipulations;Spinal Manipulations;Taping    Consulted and Agree with Plan of Care  Patient       Patient will benefit from skilled therapeutic intervention in order to improve the following deficits and impairments:  Abnormal gait, Decreased range of motion, Increased muscle spasms, Difficulty walking, Decreased endurance, Decreased activity tolerance, Pain, Impaired flexibility, Improper body mechanics, Decreased strength, Decreased mobility  Visit Diagnosis: Chronic bilateral low back pain without sciatica  Pain in right hip  Other muscle spasm  Chronic pain of right knee  Chronic pain of left knee     Problem List Patient Active Problem List   Diagnosis Date Noted  . Rheumatoid arthritis involving multiple sites with positive rheumatoid factor ([place]) 02/21/2019  . Vitamin B12 deficiency 02/14/2019  . Urge incontinence 12/20/2018  . Urinary frequency 12/20/2018  . Fibromyalgia 04/12/2018  . Vitamin D deficiency 04/12/2018  . Vaginal dryness 04/12/2018  . History of total hysterectomy 04/12/2018  . Insomnia 04/12/2018  . Morbid obesity ([place]) 04/12/2018  . History of  colon polyps 04/12/2018  . Primary osteoarthritis of both knees 12/28/2017  . Cervicogenic headache 03/26/2014  . Cervical radiculopathy 03/26/2014  . Mild intermittent asthma 11/20/2013  . Bradycardia 07/04/2012    Lyndee Hensen, PT, DPT 12:09 PM  08/01/19    [facility] [place], [place], [address] Phone: [phone]   Fax:  [phone]  Name: Nancy [name] MRN: [id number] Date of Birth: 03-17-1966

## 2019-08-15 ENCOUNTER — Ambulatory Visit
Admission: RE | Admit: 2019-08-15 | Discharge: 2019-08-15 | Disposition: A | Discharge status: Home / Self Care | Payer: 59 | Source: Ambulatory Visit | Attending: Family Medicine | Admitting: Family Medicine

## 2019-08-15 ENCOUNTER — Other Ambulatory Visit: Payer: Self-pay

## 2019-08-15 DIAGNOSIS — Z1231 Encounter for screening mammogram for malignant neoplasm of breast: Secondary | ICD-10-CM | POA: Diagnosis not present

## 2019-08-15 MED FILL — FOLIC ACID 1 MG TABS: 1 | 30 days supply | Qty: 30 | Fill #1

## 2019-08-15 MED FILL — METHOTREXATE SODIUM 2.5 MG: 2.5 | 28 days supply | Qty: 32 | Fill #2

## 2019-08-16 ENCOUNTER — Ambulatory Visit: Payer: 59

## 2019-08-16 MED FILL — HUMIRA PEN 40 MG/0.4ML PNKT: 40 | 28 days supply | Qty: 2 | Fill #1

## 2019-09-12 ENCOUNTER — Other Ambulatory Visit: Payer: Self-pay | Admitting: Physician Assistant

## 2019-09-12 DIAGNOSIS — M0579 Rheumatoid arthritis with rheumatoid factor of multiple sites without organ or systems involvement: Secondary | ICD-10-CM

## 2019-09-13 MED FILL — FOLIC ACID 1 MG TABS: 1 | 30 days supply | Qty: 30 | Fill #2

## 2019-09-13 MED FILL — METHOTREXATE SODIUM 2.5 MG: 2.5 | 14 days supply | Qty: 16 | Fill #3

## 2019-09-13 MED FILL — HUMIRA PEN 40 MG/0.4ML PNKT: 40 | 28 days supply | Qty: 2 | Fill #2

## 2019-09-19 ENCOUNTER — Ambulatory Visit: Payer: 59 | Admitting: Obstetrics and Gynecology

## 2019-09-25 MED FILL — METHOTREXATE SODIUM 2.5 MG: 2.5 | 35 days supply | Qty: 40 | Fill #0

## 2019-10-03 ENCOUNTER — Ambulatory Visit (INDEPENDENT_AMBULATORY_CARE_PROVIDER_SITE_OTHER): Payer: 59 | Admitting: Family Medicine

## 2019-10-03 ENCOUNTER — Encounter: Payer: Self-pay | Admitting: Family Medicine

## 2019-10-03 ENCOUNTER — Ambulatory Visit: Payer: Self-pay

## 2019-10-03 ENCOUNTER — Other Ambulatory Visit: Payer: Self-pay

## 2019-10-03 VITALS — BP 110/70 | HR 58 | Ht 62.0 in | Wt 251.0 lb

## 2019-10-03 DIAGNOSIS — M25562 Pain in left knee: Secondary | ICD-10-CM | POA: Diagnosis not present

## 2019-10-03 DIAGNOSIS — G8929 Other chronic pain: Secondary | ICD-10-CM

## 2019-10-03 DIAGNOSIS — M25561 Pain in right knee: Secondary | ICD-10-CM | POA: Diagnosis not present

## 2019-10-03 NOTE — Progress Notes (Signed)
I, [name], LAT, ATC, am serving as scribe for Dr. Lynne Leader.  Nancy [name] is a 54 y.o. female who presents to [place] at [facility] today for B knee pain.  She was last seen by Dr. Georgina Snell on 07/11/19 for L knee pain and had a L knee injection.  She had a prior R knee injection on 06/21/19.  Since her last visit, she reports worsening B knee pain over the past few weeks.  She notes slight swelling in her B knees but mainly pain that she rates at a 4-5/10.  B knee pain is worse at night and during the day w/ prolonged standing and walking.  She states that she has been having some random sharp, shooting pain in her L knee.  She has been taking IBU and Tramadol.  She is also taking Humira for the last 3 months.   Pertinent review of systems: No fevers or chills  Relevant historical information: Rheumatoid arthritis   Exam:  BP 110/70 (BP Location: Left Arm, Patient Position: Sitting, Cuff Size: Large)   Pulse (!) 58   Ht 5\' 2"  (1.575 m)   Wt 251 lb (113.9 kg)   SpO2 96%   BMI 45.91 kg/m  General: Well Developed, well nourished, and in no acute distress.   MSK: Knees bilaterally moderate effusion normal motion tender palpation diffusely. Normal strength.    Lab and Radiology Results  Procedure: Real-time Ultrasound Guided Injection of right knee Device: Philips Affiniti 50G Images permanently stored and available for review in the ultrasound unit. Verbal informed consent obtained.  Discussed risks and benefits of procedure. Warned about infection bleeding damage to structures skin hypopigmentation and fat atrophy among others. Patient expresses understanding and agreement Time-out conducted.   Noted no overlying erythema, induration, or other signs of local infection.   Skin prepped in a sterile fashion.   Local anesthesia: Topical Ethyl chloride.   With sterile technique and under real time ultrasound guidance:  40 mg of Kenalog and 3 mL of  Marcaine injected easily.   Completed without difficulty   Pain immediately resolved suggesting accurate placement of the medication.   Advised to call if fevers/chills, erythema, induration, drainage, or persistent bleeding.   Images permanently stored and available for review in the ultrasound unit.  Impression: Technically successful ultrasound guided injection.   Procedure: Real-time Ultrasound Guided Injection of left knee Device: Philips Affiniti 50G Images permanently stored and available for review in the ultrasound unit. Verbal informed consent obtained.  Discussed risks and benefits of procedure. Warned about infection bleeding damage to structures skin hypopigmentation and fat atrophy among others. Patient expresses understanding and agreement Time-out conducted.   Noted no overlying erythema, induration, or other signs of local infection.   Skin prepped in a sterile fashion.   Local anesthesia: Topical Ethyl chloride.   With sterile technique and under real time ultrasound guidance:  40 mg of Kenalog and 3 mL of Marcaine injected easily.   Completed without difficulty   Pain immediately resolved suggesting accurate placement of the medication.   Advised to call if fevers/chills, erythema, induration, drainage, or persistent bleeding.   Images permanently stored and available for review in the ultrasound unit.  Impression: Technically successful ultrasound guided injection.    Assessment and Plan: 54 y.o. 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  Its been 3 months and 4 months since her last knee injections.  Plan to repeat steroid injection as above  today.  Additionally recommend weight loss and quad strengthening.  Recheck back as needed.  Discussion stated bit about when it is time for knee replacement.  I do not think she is there quite yet but it is reasonable start thinking about that and start working on weight loss during now so that her BMI  is less than 35 prior to total knee replacement.   Orders Placed This Encounter  Procedures  . [place] LIMITED JOINT SPACE STRUCTURES LOW BILAT(NO LINKED CHARGES)    Order Specific Question:   Reason for Exam (SYMPTOM  OR DIAGNOSIS REQUIRED)    Answer:   B knee pain    Order Specific Question:   Preferred imaging location?    Answer:   [place]   No orders of the defined types were placed in this encounter.    Discussed warning signs or symptoms. Please see discharge instructions. Patient expresses understanding.   The above documentation has been reviewed and is accurate and complete Lynne Leader

## 2019-10-03 NOTE — Patient Instructions (Signed)
Thank you for coming in today.  Call or go to the ER if you develop a large red swollen joint with extreme pain or oozing puss.   Recheck with me as needed.    

## 2019-10-07 ENCOUNTER — Other Ambulatory Visit: Payer: Self-pay

## 2019-10-07 ENCOUNTER — Other Ambulatory Visit (INDEPENDENT_AMBULATORY_CARE_PROVIDER_SITE_OTHER): Payer: 59

## 2019-10-07 DIAGNOSIS — M0579 Rheumatoid arthritis with rheumatoid factor of multiple sites without organ or systems involvement: Secondary | ICD-10-CM | POA: Diagnosis not present

## 2019-10-07 LAB — CBC WITH DIFFERENTIAL/PLATELET
Basophils Absolute: 0 10*3/uL (ref 0.0–0.1)
Basophils Relative: 0.2 % (ref 0.0–3.0)
Eosinophils Absolute: 0.1 10*3/uL (ref 0.0–0.7)
Eosinophils Relative: 0.5 % (ref 0.0–5.0)
HCT: 38.3 % (ref 36.0–46.0)
Hemoglobin: 12.4 g/dL (ref 12.0–15.0)
Lymphocytes Relative: 34.1 % (ref 12.0–46.0)
Lymphs Abs: 3.2 10*3/uL (ref 0.7–4.0)
MCHC: 32.4 g/dL (ref 30.0–36.0)
MCV: 88.4 fl (ref 78.0–100.0)
Monocytes Absolute: 0.9 10*3/uL (ref 0.1–1.0)
Monocytes Relative: 9.5 % (ref 3.0–12.0)
Neutro Abs: 5.2 10*3/uL (ref 1.4–7.7)
Neutrophils Relative %: 55.7 % (ref 43.0–77.0)
Platelets: 182 10*3/uL (ref 150.0–400.0)
RBC: 4.34 Mil/uL (ref 3.87–5.11)
RDW: 15.2 % (ref 11.5–15.5)
WBC: 9.4 10*3/uL (ref 4.0–10.5)

## 2019-10-07 LAB — COMPREHENSIVE METABOLIC PANEL
ALT: 15 U/L (ref 0–35)
AST: 14 U/L (ref 0–37)
Albumin: 3.8 g/dL (ref 3.5–5.2)
Alkaline Phosphatase: 73 U/L (ref 39–117)
BUN: 18 mg/dL (ref 6–23)
CO2: 32 mEq/L (ref 19–32)
Calcium: 8.8 mg/dL (ref 8.4–10.5)
Chloride: 100 mEq/L (ref 96–112)
Creatinine, Ser: 0.76 mg/dL (ref 0.40–1.20)
GFR: 78.68 mL/min (ref >60.00)
Glucose, Bld: 85 mg/dL (ref 70–99)
Potassium: 4.3 mEq/L (ref 3.5–5.1)
Sodium: 137 mEq/L (ref 135–145)
Total Bilirubin: 0.4 mg/dL (ref 0.2–1.2)
Total Protein: 6.5 g/dL (ref 6.0–8.3)

## 2019-10-07 LAB — SEDIMENTATION RATE: Sed Rate: 37 mm/hr — ABNORMAL HIGH (ref 0–30)

## 2019-10-10 ENCOUNTER — Other Ambulatory Visit: Payer: Self-pay

## 2019-10-10 ENCOUNTER — Other Ambulatory Visit (INDEPENDENT_AMBULATORY_CARE_PROVIDER_SITE_OTHER): Payer: 59

## 2019-10-10 ENCOUNTER — Other Ambulatory Visit: Payer: Self-pay | Admitting: Family Medicine

## 2019-10-10 DIAGNOSIS — M1990 Unspecified osteoarthritis, unspecified site: Secondary | ICD-10-CM | POA: Diagnosis not present

## 2019-10-10 DIAGNOSIS — M0579 Rheumatoid arthritis with rheumatoid factor of multiple sites without organ or systems involvement: Secondary | ICD-10-CM | POA: Diagnosis not present

## 2019-10-10 DIAGNOSIS — R350 Frequency of micturition: Secondary | ICD-10-CM

## 2019-10-10 DIAGNOSIS — Z79899 Other long term (current) drug therapy: Secondary | ICD-10-CM | POA: Diagnosis not present

## 2019-10-10 DIAGNOSIS — M797 Fibromyalgia: Secondary | ICD-10-CM | POA: Diagnosis not present

## 2019-10-10 LAB — URINALYSIS, ROUTINE W REFLEX MICROSCOPIC
Bilirubin Urine: NEGATIVE
Ketones, ur: NEGATIVE
Nitrite: NEGATIVE
Specific Gravity, Urine: 1.025 (ref 1.000–1.030)
Total Protein, Urine: NEGATIVE
Urine Glucose: NEGATIVE
Urobilinogen, UA: 0.2 (ref 0.0–1.0)
pH: 5.5 (ref 5.0–8.0)

## 2019-10-10 MED ORDER — SULFAMETHOXAZOLE-TRIMETHOPRIM 800-160 MG PO TABS: 1.0000 | ORAL_TABLET | Freq: Two times a day (BID) | ORAL | 0 refills | Status: AC

## 2019-10-10 MED FILL — SULFAMETHOXAZOLE-TMP DS TAB: 800-160 | 3 days supply | Qty: 6 | Fill #0

## 2019-10-10 MED FILL — FOLIC ACID 1 MG TABS: 1 | 90 days supply | Qty: 90 | Fill #0

## 2019-10-10 NOTE — Progress Notes (Signed)
Due to take MTX on Monday. No co-administer of this with bactrim. Will watch culture, but should be done with bactrim before mtx due.  Renal fx normal. Tolerated bactrim well in the past.  Orma Flaming, MD [place]

## 2019-10-11 LAB — URINE CULTURE
MICRO NUMBER:: 10240930
SPECIMEN QUALITY:: ADEQUATE

## 2019-10-21 ENCOUNTER — Ambulatory Visit (HOSPITAL_BASED_OUTPATIENT_CLINIC_OR_DEPARTMENT_OTHER): Payer: 59 | Admitting: Pharmacist

## 2019-10-21 ENCOUNTER — Other Ambulatory Visit: Payer: Self-pay

## 2019-10-21 ENCOUNTER — Telehealth: Payer: Self-pay | Admitting: Pharmacist

## 2019-10-21 DIAGNOSIS — Z7189 Other specified counseling: Secondary | ICD-10-CM

## 2019-10-21 MED ORDER — RINVOQ 15 MG PO TB24: 15.0000 mg | ORAL_TABLET | Freq: Every day | ORAL | 2 refills | Status: DC

## 2019-10-21 MED FILL — RINVOQ 15 MG TB24: 15 | 30 days supply | Qty: 30 | Fill #0

## 2019-10-21 NOTE — Telephone Encounter (Signed)
Called patient to schedule an appointment for the [facility]. I was unable to reach the patient so I left a HIPAA-compliant message requesting that the patient return my call.   

## 2019-10-21 NOTE — Progress Notes (Signed)
  S: Patient presents today for review of their specialty medication.   Patient is currently taking Rinvoq for rheumatoid arthritis. Patient is managed by Dr. Kathlene November for this. She recently stopped Humira.    Dosing:  Rheumatoid arthritis: Oral: 15 mg once daily.  Adherence: has not yet started   Efficacy: has not yet started   Monitoring: S/sx thromboembolism: none  S/sx malignancy: none  S/sx of infection: none   Current adverse effects: none    O:  Lab Results  Component Value Date   WBC 9.4 10/07/2019   HGB 12.4 10/07/2019   HCT 38.3 10/07/2019   MCV 88.4 10/07/2019   PLT 182.0 10/07/2019      Chemistry      Component Value Date/Time   NA 137 10/07/2019 0809   NA 141 05/03/2019 0000   K 4.3 10/07/2019 0809   CL 100 10/07/2019 0809   CO2 32 10/07/2019 0809   BUN 18 10/07/2019 0809   BUN 17 05/03/2019 0000   CREATININE 0.76 10/07/2019 0809   GLU 80 05/03/2019 0000      Component Value Date/Time   CALCIUM 8.8 10/07/2019 0809   ALKPHOS 73 10/07/2019 0809   AST 14 10/07/2019 0809   ALT 15 10/07/2019 0809   BILITOT 0.4 10/07/2019 0809       Lab Results  Component Value Date   CHOL 258 (H) 11/02/2018   HDL 87.80 11/02/2018   LDLCALC 147 (H) 11/02/2018   TRIG 113.0 11/02/2018   CHOLHDL 3 11/02/2018     A/P: 1. Medication review: patient currently prescribed Rinvoq for rheumatoid arthritis. Reviewed the medication with the patient, including the following: Rinvoq is a medication used to treat rheumatoid arthritis. Administer with or without food. Swallow tablet whole; do not crush, split, or chew. Possible adverse effects include increased risk of infection, GI upset, hematologic toxicity, hepatic effects, lipid abnormalities, increased risk of malignancy, thromboembolism. Avoid live vaccinations. No recommendations for any changes.  Benard Halsted, PharmD, [place] [phone]

## 2019-10-24 ENCOUNTER — Encounter: Payer: Self-pay | Admitting: Family Medicine

## 2019-10-24 ENCOUNTER — Ambulatory Visit: Payer: 59 | Admitting: Family Medicine

## 2019-10-24 ENCOUNTER — Ambulatory Visit: Payer: Self-pay

## 2019-10-24 ENCOUNTER — Other Ambulatory Visit: Payer: Self-pay

## 2019-10-24 VITALS — BP 120/78 | HR 58 | Ht 62.0 in | Wt 248.0 lb

## 2019-10-24 DIAGNOSIS — G8929 Other chronic pain: Secondary | ICD-10-CM | POA: Diagnosis not present

## 2019-10-24 DIAGNOSIS — M533 Sacrococcygeal disorders, not elsewhere classified: Secondary | ICD-10-CM | POA: Diagnosis not present

## 2019-10-24 NOTE — Patient Instructions (Addendum)
You had B SIJ injections today.  Call or go to the ER if you develop a large red swollen joint with extreme pain or oozing puss.   Pay attention to how it feels over the next few hours while the numbing medicine is working.   Keep me updated.  Recheck as needed.

## 2019-10-24 NOTE — Progress Notes (Signed)
Nancy [name] is a 54 y.o. female who presents to [place] at [facility] today for f/u of chronic low back pain request for SIJ injections.  She was last seen by Dr. Georgina Snell for her low back pain on 06/13/19 and most recently on 10/03/19 for B knee pain and B knee injections.  Since her last visit, pt reports that she is here for B SIJ injections that were recommended by rheumatology.  She states that her R SIJ is worse than the L and is also having some R sciatica that is running into her R glute.  She reports that her B knees have been feeling better since her last knee injections.    Pertinent review of systems: No fevers or chills  Relevant historical information: Rheumatoid arthritis   Exam:  BP 120/78 (BP Location: Left Arm, Patient Position: Sitting, Cuff Size: Large)   Pulse (!) 58   Ht 5\' 2"  (1.575 m)   Wt 248 lb (112.5 kg)   SpO2 98%   BMI 45.36 kg/m  General: Well Developed, well nourished, and in no acute distress.   MSK: Lumbosacral spine: Nontender midline.  Tender palpation bilateral SI joints right worse than left.    Lab and Radiology Results  Procedure: Real-time Ultrasound Guided Injection of right SI joint Device: Philips Affiniti 50G Images permanently stored and available for review in the ultrasound unit. Verbal informed consent obtained.  Discussed risks and benefits of procedure. Warned about infection bleeding damage to structures skin hypopigmentation and fat atrophy among others. Patient expresses understanding and agreement Time-out conducted.   Noted no overlying erythema, induration, or other signs of local infection.   Skin prepped in a sterile fashion.   Local anesthesia: Topical Ethyl chloride.   With sterile technique and under real time ultrasound guidance:  40 mg of Kenalog and 2 mL of Marcaine injected easily.   Completed without difficulty   Pain immediately resolved suggesting accurate placement of the  medication.   Advised to call if fevers/chills, erythema, induration, drainage, or persistent bleeding.   Images permanently stored and available for review in the ultrasound unit.  Impression: Technically successful ultrasound guided injection.     Procedure: Real-time Ultrasound Guided Injection of left SI joint Device: Philips Affiniti 50G Images permanently stored and available for review in the ultrasound unit. Verbal informed consent obtained.  Discussed risks and benefits of procedure. Warned about infection bleeding damage to structures skin hypopigmentation and fat atrophy among others. Patient expresses understanding and agreement Time-out conducted.   Noted no overlying erythema, induration, or other signs of local infection.   Skin prepped in a sterile fashion.   Local anesthesia: Topical Ethyl chloride.   With sterile technique and under real time ultrasound guidance:  40 mg of Kenalog and 2 mL of Marcaine injected easily.   Completed without difficulty   Pain immediately resolved suggesting accurate placement of the medication.   Advised to call if fevers/chills, erythema, induration, drainage, or persistent bleeding.   Images permanently stored and available for review in the ultrasound unit.  Impression: Technically successful ultrasound guided injection.       Assessment and Plan: 54 y.o. female with bilateral SI joint pain.  Patient had injections as above today.  She did have some benefit immediately following injections..  Continue core strengthening exercises and recheck back with me as needed.   PDMP not reviewed this encounter. Orders Placed This Encounter  Procedures  . [place] LIMITED JOINT SPACE STRUCTURES  LOW BILAT(NO LINKED CHARGES)    Order Specific Question:   Reason for Exam (SYMPTOM  OR DIAGNOSIS REQUIRED)    Answer:   B SI joint pain    Order Specific Question:   Preferred imaging location?    Answer:   [place]   No  orders of the defined types were placed in this encounter.    Discussed warning signs or symptoms. Please see discharge instructions. Patient expresses understanding.   The above documentation has been reviewed and is accurate and complete Lynne Leader

## 2019-10-29 MED FILL — METHOTREXATE SODIUM 2.5 MG: 2.5 | 30 days supply | Qty: 40 | Fill #0

## 2019-11-15 MED FILL — RINVOQ 15 MG TB24: 15 | 30 days supply | Qty: 30 | Fill #1

## 2019-12-02 MED FILL — METHOTREXATE SODIUM 2.5 MG: 2.5 | 30 days supply | Qty: 40 | Fill #0

## 2019-12-10 ENCOUNTER — Other Ambulatory Visit (INDEPENDENT_AMBULATORY_CARE_PROVIDER_SITE_OTHER): Payer: 59

## 2019-12-10 ENCOUNTER — Other Ambulatory Visit: Payer: Self-pay | Admitting: Physician Assistant

## 2019-12-10 ENCOUNTER — Other Ambulatory Visit: Payer: Self-pay

## 2019-12-10 DIAGNOSIS — M0579 Rheumatoid arthritis with rheumatoid factor of multiple sites without organ or systems involvement: Secondary | ICD-10-CM | POA: Diagnosis not present

## 2019-12-10 LAB — CBC WITH DIFFERENTIAL/PLATELET
Basophils Absolute: 0 10*3/uL (ref 0.0–0.1)
Basophils Relative: 0.2 % (ref 0.0–3.0)
Eosinophils Absolute: 0 10*3/uL (ref 0.0–0.7)
Eosinophils Relative: 0.6 % (ref 0.0–5.0)
HCT: 38.6 % (ref 36.0–46.0)
Hemoglobin: 12.7 g/dL (ref 12.0–15.0)
Lymphocytes Relative: 43.8 % (ref 12.0–46.0)
Lymphs Abs: 2.2 10*3/uL (ref 0.7–4.0)
MCHC: 32.9 g/dL (ref 30.0–36.0)
MCV: 89.4 fl (ref 78.0–100.0)
Monocytes Absolute: 0.6 10*3/uL (ref 0.1–1.0)
Monocytes Relative: 12.4 % — ABNORMAL HIGH (ref 3.0–12.0)
Neutro Abs: 2.2 10*3/uL (ref 1.4–7.7)
Neutrophils Relative %: 43 % (ref 43.0–77.0)
Platelets: 164 10*3/uL (ref 150.0–400.0)
RBC: 4.32 Mil/uL (ref 3.87–5.11)
RDW: 15.6 % — ABNORMAL HIGH (ref 11.5–15.5)
WBC: 5.1 10*3/uL (ref 4.0–10.5)

## 2019-12-10 LAB — COMPREHENSIVE METABOLIC PANEL
ALT: 25 U/L (ref 0–35)
AST: 22 U/L (ref 0–37)
Albumin: 4 g/dL (ref 3.5–5.2)
Alkaline Phosphatase: 52 U/L (ref 39–117)
BUN: 19 mg/dL (ref 6–23)
CO2: 31 mEq/L (ref 19–32)
Calcium: 9.1 mg/dL (ref 8.4–10.5)
Chloride: 100 mEq/L (ref 96–112)
Creatinine, Ser: 0.79 mg/dL (ref 0.40–1.20)
GFR: 75.2 mL/min (ref >60.00)
Glucose, Bld: 85 mg/dL (ref 70–99)
Potassium: 4 mEq/L (ref 3.5–5.1)
Sodium: 138 mEq/L (ref 135–145)
Total Bilirubin: 0.4 mg/dL (ref 0.2–1.2)
Total Protein: 6.4 g/dL (ref 6.0–8.3)

## 2019-12-10 LAB — SEDIMENTATION RATE: Sed Rate: 34 mm/hr — ABNORMAL HIGH (ref 0–30)

## 2019-12-12 DIAGNOSIS — M797 Fibromyalgia: Secondary | ICD-10-CM | POA: Diagnosis not present

## 2019-12-12 DIAGNOSIS — M1990 Unspecified osteoarthritis, unspecified site: Secondary | ICD-10-CM | POA: Diagnosis not present

## 2019-12-12 DIAGNOSIS — M0579 Rheumatoid arthritis with rheumatoid factor of multiple sites without organ or systems involvement: Secondary | ICD-10-CM | POA: Diagnosis not present

## 2019-12-12 DIAGNOSIS — Z79899 Other long term (current) drug therapy: Secondary | ICD-10-CM | POA: Diagnosis not present

## 2019-12-17 MED FILL — RINVOQ 15 MG TB24: 15 | 30 days supply | Qty: 30 | Fill #2

## 2019-12-23 NOTE — Patient Instructions (Signed)
Health Maintenance Due  Topic Date Due  . COVID-19 Vaccine (1) Never done   Depression screen [facility] 2/9 11/01/2018  Decreased Interest 0  Down, Depressed, Hopeless 0  PHQ - 2 Score 0  Some encounter information is confidential and restricted. Go to Review Flowsheets activity to see all data.

## 2019-12-23 NOTE — Progress Notes (Signed)
Phone: [phone]   Subjective:  Patient presents today for their annual physical. Chief complaint-noted.   See problem oriented charting- Review of Systems  Constitutional: Negative for chills and fever.  HENT: Negative for ear pain, hearing loss and tinnitus.   Eyes: Negative for blurred vision and double vision.  Respiratory: Negative for cough, shortness of breath and wheezing.   Cardiovascular: Positive for palpitations. Negative for chest pain and leg swelling.  Gastrointestinal: Negative for heartburn, nausea and vomiting.  Genitourinary: Negative for dysuria, frequency and urgency.  Musculoskeletal: Positive for joint pain and myalgias. Negative for back pain and neck pain.  Skin: Negative for rash.  Neurological: Negative for dizziness, weakness and headaches.  Endo/Heme/Allergies: Does not bruise/bleed easily.  Psychiatric/Behavioral: Negative for depression, memory loss and suicidal ideas. The patient does not have insomnia.    The following were reviewed and entered/updated in epic: Past Medical History:  Diagnosis Date  . Anemia   . Asthma   . Bradycardia   . Chicken pox   . Dysrhythmia    occ. palpitations  . Fibromyalgia   . Headache(784.0)   . Migraines   . Neck pain    bulging disk  . Polyarthralgia 02/21/2019  . PONV (postoperative nausea and vomiting)    sometimes wakes up after surgery and cannot breath due to asthma, can be bradycardic  . Rheumatoid arthritis involving multiple sites with positive rheumatoid factor ([place]) 02/21/2019  . Shortness of breath    with bradycardia   Patient Active Problem List   Diagnosis Date Noted  . Immunosuppressed status ([place]) 12/26/2019    Priority: High  . Rheumatoid arthritis involving multiple sites with positive rheumatoid factor ([place]) 02/21/2019    Priority: High  . Fibromyalgia 04/12/2018    Priority: High  . Hyperlipidemia 12/25/2019    Priority: Medium  . Vaginal dryness 04/12/2018    Priority: Medium    . Morbid obesity ([place]) 04/12/2018    Priority: Medium  . History of colon polyps 04/12/2018    Priority: Medium  . Primary osteoarthritis of both knees 12/28/2017    Priority: Medium  . Mild intermittent asthma 11/20/2013    Priority: Medium  . Bradycardia 07/04/2012    Priority: Medium  . Vitamin B12 deficiency 02/14/2019    Priority: Low  . Vitamin D deficiency 04/12/2018    Priority: Low  . History of total hysterectomy 04/12/2018    Priority: Low  . Insomnia 04/12/2018    Priority: Low  . Cervicogenic headache 03/26/2014    Priority: Low  . Cervical radiculopathy 03/26/2014    Priority: Low  . Urge incontinence 12/20/2018  . Urinary frequency 12/20/2018   Past Surgical History:  Procedure Laterality Date  . ABDOMINAL HYSTERECTOMY    . BACK SURGERY     cervical fusion C5,6,7 with a bulge at C4  . BLADDER SURGERY  July 10th 2013  . [place]  . CYSTOSCOPY N/A 07/31/2018   Procedure: CYSTOSCOPY;  Surgeon: Bjorn Loser, MD;  Location: [facility];  Service: Urology;  Laterality: N/A;  . DIAGNOSTIC LAPAROSCOPY    . EXCISION OF MESH N/A 07/31/2018   Procedure: REMOVAL OF VAGINAL MESH;  Surgeon: Bjorn Loser, MD;  Location: [facility];  Service: Urology;  Laterality: N/A;  . LAPAROSCOPY Bilateral 05/31/2013   Procedure: LAPAROSCOPY withbilateral salpingo-oophorectomy, lysis of adhesions;  Surgeon: Allena Katz, MD;  Location: [facility];  Service: Gynecology;  Laterality: Bilateral;  with clippings of Vaginal  mesh  .  SALPINGOOPHORECTOMY Bilateral 05/31/2013   Procedure: BILATERAL SALPINGO OOPHORECTOMY/TRIM MESH;  Surgeon: Allena Katz, MD;  Location: [facility];  Service: Gynecology;  Laterality: Bilateral;  . TUBAL LIGATION     1995  . VAGINAL HYSTERECTOMY     1998 or 1997  . WRIST FRACTURE SURGERY  2005    Family History  Problem Relation Age of Onset  . Hyperlipidemia Mother        32 in 2019  . Arthritis Mother   . Osteoporosis  Mother   . Lumbar disc disease Mother        herniated discs.   Marland Kitchen COPD Mother   . Hypertension Father        doesnt know his history well- no recent contact  . Heart disease Maternal Grandmother   . Diabetes Maternal Grandmother   . Cervical cancer Maternal Grandmother   . Heart disease Maternal Grandfather   . Cancer - Colon Maternal Grandfather   . Diabetes Paternal Grandmother   . Lung cancer Paternal Grandmother        dad's parents stepped in to help when dad not present  . Other Sister        doesnt know history well  . Lumbar disc disease Brother        grew up with him   . Atrial fibrillation Paternal Grandfather        required a few shocks  . Bradycardia Paternal Grandfather   . Hypertension Paternal Grandfather   . Other Brother        doesnt know history well    Medications- reviewed and updated Current Outpatient Medications  Medication Sig Dispense Refill  . acetaminophen (TYLENOL) 500 MG tablet Take 500 mg by mouth every 6 (six) hours as needed (pain).     . Albuterol Sulfate (PROAIR RESPICLICK) [id number] (90 Base) MCG/ACT AEPB Inhale 1 puff into the lungs every 6 (six) hours as needed. 1 each 3  . Cholecalciferol (VITAMIN D3) 50 MCG (2000 UT) CHEW Chew by mouth.    . cyanocobalamin 1000 MCG tablet Take 1,000 mcg by mouth daily.    . Estradiol 10 MCG TABS vaginal tablet Place 1 tablet (10 mcg total) vaginally 2 (two) times a week. 24 tablet 3  . folic acid (FOLVITE) A999333 MCG tablet Take 400 mcg by mouth daily.    . furosemide (LASIX) 20 MG tablet TAKE 1 TABLET BY MOUTH DAILY AS NEEDED FOR FLUID OR EDEMA (UP TO 3 DAYS). 30 tablet 1  . gabapentin (NEURONTIN) 300 MG capsule Take 1 capsule (300 mg total) by mouth 3 (three) times daily. 270 capsule 3  . hydrochlorothiazide (HYDRODIURIL) 25 MG tablet Take 1 tablet (25 mg total) by mouth daily as needed (fluid retention/edema). TAKE 1 TABLET BY MOUTH ONCE DAILY AS NEEDED FOR EDEMA 90 tablet 1  . methotrexate 2.5 MG tablet       . ondansetron (ZOFRAN) 4 MG tablet Take 1 tablet (4 mg total) by mouth every 8 (eight) hours as needed for nausea or vomiting. 20 tablet 0  . phentermine (ADIPEX-P) 37.5 MG tablet Take 1 tablet (37.5 mg total) by mouth daily before breakfast. 30 tablet 2  . predniSONE (DELTASONE) 10 MG tablet     . tiZANidine (ZANAFLEX) 4 MG tablet Take 1 tablet (4 mg total) by mouth every 8 (eight) hours as needed for muscle spasms. 30 tablet 1  . traMADol (ULTRAM) 50 MG tablet Take one-two tablets every 4-6 hours as needed for moderate pain. [place]  tablet 0  . Turmeric 500 MG CAPS Take by mouth 2 (two) times daily.    Marland Kitchen Upadacitinib ER (RINVOQ) 15 MG TB24 Take 15 mg by mouth daily. 30 tablet 2  . valACYclovir (VALTREX) 1000 MG tablet Take two tablets ( total 2000 mg) by mouth q12h x 1 day; Start: ASAP after symptom onset 6 tablet 2  . vitamin C (ASCORBIC ACID) 500 MG tablet Take 500 mg by mouth as needed.     No current facility-administered medications for this visit.    Allergies-reviewed and updated Allergies  Allergen Reactions  . Advair Diskus [Fluticasone-Salmeterol] Cough    excessive  . Erythromycin Diarrhea and Nausea And Vomiting  . Percocet [Oxycodone-Acetaminophen] Other (See Comments)    hallucinations    Social History   Social History Narrative   Married to husband Shanon Brow. Son Gerald Stabs and Daughter Reggy Eye.       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       Hobbies: movies, reading, hiking   Objective  Objective:  BP 128/60   Pulse 60   Temp 97.9 F (36.6 C) (Temporal)   Ht 5\' 2"  (1.575 m)   Wt 247 lb (112 kg)   SpO2 97%   BMI 45.18 kg/m  Gen: NAD, resting comfortably HEENT: Mucous membranes are moist. Oropharynx normal Neck: no thyromegaly CV: RRR no murmurs rubs or gallops Lungs: CTAB no crackles, wheeze, rhonchi Abdomen: soft/nontender/nondistended/normal bowel sounds. No rebound or guarding.  Obesity noted Ext: no edema Skin: warm, dry Neuro: grossly normal, moves all  extremities, PERRLA   Assessment and Plan   54 y.o. female presenting for annual physical.  Health Maintenance counseling: 1. Anticipatory guidance: Patient counseled regarding regular dental exams-q6 months who recently had to have 2 front teeth replaced-that went well, eye exams ,  avoiding smoking and second hand smoke , limiting alcohol to 1 beverage per day-rare social.   2. Risk factor reduction:  Advised patient of need for regular exercise and diet rich and fruits and vegetables to reduce risk of heart attack and stroke. Exercise- plans to start back soon. Thinks she will do better after she can get back in to gym-considering [facility]. Diet-. Plans to cut back on carbs and cheese.  We also discussed calorie counting-she has had some success with weight watchers in the past and could reconsider if needed-thankfully only up 3 pounds from last physical despite pandemic.  Morbid obesity status noted and we discussed the importance of weight loss Wt Readings from Last 3 Encounters:  12/26/19 247 lb (112 kg)  10/24/19 248 lb (112.5 kg)  10/03/19 251 lb (113.9 kg)  3. Immunizations/screenings/ancillary studies-fully vaccinated against COVID-19 in December and Rolley Sims is going to get [place] the dates. -Interestingly her insurance will not pay for Shingrix until age 18 -Ask rheumatology about Prevnar 13 and Pneumovax 23 Immunization History  Administered Date(s) Administered  . Influenza,inj,Quad PF,6+ Mos 04/24/2018  . Influenza-Unspecified 05/02/2015, 05/04/2016, 05/08/2017, 05/03/2019  . Tdap 08/26/2015  4. Cervical cancer screening-hysterectomy for benign reasons-still follows with Dr. Talbert Nan 5. Breast cancer screening-  breast exam with Dr. Talbert Nan of gynecology and mammogram 08/15/2019 3D 6. Colon cancer screening -November 27, 2015 with 5-year repeat so she will be due early next year due to history of sessile serrated polyp 7. Skin cancer screening-no dermatologist.  Advised regular  sunscreen use. Denies worrisome, changing, or new skin lesions.  8. Birth control/STD check-hysterectomy monogamous 9. Osteoporosis screening at 63-he actually opted to go ahead and update  this given postmenopausal and intermittent prednisone use -Never smoker  Status of chronic or acute concerns    Rheumatoid arthritis involving multiple sites with positive rheumatoid factor ([place]) Patient reports significant improvement on Rinvoq along with her methotrexate.  She is off prednisone at present.  Has close follow-up with rheumatology.Rinvoq increases clotting risk for DVT PE-patient had asked rheumatology about starting aspirin and they did not strongly recommend that she also asked my opinion and I told her I thought was reasonable to monitor for signs and symptoms of DVT especially with her clinical background -Immunosuppressed status noted. -Considering Prevnar/Pneumovax-I am to have her discuss with rheumatology  Fibromyalgia Continues to do well with gabapentin 300 mg TID.  Also still requiring tramadol typically twice a day and occasionally 3 times a day-this will be refilled today.  She will sign a controlled substance contract today.  Vaginal dryness Remains on estradiol through gynecology  Primary osteoarthritis of both knees Patient reports has upcoming injections in her knees next week-weight loss would certainly be beneficial-we spent a fair amount of time discussing importance of this as well as challenges   # B12 deficiency S: Current treatment/medication (oral vs. IM): Cyanocobalamin 1000MCG Lab Results  Component Value Date   VITAMINB12 291 12/26/2019  A/P: Low normal but adequately treated with orals-continue current medication  #Vitamin D deficiency S: Medication: Vitamin D3 2000 units daily Last vitamin D Lab Results  Component Value Date   VD25OH 27.37 (L) 12/26/2019   A/P: We will treat with 50,000 unit week boost for 12 weeks and then increase underlying vitamin  D to 4000 units/day  #Mild intermittent asthma-no issues recently-has not had to use inhaler  #Hyperlipidemia-lipids elevated more this year and apparently that is a side effect of Rinvoq per patient-regardless 10-year ASCVD risk only 1.9% and we will remain off medications  #Bradycardia-has seen Dr. [place] back in 2013.  Heart monitor showed intermittent PVCs and PACs and patient does still have some palpitations.  Recommended follow up: 1 year physical or sooner if needed Future Appointments  Date Time Provider [place]  01/02/2020  3:00 PM Gregor Hams, MD [facility] None   Lab/Order associations: fasting when labs were done   ICD-10-CM   1. Preventative health care  Z00.00   2. Rheumatoid arthritis involving multiple sites with positive rheumatoid factor (H[facility])  M05.79   3. Immunosuppressed status ([place])  D84.9   4. Morbid obesity ([place])  E66.01   5. Vitamin D deficiency  E55.9 VITAMIN D 25 Hydroxy (Vit-D Deficiency, Fractures)  6. Vitamin B12 deficiency  E53.8 Vitamin B12  7. Hyperlipidemia, unspecified hyperlipidemia type  E78.5 TSH    Lipid panel  8. Fibromyalgia  M79.7   9. Vaginal dryness  N89.8   10. Primary osteoarthritis of both knees  M17.0   11. Bradycardia  R00.1   12. Postmenopausal  Z78.0 DG Bone Density   Return precautions advised.  Garret Reddish, MD

## 2019-12-25 ENCOUNTER — Other Ambulatory Visit: Payer: Self-pay

## 2019-12-25 DIAGNOSIS — E785 Hyperlipidemia, unspecified: Secondary | ICD-10-CM | POA: Insufficient documentation

## 2019-12-25 DIAGNOSIS — E538 Deficiency of other specified B group vitamins: Secondary | ICD-10-CM

## 2019-12-25 DIAGNOSIS — E559 Vitamin D deficiency, unspecified: Secondary | ICD-10-CM

## 2019-12-26 ENCOUNTER — Other Ambulatory Visit: Payer: Self-pay

## 2019-12-26 ENCOUNTER — Ambulatory Visit (INDEPENDENT_AMBULATORY_CARE_PROVIDER_SITE_OTHER): Payer: 59 | Admitting: Family Medicine

## 2019-12-26 ENCOUNTER — Other Ambulatory Visit: Payer: Self-pay | Admitting: Family Medicine

## 2019-12-26 ENCOUNTER — Encounter: Payer: Self-pay | Admitting: Family Medicine

## 2019-12-26 VITALS — BP 128/60 | HR 60 | Temp 97.9°F | Ht 62.0 in | Wt 247.0 lb

## 2019-12-26 DIAGNOSIS — M797 Fibromyalgia: Secondary | ICD-10-CM

## 2019-12-26 DIAGNOSIS — M0579 Rheumatoid arthritis with rheumatoid factor of multiple sites without organ or systems involvement: Secondary | ICD-10-CM | POA: Diagnosis not present

## 2019-12-26 DIAGNOSIS — E538 Deficiency of other specified B group vitamins: Secondary | ICD-10-CM | POA: Diagnosis not present

## 2019-12-26 DIAGNOSIS — E785 Hyperlipidemia, unspecified: Secondary | ICD-10-CM

## 2019-12-26 DIAGNOSIS — N898 Other specified noninflammatory disorders of vagina: Secondary | ICD-10-CM

## 2019-12-26 DIAGNOSIS — Z0000 Encounter for general adult medical examination without abnormal findings: Secondary | ICD-10-CM

## 2019-12-26 DIAGNOSIS — D849 Immunodeficiency, unspecified: Secondary | ICD-10-CM | POA: Diagnosis not present

## 2019-12-26 DIAGNOSIS — Z780 Asymptomatic menopausal state: Secondary | ICD-10-CM

## 2019-12-26 DIAGNOSIS — E559 Vitamin D deficiency, unspecified: Secondary | ICD-10-CM | POA: Diagnosis not present

## 2019-12-26 DIAGNOSIS — M170 Bilateral primary osteoarthritis of knee: Secondary | ICD-10-CM

## 2019-12-26 DIAGNOSIS — R001 Bradycardia, unspecified: Secondary | ICD-10-CM

## 2019-12-26 LAB — LIPID PANEL
Cholesterol: 257 mg/dL — ABNORMAL HIGH (ref 0–200)
HDL: 87.8 mg/dL (ref >39.00)
LDL Cholesterol: 147 mg/dL — ABNORMAL HIGH (ref 0–99)
NonHDL: 169.54
Total CHOL/HDL Ratio: 3
Triglycerides: 111 mg/dL (ref 0.0–149.0)
VLDL: 22.2 mg/dL (ref 0.0–40.0)

## 2019-12-26 LAB — VITAMIN B12: Vitamin B-12: 291 pg/mL (ref 211–911)

## 2019-12-26 LAB — VITAMIN D 25 HYDROXY (VIT D DEFICIENCY, FRACTURES): VITD: 27.37 ng/mL — ABNORMAL LOW (ref 30.00–100.00)

## 2019-12-26 LAB — TSH: TSH: 1.47 u[IU]/mL (ref 0.35–4.50)

## 2019-12-26 MED ORDER — GABAPENTIN 300 MG PO CAPS: 300.0000 mg | ORAL_CAPSULE | Freq: Three times a day (TID) | ORAL | 3 refills | Status: DC

## 2019-12-26 MED ORDER — TRAMADOL HCL 50 MG PO TABS: ORAL_TABLET | ORAL | 1 refills | Status: DC

## 2019-12-26 MED ORDER — FUROSEMIDE 20 MG PO TABS: ORAL_TABLET | ORAL | 1 refills | Status: DC

## 2019-12-26 MED ORDER — HYDROCHLOROTHIAZIDE 25 MG PO TABS: 25.0000 mg | ORAL_TABLET | Freq: Every day | ORAL | 1 refills | Status: DC | PRN

## 2019-12-26 NOTE — Assessment & Plan Note (Addendum)
Patient reports significant improvement on Rinvoq along with her methotrexate.  She is off prednisone at present.  Has close follow-up with rheumatology.Rinvoq increases clotting risk for DVT PE-patient had asked rheumatology about starting aspirin and they did not strongly recommend that she also asked my opinion and I told her I thought was reasonable to monitor for signs and symptoms of DVT especially with her clinical background -Immunosuppressed status noted. -Considering Prevnar/Pneumovax-I am to have her discuss with rheumatology

## 2019-12-26 NOTE — Assessment & Plan Note (Signed)
Patient reports has upcoming injections in her knees next week-weight loss would certainly be beneficial-we spent a fair amount of time discussing importance of this as well as challenges

## 2019-12-26 NOTE — Assessment & Plan Note (Addendum)
Continues to do well with gabapentin 300 mg TID.  Also still requiring tramadol typically twice a day and occasionally 3 times a day-this will be refilled today.  She will sign a controlled substance contract today.

## 2019-12-26 NOTE — Assessment & Plan Note (Signed)
Remains on estradiol through gynecology

## 2019-12-27 MED FILL — HYDROCHLOROTHIAZIDE 25 MG T: 25 | 90 days supply | Qty: 90 | Fill #0

## 2019-12-27 MED FILL — traMADol HCL 50 MG TABS: 50 | 23 days supply | Qty: 180 | Fill #0

## 2019-12-27 MED FILL — FUROSEMIDE 20 MG TABS: 20 | 30 days supply | Qty: 30 | Fill #0

## 2019-12-27 MED FILL — GABAPENTIN 300 MG CAPSULE: 300 | 90 days supply | Qty: 270 | Fill #0

## 2019-12-31 ENCOUNTER — Other Ambulatory Visit: Payer: Self-pay

## 2019-12-31 MED ORDER — LEVOCETIRIZINE DIHYDROCHLORIDE 5 MG PO TABS: 5.0000 mg | ORAL_TABLET | Freq: Every evening | ORAL | 1 refills | Status: DC

## 2019-12-31 MED FILL — LEVOCETIRIZINE 5 MG TABLET: 5 | 90 days supply | Qty: 90 | Fill #0

## 2020-01-02 ENCOUNTER — Ambulatory Visit: Payer: Self-pay

## 2020-01-02 ENCOUNTER — Encounter: Payer: Self-pay | Admitting: Family Medicine

## 2020-01-02 ENCOUNTER — Other Ambulatory Visit: Payer: Self-pay

## 2020-01-02 ENCOUNTER — Ambulatory Visit: Payer: 59 | Admitting: Family Medicine

## 2020-01-02 VITALS — BP 114/80 | HR 53 | Ht 62.0 in | Wt 250.4 lb

## 2020-01-02 DIAGNOSIS — G8929 Other chronic pain: Secondary | ICD-10-CM | POA: Diagnosis not present

## 2020-01-02 DIAGNOSIS — M25561 Pain in right knee: Secondary | ICD-10-CM | POA: Diagnosis not present

## 2020-01-02 DIAGNOSIS — M25562 Pain in left knee: Secondary | ICD-10-CM | POA: Diagnosis not present

## 2020-01-02 NOTE — Progress Notes (Signed)
I, [name], LAT, ATC, am serving as scribe for Dr. Lynne Leader.  Nancy [name] is a 54 y.o. female who presents to [place] at [facility] today for f/u of B knee pain.  She was last seen by Dr. Georgina Snell for her knees on 10/03/19 and had B knee injections.  She has been taking IBU and Tramadol.  Since her last visit, pt reports worsening B knee pain over the past month for the L knee and the past week for the R knee pain.  She denies much swelling in her knees, mainly just pain.  She reports worsening pain mainly w/ stairs and prolonged standing /walking.     Exam:  BP 114/80 (BP Location: Right Arm, Patient Position: Sitting, Cuff Size: Large)   Pulse (!) 53   Ht 5\' 2"  (1.575 m)   Wt 250 lb 6.4 oz (113.6 kg)   SpO2 97%   BMI 45.80 kg/m  General: Well Developed, well nourished, and in no acute distress.   MSK: Knees bilaterally with moderate effusion no erythema.  Nontender.  Decreased range of motion.    Lab and Radiology Results  Procedure: Real-time Ultrasound Guided Injection of right knee lateral superior patellar space Device: Philips Affiniti 50G Images permanently stored and available for review in the ultrasound unit. Verbal informed consent obtained.  Discussed risks and benefits of procedure. Warned about infection bleeding damage to structures skin hypopigmentation and fat atrophy among others. Patient expresses understanding and agreement Time-out conducted.   Noted no overlying erythema, induration, or other signs of local infection.   Skin prepped in a sterile fashion.   Local anesthesia: Topical Ethyl chloride.   With sterile technique and under real time ultrasound guidance:  40 mg of Kenalog and 3 mL of Marcaine injected easily.   Completed without difficulty   Pain immediately resolved suggesting accurate placement of the medication.   Advised to call if fevers/chills, erythema, induration, drainage, or persistent bleeding.   Images  permanently stored and available for review in the ultrasound unit.  Impression: Technically successful ultrasound guided injection.   Procedure: Real-time Ultrasound Guided Injection of left knee lateral superior patellar space Device: Philips Affiniti 50G Images permanently stored and available for review in the ultrasound unit. Verbal informed consent obtained.  Discussed risks and benefits of procedure. Warned about infection bleeding damage to structures skin hypopigmentation and fat atrophy among others. Patient expresses understanding and agreement Time-out conducted.   Noted no overlying erythema, induration, or other signs of local infection.   Skin prepped in a sterile fashion.   Local anesthesia: Topical Ethyl chloride.   With sterile technique and under real time ultrasound guidance:  40 mg of Kenalog and 3 mL of Marcaine injected easily.   Completed without difficulty   Pain immediately resolved suggesting accurate placement of the medication.   Advised to call if fevers/chills, erythema, induration, drainage, or persistent bleeding.   Images permanently stored and available for review in the ultrasound unit.  Impression: Technically successful ultrasound guided injection.     Assessment and Plan: 54 y.o. female with bilateral knee pain due to DJD.  Plan for bilateral injections with steroid.  Also work on authorization of hyaluronic acid injections.  Of note medical decision making for this injection is already happened.  No charge for today's visit however procedure abilities.   PDMP not reviewed this encounter. Orders Placed This Encounter  Procedures  . [place] LIMITED JOINT SPACE STRUCTURES LOW BILAT(NO LINKED CHARGES)  Order Specific Question:   Reason for Exam (SYMPTOM  OR DIAGNOSIS REQUIRED)    Answer:   B knee pain    Order Specific Question:   Preferred imaging location?    Answer:   [facility]   No orders of the defined types were  placed in this encounter.    Discussed warning signs or symptoms. Please see discharge instructions. Patient expresses understanding.   The above documentation has been reviewed and is accurate and complete Lynne Leader, M.D.

## 2020-01-02 NOTE — Patient Instructions (Signed)
Thank you for coming in today. Call or go to the ER if you develop a large red swollen joint with extreme pain or oozing puss.  We will authorize the gel shots.  Recheck with me as needed.

## 2020-01-08 ENCOUNTER — Other Ambulatory Visit: Payer: Self-pay | Admitting: Pharmacist

## 2020-01-08 ENCOUNTER — Encounter: Payer: Self-pay | Admitting: Family Medicine

## 2020-01-08 DIAGNOSIS — S43402A Unspecified sprain of left shoulder joint, initial encounter: Secondary | ICD-10-CM | POA: Diagnosis not present

## 2020-01-08 DIAGNOSIS — S42255A Nondisplaced fracture of greater tuberosity of left humerus, initial encounter for closed fracture: Secondary | ICD-10-CM | POA: Diagnosis not present

## 2020-01-08 MED ORDER — RINVOQ 15 MG PO TB24: 15.0000 mg | ORAL_TABLET | Freq: Every day | ORAL | 1 refills | Status: DC

## 2020-01-09 ENCOUNTER — Other Ambulatory Visit: Payer: Self-pay

## 2020-01-09 DIAGNOSIS — S4292XA Fracture of left shoulder girdle, part unspecified, initial encounter for closed fracture: Secondary | ICD-10-CM

## 2020-01-14 ENCOUNTER — Other Ambulatory Visit: Payer: Self-pay

## 2020-01-14 ENCOUNTER — Ambulatory Visit (INDEPENDENT_AMBULATORY_CARE_PROVIDER_SITE_OTHER): Payer: 59 | Admitting: Family Medicine

## 2020-01-14 ENCOUNTER — Encounter: Payer: Self-pay | Admitting: Family Medicine

## 2020-01-14 DIAGNOSIS — S42255A Nondisplaced fracture of greater tuberosity of left humerus, initial encounter for closed fracture: Secondary | ICD-10-CM

## 2020-01-14 NOTE — Progress Notes (Signed)
Office Visit Note   Patient: Nancy [name]           Date of Birth: 1966/03/19           MRN: [id number] Visit Date: 01/14/2020 Requested by: Marin Olp, MD [place],  [place] [address] PCP: Marin Olp, MD  Subjective: Chief Complaint  Patient presents with  . Left Shoulder - Fracture    Fell while bowling, DOI 01/08/20, went UC in [place], [place] done and placed in a sling, taking Ibuprofen 600mg  and tramadol 50mg     HPI: She is here with left shoulder fracture.  On June 9 she was in [place] visiting her daughter.  She was bowling and slipped and fell landing directly on her shoulder and her knee.  Her knee seems to be doing fine but her shoulder hurt severely immediately.  She later went to urgent care where x-rays revealed a nondisplaced greater tuberosity avulsion fracture.  She was placed in a sling and now presents for evaluation.  Her pain has improved.  She is right-hand dominant.  No previous problems with her shoulder.  She is using ibuprofen and tramadol as needed.                ROS: She has a history of vitamin D deficiency.  All other systems were reviewed and are negative.  Objective: Vital Signs: There were no vitals taken for this visit.  Physical Exam:  General:  Alert and oriented, in no acute distress. Pulm:  Breathing unlabored. Psy:  Normal mood, congruent affect. Skin: There is ecchymosis on the anterior shoulder into the biceps area. Left shoulder: She has tenderness to palpation anteriorly and laterally.  No tenderness over the [facility] joint or the clavicle.  Mild tenderness over the posterior scapula.  Elbow range of motion is good, wrist and finger range of motion normal.  No tenderness around the elbow, wrist or hand.  Normal sensation.  Imaging: She brought x-rays on a CD but they did not come with DICOM software.  I was unable to view them.  Assessment & Plan: 1.  6 days status post fall with left shoulder nondisplaced  greater tuberosity avulsion fracture -Pendulum exercises, new shoulder sling.  Return in about 10 days for repeat shoulder x-rays.  Once adequate callus formation is present, we will discontinue the sling and start working on range of motion and eventually strengthening in physical therapy.     Procedures: No procedures performed  No notes on file     PMFS History: Patient Active Problem List   Diagnosis Date Noted  . Immunosuppressed status ([place]) 12/26/2019  . Hyperlipidemia 12/25/2019  . Rheumatoid arthritis involving multiple sites with positive rheumatoid factor ([place]) 02/21/2019  . Vitamin B12 deficiency 02/14/2019  . Urge incontinence 12/20/2018  . Urinary frequency 12/20/2018  . Fibromyalgia 04/12/2018  . Vitamin D deficiency 04/12/2018  . Vaginal dryness 04/12/2018  . History of total hysterectomy 04/12/2018  . Insomnia 04/12/2018  . Morbid obesity ([place]) 04/12/2018  . History of colon polyps 04/12/2018  . Primary osteoarthritis of both knees 12/28/2017  . Cervicogenic headache 03/26/2014  . Cervical radiculopathy 03/26/2014  . Mild intermittent asthma 11/20/2013  . Bradycardia 07/04/2012   Past Medical History:  Diagnosis Date  . Anemia   . Asthma   . Bradycardia   . Chicken pox   . Dysrhythmia    occ. palpitations  . Fibromyalgia   . Headache(784.0)   . Migraines   .  Neck pain    bulging disk  . Polyarthralgia 02/21/2019  . PONV (postoperative nausea and vomiting)    sometimes wakes up after surgery and cannot breath due to asthma, can be bradycardic  . Rheumatoid arthritis involving multiple sites with positive rheumatoid factor ([place]) 02/21/2019  . Shortness of breath    with bradycardia    Family History  Problem Relation Age of Onset  . Hyperlipidemia Mother        3 in 2019  . Arthritis Mother   . Osteoporosis Mother   . Lumbar disc disease Mother        herniated discs.   Marland Kitchen COPD Mother   . Hypertension Father        doesnt know his history  well- no recent contact  . Heart disease Maternal Grandmother   . Diabetes Maternal Grandmother   . Cervical cancer Maternal Grandmother   . Heart disease Maternal Grandfather   . Cancer - Colon Maternal Grandfather   . Diabetes Paternal Grandmother   . Lung cancer Paternal Grandmother        dad's parents stepped in to help when dad not present  . Other Sister        doesnt know history well  . Lumbar disc disease Brother        grew up with him   . Atrial fibrillation Paternal Grandfather        required a few shocks  . Bradycardia Paternal Grandfather   . Hypertension Paternal Grandfather   . Other Brother        doesnt know history well    Past Surgical History:  Procedure Laterality Date  . ABDOMINAL HYSTERECTOMY    . BACK SURGERY     cervical fusion C5,6,7 with a bulge at C4  . BLADDER SURGERY  July 10th 2013  . [place]  . CYSTOSCOPY N/A 07/31/2018   Procedure: CYSTOSCOPY;  Surgeon: Bjorn Loser, MD;  Location: [facility];  Service: Urology;  Laterality: N/A;  . DIAGNOSTIC LAPAROSCOPY    . EXCISION OF MESH N/A 07/31/2018   Procedure: REMOVAL OF VAGINAL MESH;  Surgeon: Bjorn Loser, MD;  Location: [facility];  Service: Urology;  Laterality: N/A;  . LAPAROSCOPY Bilateral 05/31/2013   Procedure: LAPAROSCOPY withbilateral salpingo-oophorectomy, lysis of adhesions;  Surgeon: Allena Katz, MD;  Location: [facility];  Service: Gynecology;  Laterality: Bilateral;  with clippings of Vaginal  mesh  . SALPINGOOPHORECTOMY Bilateral 05/31/2013   Procedure: BILATERAL SALPINGO OOPHORECTOMY/TRIM MESH;  Surgeon: Allena Katz, MD;  Location: [facility];  Service: Gynecology;  Laterality: Bilateral;  . TUBAL LIGATION     1995  . VAGINAL HYSTERECTOMY     1998 or 1997  . WRIST FRACTURE SURGERY  2005   Social History   Occupational History  . Not on file  Tobacco Use  . Smoking status: Never Smoker  . Smokeless tobacco: Never Used  Vaping Use  .  Vaping Use: Never used  Substance and Sexual Activity  . Alcohol use: Yes    Alcohol/week: 0.0 standard drinks    Comment: occasional; just on special occasion  . Drug use: No  . Sexual activity: Yes    Partners: Male    Birth control/protection: Surgical

## 2020-01-14 NOTE — Patient Instructions (Signed)
    Vitamin D3:  Take 8,000 - 10,000 IU daily  Vitamin K2:  Take 100 mcg daily

## 2020-01-16 ENCOUNTER — Encounter: Payer: Self-pay | Admitting: Neurology

## 2020-01-16 ENCOUNTER — Telehealth: Payer: Self-pay | Admitting: Family Medicine

## 2020-01-16 DIAGNOSIS — R29898 Other symptoms and signs involving the musculoskeletal system: Secondary | ICD-10-CM

## 2020-01-16 NOTE — Telephone Encounter (Signed)
Patient reports right leg weakness which is contributing to falls.  Has previously seen Dr. Tomi Likens for cervical disc issues.  Requests repeat referral as last seen in 2016.  If prolonged time to get in would be reasonable to have sit down with me to discuss possible MRI of lumbar spine but she does not report back pain issues recently

## 2020-01-24 ENCOUNTER — Encounter: Payer: Self-pay | Admitting: Family Medicine

## 2020-01-24 ENCOUNTER — Ambulatory Visit (INDEPENDENT_AMBULATORY_CARE_PROVIDER_SITE_OTHER): Payer: 59

## 2020-01-24 ENCOUNTER — Other Ambulatory Visit: Payer: Self-pay

## 2020-01-24 ENCOUNTER — Ambulatory Visit (INDEPENDENT_AMBULATORY_CARE_PROVIDER_SITE_OTHER): Payer: 59 | Admitting: Family Medicine

## 2020-01-24 DIAGNOSIS — S42255D Nondisplaced fracture of greater tuberosity of left humerus, subsequent encounter for fracture with routine healing: Secondary | ICD-10-CM | POA: Diagnosis not present

## 2020-01-24 NOTE — Progress Notes (Signed)
Office Visit Note   Patient: Nancy [name]           Date of Birth: 1965/10/23           MRN: [id number] Visit Date: 01/24/2020 Requested by: Marin Olp, MD [place],  [place] [address] PCP: Marin Olp, MD  Subjective: Chief Complaint  Patient presents with  . Left Shoulder - Fracture, Follow-up    In sling. Motrin 600 mg tid eases the pain some, along with Tramadol before bed prn. Still quite bruised.    HPI: She is here for follow-up 2 weeks status post fall resulting in left shoulder greater tuberosity humerus fracture.  Still having pain but getting slightly better.  Pain is mostly in the anterior shoulder, the biceps region.  She is wearing her sling and using ibuprofen.                ROS:   All other systems were reviewed and are negative.  Objective: Vital Signs: There were no vitals taken for this visit.  Physical Exam:  General:  Alert and oriented, in no acute distress. Pulm:  Breathing unlabored. Psy:  Normal mood, congruent affect. Skin: There is residual bruising in the anterior shoulder around the biceps region. Left shoulder: Still has limited active range of motion.  She is tender to palpation in the biceps but I do not think she has a Popeye deformity.   Imaging: XR Shoulder Left  Result Date: 01/24/2020 X-rays left shoulder reveal nondisplaced greater tuberosity fracture.  No other fracture seen.  Glenohumeral alignment is anatomic.   Assessment & Plan: 1.  Clinically stable 2-week status post left shoulder greater tuberosity humerus fracture.  Cannot rule out rotator cuff tear. -Start physical therapy for gentle range of motion to tolerance. -Return in about 2 weeks for repeat x-ray.  If clinically healing but still having substantial pain, we will consider MRI at that point.     Procedures: No procedures performed  No notes on file     PMFS History: Patient Active Problem List   Diagnosis Date Noted  .  Immunosuppressed status ([place]) 12/26/2019  . Hyperlipidemia 12/25/2019  . Rheumatoid arthritis involving multiple sites with positive rheumatoid factor ([place]) 02/21/2019  . Vitamin B12 deficiency 02/14/2019  . Urge incontinence 12/20/2018  . Urinary frequency 12/20/2018  . Fibromyalgia 04/12/2018  . Vitamin D deficiency 04/12/2018  . Vaginal dryness 04/12/2018  . History of total hysterectomy 04/12/2018  . Insomnia 04/12/2018  . Morbid obesity ([place]) 04/12/2018  . History of colon polyps 04/12/2018  . Primary osteoarthritis of both knees 12/28/2017  . Cervicogenic headache 03/26/2014  . Cervical radiculopathy 03/26/2014  . Mild intermittent asthma 11/20/2013  . Bradycardia 07/04/2012   Past Medical History:  Diagnosis Date  . Anemia   . Asthma   . Bradycardia   . Chicken pox   . Dysrhythmia    occ. palpitations  . Fibromyalgia   . Headache(784.0)   . Migraines   . Neck pain    bulging disk  . Polyarthralgia 02/21/2019  . PONV (postoperative nausea and vomiting)    sometimes wakes up after surgery and cannot breath due to asthma, can be bradycardic  . Rheumatoid arthritis involving multiple sites with positive rheumatoid factor ([place]) 02/21/2019  . Shortness of breath    with bradycardia    Family History  Problem Relation Age of Onset  . Hyperlipidemia Mother        78 in 2019  .  Arthritis Mother   . Osteoporosis Mother   . Lumbar disc disease Mother        herniated discs.   Marland Kitchen COPD Mother   . Hypertension Father        doesnt know his history well- no recent contact  . Heart disease Maternal Grandmother   . Diabetes Maternal Grandmother   . Cervical cancer Maternal Grandmother   . Heart disease Maternal Grandfather   . Cancer - Colon Maternal Grandfather   . Diabetes Paternal Grandmother   . Lung cancer Paternal Grandmother        dad's parents stepped in to help when dad not present  . Other Sister        doesnt know history well  . Lumbar disc disease Brother         grew up with him   . Atrial fibrillation Paternal Grandfather        required a few shocks  . Bradycardia Paternal Grandfather   . Hypertension Paternal Grandfather   . Other Brother        doesnt know history well    Past Surgical History:  Procedure Laterality Date  . ABDOMINAL HYSTERECTOMY    . BACK SURGERY     cervical fusion C5,6,7 with a bulge at C4  . BLADDER SURGERY  July 10th 2013  . [place]  . CYSTOSCOPY N/A 07/31/2018   Procedure: CYSTOSCOPY;  Surgeon: Bjorn Loser, MD;  Location: [facility];  Service: Urology;  Laterality: N/A;  . DIAGNOSTIC LAPAROSCOPY    . EXCISION OF MESH N/A 07/31/2018   Procedure: REMOVAL OF VAGINAL MESH;  Surgeon: Bjorn Loser, MD;  Location: [facility];  Service: Urology;  Laterality: N/A;  . LAPAROSCOPY Bilateral 05/31/2013   Procedure: LAPAROSCOPY withbilateral salpingo-oophorectomy, lysis of adhesions;  Surgeon: Allena Katz, MD;  Location: [facility];  Service: Gynecology;  Laterality: Bilateral;  with clippings of Vaginal  mesh  . SALPINGOOPHORECTOMY Bilateral 05/31/2013   Procedure: BILATERAL SALPINGO OOPHORECTOMY/TRIM MESH;  Surgeon: Allena Katz, MD;  Location: [facility];  Service: Gynecology;  Laterality: Bilateral;  . TUBAL LIGATION     1995  . VAGINAL HYSTERECTOMY     1998 or 1997  . WRIST FRACTURE SURGERY  2005   Social History   Occupational History  . Not on file  Tobacco Use  . Smoking status: Never Smoker  . Smokeless tobacco: Never Used  Vaping Use  . Vaping Use: Never used  Substance and Sexual Activity  . Alcohol use: Yes    Alcohol/week: 0.0 standard drinks    Comment: occasional; just on special occasion  . Drug use: No  . Sexual activity: Yes    Partners: Male    Birth control/protection: Surgical

## 2020-01-30 ENCOUNTER — Other Ambulatory Visit: Payer: Self-pay

## 2020-01-30 ENCOUNTER — Ambulatory Visit (INDEPENDENT_AMBULATORY_CARE_PROVIDER_SITE_OTHER): Payer: 59 | Admitting: Physical Therapy

## 2020-01-30 DIAGNOSIS — M6281 Muscle weakness (generalized): Secondary | ICD-10-CM | POA: Diagnosis not present

## 2020-01-30 DIAGNOSIS — M25512 Pain in left shoulder: Secondary | ICD-10-CM | POA: Diagnosis not present

## 2020-01-30 NOTE — Patient Instructions (Signed)
Access Code: [facility] URL: [email or web address] Date: 01/30/2020 Prepared by: Lyndee Hensen  Exercises Supine Shoulder Flexion AAROM - 2 x daily - 1 sets - 10 reps Seated Elbow Flexion and Extension AROM - 3 x daily - 1 sets - 10 reps Wrist Extension AROM - 2 x daily - 2 sets - 10 reps Seated Gripping Towel - 2 x daily - 2 sets - 10 reps

## 2020-02-01 ENCOUNTER — Encounter: Payer: Self-pay | Admitting: Physical Therapy

## 2020-02-01 NOTE — Therapy (Signed)
[place] [address] [place], [place], [address] Phone: [phone]   Fax:  [phone]  Physical Therapy Evaluation  Patient Details  Name: Nancy [name] MRN: [id number] Date of Birth: 03-05-1966 Referring Provider (PT): Eunice Blase   Encounter Date: 01/30/2020   PT End of Session - 02/01/20 1317    Visit Number 1    Number of Visits 12    Date for PT Re-Evaluation 03/12/20    Authorization Type [facility] [employer]    PT Start Time 1215    PT Stop Time 1255    PT Time Calculation (min) 40 min    Activity Tolerance Patient tolerated treatment well    Behavior During Therapy [facility] for tasks assessed/performed           Past Medical History:  Diagnosis Date  . Anemia   . Asthma   . Bradycardia   . Chicken pox   . Dysrhythmia    occ. palpitations  . Fibromyalgia   . Headache(784.0)   . Migraines   . Neck pain    bulging disk  . Polyarthralgia 02/21/2019  . PONV (postoperative nausea and vomiting)    sometimes wakes up after surgery and cannot breath due to asthma, can be bradycardic  . Rheumatoid arthritis involving multiple sites with positive rheumatoid factor ([place]) 02/21/2019  . Shortness of breath    with bradycardia    Past Surgical History:  Procedure Laterality Date  . ABDOMINAL HYSTERECTOMY    . BACK SURGERY     cervical fusion C5,6,7 with a bulge at C4  . BLADDER SURGERY  July 10th 2013  . [place]  . CYSTOSCOPY N/A 07/31/2018   Procedure: CYSTOSCOPY;  Surgeon: Bjorn Loser, MD;  [facility]: [facility];  Service: Urology;  Laterality: N/A;  . DIAGNOSTIC LAPAROSCOPY    . EXCISION OF MESH N/A 07/31/2018   Procedure: REMOVAL OF VAGINAL MESH;  Surgeon: Bjorn Loser, MD;  [facility];  Service: Urology;  Laterality: N/A;  . LAPAROSCOPY Bilateral 05/31/2013   Procedure: LAPAROSCOPY withbilateral salpingo-oophorectomy, lysis of adhesions;  Surgeon: Allena Katz, MD;   Location: [facility];  Service: Gynecology;  Laterality: Bilateral;  with clippings of Vaginal  mesh  . SALPINGOOPHORECTOMY Bilateral 05/31/2013   Procedure: BILATERAL SALPINGO OOPHORECTOMY/TRIM MESH;  Surgeon: Allena Katz, MD;  Location: [facility];  Service: Gynecology;  Laterality: Bilateral;  . TUBAL LIGATION     1995  . VAGINAL HYSTERECTOMY     1998 or 1997  . WRIST FRACTURE SURGERY  2005    There were no vitals filed for this visit.    Subjective Assessment - 02/01/20 1313    Subjective Recent knee injections bil. Pt had fall June 9. Bowling while visiting daughter. Also Fell day before that at hotel, stepping up curb. Did have R Hip pain, down front of leg in last couple weeks, seems to be improving now. Pt states new onset of feeling that balance is worsening. Pt suffered  nondisplaced greater tuberosity fracture from recent fall. Had recent imaging, now per MD can perform ROM to tolerance. Has been wearing sling    Patient Stated Goals decreased pain    Currently in Pain? Yes    Pain Score 5     Pain Location Shoulder    Pain Orientation Left    Pain Descriptors / Indicators Aching    Pain Type Acute pain    Pain Onset 1 to  4 weeks ago    Pain Frequency Intermittent    Aggravating Factors  wearing sling,  increased movement              [facility] PT Assessment - 02/01/20 0001      Assessment   Medical Diagnosis L shoulder pain/ Humerus Fx    Referring Provider (PT) Legrand Como [place]ts    Onset Date/Surgical Date 01/02/20    Hand Dominance Right    Prior Therapy not for shoulder      Balance Screen   Has the patient fallen in the past 6 months Yes    How many times? 2    Has the patient had a decrease in activity level because of a fear of falling?  No    Is the patient reluctant to leave their home because of a fear of falling?  No      Prior Function   Level of Independence Independent      Cognition   Overall Cognitive Status Within Functional Limits for tasks  assessed      AROM   Overall AROM Comments L shoulder: not tested:  R Elbow, wrist: WFL;       PROM   Left Shoulder Flexion 130 Degrees    Left Shoulder Internal Rotation 60 Degrees    Left Shoulder External Rotation 60 Degrees      Strength   Overall Strength Comments L UE: not tested      Palpation   Palpation comment Brusing at L bicep region, mild swelling in upper arm compared to R side.                       Objective measurements completed on examination: See above findings.       [facility] Adult PT Treatment/Exercise - 02/01/20 0001      Exercises   Exercises Shoulder      Shoulder Exercises: Supine   Flexion PROM    Flexion Limitations Self PROM, x10 for HEP       Shoulder Exercises: Standing   Other Standing Exercises Elbow AROM flex/ext x 20; Wrist ext x 10 and gripping x 10 for HEP       Manual Therapy   Manual Therapy Joint mobilization;Passive ROM;Soft tissue mobilization;Taping    Manual therapy comments --    Soft tissue mobilization STM to L upper arm and bicep    Passive ROM L shoulder, all motions to tolerance     Kinesiotex Edema      Kinesiotix   Edema 2 Fan strips for swelling to L upper arm over bruise                   PT Education - 02/01/20 1315    Education Details INitial HEP, PT POC, Exam findings, Pt education on activity  restrictions with UE at this time.    Person(s) Educated Patient    Methods Explanation;Demonstration;Tactile cues;Handout    Comprehension Verbalized understanding;Returned demonstration;Need further instruction;Verbal cues required            PT Short Term Goals - 02/01/20 1318      PT SHORT TERM GOAL #1   Title Pt to be independent with initial HEP    Time 2    Period Weeks    Status New    Target Date 02/13/20             PT Long Term Goals - 02/01/20 1318      PT  LONG TERM GOAL #1   Title Pt to be independent with final HEP    Time 6    Period Weeks    Status New     Target Date 03/12/20      PT LONG TERM GOAL #2   Title Pt to report pain in L shoulder to 0-2/10 with activity    Time 6    Period Weeks    Status New    Target Date 03/12/20      PT LONG TERM GOAL #3   Title Pt to demo improved AROM for L UE to be WNL to improve ability for IADLS and work duties.    Time 6    Period Weeks    Status New    Target Date 03/12/20      PT LONG TERM GOAL #4   Title Pt to demo improved strength of L shoulder to at least 4+/5 for all motions, to improve ability for reaching, lifting, carrying and IADLS.    Time 6    Period Weeks    Status New    Target Date 03/12/20                  Plan - 02/01/20 1324    Clinical Impression Statement Pt presents with primary complaint of increased pain in L shoulder, following fall and humeral fracture on 01/02/20. Pt with decreased PROM and hypomobility, as well as decreased ability for AROM at this time. Pt with weakness in L UE and decreased ability for full functional activitiy. Pt with decreased ability for reaching, lifting, carrying, IADLS and work duties due to deficits and pain. Pt also with decreased balance. Formal balance testing to be done next visit, pt to benefit from education and practice on balance for improved safety and fall risk. Pt to benefit from skilled PT to improve deficits and return to full activiteis with L UE.    Personal Factors and Comorbidities Fitness;Comorbidity 1    Comorbidities RA, fibromyalgia,    Examination-Activity Limitations Lift;Bathing;Reach Overhead;Sleep;Carry;Dressing;Hygiene/Grooming    Examination-Participation Restrictions Cleaning;Community Activity;Shop;Meal Prep;Driving;Laundry;Yard Work    Stability/Clinical Decision Making Stable/Uncomplicated    Clinical Decision Making Low    Rehab Potential Good    PT Frequency 2x / week    PT Duration 6 weeks    PT Treatment/Interventions ADLs/Self Care Home Management;Cryotherapy;Electrical Stimulation;DME  Instruction;Ultrasound;Moist Heat;Iontophoresis 4mg /ml Dexamethasone;Functional mobility training;Therapeutic activities;Therapeutic exercise;Neuromuscular re-education;Manual techniques;Patient/family education;Passive range of motion;Dry needling;Energy conservation;Joint Manipulations;Spinal Manipulations;Taping;Balance training    PT Home Exercise Plan [email or web address]    Consulted and Agree with Plan of Care Patient           Patient will benefit from skilled therapeutic intervention in order to improve the following deficits and impairments:  Decreased range of motion, Increased muscle spasms, Decreased activity tolerance, Pain, Impaired flexibility, Decreased strength, Decreased mobility, Impaired UE functional use, Decreased knowledge of precautions, Hypomobility  Visit Diagnosis: Acute pain of left shoulder  Muscle weakness (generalized)     Problem List Patient Active Problem List   Diagnosis Date Noted  . Immunosuppressed status ([place]) 12/26/2019  . Hyperlipidemia 12/25/2019  . Rheumatoid arthritis involving multiple sites with positive rheumatoid factor ([place]) 02/21/2019  . Vitamin B12 deficiency 02/14/2019  . Urge incontinence 12/20/2018  . Urinary frequency 12/20/2018  . Fibromyalgia 04/12/2018  . Vitamin D deficiency 04/12/2018  . Vaginal dryness 04/12/2018  . History of total hysterectomy 04/12/2018  . Insomnia 04/12/2018  . Morbid obesity ([place]) 04/12/2018  . History of colon polyps  04/12/2018  . Primary osteoarthritis of both knees 12/28/2017  . Cervicogenic headache 03/26/2014  . Cervical radiculopathy 03/26/2014  . Mild intermittent asthma 11/20/2013  . Bradycardia 07/04/2012    Lyndee Hensen, PT, DPT 1:41 PM  02/01/20    [place], [place], [address] Phone: [phone]   Fax:  [phone]  Name: Eleesha Purkey MRN: [id number] Date of Birth: January 28, 1966

## 2020-02-06 ENCOUNTER — Other Ambulatory Visit: Payer: Self-pay

## 2020-02-06 ENCOUNTER — Ambulatory Visit: Payer: 59 | Admitting: Physical Therapy

## 2020-02-06 ENCOUNTER — Encounter: Payer: Self-pay | Admitting: Physical Therapy

## 2020-02-06 DIAGNOSIS — M6281 Muscle weakness (generalized): Secondary | ICD-10-CM | POA: Diagnosis not present

## 2020-02-06 DIAGNOSIS — M25512 Pain in left shoulder: Secondary | ICD-10-CM

## 2020-02-06 NOTE — Therapy (Signed)
[facility] [address] [place], [place], [address] Phone: [phone]   Fax:  [phone]  Physical Therapy Treatment  Patient Details  Name: Nancy [name] MRN: [id number] Date of Birth: 04/09/1966 Referring Provider (PT): Eunice Blase   Encounter Date: 02/06/2020   PT End of Session - 02/06/20 0929    Visit Number 2    Number of Visits 12    Date for PT Re-Evaluation 03/12/20    Authorization Type [employer]    PT Start Time 0850    PT Stop Time 0925    PT Time Calculation (min) 35 min    Activity Tolerance Patient tolerated treatment well    Behavior During Therapy [facility] for tasks assessed/performed           Past Medical History:  Diagnosis Date  . Anemia   . Asthma   . Bradycardia   . Chicken pox   . Dysrhythmia    occ. palpitations  . Fibromyalgia   . Headache(784.0)   . Migraines   . Neck pain    bulging disk  . Polyarthralgia 02/21/2019  . PONV (postoperative nausea and vomiting)    sometimes wakes up after surgery and cannot breath due to asthma, can be bradycardic  . Rheumatoid arthritis involving multiple sites with positive rheumatoid factor ([place]) 02/21/2019  . Shortness of breath    with bradycardia    Past Surgical History:  Procedure Laterality Date  . ABDOMINAL HYSTERECTOMY    . BACK SURGERY     cervical fusion C5,6,7 with a bulge at C4  . BLADDER SURGERY  July 10th 2013  . [place]  . CYSTOSCOPY N/A 07/31/2018   Procedure: CYSTOSCOPY;  Surgeon: Bjorn Loser, MD;  Location: [facility];  Service: Urology;  Laterality: N/A;  . DIAGNOSTIC LAPAROSCOPY    . EXCISION OF MESH N/A 07/31/2018   Procedure: REMOVAL OF VAGINAL MESH;  Surgeon: Bjorn Loser, MD;  Location: [facility];  Service: Urology;  Laterality: N/A;  . LAPAROSCOPY Bilateral 05/31/2013   Procedure: LAPAROSCOPY withbilateral salpingo-oophorectomy, lysis of adhesions;  Surgeon: Allena Katz, MD;   Location: [facility];  Service: Gynecology;  Laterality: Bilateral;  with clippings of Vaginal  mesh  . SALPINGOOPHORECTOMY Bilateral 05/31/2013   Procedure: BILATERAL SALPINGO OOPHORECTOMY/TRIM MESH;  Surgeon: Allena Katz, MD;  Location: [facility];  Service: Gynecology;  Laterality: Bilateral;  . TUBAL LIGATION     1995  . VAGINAL HYSTERECTOMY     1998 or 1997  . WRIST FRACTURE SURGERY  2005    There were no vitals filed for this visit.   Subjective Assessment - 02/06/20 0928    Subjective Pt has been doing HEP. States little pain in shoulder, does have pain at mid humerus at area of bruising.    Patient Stated Goals decreased pain    Currently in Pain? Yes    Pain Location Shoulder    Pain Orientation Left    Pain Descriptors / Indicators Aching    Pain Type Acute pain    Pain Onset More than a month ago    Pain Frequency Intermittent              [facility] PT Assessment - 02/06/20 0001      PROM   Left Shoulder Flexion 155 Degrees    Left Shoulder Internal Rotation 65 Degrees    Left Shoulder External Rotation 70 Degrees  [facility] Adult PT Treatment/Exercise - 02/06/20 0856      Exercises   Exercises Shoulder      Shoulder Exercises: Supine   Flexion PROM    Flexion Limitations Self PROM, x10 for HEP     Other Supine Exercises PROM/AAROM flexion, cane x 15 ;       Shoulder Exercises: Standing   Retraction 15 reps    Other Standing Exercises Elbow AROM flex/ext x 20;       Shoulder Exercises: Pulleys   Flexion 2 minutes    Flexion Limitations PROM/L      Manual Therapy   Manual Therapy Joint mobilization;Passive ROM;Soft tissue mobilization;Taping    Soft tissue mobilization STM to L upper arm and bicep for pain and swelling     Passive ROM L shoulder, all motions to tolerance     Kinesiotex Edema      Kinesiotix   Edema 2 Fan strips for swelling to L upper arm over bruise                   PT Education -  02/06/20 0929    Education Details HEP reviewed    Person(s) Educated Patient    Methods Explanation;Demonstration;Verbal cues    Comprehension Verbalized understanding;Returned demonstration;Verbal cues required            PT Short Term Goals - 02/01/20 1318      PT SHORT TERM GOAL #1   Title Pt to be independent with initial HEP    Time 2    Period Weeks    Status New    Target Date 02/13/20             PT Long Term Goals - 02/01/20 1318      PT LONG TERM GOAL #1   Title Pt to be independent with final HEP    Time 6    Period Weeks    Status New    Target Date 03/12/20      PT LONG TERM GOAL #2   Title Pt to report pain in L shoulder to 0-2/10 with activity    Time 6    Period Weeks    Status New    Target Date 03/12/20      PT LONG TERM GOAL #3   Title Pt to demo improved AROM for L UE to be WNL to improve ability for IADLS and work duties.    Time 6    Period Weeks    Status New    Target Date 03/12/20      PT LONG TERM GOAL #4   Title Pt to demo improved strength of L shoulder to at least 4+/5 for all motions, to improve ability for reaching, lifting, carrying and IADLS.    Time 6    Period Weeks    Status New    Target Date 03/12/20                 Plan - 02/06/20 0930    Clinical Impression Statement Pt with improved swelling and decreased bruising today. Improved PROM for all motions, with little pain. Pt making good progress, will progress to [facility] and AROM as able, per MD, pt with f/u tomorrow.    Personal Factors and Comorbidities Fitness;Comorbidity 1    Comorbidities RA, fibromyalgia,    Examination-Activity Limitations Lift;Bathing;Reach Overhead;Sleep;Carry;Dressing;Hygiene/Grooming    Examination-Participation Restrictions Cleaning;Community Activity;Shop;Meal Prep;Driving;Laundry;Yard Work    Stability/Clinical Decision Making Stable/Uncomplicated    Rehab Potential Good  PT Frequency 2x / week    PT Duration 6 weeks    PT  Treatment/Interventions ADLs/Self Care Home Management;Cryotherapy;Electrical Stimulation;DME Instruction;Ultrasound;Moist Heat;Iontophoresis 4mg /ml Dexamethasone;Functional mobility training;Therapeutic activities;Therapeutic exercise;Neuromuscular re-education;Manual techniques;Patient/family education;Passive range of motion;Dry needling;Energy conservation;Joint Manipulations;Spinal Manipulations;Taping;Balance training    PT Home Exercise Plan [email or web address]    Consulted and Agree with Plan of Care Patient           Patient will benefit from skilled therapeutic intervention in order to improve the following deficits and impairments:  Decreased range of motion, Increased muscle spasms, Decreased activity tolerance, Pain, Impaired flexibility, Decreased strength, Decreased mobility, Impaired UE functional use, Decreased knowledge of precautions, Hypomobility  Visit Diagnosis: Acute pain of left shoulder  Muscle weakness (generalized)     Problem List Patient Active Problem List   Diagnosis Date Noted  . Immunosuppressed status ([place]) 12/26/2019  . Hyperlipidemia 12/25/2019  . Rheumatoid arthritis involving multiple sites with positive rheumatoid factor ([place]) 02/21/2019  . Vitamin B12 deficiency 02/14/2019  . Urge incontinence 12/20/2018  . Urinary frequency 12/20/2018  . Fibromyalgia 04/12/2018  . Vitamin D deficiency 04/12/2018  . Vaginal dryness 04/12/2018  . History of total hysterectomy 04/12/2018  . Insomnia 04/12/2018  . Morbid obesity ([place]) 04/12/2018  . History of colon polyps 04/12/2018  . Primary osteoarthritis of both knees 12/28/2017  . Cervicogenic headache 03/26/2014  . Cervical radiculopathy 03/26/2014  . Mild intermittent asthma 11/20/2013  . Bradycardia 07/04/2012    Lyndee Hensen, PT, DPT 9:32 AM  02/06/20    [facility] [place], [place], [address] Phone: [phone]   Fax:   [phone]  Name: Palestine Mosco MRN: [id number] Date of Birth: 10-12-1965

## 2020-02-07 ENCOUNTER — Encounter: Payer: Self-pay | Admitting: Family Medicine

## 2020-02-07 ENCOUNTER — Ambulatory Visit (INDEPENDENT_AMBULATORY_CARE_PROVIDER_SITE_OTHER): Payer: 59

## 2020-02-07 ENCOUNTER — Ambulatory Visit: Payer: Self-pay

## 2020-02-07 ENCOUNTER — Ambulatory Visit (INDEPENDENT_AMBULATORY_CARE_PROVIDER_SITE_OTHER): Payer: 59 | Admitting: Family Medicine

## 2020-02-07 DIAGNOSIS — S42255D Nondisplaced fracture of greater tuberosity of left humerus, subsequent encounter for fracture with routine healing: Secondary | ICD-10-CM

## 2020-02-07 NOTE — Progress Notes (Signed)
Office Visit Note   Patient: Nancy [name]           Date of Birth: [birth year]           MRN: [id number] Visit Date: 02/07/2020 Requested by: Marin Olp, MD [place],  [place] [address] PCP: Marin Olp, MD  Subjective: Chief Complaint  Patient presents with  . Left Shoulder - Pain    HPI: She is about a month status post fall resulting in left greater tuberosity humerus fracture.  Pain is steadily improving.  She is able to go without her sling much of the time.  She is doing physical therapy and her passive range of motion is great.  She still cannot actively abduct her shoulder.               ROS:   All other systems were reviewed and are negative.  Objective: Vital Signs: There were no vitals taken for this visit.  Physical Exam:  General:  Alert and oriented, in no acute distress. Pulm:  Breathing unlabored. Psy:  Normal mood, congruent affect. Skin: There is resolving ecchymosis in the anterior biceps region. Left shoulder: She has full passive range of motion.  She is tender to palpation over the anterolateral aspect of the shoulder.  Good strength with internal and external rotation, but weakness with empty can test.  Imaging: US Guided Needle Placement  Result Date: 02/07/2020 Diagnostic ultrasound left shoulder: Long head bicep tendon is located in its groove and is intact.  There is some fluid in the tendon sheath.  Subscapularis tendon looks normal.  Supraspinatus has a deep intrasubstance partial tear involving a small percentage of the tendon.  Infraspinatus looks intact.  No obvious posterior labral tear.  XR Shoulder Left  Result Date: 02/07/2020 X-rays left shoulder reveal anatomic alignment of the fracture with good callus formation present.   Assessment & Plan: 1.  Clinically healing 1 month status post left shoulder greater tuberosity humerus fracture.  Probable partial tear of the supraspinatus. -Continue with physical  therapy.  Wean from sling as pain permits.  Return in about 3 to 4 weeks for recheck.  If still in a lot of pain we will take 1 more x-ray.  If x-ray looks good, then MRI scan of the shoulder versus one-time subacromial injection.     Procedures: No procedures performed  No notes on file     PMFS History: Patient Active Problem List   Diagnosis Date Noted  . Immunosuppressed status ([place]) 12/26/2019  . Hyperlipidemia 12/25/2019  . Rheumatoid arthritis involving multiple sites with positive rheumatoid factor ([place]) 02/21/2019  . Vitamin B12 deficiency 02/14/2019  . Urge incontinence 12/20/2018  . Urinary frequency 12/20/2018  . Fibromyalgia 04/12/2018  . Vitamin D deficiency 04/12/2018  . Vaginal dryness 04/12/2018  . History of total hysterectomy 04/12/2018  . Insomnia 04/12/2018  . Morbid obesity ([place]) 04/12/2018  . History of colon polyps 04/12/2018  . Primary osteoarthritis of both knees 12/28/2017  . Cervicogenic headache 03/26/2014  . Cervical radiculopathy 03/26/2014  . Mild intermittent asthma 11/20/2013  . Bradycardia 07/04/2012   Past Medical History:  Diagnosis Date  . Anemia   . Asthma   . Bradycardia   . Chicken pox   . Dysrhythmia    occ. palpitations  . Fibromyalgia   . Headache(784.0)   . Migraines   . Neck pain    bulging disk  . Polyarthralgia 02/21/2019  . PONV (postoperative nausea and vomiting)  sometimes wakes up after surgery and cannot breath due to asthma, can be bradycardic  . Rheumatoid arthritis involving multiple sites with positive rheumatoid factor ([place]) 02/21/2019  . Shortness of breath    with bradycardia    Family History  Problem Relation Age of Onset  . Hyperlipidemia Mother        64 in 2019  . Arthritis Mother   . Osteoporosis Mother   . Lumbar disc disease Mother        herniated discs.   Marland Kitchen COPD Mother   . Hypertension Father        doesnt know his history well- no recent contact  . Heart disease Maternal Grandmother    . Diabetes Maternal Grandmother   . Cervical cancer Maternal Grandmother   . Heart disease Maternal Grandfather   . Cancer - Colon Maternal Grandfather   . Diabetes Paternal Grandmother   . Lung cancer Paternal Grandmother        dad's parents stepped in to help when dad not present  . Other Sister        doesnt know history well  . Lumbar disc disease Brother        grew up with him   . Atrial fibrillation Paternal Grandfather        required a few shocks  . Bradycardia Paternal Grandfather   . Hypertension Paternal Grandfather   . Other Brother        doesnt know history well    Past Surgical History:  Procedure Laterality Date  . ABDOMINAL HYSTERECTOMY    . BACK SURGERY     cervical fusion C5,6,7 with a bulge at C4  . BLADDER SURGERY  July 10th 2013  . [place]  . CYSTOSCOPY N/A 07/31/2018   Procedure: CYSTOSCOPY;  Surgeon: Bjorn Loser, MD;  [facility]: [facility];  Service: Urology;  Laterality: N/A;  . DIAGNOSTIC LAPAROSCOPY    . EXCISION OF MESH N/A 07/31/2018   Procedure: REMOVAL OF VAGINAL MESH;  Surgeon: Bjorn Loser, MD;  Location: [facility];  Service: Urology;  Laterality: N/A;  . LAPAROSCOPY Bilateral 05/31/2013   Procedure: LAPAROSCOPY withbilateral salpingo-oophorectomy, lysis of adhesions;  Surgeon: Allena Katz, MD;  Location: [facility];  Service: Gynecology;  Laterality: Bilateral;  with clippings of Vaginal  mesh  . SALPINGOOPHORECTOMY Bilateral 05/31/2013   Procedure: BILATERAL SALPINGO OOPHORECTOMY/TRIM MESH;  Surgeon: Allena Katz, MD;  Location: [facility];  Service: Gynecology;  Laterality: Bilateral;  . TUBAL LIGATION     1995  . VAGINAL HYSTERECTOMY     1998 or 1997  . WRIST FRACTURE SURGERY  2005   Social History   Occupational History  . Not on file  Tobacco Use  . Smoking status: Never Smoker  . Smokeless tobacco: Never Used  Vaping Use  . Vaping Use: Never used  Substance and Sexual Activity  . Alcohol  use: Yes    Alcohol/week: 0.0 standard drinks    Comment: occasional; just on special occasion  . Drug use: No  . Sexual activity: Yes    Partners: Male    Birth control/protection: Surgical

## 2020-02-10 ENCOUNTER — Ambulatory Visit: Payer: 59 | Admitting: Physical Therapy

## 2020-02-10 ENCOUNTER — Encounter: Payer: Self-pay | Admitting: Physical Therapy

## 2020-02-10 ENCOUNTER — Other Ambulatory Visit: Payer: Self-pay

## 2020-02-10 DIAGNOSIS — M25512 Pain in left shoulder: Secondary | ICD-10-CM

## 2020-02-10 DIAGNOSIS — M6281 Muscle weakness (generalized): Secondary | ICD-10-CM

## 2020-02-10 NOTE — Therapy (Signed)
[facility] [address] [place], [place], [address] Phone: [phone]   Fax:  [phone]  Physical Therapy Treatment  Patient Details  Name: Nancy [name] MRN: [id number] Date of Birth: 23-Mar-1966 Referring Provider (PT): Eunice Blase   Encounter Date: 02/10/2020   PT End of Session - 02/10/20 1304    Visit Number 3    Number of Visits 12    Date for PT Re-Evaluation 03/12/20    Authorization Type [employer]    PT Start Time 1200    PT Stop Time 1239    PT Time Calculation (min) 39 min    Activity Tolerance Patient tolerated treatment well    Behavior During Therapy [facility] for tasks assessed/performed           Past Medical History:  Diagnosis Date  . Anemia   . Asthma   . Bradycardia   . Chicken pox   . Dysrhythmia    occ. palpitations  . Fibromyalgia   . Headache(784.0)   . Migraines   . Neck pain    bulging disk  . Polyarthralgia 02/21/2019  . PONV (postoperative nausea and vomiting)    sometimes wakes up after surgery and cannot breath due to asthma, can be bradycardic  . Rheumatoid arthritis involving multiple sites with positive rheumatoid factor ([place]) 02/21/2019  . Shortness of breath    with bradycardia    Past Surgical History:  Procedure Laterality Date  . ABDOMINAL HYSTERECTOMY    . BACK SURGERY     cervical fusion C5,6,7 with a bulge at C4  . BLADDER SURGERY  July 10th 2013  . [place]  . CYSTOSCOPY N/A 07/31/2018   Procedure: CYSTOSCOPY;  Surgeon: Bjorn Loser, MD;  [facility];  Service: Urology;  Laterality: N/A;  . DIAGNOSTIC LAPAROSCOPY    . EXCISION OF MESH N/A 07/31/2018   Procedure: REMOVAL OF VAGINAL MESH;  Surgeon: Bjorn Loser, MD;  [facility]: [facility];  Service: Urology;  Laterality: N/A;  . LAPAROSCOPY Bilateral 05/31/2013   Procedure: LAPAROSCOPY withbilateral salpingo-oophorectomy, lysis of adhesions;  Surgeon: Allena Katz, MD;   Location: [facility];  Service: Gynecology;  Laterality: Bilateral;  with clippings of Vaginal  mesh  . SALPINGOOPHORECTOMY Bilateral 05/31/2013   Procedure: BILATERAL SALPINGO OOPHORECTOMY/TRIM MESH;  Surgeon: Allena Katz, MD;  Location: [facility];  Service: Gynecology;  Laterality: Bilateral;  . TUBAL LIGATION     1995  . VAGINAL HYSTERECTOMY     1998 or 1997  . WRIST FRACTURE SURGERY  2005    There were no vitals filed for this visit.   Subjective Assessment - 02/10/20 1303    Subjective Pt had f/u with MD. Shows good healing of fx, but small supraspinatus tear. Pt reports having pain and difficulty with AROm/elevation.    Currently in Pain? Yes    Pain Location Shoulder    Pain Orientation Left    Pain Descriptors / Indicators Aching    Pain Type Acute pain    Pain Onset More than a month ago    Pain Frequency Intermittent                             [facility] Adult PT Treatment/Exercise - 02/10/20 1206      Exercises   Exercises Shoulder      Shoulder Exercises: Supine   External Rotation AAROM;20 reps  External Rotation Limitations cane    Flexion AAROM;20 reps    Flexion Limitations cane     Other Supine Exercises IR/ER 1 lb x15;     Other Supine Exercises 90/90 small range flex/ext and abd/add x 15 each       Shoulder Exercises: Standing   Flexion AAROM;15 reps    Flexion Limitations With stick, Partial ROM (less than 90 deg )      Row 20 reps    Theraband Level (Shoulder Row) Level 2 (Red)    Retraction --    Other Standing Exercises --    Other Standing Exercises Wall slides 2 hands at door x 15;       Shoulder Exercises: Pulleys   Flexion 2 minutes    Flexion Limitations --    ABduction 1 minute      Shoulder Exercises: Isometric Strengthening   External Rotation 5X10"    Internal Rotation 5X10"      Manual Therapy   Manual Therapy Joint mobilization;Passive ROM;Soft tissue mobilization;Taping    Soft tissue mobilization --     Passive ROM L shoulder, all motions to tolerance     Kinesiotex --      Kinesiotix   Edema --                  PT Education - 02/10/20 1304    Education Details HEP updated    Person(s) Educated Patient    Methods Explanation;Demonstration;Verbal cues;Handout    Comprehension Verbal cues required;Verbalized understanding;Returned demonstration;Need further instruction;Tactile cues required            PT Short Term Goals - 02/01/20 1318      PT SHORT TERM GOAL #1   Title Pt to be independent with initial HEP    Time 2    Period Weeks    Status New    Target Date 02/13/20             PT Long Term Goals - 02/01/20 1318      PT LONG TERM GOAL #1   Title Pt to be independent with final HEP    Time 6    Period Weeks    Status New    Target Date 03/12/20      PT LONG TERM GOAL #2   Title Pt to report pain in L shoulder to 0-2/10 with activity    Time 6    Period Weeks    Status New    Target Date 03/12/20      PT LONG TERM GOAL #3   Title Pt to demo improved AROM for L UE to be WNL to improve ability for IADLS and work duties.    Time 6    Period Weeks    Status New    Target Date 03/12/20      PT LONG TERM GOAL #4   Title Pt to demo improved strength of L shoulder to at least 4+/5 for all motions, to improve ability for reaching, lifting, carrying and IADLS.    Time 6    Period Weeks    Status New    Target Date 03/12/20                 Plan - 02/10/20 1306    Clinical Impression Statement Pt progressing well with PROM and abiltiy for AAROM. Mild soreness today with increased ther ex . Does have weakness and difficulty for elevation in standing. Plan to progress as tolerated.  Personal Factors and Comorbidities Fitness;Comorbidity 1    Comorbidities RA, fibromyalgia,    Examination-Activity Limitations Lift;Bathing;Reach Overhead;Sleep;Carry;Dressing;Hygiene/Grooming    Examination-Participation Restrictions Cleaning;Community  Activity;Shop;Meal Prep;Driving;Laundry;Yard Work    Stability/Clinical Decision Making Stable/Uncomplicated    Rehab Potential Good    PT Frequency 2x / week    PT Duration 6 weeks    PT Treatment/Interventions ADLs/Self Care Home Management;Cryotherapy;Electrical Stimulation;DME Instruction;Ultrasound;Moist Heat;Iontophoresis 4mg /ml Dexamethasone;Functional mobility training;Therapeutic activities;Therapeutic exercise;Neuromuscular re-education;Manual techniques;Patient/family education;Passive range of motion;Dry needling;Energy conservation;Joint Manipulations;Spinal Manipulations;Taping;Balance training    PT Home Exercise Plan [email or web address]    Consulted and Agree with Plan of Care Patient           Patient will benefit from skilled therapeutic intervention in order to improve the following deficits and impairments:  Decreased range of motion, Increased muscle spasms, Decreased activity tolerance, Pain, Impaired flexibility, Decreased strength, Decreased mobility, Impaired UE functional use, Decreased knowledge of precautions, Hypomobility  Visit Diagnosis: Acute pain of left shoulder  Muscle weakness (generalized)     Problem List Patient Active Problem List   Diagnosis Date Noted  . Immunosuppressed status ([place]) 12/26/2019  . Hyperlipidemia 12/25/2019  . Rheumatoid arthritis involving multiple sites with positive rheumatoid factor ([place]) 02/21/2019  . Vitamin B12 deficiency 02/14/2019  . Urge incontinence 12/20/2018  . Urinary frequency 12/20/2018  . Fibromyalgia 04/12/2018  . Vitamin D deficiency 04/12/2018  . Vaginal dryness 04/12/2018  . History of total hysterectomy 04/12/2018  . Insomnia 04/12/2018  . Morbid obesity ([place]) 04/12/2018  . History of colon polyps 04/12/2018  . Primary osteoarthritis of both knees 12/28/2017  . Cervicogenic headache 03/26/2014  . Cervical radiculopathy 03/26/2014  . Mild intermittent asthma 11/20/2013  . Bradycardia 07/04/2012    Lyndee Hensen, PT, DPT 1:07 PM  02/10/20    [facility] [place], [place], [address] Phone: [phone]   Fax:  [phone]  Name: Nancy [name] MRN: [id number] Date of Birth: 1965/09/25

## 2020-02-12 MED FILL — RINVOQ 15 MG TB24: 15 | 30 days supply | Qty: 30 | Fill #1

## 2020-02-12 MED FILL — METHOTREXATE 2.5 MG TAB: 2.5 | 35 days supply | Qty: 40 | Fill #1

## 2020-02-13 ENCOUNTER — Other Ambulatory Visit: Payer: Self-pay

## 2020-02-13 ENCOUNTER — Encounter: Payer: Self-pay | Admitting: Physical Therapy

## 2020-02-13 ENCOUNTER — Ambulatory Visit: Payer: 59 | Admitting: Physical Therapy

## 2020-02-13 DIAGNOSIS — M25512 Pain in left shoulder: Secondary | ICD-10-CM | POA: Diagnosis not present

## 2020-02-13 DIAGNOSIS — M6281 Muscle weakness (generalized): Secondary | ICD-10-CM | POA: Diagnosis not present

## 2020-02-13 NOTE — Therapy (Signed)
[facility] [address] [place], [place], [address] Phone: [phone]   Fax:  [phone]  Physical Therapy Treatment  Patient Details  Name: Nancy [name] MRN: [id number] Date of Birth: 04-30-1966 Referring Provider (PT): Eunice Blase   Encounter Date: 02/13/2020   PT End of Session - 02/13/20 1355    Visit Number 4    Number of Visits 12    Date for PT Re-Evaluation 03/12/20    Authorization Type [employer]    PT Start Time 1215    PT Stop Time 1300    PT Time Calculation (min) 45 min    Activity Tolerance Patient tolerated treatment well    Behavior During Therapy [facility] for tasks assessed/performed           Past Medical History:  Diagnosis Date  . Anemia   . Asthma   . Bradycardia   . Chicken pox   . Dysrhythmia    occ. palpitations  . Fibromyalgia   . Headache(784.0)   . Migraines   . Neck pain    bulging disk  . Polyarthralgia 02/21/2019  . PONV (postoperative nausea and vomiting)    sometimes wakes up after surgery and cannot breath due to asthma, can be bradycardic  . Rheumatoid arthritis involving multiple sites with positive rheumatoid factor ([place]) 02/21/2019  . Shortness of breath    with bradycardia    Past Surgical History:  Procedure Laterality Date  . ABDOMINAL HYSTERECTOMY    . BACK SURGERY     cervical fusion C5,6,7 with a bulge at C4  . BLADDER SURGERY  July 10th 2013  . [place]  . CYSTOSCOPY N/A 07/31/2018   Procedure: CYSTOSCOPY;  Surgeon: Bjorn Loser, MD;  Location: [facility];  Service: Urology;  Laterality: N/A;  . DIAGNOSTIC LAPAROSCOPY    . EXCISION OF MESH N/A 07/31/2018   Procedure: REMOVAL OF VAGINAL MESH;  Surgeon: Bjorn Loser, MD;  Location: [facility];  Service: Urology;  Laterality: N/A;  . LAPAROSCOPY Bilateral 05/31/2013   Procedure: LAPAROSCOPY withbilateral salpingo-oophorectomy, lysis of adhesions;  Surgeon: Allena Katz, MD;   Location: [facility];  Service: Gynecology;  Laterality: Bilateral;  with clippings of Vaginal  mesh  . SALPINGOOPHORECTOMY Bilateral 05/31/2013   Procedure: BILATERAL SALPINGO OOPHORECTOMY/TRIM MESH;  Surgeon: Allena Katz, MD;  Location: [facility];  Service: Gynecology;  Laterality: Bilateral;  . TUBAL LIGATION     1995  . VAGINAL HYSTERECTOMY     1998 or 1997  . WRIST FRACTURE SURGERY  2005    There were no vitals filed for this visit.   Subjective Assessment - 02/13/20 1354    Subjective Pt states soreness after last visit. Having soreness and difficulty with attempts for elevatoin    Currently in Pain? Yes    Pain Location Shoulder    Pain Orientation Left    Pain Descriptors / Indicators Aching    Pain Onset More than a month ago    Pain Frequency Intermittent                             [facility] Adult PT Treatment/Exercise - 02/13/20 1237      Exercises   Exercises Shoulder      Shoulder Exercises: Supine   External Rotation --    External Rotation Limitations --    Flexion AAROM;10 reps    Flexion Limitations  cane x10;  x10 AROM    Other Supine Exercises IR/ER 1 lb x15;     Other Supine Exercises Chest press cane x20;       Shoulder Exercises: Standing   Flexion AAROM;10 reps    Flexion Limitations With stick,    Row 20 reps    Theraband Level (Shoulder Row) Level 2 (Red)    Other Standing Exercises Wall slides 2 hands at door x 15;       Shoulder Exercises: Pulleys   Flexion 2 minutes    ABduction 1 minute      Shoulder Exercises: Isometric Strengthening   External Rotation 5X10"    Internal Rotation --      Shoulder Exercises: Stretch   Internal Rotation Stretch 10 seconds    Internal Rotation Stretch Limitations IR stick behind back;       Modalities   Modalities Electrical Stimulation;Cryotherapy      Cryotherapy   Number Minutes Cryotherapy 10 Minutes    Cryotherapy Location Shoulder    Type of Cryotherapy Ice pack       Electrical Stimulation   Electrical Stimulation Location L Shoulder    Electrical Stimulation Action Pre-Mod    Electrical Stimulation Parameters x10 min    Electrical Stimulation Goals Pain      Manual Therapy   Manual Therapy Joint mobilization;Passive ROM;Soft tissue mobilization;Taping    Passive ROM L shoulder, all motions to tolerance                     PT Short Term Goals - 02/01/20 1318      PT SHORT TERM GOAL #1   Title Pt to be independent with initial HEP    Time 2    Period Weeks    Status New    Target Date 02/13/20             PT Long Term Goals - 02/01/20 1318      PT LONG TERM GOAL #1   Title Pt to be independent with final HEP    Time 6    Period Weeks    Status New    Target Date 03/12/20      PT LONG TERM GOAL #2   Title Pt to report pain in L shoulder to 0-2/10 with activity    Time 6    Period Weeks    Status New    Target Date 03/12/20      PT LONG TERM GOAL #3   Title Pt to demo improved AROM for L UE to be WNL to improve ability for IADLS and work duties.    Time 6    Period Weeks    Status New    Target Date 03/12/20      PT LONG TERM GOAL #4   Title Pt to demo improved strength of L shoulder to at least 4+/5 for all motions, to improve ability for reaching, lifting, carrying and IADLS.    Time 6    Period Weeks    Status New    Target Date 03/12/20                 Plan - 02/13/20 1356    Clinical Impression Statement Pt showing improvments with AAROM, and AROM in supine. Also improving with light functional AROM with daily activities. Ice and e-stim done at end of session to decrease and prevent pain.    Personal Factors and Comorbidities Fitness;Comorbidity 1    Comorbidities  RA, fibromyalgia,    Examination-Activity Limitations Lift;Bathing;Reach Overhead;Sleep;Carry;Dressing;Hygiene/Grooming    Examination-Participation Restrictions Cleaning;Community Activity;Shop;Meal Prep;Driving;Laundry;Yard Work     Stability/Clinical Decision Making Stable/Uncomplicated    Rehab Potential Good    PT Frequency 2x / week    PT Duration 6 weeks    PT Treatment/Interventions ADLs/Self Care Home Management;Cryotherapy;Electrical Stimulation;DME Instruction;Ultrasound;Moist Heat;Iontophoresis 4mg /ml Dexamethasone;Functional mobility training;Therapeutic activities;Therapeutic exercise;Neuromuscular re-education;Manual techniques;Patient/family education;Passive range of motion;Dry needling;Energy conservation;Joint Manipulations;Spinal Manipulations;Taping;Balance training    PT Home Exercise Plan [email or web address]    Consulted and Agree with Plan of Care Patient           Patient will benefit from skilled therapeutic intervention in order to improve the following deficits and impairments:  Decreased range of motion, Increased muscle spasms, Decreased activity tolerance, Pain, Impaired flexibility, Decreased strength, Decreased mobility, Impaired UE functional use, Decreased knowledge of precautions, Hypomobility  Visit Diagnosis: Acute pain of left shoulder  Muscle weakness (generalized)     Problem List Patient Active Problem List   Diagnosis Date Noted  . Immunosuppressed status ([place]) 12/26/2019  . Hyperlipidemia 12/25/2019  . Rheumatoid arthritis involving multiple sites with positive rheumatoid factor ([place]) 02/21/2019  . Vitamin B12 deficiency 02/14/2019  . Urge incontinence 12/20/2018  . Urinary frequency 12/20/2018  . Fibromyalgia 04/12/2018  . Vitamin D deficiency 04/12/2018  . Vaginal dryness 04/12/2018  . History of total hysterectomy 04/12/2018  . Insomnia 04/12/2018  . Morbid obesity ([place]) 04/12/2018  . History of colon polyps 04/12/2018  . Primary osteoarthritis of both knees 12/28/2017  . Cervicogenic headache 03/26/2014  . Cervical radiculopathy 03/26/2014  . Mild intermittent asthma 11/20/2013  . Bradycardia 07/04/2012    Lyndee Hensen, PT, DPT 1:58 PM  02/13/20    [facility] [place], [place], [address] Phone: [phone]   Fax:  [phone]  Name: Nancy [name] MRN: [id number] Date of Birth: 1966-06-19

## 2020-02-18 ENCOUNTER — Encounter: Payer: Self-pay | Admitting: Physical Therapy

## 2020-02-18 ENCOUNTER — Other Ambulatory Visit: Payer: Self-pay

## 2020-02-18 ENCOUNTER — Ambulatory Visit: Payer: 59 | Admitting: Physical Therapy

## 2020-02-18 DIAGNOSIS — M6281 Muscle weakness (generalized): Secondary | ICD-10-CM | POA: Diagnosis not present

## 2020-02-18 DIAGNOSIS — M25512 Pain in left shoulder: Secondary | ICD-10-CM | POA: Diagnosis not present

## 2020-02-18 NOTE — Therapy (Signed)
[facility] [address] [place], [place], [address] Phone: [phone]   Fax:  [phone]  Physical Therapy Treatment  Patient Details  Name: Nancy [name] MRN: [id number] Date of Birth: 03/29/1966 Referring Provider (PT): Eunice Blase   Encounter Date: 02/18/2020   PT End of Session - 02/18/20 1145    Visit Number 5    Number of Visits 12    Date for PT Re-Evaluation 03/12/20    Authorization Type [employer]    PT Start Time 1146    PT Stop Time 1240    PT Time Calculation (min) 54 min    Activity Tolerance Patient tolerated treatment well    Behavior During Therapy [facility] for tasks assessed/performed           Past Medical History:  Diagnosis Date  . Anemia   . Asthma   . Bradycardia   . Chicken pox   . Dysrhythmia    occ. palpitations  . Fibromyalgia   . Headache(784.0)   . Migraines   . Neck pain    bulging disk  . Polyarthralgia 02/21/2019  . PONV (postoperative nausea and vomiting)    sometimes wakes up after surgery and cannot breath due to asthma, can be bradycardic  . Rheumatoid arthritis involving multiple sites with positive rheumatoid factor ([place]) 02/21/2019  . Shortness of breath    with bradycardia    Past Surgical History:  Procedure Laterality Date  . ABDOMINAL HYSTERECTOMY    . BACK SURGERY     cervical fusion C5,6,7 with a bulge at C4  . BLADDER SURGERY  July 10th 2013  . [place]  . CYSTOSCOPY N/A 07/31/2018   Procedure: CYSTOSCOPY;  Surgeon: Bjorn Loser, MD;  [facility]: [facility];  Service: Urology;  Laterality: N/A;  . DIAGNOSTIC LAPAROSCOPY    . EXCISION OF MESH N/A 07/31/2018   Procedure: REMOVAL OF VAGINAL MESH;  Surgeon: Bjorn Loser, MD;  [facility];  Service: Urology;  Laterality: N/A;  . LAPAROSCOPY Bilateral 05/31/2013   Procedure: LAPAROSCOPY withbilateral salpingo-oophorectomy, lysis of adhesions;  Surgeon: Allena Katz, MD;   Location: [facility];  Service: Gynecology;  Laterality: Bilateral;  with clippings of Vaginal  mesh  . SALPINGOOPHORECTOMY Bilateral 05/31/2013   Procedure: BILATERAL SALPINGO OOPHORECTOMY/TRIM MESH;  Surgeon: Allena Katz, MD;  Location: [facility];  Service: Gynecology;  Laterality: Bilateral;  . TUBAL LIGATION     1995  . VAGINAL HYSTERECTOMY     1998 or 1997  . WRIST FRACTURE SURGERY  2005    There were no vitals filed for this visit.   Subjective Assessment - 02/18/20 1146    Subjective Patient states it is improving slowly    Patient Stated Goals decreased pain    Currently in Pain? Yes    Pain Score 3     Pain Location Shoulder    Pain Orientation Left    Pain Descriptors / Indicators Aching              [facility] PT Assessment - 02/18/20 0001      Strength   Overall Strength Comments ext 5/5, IR 4+/5, ER 4-/5                         [facility] Adult PT Treatment/Exercise - 02/18/20 0001      Self-Care   Self-Care Other Self-Care Comments    Other Self-Care Comments  MFR with ball to pectorals, upper left arm and lateral scapular muscles using ball against the wall      Shoulder Exercises: Supine   Horizontal ABduction 10 reps    Theraband Level (Shoulder Horizontal ABduction) Level 2 (Red)    External Rotation Both;10 reps    Theraband Level (Shoulder External Rotation) Level 2 (Red)    Flexion AAROM;10 reps    Flexion Limitations 1# flex to 90 deg x 15    Other Supine Exercises rythmic stab with arm at 90 deg flex      Shoulder Exercises: Standing   Flexion AAROM;5 reps    Flexion Limitations with stick painful     Extension 20 reps    Theraband Level (Shoulder Extension) Level 3 (Green)    Row 20 reps    Theraband Level (Shoulder Row) Level 3 (Green)    Other Standing Exercises with wash cloth x 10 increased pressure into door to       Shoulder Exercises: Pulleys   Flexion 1 minute    ABduction 1 minute      Shoulder Exercises: Stretch    Other Shoulder Stretches attempted pectoralis stretches in doorway in several positions but all painful; also ER stretch in supine with hand behind head.      Cryotherapy   Number Minutes Cryotherapy 10 Minutes    Cryotherapy Location Shoulder    Type of Cryotherapy Ice pack      Electrical Stimulation   Electrical Stimulation Location L Shoulder    Electrical Stimulation Action pre-mod    Electrical Stimulation Parameters x 73min    Electrical Stimulation Goals Pain      Manual Therapy   Soft tissue mobilization to left upper arm, subscapularis and pectorals for assessment mainly                    PT Short Term Goals - 02/01/20 1318      PT SHORT TERM GOAL #1   Title Pt to be independent with initial HEP    Time 2    Period Weeks    Status New    Target Date 02/13/20             PT Long Term Goals - 02/01/20 1318      PT LONG TERM GOAL #1   Title Pt to be independent with final HEP    Time 6    Period Weeks    Status New    Target Date 03/12/20      PT LONG TERM GOAL #2   Title Pt to report pain in L shoulder to 0-2/10 with activity    Time 6    Period Weeks    Status New    Target Date 03/12/20      PT LONG TERM GOAL #3   Title Pt to demo improved AROM for L UE to be WNL to improve ability for IADLS and work duties.    Time 6    Period Weeks    Status New    Target Date 03/12/20      PT LONG TERM GOAL #4   Title Pt to demo improved strength of L shoulder to at least 4+/5 for all motions, to improve ability for reaching, lifting, carrying and IADLS.    Time 6    Period Weeks    Status New    Target Date 03/12/20  Plan - 02/18/20 1246    Clinical Impression Statement Patient tolerant to strengthening in supine for ER and flexion, but still has pain with flex in standing and compensates with left upper trapezius.    PT Treatment/Interventions ADLs/Self Care Home Management;Cryotherapy;Electrical Stimulation;DME  Instruction;Ultrasound;Moist Heat;Iontophoresis 4mg /ml Dexamethasone;Functional mobility training;Therapeutic activities;Therapeutic exercise;Neuromuscular re-education;Manual techniques;Patient/family education;Passive range of motion;Dry needling;Energy conservation;Joint Manipulations;Spinal Manipulations;Taping;Balance training    PT Next Visit Plan continue ROM and strengthening in pain free ROM; possible DN    Consulted and Agree with Plan of Care Patient           Patient will benefit from skilled therapeutic intervention in order to improve the following deficits and impairments:  Decreased range of motion, Increased muscle spasms, Decreased activity tolerance, Pain, Impaired flexibility, Decreased strength, Decreased mobility, Impaired UE functional use, Decreased knowledge of precautions, Hypomobility  Visit Diagnosis: Acute pain of left shoulder  Muscle weakness (generalized)     Problem List Patient Active Problem List   Diagnosis Date Noted  . Immunosuppressed status ([place]) 12/26/2019  . Hyperlipidemia 12/25/2019  . Rheumatoid arthritis involving multiple sites with positive rheumatoid factor ([place]) 02/21/2019  . Vitamin B12 deficiency 02/14/2019  . Urge incontinence 12/20/2018  . Urinary frequency 12/20/2018  . Fibromyalgia 04/12/2018  . Vitamin D deficiency 04/12/2018  . Vaginal dryness 04/12/2018  . History of total hysterectomy 04/12/2018  . Insomnia 04/12/2018  . Morbid obesity ([place]) 04/12/2018  . History of colon polyps 04/12/2018  . Primary osteoarthritis of both knees 12/28/2017  . Cervicogenic headache 03/26/2014  . Cervical radiculopathy 03/26/2014  . Mild intermittent asthma 11/20/2013  . Bradycardia 07/04/2012   Madelyn Flavors PT 02/18/2020, 12:51 PM  [facility] [address] [place], [place], [address] Phone: [phone]   Fax:  [phone]  Name: Nancy [name] MRN: [id number] Date of Birth:  [birth year]

## 2020-02-20 ENCOUNTER — Encounter: Payer: 59 | Admitting: Physical Therapy

## 2020-02-24 ENCOUNTER — Encounter: Payer: Self-pay | Admitting: Physical Therapy

## 2020-02-24 ENCOUNTER — Ambulatory Visit: Payer: 59 | Admitting: Physical Therapy

## 2020-02-24 ENCOUNTER — Other Ambulatory Visit: Payer: Self-pay

## 2020-02-24 DIAGNOSIS — M6281 Muscle weakness (generalized): Secondary | ICD-10-CM | POA: Diagnosis not present

## 2020-02-24 DIAGNOSIS — M25512 Pain in left shoulder: Secondary | ICD-10-CM | POA: Diagnosis not present

## 2020-02-24 NOTE — Progress Notes (Signed)
NEUROLOGY CONSULTATION NOTE  Nancy [name] MRN: [id number] DOB: 12/20/1965  Referring provider: Brayton Mars. Yong Channel, MD Primary care provider: Brayton Mars. Yong Channel, MD  Reason for consult:  Right leg weakness  HISTORY OF PRESENT ILLNESS: Nancy [name] is a 54 year old right-handed female with rheumatoid arthritis who presents for right leg weakness.  History supplemented by Sports Medicine and referring provider's notes.  She started having progressive balance problems after she had a fall in October 2019, in which she sustained bursitis of her right hip.  She underwent physical therapy but her balance has progressively gotten worse.  She diagnosed with rheumatoid arthritis and has chronic pain involving her neck, low back, SI joints and knees.  She was on a prolonged course of prednisone and was weak.  She has been unable to go to the gym or exercise.  She has been seen by Sports Medicine for SI joint and knee injections.  She continues to have low back pain with pain radiating down the lateral side of her right leg, sometimes with numbness and tingling into the right foot.  She falls about once a month.  She just feels off balance.  She may trip just by taking a right step.  She denies dizziness or visual disturbance.  She denies chronic numbness in the feet.  MRI of cervical spine from 10/14/2015 personally reviewed showed cervical spondylosis with mild central disc herniation at C4-5 not causing neural compression, bilateral foraminal stenosis at C5-6 possibly effecting either C6 nerve roots.  PAST MEDICAL HISTORY: Past Medical History:  Diagnosis Date  . Anemia   . Asthma   . Bradycardia   . Chicken pox   . Dysrhythmia    occ. palpitations  . Fibromyalgia   . Headache(784.0)   . Migraines   . Neck pain    bulging disk  . Polyarthralgia 02/21/2019  . PONV (postoperative nausea and vomiting)    sometimes wakes up after surgery and cannot breath due to asthma, can be  bradycardic  . Rheumatoid arthritis involving multiple sites with positive rheumatoid factor ([place]) 02/21/2019  . Shortness of breath    with bradycardia    PAST SURGICAL HISTORY: Past Surgical History:  Procedure Laterality Date  . ABDOMINAL HYSTERECTOMY    . BACK SURGERY     cervical fusion C5,6,7 with a bulge at C4  . BLADDER SURGERY  July 10th 2013  . [place]  . CYSTOSCOPY N/A 07/31/2018   Procedure: CYSTOSCOPY;  Surgeon: Bjorn Loser, MD;  Location: [facility];  Service: Urology;  Laterality: N/A;  . DIAGNOSTIC LAPAROSCOPY    . EXCISION OF MESH N/A 07/31/2018   Procedure: REMOVAL OF VAGINAL MESH;  Surgeon: Bjorn Loser, MD;  Location: [facility];  Service: Urology;  Laterality: N/A;  . LAPAROSCOPY Bilateral 05/31/2013   Procedure: LAPAROSCOPY withbilateral salpingo-oophorectomy, lysis of adhesions;  Surgeon: Allena Katz, MD;  Location: [facility];  Service: Gynecology;  Laterality: Bilateral;  with clippings of Vaginal  mesh  . SALPINGOOPHORECTOMY Bilateral 05/31/2013   Procedure: BILATERAL SALPINGO OOPHORECTOMY/TRIM MESH;  Surgeon: Allena Katz, MD;  Location: [facility];  Service: Gynecology;  Laterality: Bilateral;  . TUBAL LIGATION     1995  . VAGINAL HYSTERECTOMY     1998 or 1997  . WRIST FRACTURE SURGERY  2005    MEDICATIONS: Current Outpatient Medications on File Prior to Visit  Medication Sig Dispense Refill  . acetaminophen (TYLENOL) 500 MG tablet  Take 500 mg by mouth every 6 (six) hours as needed (pain).     . Albuterol Sulfate (PROAIR RESPICLICK) 735 (90 Base) MCG/ACT AEPB Inhale 1 puff into the lungs every 6 (six) hours as needed. 1 each 3  . Cholecalciferol (VITAMIN D3) 125 MCG (5000 UT) CAPS Take by mouth daily.    . cyanocobalamin 1000 MCG tablet Take 1,000 mcg by mouth daily.    . Estradiol 10 MCG TABS vaginal tablet Place 1 tablet (10 mcg total) vaginally 2 (two) times a week. 24 tablet 3  . folic acid (FOLVITE) 329 MCG  tablet Take 1 mg by mouth daily.     . furosemide (LASIX) 20 MG tablet TAKE 1 TABLET BY MOUTH DAILY AS NEEDED FOR FLUID OR EDEMA (UP TO 3 DAYS). 30 tablet 1  . gabapentin (NEURONTIN) 300 MG capsule Take 1 capsule (300 mg total) by mouth 3 (three) times daily. 270 capsule 3  . hydrochlorothiazide (HYDRODIURIL) 25 MG tablet Take 1 tablet (25 mg total) by mouth daily as needed (fluid retention/edema). TAKE 1 TABLET BY MOUTH ONCE DAILY AS NEEDED FOR EDEMA 90 tablet 1  . levocetirizine (XYZAL) 5 MG tablet Take 1 tablet (5 mg total) by mouth every evening. 90 tablet 1  . methotrexate 2.5 MG tablet 8 tab weekly    . ondansetron (ZOFRAN) 4 MG tablet Take 1 tablet (4 mg total) by mouth every 8 (eight) hours as needed for nausea or vomiting. 20 tablet 0  . phentermine (ADIPEX-P) 37.5 MG tablet Take 1 tablet (37.5 mg total) by mouth daily before breakfast. 30 tablet 2  . predniSONE (DELTASONE) 10 MG tablet     . tiZANidine (ZANAFLEX) 4 MG tablet Take 1 tablet (4 mg total) by mouth every 8 (eight) hours as needed for muscle spasms. 30 tablet 1  . traMADol (ULTRAM) 50 MG tablet Take one-two tablets every 4-6 hours as needed for moderate pain. 180 tablet 1  . Turmeric 500 MG CAPS Take by mouth 2 (two) times daily.    Marland Kitchen Upadacitinib ER (RINVOQ) 15 MG TB24 Take 15 mg by mouth daily. 30 tablet 1  . valACYclovir (VALTREX) 1000 MG tablet Take two tablets ( total 2000 mg) by mouth q12h x 1 day; Start: ASAP after symptom onset 6 tablet 2  . vitamin C (ASCORBIC ACID) 500 MG tablet Take 500 mg by mouth as needed.     No current facility-administered medications on file prior to visit.    ALLERGIES: Allergies  Allergen Reactions  . Advair Diskus [Fluticasone-Salmeterol] Cough    excessive  . Erythromycin Diarrhea and Nausea And Vomiting  . Percocet [Oxycodone-Acetaminophen] Other (See Comments)    hallucinations    FAMILY HISTORY: Family History  Problem Relation Age of Onset  . Hyperlipidemia Mother         75 in 2019  . Arthritis Mother   . Osteoporosis Mother   . Lumbar disc disease Mother        herniated discs.   Marland Kitchen COPD Mother   . Hypertension Father        doesnt know his history well- no recent contact  . Heart disease Maternal Grandmother   . Diabetes Maternal Grandmother   . Cervical cancer Maternal Grandmother   . Heart disease Maternal Grandfather   . Cancer - Colon Maternal Grandfather   . Diabetes Paternal Grandmother   . Lung cancer Paternal Grandmother        dad's parents stepped in to help when dad not present  .  Other Sister        doesnt know history well  . Lumbar disc disease Brother        grew up with him   . Atrial fibrillation Paternal Grandfather        required a few shocks  . Bradycardia Paternal Grandfather   . Hypertension Paternal Grandfather   . Other Brother        doesnt know history well   SOCIAL HISTORY: Social History   Socioeconomic History  . Marital status: Married    Spouse name: Not on file  . Number of children: Not on file  . Years of education: Not on file  . Highest education level: Not on file  Occupational History  . Not on file  Tobacco Use  . Smoking status: Never Smoker  . Smokeless tobacco: Never Used  Vaping Use  . Vaping Use: Never used  Substance and Sexual Activity  . Alcohol use: Yes    Alcohol/week: 0.0 standard drinks    Comment: occasional; just on special occasion  . Drug use: No  . Sexual activity: Yes    Partners: Male    Birth control/protection: Surgical  Other Topics Concern  . Not on file  Social History Narrative   Married to husband Shanon Brow. Son Gerald Stabs and Daughter Reggy Eye.       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       Hobbies: movies, reading, hiking   Social Determinants of Health   Financial Resource Strain:   . Difficulty of Paying Living Expenses:   Food Insecurity:   . Worried About Charity fundraiser in the Last Year:   . Arboriculturist in the Last Year:   Transportation Needs:     . Film/video editor (Medical):   Marland Kitchen Lack of Transportation (Non-Medical):   Physical Activity:   . Days of Exercise per Week:   . Minutes of Exercise per Session:   Stress:   . Feeling of Stress :   Social Connections:   . Frequency of Communication with Friends and Family:   . Frequency of Social Gatherings with Friends and Family:   . Attends Religious Services:   . Active Member of Clubs or Organizations:   . Attends Archivist Meetings:   Marland Kitchen Marital Status:   Intimate Partner Violence:   . Fear of Current or Ex-Partner:   . Emotionally Abused:   Marland Kitchen Physically Abused:   . Sexually Abused:     PHYSICAL EXAM: Blood pressure (!) 134/81, pulse 64, height 5\' 2"  (1.575 m), weight (!) 256 lb 9.6 oz (116.4 kg), SpO2 95 %. General: No acute distress.  Patient appears well-groomed.  Head:  Normocephalic/atraumatic Eyes:  fundi examined but not visualized Neck: supple, no paraspinal tenderness, full range of motion Back: bilateral lower paraspinal tenderness Heart: regular rate and rhythm Lungs: Clear to auscultation bilaterally. Vascular: No carotid bruits. Neurological Exam: Mental status: alert and oriented to person, place, and time, recent and remote memory intact, fund of knowledge intact, attention and concentration intact, speech fluent and not dysarthric, language intact. Cranial nerves: CN I: not tested CN II: pupils equal, round and reactive to light, visual fields intact CN III, IV, VI:  full range of motion, no nystagmus, no ptosis CN V: facial sensation intact CN VII: upper and lower face symmetric CN VIII: hearing intact CN IX, X: gag intact, uvula midline CN XI: sternocleidomastoid and trapezius muscles intact CN XII: tongue midline Bulk & Tone:  normal, no fasciculations. Motor:  5/5 throughout  Sensation:  Pinprick and vibration sensation intact. Deep Tendon Reflexes:  2+ throughout, toes downgoing.  Finger to nose testing:  Without dysmetria.   Heel to shin:  Without dysmetria.  Gait:  Wide-based.  Able to turn.  Unable to tandem walk.  Romberg negative  IMPRESSION: Balance disorder.  No objective lower extremity muscle weakness appreciated.  No significant sensory loss in feet to suggest polyneuropathy.  No upper motor neuron signs to suggest myelopathy (such as due to cervical spinal stenosis).  She does endorse some right radicular symptoms, so lumbar spinal stenosis is possible.  May be complicated by decompensation as she has not been feeling well for over a year since diagnosis of RA, prior prednisone use, chronic pain and lack of exercise.  Since she has failed physical therapy and continues to have falls, will proceed with MRI lumbar spine  PLAN: 1.  MRI lumbar spine 2.  Further recommendations pending results. 3.  Follow up after testing  Thank you for allowing me to take part in the care of this patient.  Metta Clines, DO  CC:  Brayton Mars. Yong Channel, MD

## 2020-02-24 NOTE — Therapy (Signed)
[facility] [address] [place], [place], [address] Phone: [phone]   Fax:  [phone]  Physical Therapy Treatment  Patient Details  Name: Nancy [name] MRN: [id number] Date of Birth: 20-Jan-1966 Referring Provider (PT): Eunice Blase   Encounter Date: 02/24/2020   PT End of Session - 02/24/20 1213    Visit Number 6    Number of Visits 12    Date for PT Re-Evaluation 03/12/20    Authorization Type [facility]    PT Start Time 1209    PT Stop Time 1300    PT Time Calculation (min) 51 min    Activity Tolerance Patient tolerated treatment well    Behavior During Therapy [facility] for tasks assessed/performed           Past Medical History:  Diagnosis Date  . Anemia   . Asthma   . Bradycardia   . Chicken pox   . Dysrhythmia    occ. palpitations  . Fibromyalgia   . Headache(784.0)   . Migraines   . Neck pain    bulging disk  . Polyarthralgia 02/21/2019  . PONV (postoperative nausea and vomiting)    sometimes wakes up after surgery and cannot breath due to asthma, can be bradycardic  . Rheumatoid arthritis involving multiple sites with positive rheumatoid factor ([place]) 02/21/2019  . Shortness of breath    with bradycardia    Past Surgical History:  Procedure Laterality Date  . ABDOMINAL HYSTERECTOMY    . BACK SURGERY     cervical fusion C5,6,7 with a bulge at C4  . BLADDER SURGERY  July 10th 2013  . [place]  . CYSTOSCOPY N/A 07/31/2018   Procedure: CYSTOSCOPY;  Surgeon: Bjorn Loser, MD;  Location: [facility];  Service: Urology;  Laterality: N/A;  . DIAGNOSTIC LAPAROSCOPY    . EXCISION OF MESH N/A 07/31/2018   Procedure: REMOVAL OF VAGINAL MESH;  Surgeon: Bjorn Loser, MD;  Location: [facility];  Service: Urology;  Laterality: N/A;  . LAPAROSCOPY Bilateral 05/31/2013   Procedure: LAPAROSCOPY withbilateral salpingo-oophorectomy, lysis of adhesions;  Surgeon: Allena Katz, MD;   Location: [facility];  Service: Gynecology;  Laterality: Bilateral;  with clippings of Vaginal  mesh  . SALPINGOOPHORECTOMY Bilateral 05/31/2013   Procedure: BILATERAL SALPINGO OOPHORECTOMY/TRIM MESH;  Surgeon: Allena Katz, MD;  Location: [facility];  Service: Gynecology;  Laterality: Bilateral;  . TUBAL LIGATION     1995  . VAGINAL HYSTERECTOMY     1998 or 1997  . WRIST FRACTURE SURGERY  2005    There were no vitals filed for this visit.   Subjective Assessment - 02/24/20 1212    Subjective Pt states difficulty and pain  with trying to lift arm    Currently in Pain? Yes    Pain Location Shoulder    Pain Orientation Left    Pain Descriptors / Indicators Aching    Pain Type Acute pain                             [facility] Adult PT Treatment/Exercise - 02/24/20 1216      Shoulder Exercises: Supine   Horizontal ABduction 10 reps    Theraband Level (Shoulder Horizontal ABduction) Level 2 (Red)    External Rotation Both;10 reps    External Rotation Weight (lbs) 2    Flexion AROM;15 reps    Shoulder Flexion Weight (lbs)  1    Other Supine Exercises flys1lb x 15; horiz abd RTB x 15;       Shoulder Exercises: Sidelying   External Rotation 20 reps    External Rotation Weight (lbs) 1      Shoulder Exercises: Standing   External Rotation 15 reps    Theraband Level (Shoulder External Rotation) Level 2 (Red)    Internal Rotation 15 reps    Theraband Level (Shoulder Internal Rotation) Level 2 (Red)    Flexion 15 reps;AROM    Flexion Limitations --    Row 20 reps    Theraband Level (Shoulder Row) Level 3 (Green)    Other Standing Exercises wall wash x10, circles x 10 bil, Ball rolls on wall for stabs x20;       Shoulder Exercises: Pulleys   Flexion 1 minute    ABduction 1 minute      Shoulder Exercises: Stretch   Other Shoulder Stretches --      Cryotherapy   Number Minutes Cryotherapy 10 Minutes    Cryotherapy Location Shoulder    Type of Cryotherapy Ice pack       Electrical Stimulation   Electrical Stimulation Location L Shoulder    Electrical Stimulation Action pre-mod    Electrical Stimulation Parameters x10     Electrical Stimulation Goals Pain      Manual Therapy   Soft tissue mobilization --                    PT Short Term Goals - 02/01/20 1318      PT SHORT TERM GOAL #1   Title Pt to be independent with initial HEP    Time 2    Period Weeks    Status New    Target Date 02/13/20             PT Long Term Goals - 02/01/20 1318      PT LONG TERM GOAL #1   Title Pt to be independent with final HEP    Time 6    Period Weeks    Status New    Target Date 03/12/20      PT LONG TERM GOAL #2   Title Pt to report pain in L shoulder to 0-2/10 with activity    Time 6    Period Weeks    Status New    Target Date 03/12/20      PT LONG TERM GOAL #3   Title Pt to demo improved AROM for L UE to be WNL to improve ability for IADLS and work duties.    Time 6    Period Weeks    Status New    Target Date 03/12/20      PT LONG TERM GOAL #4   Title Pt to demo improved strength of L shoulder to at least 4+/5 for all motions, to improve ability for reaching, lifting, carrying and IADLS.    Time 6    Period Weeks    Status New    Target Date 03/12/20                 Plan - 02/24/20 1404    Clinical Impression Statement Pt improving with PROM and AAROM, and ability for light strengthening. She states pain with attempts for AROM and repeated motions wtih AAROM. ER motion and strength improving. Pt with most pain in mid bicep region in arm. Pt to benefit from continued focus on restoring full AROM without pain.  PT Treatment/Interventions ADLs/Self Care Home Management;Cryotherapy;Electrical Stimulation;DME Instruction;Ultrasound;Moist Heat;Iontophoresis 4mg /ml Dexamethasone;Functional mobility training;Therapeutic activities;Therapeutic exercise;Neuromuscular re-education;Manual techniques;Patient/family  education;Passive range of motion;Dry needling;Energy conservation;Joint Manipulations;Spinal Manipulations;Taping;Balance training    PT Next Visit Plan continue ROM and strengthening in pain free ROM; possible DN    Consulted and Agree with Plan of Care Patient           Patient will benefit from skilled therapeutic intervention in order to improve the following deficits and impairments:  Decreased range of motion, Increased muscle spasms, Decreased activity tolerance, Pain, Impaired flexibility, Decreased strength, Decreased mobility, Impaired UE functional use, Decreased knowledge of precautions, Hypomobility  Visit Diagnosis: Acute pain of left shoulder  Muscle weakness (generalized)     Problem List Patient Active Problem List   Diagnosis Date Noted  . Immunosuppressed status ([place]) 12/26/2019  . Hyperlipidemia 12/25/2019  . Rheumatoid arthritis involving multiple sites with positive rheumatoid factor ([place]) 02/21/2019  . Vitamin B12 deficiency 02/14/2019  . Urge incontinence 12/20/2018  . Urinary frequency 12/20/2018  . Fibromyalgia 04/12/2018  . Vitamin D deficiency 04/12/2018  . Vaginal dryness 04/12/2018  . History of total hysterectomy 04/12/2018  . Insomnia 04/12/2018  . Morbid obesity ([place]) 04/12/2018  . History of colon polyps 04/12/2018  . Primary osteoarthritis of both knees 12/28/2017  . Cervicogenic headache 03/26/2014  . Cervical radiculopathy 03/26/2014  . Mild intermittent asthma 11/20/2013  . Bradycardia 07/04/2012    Lyndee Hensen, PT, DPT 2:07 PM  02/24/20    [facility] [place], [place], [address] Phone: [phone]   Fax:  [phone]  Name: Laasia Arcos MRN: [id number] Date of Birth: 07-Mar-1966

## 2020-02-25 ENCOUNTER — Ambulatory Visit (INDEPENDENT_AMBULATORY_CARE_PROVIDER_SITE_OTHER): Payer: 59 | Admitting: Neurology

## 2020-02-25 ENCOUNTER — Encounter: Payer: Self-pay | Admitting: Neurology

## 2020-02-25 VITALS — BP 134/81 | HR 64 | Ht 62.0 in | Wt 256.6 lb

## 2020-02-25 DIAGNOSIS — R29898 Other symptoms and signs involving the musculoskeletal system: Secondary | ICD-10-CM | POA: Diagnosis not present

## 2020-02-25 DIAGNOSIS — R2689 Other abnormalities of gait and mobility: Secondary | ICD-10-CM | POA: Diagnosis not present

## 2020-02-25 DIAGNOSIS — G8929 Other chronic pain: Secondary | ICD-10-CM

## 2020-02-25 DIAGNOSIS — M5441 Lumbago with sciatica, right side: Secondary | ICD-10-CM | POA: Diagnosis not present

## 2020-02-25 NOTE — Patient Instructions (Addendum)
We will first check MRI of lumbar spine. We have sent a referral to [facility] for your MRI and they will call you directly to schedule your appointment. They are located at [place]. If you need to contact them directly please call [phone].  Further recommendations pending results.

## 2020-02-26 MED FILL — predniSONE 10 MG TABS: 10 | 30 days supply | Qty: 30 | Fill #2

## 2020-02-27 ENCOUNTER — Encounter: Payer: Self-pay | Admitting: Physical Therapy

## 2020-02-27 ENCOUNTER — Other Ambulatory Visit: Payer: Self-pay

## 2020-02-27 ENCOUNTER — Ambulatory Visit (INDEPENDENT_AMBULATORY_CARE_PROVIDER_SITE_OTHER): Payer: 59 | Admitting: Physical Therapy

## 2020-02-27 DIAGNOSIS — M25512 Pain in left shoulder: Secondary | ICD-10-CM | POA: Diagnosis not present

## 2020-02-27 DIAGNOSIS — M6281 Muscle weakness (generalized): Secondary | ICD-10-CM | POA: Diagnosis not present

## 2020-02-27 NOTE — Therapy (Signed)
[facility] [address] [place], [place], [address] Phone: [phone]   Fax:  [phone]  Physical Therapy Treatment  Patient Details  Name: Nancy [name] MRN: [id number] Date of Birth: 03-01-1966 Referring Provider (PT): Eunice Blase   Encounter Date: 02/27/2020   PT End of Session - 02/27/20 1544    Visit Number 7    Number of Visits 12    Date for PT Re-Evaluation 03/12/20    Authorization Type [employer]    PT Start Time 1215    PT Stop Time 1306    PT Time Calculation (min) 51 min    Activity Tolerance Patient tolerated treatment well    Behavior During Therapy [facility] for tasks assessed/performed           Past Medical History:  Diagnosis Date   Anemia    Asthma    Bradycardia    Chicken pox    Dysrhythmia    occ. palpitations   Fibromyalgia    Headache(784.0)    Migraines    Neck pain    bulging disk   Polyarthralgia 02/21/2019   PONV (postoperative nausea and vomiting)    sometimes wakes up after surgery and cannot breath due to asthma, can be bradycardic   Rheumatoid arthritis involving multiple sites with positive rheumatoid factor ([place]) 02/21/2019   Shortness of breath    with bradycardia    Past Surgical History:  Procedure Laterality Date   ABDOMINAL HYSTERECTOMY     BACK SURGERY     cervical fusion C5,6,7 with a bulge at [place]  July 10th 2013   [place] N/A 07/31/2018   Procedure: CYSTOSCOPY;  Surgeon: Bjorn Loser, MD;  Location: [facility];  Service: Urology;  Laterality: N/A;   DIAGNOSTIC LAPAROSCOPY     EXCISION OF MESH N/A 07/31/2018   Procedure: REMOVAL OF VAGINAL MESH;  Surgeon: Bjorn Loser, MD;  Location: [facility];  Service: Urology;  Laterality: N/A;   LAPAROSCOPY Bilateral 05/31/2013   Procedure: LAPAROSCOPY withbilateral salpingo-oophorectomy, lysis of adhesions;  Surgeon: Allena Katz, MD;   Location: [facility];  Service: Gynecology;  Laterality: Bilateral;  with clippings of Vaginal  mesh   SALPINGOOPHORECTOMY Bilateral 05/31/2013   Procedure: BILATERAL SALPINGO OOPHORECTOMY/TRIM MESH;  Surgeon: Allena Katz, MD;  Location: [facility];  Service: Gynecology;  Laterality: Bilateral;   [place] or [place]  2005    There were no vitals filed for this visit.   Subjective Assessment - 02/27/20 1543    Subjective Pt states less pain after last session. Still having pain in humerus, and pain with lifting and lowering arm.    Currently in Pain? Yes    Pain Location Shoulder    Pain Orientation Left    Pain Descriptors / Indicators Aching    Pain Type Acute pain    Pain Onset More than a month ago    Pain Frequency Intermittent                             [facility] Adult PT Treatment/Exercise - 02/27/20 1226      Shoulder Exercises: Supine   Horizontal ABduction --    Theraband Level (Shoulder Horizontal ABduction) --    External Rotation Both;10 reps  External Rotation Weight (lbs) 2    Flexion AROM;15 reps    Shoulder Flexion Weight (lbs) 2    Other Supine Exercises AROM/ 1/2 reclined flexion x20;       Shoulder Exercises: Sidelying   External Rotation --    External Rotation Weight (lbs) --      Shoulder Exercises: Standing   External Rotation 15 reps    Theraband Level (Shoulder External Rotation) Level 2 (Red)    Internal Rotation 15 reps    Theraband Level (Shoulder Internal Rotation) Level 2 (Red)    Flexion 15 reps;AROM    Row 20 reps    Theraband Level (Shoulder Row) Level 3 (Green)    Other Standing Exercises wall wash x10, circles x 10 bil, Ball rolls on wall for stabs x20;  Wall push ups x 15;       Shoulder Exercises: Pulleys   Flexion 1 minute    ABduction 1 minute      Cryotherapy   Number Minutes Cryotherapy 10 Minutes    Cryotherapy Location Shoulder     Type of Cryotherapy Ice pack      Electrical Stimulation   Electrical Stimulation Location --    Electrical Stimulation Goals --      Manual Therapy   Joint Mobilization inf and post mobs gr 3 ; L     Passive ROM L shoulder, all motions to tolerance                     PT Short Term Goals - 02/01/20 1318      PT SHORT TERM GOAL #1   Title Pt to be independent with initial HEP    Time 2    Period Weeks    Status New    Target Date 02/13/20             PT Long Term Goals - 02/01/20 1318      PT LONG TERM GOAL #1   Title Pt to be independent with final HEP    Time 6    Period Weeks    Status New    Target Date 03/12/20      PT LONG TERM GOAL #2   Title Pt to report pain in L shoulder to 0-2/10 with activity    Time 6    Period Weeks    Status New    Target Date 03/12/20      PT LONG TERM GOAL #3   Title Pt to demo improved AROM for L UE to be WNL to improve ability for IADLS and work duties.    Time 6    Period Weeks    Status New    Target Date 03/12/20      PT LONG TERM GOAL #4   Title Pt to demo improved strength of L shoulder to at least 4+/5 for all motions, to improve ability for reaching, lifting, carrying and IADLS.    Time 6    Period Weeks    Status New    Target Date 03/12/20                 Plan - 02/27/20 1545    Clinical Impression Statement Pt improving with ability for AROM in supine, and did well with 1/2 reclined position today. Progressing well, but Still having pain and difficulty with AROM in standing, due to pain wiht elevation and eccentric lowering. Most pain in humerus vs shoulder. Will f/u with MD tomorrow.  PT Treatment/Interventions ADLs/Self Care Home Management;Cryotherapy;Electrical Stimulation;DME Instruction;Ultrasound;Moist Heat;Iontophoresis 4mg /ml Dexamethasone;Functional mobility training;Therapeutic activities;Therapeutic exercise;Neuromuscular re-education;Manual techniques;Patient/family  education;Passive range of motion;Dry needling;Energy conservation;Joint Manipulations;Spinal Manipulations;Taping;Balance training    PT Next Visit Plan continue ROM and strengthening in pain free ROM; possible DN    Consulted and Agree with Plan of Care Patient           Patient will benefit from skilled therapeutic intervention in order to improve the following deficits and impairments:  Decreased range of motion, Increased muscle spasms, Decreased activity tolerance, Pain, Impaired flexibility, Decreased strength, Decreased mobility, Impaired UE functional use, Decreased knowledge of precautions, Hypomobility  Visit Diagnosis: Acute pain of left shoulder  Muscle weakness (generalized)     Problem List Patient Active Problem List   Diagnosis Date Noted   Immunosuppressed status ([place]) 12/26/2019   Hyperlipidemia 12/25/2019   Rheumatoid arthritis involving multiple sites with positive rheumatoid factor ([place]) 02/21/2019   Vitamin B12 deficiency 02/14/2019   Urge incontinence 12/20/2018   Urinary frequency 12/20/2018   Fibromyalgia 04/12/2018   Vitamin D deficiency 04/12/2018   Vaginal dryness 04/12/2018   History of total hysterectomy 04/12/2018   Insomnia 04/12/2018   Morbid obesity ([place]) 04/12/2018   History of colon polyps 04/12/2018   Primary osteoarthritis of both knees 12/28/2017   Cervicogenic headache 03/26/2014   Cervical radiculopathy 03/26/2014   Mild intermittent asthma 11/20/2013   Bradycardia 07/04/2012    Lyndee Hensen, PT, DPT 3:47 PM  02/27/20    [facility] [address] [place], [place], [address] Phone: [phone]   Fax:  [phone]  Name: Tymia Streb MRN: [id number] Date of Birth: October 18, 1965

## 2020-02-28 ENCOUNTER — Encounter: Payer: Self-pay | Admitting: Family Medicine

## 2020-02-28 ENCOUNTER — Ambulatory Visit (INDEPENDENT_AMBULATORY_CARE_PROVIDER_SITE_OTHER): Payer: 59 | Admitting: Family Medicine

## 2020-02-28 ENCOUNTER — Ambulatory Visit: Payer: Self-pay

## 2020-02-28 DIAGNOSIS — S42255D Nondisplaced fracture of greater tuberosity of left humerus, subsequent encounter for fracture with routine healing: Secondary | ICD-10-CM | POA: Diagnosis not present

## 2020-02-28 DIAGNOSIS — M7989 Other specified soft tissue disorders: Secondary | ICD-10-CM | POA: Diagnosis not present

## 2020-02-28 DIAGNOSIS — M79602 Pain in left arm: Secondary | ICD-10-CM

## 2020-02-28 NOTE — Progress Notes (Signed)
Office Visit Note   Patient: Nancy [name]           Date of Birth: Apr 02, 1966           MRN: [id number] Visit Date: 02/28/2020 Requested by: Marin Olp, MD [place],  [place] [id number] PCP: Marin Olp, MD  Subjective: Chief Complaint  Patient presents with  . Left Upper Arm - Fracture, Follow-up    Still doing PT, icing for the swelling, and Tramadol bid - no longer taking Motrin in between. Having pain in the biceps region - sharp pains with certain movements, especially while standing.    HPI: She is about 7 weeks status post fall resulting in left shoulder greater tuberosity humerus fracture.  She is making progress with range of motion and therapy, but active range of motion is still very painful.  She has a tender spot in the anterior aspect of her shoulder.               ROS:   All other systems were reviewed and are negative.  Objective: Vital Signs: There were no vitals taken for this visit.  Physical Exam:  General:  Alert and oriented, in no acute distress. Pulm:  Breathing unlabored. Psy:  Normal mood, congruent affect. Skin: Resolving ecchymosis in the anterior shoulder. Left shoulder: She has almost full passive range of motion today.  She has good strength with resisted rotator cuff testing.  There is some pain still with empty can test.  Speeds test negative.  She is point tender over a tiny subcutaneous nodule in the anterior upper biceps area.  Imaging: US Guided Needle Placement  Result Date: 02/28/2020 Diagnostic ultrasound left shoulder: Long head biceps tendon has some fluid in the sheath but it is located in its groove and does not look torn.  Subscapularis tendon looks grossly normal.  Supraspinatus may have some intrasubstance partial tearing toward the posterior aspect but I do not think she has a full-thickness tear.  The bony callus is evident at the greater tuberosity.  The superficial nodule at the area of  tenderness appears to be associated with flow on power Doppler, I question whether she may have a superficial thrombus.   Assessment & Plan: 1.  Clinically healing left greater tuberosity humerus fracture 7 weeks status post fall -She will keep working on physical therapy for strengthening.  Could do a subacromial injection if she reaches a plateau.  2.  Superficial shoulder pain, question thrombus. -We will schedule vascular Doppler.     Procedures: No procedures performed  No notes on file     PMFS History: Patient Active Problem List   Diagnosis Date Noted  . Immunosuppressed status ([place]) 12/26/2019  . Hyperlipidemia 12/25/2019  . Rheumatoid arthritis involving multiple sites with positive rheumatoid factor ([place]) 02/21/2019  . Vitamin B12 deficiency 02/14/2019  . Urge incontinence 12/20/2018  . Urinary frequency 12/20/2018  . Fibromyalgia 04/12/2018  . Vitamin D deficiency 04/12/2018  . Vaginal dryness 04/12/2018  . History of total hysterectomy 04/12/2018  . Insomnia 04/12/2018  . Morbid obesity ([place]) 04/12/2018  . History of colon polyps 04/12/2018  . Primary osteoarthritis of both knees 12/28/2017  . Cervicogenic headache 03/26/2014  . Cervical radiculopathy 03/26/2014  . Mild intermittent asthma 11/20/2013  . Bradycardia 07/04/2012   Past Medical History:  Diagnosis Date  . Anemia   . Asthma   . Bradycardia   . Chicken pox   . Dysrhythmia    occ.  palpitations  . Fibromyalgia   . Headache(784.0)   . Migraines   . Neck pain    bulging disk  . Polyarthralgia 02/21/2019  . PONV (postoperative nausea and vomiting)    sometimes wakes up after surgery and cannot breath due to asthma, can be bradycardic  . Rheumatoid arthritis involving multiple sites with positive rheumatoid factor ([place]) 02/21/2019  . Shortness of breath    with bradycardia    Family History  Problem Relation Age of Onset  . Hyperlipidemia Mother        66 in 2019  . Arthritis Mother     . Osteoporosis Mother   . Lumbar disc disease Mother        herniated discs.   Marland Kitchen COPD Mother   . Hypertension Father        doesnt know his history well- no recent contact  . Heart disease Maternal Grandmother   . Diabetes Maternal Grandmother   . Cervical cancer Maternal Grandmother   . Heart disease Maternal Grandfather   . Cancer - Colon Maternal Grandfather   . Diabetes Paternal Grandmother   . Lung cancer Paternal Grandmother        dad's parents stepped in to help when dad not present  . Other Sister        doesnt know history well  . Lumbar disc disease Brother        grew up with him   . Atrial fibrillation Paternal Grandfather        required a few shocks  . Bradycardia Paternal Grandfather   . Hypertension Paternal Grandfather   . Other Brother        doesnt know history well    Past Surgical History:  Procedure Laterality Date  . ABDOMINAL HYSTERECTOMY    . BACK SURGERY     cervical fusion C5,6,7 with a bulge at C4  . BLADDER SURGERY  July 10th 2013  . [place]  . CYSTOSCOPY N/A 07/31/2018   Procedure: CYSTOSCOPY;  Surgeon: Bjorn Loser, MD;  [facility];  Service: Urology;  Laterality: N/A;  . DIAGNOSTIC LAPAROSCOPY    . EXCISION OF MESH N/A 07/31/2018   Procedure: REMOVAL OF VAGINAL MESH;  Surgeon: Bjorn Loser, MD;  [facility];  Service: Urology;  Laterality: N/A;  . LAPAROSCOPY Bilateral 05/31/2013   Procedure: LAPAROSCOPY withbilateral salpingo-oophorectomy, lysis of adhesions;  Surgeon: Allena Katz, MD;  Location: [facility];  Service: Gynecology;  Laterality: Bilateral;  with clippings of Vaginal  mesh  . SALPINGOOPHORECTOMY Bilateral 05/31/2013   Procedure: BILATERAL SALPINGO OOPHORECTOMY/TRIM MESH;  Surgeon: Allena Katz, MD;  Location: [facility];  Service: Gynecology;  Laterality: Bilateral;  . TUBAL LIGATION     1995  . VAGINAL HYSTERECTOMY     1998 or 1997  . WRIST FRACTURE SURGERY  2005    Social History   Occupational History  . Not on file  Tobacco Use  . Smoking status: Never Smoker  . Smokeless tobacco: Never Used  Vaping Use  . Vaping Use: Never used  Substance and Sexual Activity  . Alcohol use: Yes    Alcohol/week: 0.0 standard drinks    Comment: occasional; just on special occasion  . Drug use: No  . Sexual activity: Yes    Partners: Male    Birth control/protection: Surgical

## 2020-03-02 ENCOUNTER — Other Ambulatory Visit: Payer: Self-pay | Admitting: Family Medicine

## 2020-03-02 ENCOUNTER — Telehealth: Payer: Self-pay | Admitting: *Deleted

## 2020-03-02 DIAGNOSIS — M79602 Pain in left arm: Secondary | ICD-10-CM

## 2020-03-02 DIAGNOSIS — M7989 Other specified soft tissue disorders: Secondary | ICD-10-CM

## 2020-03-02 NOTE — Telephone Encounter (Signed)
-----   Message from Eula Fried sent at 03/02/2020 11:20 AM EDT ----- Patient called needing a doppler venous scheduled for tomorrow first thing in the morning or after 12:00pm.  Thanks  Joycelyn Schmid

## 2020-03-02 NOTE — Telephone Encounter (Signed)
I called [facility] and left vm to return my call to try to get this pt scheduled.

## 2020-03-03 ENCOUNTER — Ambulatory Visit (HOSPITAL_COMMUNITY)
Admission: RE | Admit: 2020-03-03 | Discharge: 2020-03-03 | Disposition: A | Discharge status: Home / Self Care | Payer: 59 | Source: Ambulatory Visit | Attending: Family Medicine | Admitting: Family Medicine

## 2020-03-03 ENCOUNTER — Other Ambulatory Visit: Payer: Self-pay

## 2020-03-03 ENCOUNTER — Telehealth: Payer: Self-pay | Admitting: Family Medicine

## 2020-03-03 DIAGNOSIS — M79602 Pain in left arm: Secondary | ICD-10-CM | POA: Diagnosis not present

## 2020-03-03 DIAGNOSIS — M7989 Other specified soft tissue disorders: Secondary | ICD-10-CM | POA: Insufficient documentation

## 2020-03-03 NOTE — Progress Notes (Signed)
Left upper extremity venous duplex has been completed. Preliminary results can be found in CV Proc through chart review.  Results were faxed to Dr. Junius Roads' office.  03/03/20 8:23 AM Nancy [name] RVT

## 2020-03-03 NOTE — Telephone Encounter (Signed)
Doppler is negative for clot.

## 2020-03-04 ENCOUNTER — Other Ambulatory Visit: Payer: Self-pay | Admitting: Physician Assistant

## 2020-03-04 ENCOUNTER — Encounter: Payer: 59 | Admitting: Physical Therapy

## 2020-03-04 MED ORDER — VALACYCLOVIR HCL 1 G PO TABS
1000.0000 mg | ORAL_TABLET | Freq: Three times a day (TID) | ORAL | 0 refills | Status: AC
Start: 2020-03-04 — End: 2020-03-11

## 2020-03-04 MED FILL — valACYclovir HCL 1 GM TABS: 1 | 7 days supply | Qty: 21 | Fill #0

## 2020-03-05 ENCOUNTER — Ambulatory Visit (INDEPENDENT_AMBULATORY_CARE_PROVIDER_SITE_OTHER): Payer: 59 | Admitting: Physical Therapy

## 2020-03-05 ENCOUNTER — Other Ambulatory Visit: Payer: Self-pay

## 2020-03-05 DIAGNOSIS — M25512 Pain in left shoulder: Secondary | ICD-10-CM

## 2020-03-05 DIAGNOSIS — M6281 Muscle weakness (generalized): Secondary | ICD-10-CM | POA: Diagnosis not present

## 2020-03-08 ENCOUNTER — Encounter: Payer: Self-pay | Admitting: Physical Therapy

## 2020-03-08 NOTE — Therapy (Signed)
[facility] [address] [place], [place], [address] Phone: [phone]   Fax:  [phone]  Physical Therapy Treatment  Patient Details  Name: Nancy [name] MRN: [id number] Date of Birth: [birth year] Referring Provider (PT): Eunice Blase   Encounter Date: 03/05/2020   PT End of Session - 03/08/20 2206    Visit Number 8    Number of Visits 12    Date for PT Re-Evaluation 03/12/20    Authorization Type [employer]    PT Start Time 1100    PT Stop Time 1140    PT Time Calculation (min) 40 min    Activity Tolerance Patient tolerated treatment well    Behavior During Therapy [facility] for tasks assessed/performed           Past Medical History:  Diagnosis Date  . Anemia   . Asthma   . Bradycardia   . Chicken pox   . Dysrhythmia    occ. palpitations  . Fibromyalgia   . Headache(784.0)   . Migraines   . Neck pain    bulging disk  . Polyarthralgia 02/21/2019  . PONV (postoperative nausea and vomiting)    sometimes wakes up after surgery and cannot breath due to asthma, can be bradycardic  . Rheumatoid arthritis involving multiple sites with positive rheumatoid factor ([place]) 02/21/2019  . Shortness of breath    with bradycardia    Past Surgical History:  Procedure Laterality Date  . ABDOMINAL HYSTERECTOMY    . BACK SURGERY     cervical fusion C5,6,7 with a bulge at C4  . BLADDER SURGERY  July 10th 2013  . [place]  . CYSTOSCOPY N/A 07/31/2018   Procedure: CYSTOSCOPY;  Surgeon: Bjorn Loser, MD;  [facility];  Service: Urology;  Laterality: N/A;  . DIAGNOSTIC LAPAROSCOPY    . EXCISION OF MESH N/A 07/31/2018   Procedure: REMOVAL OF VAGINAL MESH;  Surgeon: Bjorn Loser, MD;  [facility];  Service: Urology;  Laterality: N/A;  . LAPAROSCOPY Bilateral 05/31/2013   Procedure: LAPAROSCOPY withbilateral salpingo-oophorectomy, lysis of adhesions;  Surgeon: Allena Katz, MD;   Location: [facility];  Service: Gynecology;  Laterality: Bilateral;  with clippings of Vaginal  mesh  . SALPINGOOPHORECTOMY Bilateral 05/31/2013   Procedure: BILATERAL SALPINGO OOPHORECTOMY/TRIM MESH;  Surgeon: Allena Katz, MD;  Location: [facility];  Service: Gynecology;  Laterality: Bilateral;  . TUBAL LIGATION     1995  . VAGINAL HYSTERECTOMY     1998 or 1997  . WRIST FRACTURE SURGERY  2005    There were no vitals filed for this visit.   Subjective Assessment - 03/08/20 2205    Subjective Pt states improving function, but still feels limited by pain in humerus. Doppler was negative. Pt with MD appt this week for possible injection.    Patient Stated Goals decreased pain    Currently in Pain? Yes    Pain Score 3     Pain Location Shoulder    Pain Orientation Left    Pain Descriptors / Indicators Aching    Pain Type Acute pain    Pain Onset More than a month ago    Pain Frequency Intermittent                             [facility] Adult PT Treatment/Exercise - 03/08/20 0001      Shoulder Exercises: Supine  External Rotation Both;10 reps    External Rotation Weight (lbs) 2    Flexion AROM;15 reps    Shoulder Flexion Weight (lbs) 2    Other Supine Exercises AROM/ 1/2 reclined flexion x20;       Shoulder Exercises: Standing   External Rotation 15 reps    Theraband Level (Shoulder External Rotation) Level 3 (Green)    Internal Rotation 15 reps    Theraband Level (Shoulder Internal Rotation) Level 3 (Green)    Flexion AROM;10 reps    Row 20 reps    Theraband Level (Shoulder Row) Level 3 (Green)    Other Standing Exercises wall wash x10, circles x 10 bil, Ball rolls on wall for stabs x20;        Shoulder Exercises: Pulleys   Flexion 1 minute      Shoulder Exercises: Stretch   Corner Stretch 2 reps;30 seconds    Corner Stretch Limitations doorway      Modalities   Modalities Iontophoresis      Cryotherapy   Cryotherapy Location Shoulder       Iontophoresis   Type of Iontophoresis Dexamethasone    Location L humerus    Time 6 hr patch      Manual Therapy   Joint Mobilization inf and post mobs gr 3 ; L     Soft tissue mobilization DTM and IASTM to bicep and mid humerus     Passive ROM L shoulder, all motions to tolerance                   PT Education - 03/08/20 2206    Education Details HEp reviewed    Person(s) Educated Patient    Methods Explanation;Demonstration;Verbal cues;Handout    Comprehension Verbalized understanding;Returned demonstration;Verbal cues required;Need further instruction            PT Short Term Goals - 02/01/20 1318      PT SHORT TERM GOAL #1   Title Pt to be independent with initial HEP    Time 2    Period Weeks    Status New    Target Date 02/13/20             PT Long Term Goals - 02/01/20 1318      PT LONG TERM GOAL #1   Title Pt to be independent with final HEP    Time 6    Period Weeks    Status New    Target Date 03/12/20      PT LONG TERM GOAL #2   Title Pt to report pain in L shoulder to 0-2/10 with activity    Time 6    Period Weeks    Status New    Target Date 03/12/20      PT LONG TERM GOAL #3   Title Pt to demo improved AROM for L UE to be WNL to improve ability for IADLS and work duties.    Time 6    Period Weeks    Status New    Target Date 03/12/20      PT LONG TERM GOAL #4   Title Pt to demo improved strength of L shoulder to at least 4+/5 for all motions, to improve ability for reaching, lifting, carrying and IADLS.    Time 6    Period Weeks    Status New    Target Date 03/12/20                 Plan - 03/08/20  2208    Clinical Impression Statement Pt continues to have painful area at mid humerus with palpation and with elevation/deceleration. Function and ability for AROM is improving. Plan to progress as tolerated with AAROM, AROM and strengthening.    PT Treatment/Interventions ADLs/Self Care Home  Management;Cryotherapy;Electrical Stimulation;DME Instruction;Ultrasound;Moist Heat;Iontophoresis 4mg /ml Dexamethasone;Functional mobility training;Therapeutic activities;Therapeutic exercise;Neuromuscular re-education;Manual techniques;Patient/family education;Passive range of motion;Dry needling;Energy conservation;Joint Manipulations;Spinal Manipulations;Taping;Balance training    PT Next Visit Plan continue ROM and strengthening in pain free ROM; possible DN    Consulted and Agree with Plan of Care Patient           Patient will benefit from skilled therapeutic intervention in order to improve the following deficits and impairments:  Decreased range of motion, Increased muscle spasms, Decreased activity tolerance, Pain, Impaired flexibility, Decreased strength, Decreased mobility, Impaired UE functional use, Decreased knowledge of precautions, Hypomobility  Visit Diagnosis: Acute pain of left shoulder  Muscle weakness (generalized)     Problem List Patient Active Problem List   Diagnosis Date Noted  . Immunosuppressed status ([place]) 12/26/2019  . Hyperlipidemia 12/25/2019  . Rheumatoid arthritis involving multiple sites with positive rheumatoid factor ([place]) 02/21/2019  . Vitamin B12 deficiency 02/14/2019  . Urge incontinence 12/20/2018  . Urinary frequency 12/20/2018  . Fibromyalgia 04/12/2018  . Vitamin D deficiency 04/12/2018  . Vaginal dryness 04/12/2018  . History of total hysterectomy 04/12/2018  . Insomnia 04/12/2018  . Morbid obesity ([place]) 04/12/2018  . History of colon polyps 04/12/2018  . Primary osteoarthritis of both knees 12/28/2017  . Cervicogenic headache 03/26/2014  . Cervical radiculopathy 03/26/2014  . Mild intermittent asthma 11/20/2013  . Bradycardia 07/04/2012   Lyndee Hensen, PT, DPT 10:10 PM  03/08/20    [facility] [place], [place], [address] Phone: [phone]   Fax:   [phone]  Name: Nancy [name] MRN: [id number] Date of Birth: 12-06-1965

## 2020-03-09 ENCOUNTER — Encounter: Payer: 59 | Admitting: Physical Therapy

## 2020-03-10 ENCOUNTER — Other Ambulatory Visit: Payer: Self-pay | Admitting: Pharmacist

## 2020-03-10 MED ORDER — RINVOQ 15 MG PO TB24: 15.0000 mg | ORAL_TABLET | Freq: Every day | ORAL | 0 refills | Status: DC

## 2020-03-11 ENCOUNTER — Ambulatory Visit: Payer: Self-pay

## 2020-03-11 ENCOUNTER — Encounter: Payer: Self-pay | Admitting: Family Medicine

## 2020-03-11 ENCOUNTER — Ambulatory Visit (INDEPENDENT_AMBULATORY_CARE_PROVIDER_SITE_OTHER): Payer: 59 | Admitting: Family Medicine

## 2020-03-11 ENCOUNTER — Other Ambulatory Visit: Payer: Self-pay

## 2020-03-11 DIAGNOSIS — S42255D Nondisplaced fracture of greater tuberosity of left humerus, subsequent encounter for fracture with routine healing: Secondary | ICD-10-CM

## 2020-03-11 NOTE — Progress Notes (Signed)
Office Visit Note   Patient: Nancy [name]           Date of Birth: 05-Sep-1965           MRN: [id number] Visit Date: 03/11/2020 Requested by: Marin Olp, MD [place],  [place] [address] PCP: Marin Olp, MD  Subjective: Chief Complaint  Patient presents with  . Left Shoulder - Pain, Follow-up    Glenohumeral cortisone injection    HPI: She is about 9 weeks status post left shoulder greater tuberosity humerus fracture.  Still having pain in the anterior shoulder and a little bit in the lateral shoulder.  Range of motion is slowly improving.              ROS:   All other systems were reviewed and are negative.  Objective: Vital Signs: There were no vitals taken for this visit.  Physical Exam:  General:  Alert and oriented, in no acute distress. Pulm:  Breathing unlabored. Psy:  Normal mood, congruent affect.  Left shoulder: Almost full range of motion with overhead reach.  Improved range of motion with behind the back reach.  Rotator cuff strength is 5/5 but she has pain with empty can test.  She is tender in the lateral subacromial space and continues to have a tender nodule in the anterior shoulder biceps area.  Imaging: No results found.  Assessment & Plan: 1.  Clinically healed left shoulder greater tuberosity humerus fracture with persistent impingement type pain and myofascial pain. -We will inject the lateral subacromial space and the tender anterior shoulder nodule today.  If this helps, she will follow-up as needed.     Procedures: Left shoulder injection:  After sterile prep, injected 4 cc 1% lidocaine without epi and 20 mg methylprednisolone into the subacromial bursa using ultrasound guidance, then injected the same amounts into the tender anterior biceps-area nodule.   PMFS History: Patient Active Problem List   Diagnosis Date Noted  . Immunosuppressed status ([place]) 12/26/2019  . Hyperlipidemia 12/25/2019  . Rheumatoid  arthritis involving multiple sites with positive rheumatoid factor ([place]) 02/21/2019  . Vitamin B12 deficiency 02/14/2019  . Urge incontinence 12/20/2018  . Urinary frequency 12/20/2018  . Fibromyalgia 04/12/2018  . Vitamin D deficiency 04/12/2018  . Vaginal dryness 04/12/2018  . History of total hysterectomy 04/12/2018  . Insomnia 04/12/2018  . Morbid obesity ([place]) 04/12/2018  . History of colon polyps 04/12/2018  . Primary osteoarthritis of both knees 12/28/2017  . Cervicogenic headache 03/26/2014  . Cervical radiculopathy 03/26/2014  . Mild intermittent asthma 11/20/2013  . Bradycardia 07/04/2012   Past Medical History:  Diagnosis Date  . Anemia   . Asthma   . Bradycardia   . Chicken pox   . Dysrhythmia    occ. palpitations  . Fibromyalgia   . Headache(784.0)   . Migraines   . Neck pain    bulging disk  . Polyarthralgia 02/21/2019  . PONV (postoperative nausea and vomiting)    sometimes wakes up after surgery and cannot breath due to asthma, can be bradycardic  . Rheumatoid arthritis involving multiple sites with positive rheumatoid factor ([place]) 02/21/2019  . Shortness of breath    with bradycardia    Family History  Problem Relation Age of Onset  . Hyperlipidemia Mother        56 in 2019  . Arthritis Mother   . Osteoporosis Mother   . Lumbar disc disease Mother        herniated  discs.   Marland Kitchen COPD Mother   . Hypertension Father        doesnt know his history well- no recent contact  . Heart disease Maternal Grandmother   . Diabetes Maternal Grandmother   . Cervical cancer Maternal Grandmother   . Heart disease Maternal Grandfather   . Cancer - Colon Maternal Grandfather   . Diabetes Paternal Grandmother   . Lung cancer Paternal Grandmother        dad's parents stepped in to help when dad not present  . Other Sister        doesnt know history well  . Lumbar disc disease Brother        grew up with him   . Atrial fibrillation Paternal Grandfather         required a few shocks  . Bradycardia Paternal Grandfather   . Hypertension Paternal Grandfather   . Other Brother        doesnt know history well    Past Surgical History:  Procedure Laterality Date  . ABDOMINAL HYSTERECTOMY    . BACK SURGERY     cervical fusion C5,6,7 with a bulge at C4  . BLADDER SURGERY  July 10th 2013  . [place]  . CYSTOSCOPY N/A 07/31/2018   Procedure: CYSTOSCOPY;  Surgeon: Bjorn Loser, MD;  Location: [facility];  Service: Urology;  Laterality: N/A;  . DIAGNOSTIC LAPAROSCOPY    . EXCISION OF MESH N/A 07/31/2018   Procedure: REMOVAL OF VAGINAL MESH;  Surgeon: Bjorn Loser, MD;  Location: [facility];  Service: Urology;  Laterality: N/A;  . LAPAROSCOPY Bilateral 05/31/2013   Procedure: LAPAROSCOPY withbilateral salpingo-oophorectomy, lysis of adhesions;  Surgeon: Allena Katz, MD;  Location: [facility];  Service: Gynecology;  Laterality: Bilateral;  with clippings of Vaginal  mesh  . SALPINGOOPHORECTOMY Bilateral 05/31/2013   Procedure: BILATERAL SALPINGO OOPHORECTOMY/TRIM MESH;  Surgeon: Allena Katz, MD;  Location: [facility];  Service: Gynecology;  Laterality: Bilateral;  . TUBAL LIGATION     1995  . VAGINAL HYSTERECTOMY     1998 or 1997  . WRIST FRACTURE SURGERY  2005   Social History   Occupational History  . Not on file  Tobacco Use  . Smoking status: Never Smoker  . Smokeless tobacco: Never Used  Vaping Use  . Vaping Use: Never used  Substance and Sexual Activity  . Alcohol use: Yes    Alcohol/week: 0.0 standard drinks    Comment: occasional; just on special occasion  . Drug use: No  . Sexual activity: Yes    Partners: Male    Birth control/protection: Surgical

## 2020-03-12 ENCOUNTER — Encounter: Payer: 59 | Admitting: Physical Therapy

## 2020-03-16 ENCOUNTER — Ambulatory Visit: Payer: 59 | Admitting: Physical Therapy

## 2020-03-16 ENCOUNTER — Other Ambulatory Visit: Payer: Self-pay

## 2020-03-16 DIAGNOSIS — E785 Hyperlipidemia, unspecified: Secondary | ICD-10-CM

## 2020-03-16 DIAGNOSIS — M0579 Rheumatoid arthritis with rheumatoid factor of multiple sites without organ or systems involvement: Secondary | ICD-10-CM

## 2020-03-16 DIAGNOSIS — M25512 Pain in left shoulder: Secondary | ICD-10-CM | POA: Diagnosis not present

## 2020-03-16 DIAGNOSIS — M6281 Muscle weakness (generalized): Secondary | ICD-10-CM | POA: Diagnosis not present

## 2020-03-17 ENCOUNTER — Other Ambulatory Visit: Payer: 59

## 2020-03-17 DIAGNOSIS — M0579 Rheumatoid arthritis with rheumatoid factor of multiple sites without organ or systems involvement: Secondary | ICD-10-CM

## 2020-03-17 DIAGNOSIS — E785 Hyperlipidemia, unspecified: Secondary | ICD-10-CM | POA: Diagnosis not present

## 2020-03-17 LAB — COMPREHENSIVE METABOLIC PANEL
AG Ratio: 1.7 (calc) (ref 1.0–2.5)
ALT: 41 U/L — ABNORMAL HIGH (ref 6–29)
AST: 32 U/L (ref 10–35)
Albumin: 4.6 g/dL (ref 3.6–5.1)
Alkaline phosphatase (APISO): 77 U/L (ref 37–153)
BUN: 23 mg/dL (ref 7–25)
CO2: 31 mmol/L (ref 20–32)
Calcium: 10.1 mg/dL (ref 8.6–10.4)
Chloride: 98 mmol/L (ref 98–110)
Creat: 0.89 mg/dL (ref 0.50–1.05)
Globulin: 2.7 g/dL (calc) (ref 1.9–3.7)
Glucose, Bld: 94 mg/dL (ref 65–99)
Potassium: 4.4 mmol/L (ref 3.5–5.3)
Sodium: 136 mmol/L (ref 135–146)
Total Bilirubin: 0.5 mg/dL (ref 0.2–1.2)
Total Protein: 7.3 g/dL (ref 6.1–8.1)

## 2020-03-17 LAB — CBC WITH DIFFERENTIAL/PLATELET
Absolute Monocytes: 742 cells/uL (ref 200–950)
Basophils Absolute: 22 cells/uL (ref 0–200)
Basophils Relative: 0.3 %
Eosinophils Absolute: 58 cells/uL (ref 15–500)
Eosinophils Relative: 0.8 %
HCT: 37.6 % (ref 35.0–45.0)
Hemoglobin: 12.4 g/dL (ref 11.7–15.5)
Lymphs Abs: 1692 cells/uL (ref 850–3900)
MCH: 31.6 pg (ref 27.0–33.0)
MCHC: 33 g/dL (ref 32.0–36.0)
MCV: 95.9 fL (ref 80.0–100.0)
MPV: 12.3 fL (ref 7.5–12.5)
Monocytes Relative: 10.3 %
Neutro Abs: 4687 cells/uL (ref 1500–7800)
Neutrophils Relative %: 65.1 %
Platelets: 278 10*3/uL (ref 140–400)
RBC: 3.92 10*6/uL (ref 3.80–5.10)
RDW: 13.5 % (ref 11.0–15.0)
Total Lymphocyte: 23.5 %
WBC: 7.2 10*3/uL (ref 3.8–10.8)

## 2020-03-17 LAB — SEDIMENTATION RATE: Sed Rate: 29 mm/h (ref 0–30)

## 2020-03-17 LAB — LIPID PANEL
Cholesterol: 273 mg/dL — ABNORMAL HIGH (ref <200)
HDL: 114 mg/dL (ref >=50)
LDL Cholesterol (Calc): 139 mg/dL (calc) — ABNORMAL HIGH
Non-HDL Cholesterol (Calc): 159 mg/dL (calc) — ABNORMAL HIGH (ref <130)
Total CHOL/HDL Ratio: 2.4 (calc) (ref <5.0)
Triglycerides: 98 mg/dL (ref <150)

## 2020-03-18 ENCOUNTER — Encounter: Payer: Self-pay | Admitting: Physical Therapy

## 2020-03-18 NOTE — Therapy (Signed)
[facility] [address] [place], [place], [address] Phone: [phone]   Fax:  [phone]  Physical Therapy Treatment/ Re-cert   Patient Details  Name: Nancy [name] MRN: [id number] Date of Birth: [birth year] Referring Provider (PT): Eunice Blase   Encounter Date: 03/16/2020   PT End of Session - 03/18/20 1409    Visit Number 9    Number of Visits 20    Date for PT Re-Evaluation 04/27/20    Authorization Type [employer] [employer]    PT Start Time 1217    PT Stop Time 1258    PT Time Calculation (min) 41 min    Activity Tolerance Patient tolerated treatment well    Behavior During Therapy [facility] for tasks assessed/performed           Past Medical History:  Diagnosis Date  . Anemia   . Asthma   . Bradycardia   . Chicken pox   . Dysrhythmia    occ. palpitations  . Fibromyalgia   . Headache(784.0)   . Migraines   . Neck pain    bulging disk  . Polyarthralgia 02/21/2019  . PONV (postoperative nausea and vomiting)    sometimes wakes up after surgery and cannot breath due to asthma, can be bradycardic  . Rheumatoid arthritis involving multiple sites with positive rheumatoid factor ([place]) 02/21/2019  . Shortness of breath    with bradycardia    Past Surgical History:  Procedure Laterality Date  . ABDOMINAL HYSTERECTOMY    . BACK SURGERY     cervical fusion C5,6,7 with a bulge at C4  . BLADDER SURGERY  July 10th 2013  . [name]  . CYSTOSCOPY N/A 07/31/2018   Procedure: CYSTOSCOPY;  Surgeon: Bjorn Loser, MD;  [facility];  Service: Urology;  Laterality: N/A;  . DIAGNOSTIC LAPAROSCOPY    . EXCISION OF MESH N/A 07/31/2018   Procedure: REMOVAL OF VAGINAL MESH;  Surgeon: Bjorn Loser, MD;  [facility];  Service: Urology;  Laterality: N/A;  . LAPAROSCOPY Bilateral 05/31/2013   Procedure: LAPAROSCOPY withbilateral salpingo-oophorectomy, lysis of adhesions;  Surgeon: Allena Katz,  MD;  Location: [facility];  Service: Gynecology;  Laterality: Bilateral;  with clippings of Vaginal  mesh  . SALPINGOOPHORECTOMY Bilateral 05/31/2013   Procedure: BILATERAL SALPINGO OOPHORECTOMY/TRIM MESH;  Surgeon: Allena Katz, MD;  Location: [facility];  Service: Gynecology;  Laterality: Bilateral;  . TUBAL LIGATION     1995  . VAGINAL HYSTERECTOMY     1998 or 1997  . WRIST FRACTURE SURGERY  2005    There were no vitals filed for this visit.   Subjective Assessment - 03/18/20 1409    Subjective Pt had injection. Feels it has helped some, feels she is moving arm better, but still weak/sore.    Currently in Pain? Yes    Pain Location Shoulder    Pain Orientation Left    Pain Descriptors / Indicators Aching    Pain Type Acute pain              [facility] PT Assessment - 03/18/20 0001      ROM / Strength   AROM / PROM / Strength Strength;AROM      AROM   Overall AROM Comments Mild limitation for full elevation, increased pain and fatigue with repeated motions for flex and abd.       PROM   Overall PROM Comments [facility]  Strength   Strength Assessment Site Shoulder    Right/Left Shoulder Left    Left Shoulder Flexion 3+/5    Left Shoulder ABduction 3+/5    Left Shoulder Internal Rotation 4/5    Left Shoulder External Rotation 4/5                         [facility] Adult PT Treatment/Exercise - 03/18/20 0001      Shoulder Exercises: Supine   External Rotation Both;15 reps    External Rotation Weight (lbs) 2      Shoulder Exercises: Standing   External Rotation 15 reps    Theraband Level (Shoulder External Rotation) Level 3 (Green)    Internal Rotation 15 reps    Theraband Level (Shoulder Internal Rotation) Level 3 (Green)    Flexion AROM;10 reps    ABduction AROM;10 reps    Row 20 reps    Theraband Level (Shoulder Row) Level 3 (Green)    Other Standing Exercises wall wash x10, circles x 10 bil, wall push ups x 20;       Shoulder Exercises: Stretch    Corner Stretch 2 reps;30 seconds    Corner Stretch Limitations doorway      Modalities   Modalities Iontophoresis      Cryotherapy   Cryotherapy Location Shoulder      Manual Therapy   Joint Mobilization inf and post mobs gr 3 ; L     Soft tissue mobilization DTM and IASTM to bicep and mid humerus     Passive ROM L shoulder, all motions to tolerance                   PT Education - 03/18/20 1409    Education Details HEP reviewed    Person(s) Educated Patient    Methods Demonstration;Explanation;Verbal cues    Comprehension Verbalized understanding;Returned demonstration;Verbal cues required            PT Short Term Goals - 03/18/20 1411      PT SHORT TERM GOAL #1   Title Pt to be independent with initial HEP    Time 2    Period Weeks    Status Achieved    Target Date 02/13/20             PT Long Term Goals - 03/18/20 1411      PT LONG TERM GOAL #1   Title Pt to be independent with final HEP    Time 6    Period Weeks    Status Partially Met      PT LONG TERM GOAL #2   Title Pt to report pain in L shoulder to 0-2/10 with activity    Time 6    Period Weeks    Status Partially Met      PT LONG TERM GOAL #3   Title Pt to demo improved AROM for L UE to be WNL to improve ability for IADLS and work duties.    Time 6    Period Weeks    Status Partially Met      PT LONG TERM GOAL #4   Title Pt to demo improved strength of L shoulder to at least 4+/5 for all motions, to improve ability for reaching, lifting, carrying and IADLS.    Time 6    Period Weeks    Status Partially Met                 Plan -  03/18/20 1412    Clinical Impression Statement Pt continues to make improvments with ROM and function. Improvements have been somewhat slower than expected for injury. Pt with continued soreness in anterior shoulder/humerus, that has been limiting for strength and ROM progressions, but overall is improving. Pt to benefit from continuation of skilled  PT to improve ability for full , pain free elevation, ROM, and strength.    PT Frequency 2x / week    PT Duration 6 weeks    PT Treatment/Interventions ADLs/Self Care Home Management;Cryotherapy;Electrical Stimulation;DME Instruction;Ultrasound;Moist Heat;Iontophoresis 56m/ml Dexamethasone;Functional mobility training;Therapeutic activities;Therapeutic exercise;Neuromuscular re-education;Manual techniques;Patient/family education;Passive range of motion;Dry needling;Energy conservation;Joint Manipulations;Spinal Manipulations;Taping;Balance training    PT Next Visit Plan continue ROM and strengthening in pain free ROM; possible DN    Consulted and Agree with Plan of Care Patient           Patient will benefit from skilled therapeutic intervention in order to improve the following deficits and impairments:  Decreased range of motion, Increased muscle spasms, Decreased activity tolerance, Pain, Impaired flexibility, Decreased strength, Decreased mobility, Impaired UE functional use, Decreased knowledge of precautions, Hypomobility  Visit Diagnosis: Acute pain of left shoulder  Muscle weakness (generalized)     Problem List Patient Active Problem List   Diagnosis Date Noted  . Immunosuppressed status ([place] 12/26/2019  . Hyperlipidemia 12/25/2019  . Rheumatoid arthritis involving multiple sites with positive rheumatoid factor ([place] 02/21/2019  . Vitamin B12 deficiency 02/14/2019  . Urge incontinence 12/20/2018  . Urinary frequency 12/20/2018  . Fibromyalgia 04/12/2018  . Vitamin D deficiency 04/12/2018  . Vaginal dryness 04/12/2018  . History of total hysterectomy 04/12/2018  . Insomnia 04/12/2018  . Morbid obesity ([place] 04/12/2018  . History of colon polyps 04/12/2018  . Primary osteoarthritis of both knees 12/28/2017  . Cervicogenic headache 03/26/2014  . Cervical radiculopathy 03/26/2014  . Mild intermittent asthma 11/20/2013  . Bradycardia 07/04/2012   LLyndee Hensen PT,  DPT 2:32 PM  03/18/20   [facility] [place] [address]Phone: [phone]  Fax:  [phone] Name: DCarolyn SylviaMRN: [id number] of Birth: [birth year]

## 2020-03-19 ENCOUNTER — Ambulatory Visit: Payer: 59 | Admitting: Physical Therapy

## 2020-03-19 ENCOUNTER — Other Ambulatory Visit: Payer: Self-pay

## 2020-03-19 ENCOUNTER — Encounter: Payer: Self-pay | Admitting: Physical Therapy

## 2020-03-19 DIAGNOSIS — M25512 Pain in left shoulder: Secondary | ICD-10-CM | POA: Diagnosis not present

## 2020-03-19 DIAGNOSIS — M6281 Muscle weakness (generalized): Secondary | ICD-10-CM | POA: Diagnosis not present

## 2020-03-19 DIAGNOSIS — R748 Abnormal levels of other serum enzymes: Secondary | ICD-10-CM | POA: Diagnosis not present

## 2020-03-19 DIAGNOSIS — M1990 Unspecified osteoarthritis, unspecified site: Secondary | ICD-10-CM | POA: Diagnosis not present

## 2020-03-19 DIAGNOSIS — Z79899 Other long term (current) drug therapy: Secondary | ICD-10-CM | POA: Diagnosis not present

## 2020-03-19 DIAGNOSIS — M0579 Rheumatoid arthritis with rheumatoid factor of multiple sites without organ or systems involvement: Secondary | ICD-10-CM | POA: Diagnosis not present

## 2020-03-19 DIAGNOSIS — M797 Fibromyalgia: Secondary | ICD-10-CM | POA: Diagnosis not present

## 2020-03-19 MED FILL — valACYclovir HCL 1 GM TABS: 1 | 7 days supply | Qty: 21 | Fill #0

## 2020-03-19 MED FILL — METHOTREXATE SODIUM 2.5 MG: 2.5 | 35 days supply | Qty: 40 | Fill #0

## 2020-03-19 MED FILL — RINVOQ 15 MG TB24: 15 | 30 days supply | Qty: 30 | Fill #0

## 2020-03-19 NOTE — Therapy (Signed)
[facility] [address] [place], [place], [address] Phone: [phone]   Fax:  [phone]  Physical Therapy Treatment  Patient Details  Name: Nancy [name] MRN: [id number] Date of Birth: 09/21/1965 Referring Provider (PT): Lavada Mesi   Encounter Date: 03/19/2020   PT End of Session - 03/19/20 1312    Visit Number 10    Number of Visits 20    Date for PT Re-Evaluation 04/27/20    Authorization Type [employer] [employer]    PT Start Time 1215    PT Stop Time 1257    PT Time Calculation (min) 42 min    Activity Tolerance Patient tolerated treatment well    Behavior During Therapy [facility] for tasks assessed/performed           Past Medical History:  Diagnosis Date  . Anemia   . Asthma   . Bradycardia   . Chicken pox   . Dysrhythmia    occ. palpitations  . Fibromyalgia   . Headache(784.0)   . Migraines   . Neck pain    bulging disk  . Polyarthralgia 02/21/2019  . PONV (postoperative nausea and vomiting)    sometimes wakes up after surgery and cannot breath due to asthma, can be bradycardic  . Rheumatoid arthritis involving multiple sites with positive rheumatoid factor ([facility]) 02/21/2019  . Shortness of breath    with bradycardia    Past Surgical History:  Procedure Laterality Date  . ABDOMINAL HYSTERECTOMY    . BACK SURGERY     cervical fusion C5,6,7 with a bulge at C4  . BLADDER SURGERY  July 10th 2013  . CESAREAN SECTION     1992 and 1995  . CYSTOSCOPY N/A 07/31/2018   Procedure: CYSTOSCOPY;  Surgeon: Alfredo Martinez, MD;  [facility]: [facility];  Service: Urology;  Laterality: N/A;  . DIAGNOSTIC LAPAROSCOPY    . EXCISION OF MESH N/A 07/31/2018   Procedure: REMOVAL OF VAGINAL MESH;  Surgeon: Alfredo Martinez, MD;  [facility];  Service: Urology;  Laterality: N/A;  . LAPAROSCOPY Bilateral 05/31/2013   Procedure: LAPAROSCOPY withbilateral salpingo-oophorectomy, lysis of adhesions;  Surgeon: Leslie Andrea, MD;   Location: [facility];  Service: Gynecology;  Laterality: Bilateral;  with clippings of Vaginal  mesh  . SALPINGOOPHORECTOMY Bilateral 05/31/2013   Procedure: BILATERAL SALPINGO OOPHORECTOMY/TRIM MESH;  Surgeon: Leslie Andrea, MD;  Location: [facility];  Service: Gynecology;  Laterality: Bilateral;  . TUBAL LIGATION     1995  . VAGINAL HYSTERECTOMY     1998 or 1997  . WRIST FRACTURE SURGERY  2005    There were no vitals filed for this visit.   Subjective Assessment - 03/19/20 1312    Subjective Pt continues to have pain in anterior shoulderwith movement and elevation.    Currently in Pain? Yes    Pain Location Shoulder    Pain Orientation Left    Pain Descriptors / Indicators Aching    Pain Type Acute pain    Pain Onset More than a month ago    Pain Frequency Intermittent                             [facility] Adult PT Treatment/Exercise - 03/19/20 1220      Shoulder Exercises: Supine   External Rotation Both;15 reps    External Rotation Weight (lbs) 2    External Rotation Limitations at 90 deg    Other Supine Exercises  Horizontal abd x 15;  chest press 2 lb x 15;     Other Supine Exercises Pec stretch 2 min;       Shoulder Exercises: Standing   External Rotation 15 reps    Theraband Level (Shoulder External Rotation) Level 2 (Red)    Internal Rotation 15 reps    Theraband Level (Shoulder Internal Rotation) Level 3 (Green)    Flexion AROM;10 reps    ABduction AROM;10 reps    Row 20 reps    Theraband Level (Shoulder Row) Level 3 (Green)    Other Standing Exercises --      Shoulder Exercises: ROM/Strengthening   UBE (Upper Arm Bike) L1 x 3 min;       Shoulder Exercises: Stretch   Corner Stretch 2 reps;30 seconds    Corner Stretch Limitations doorway      Modalities   Modalities Iontophoresis      Cryotherapy   Cryotherapy Location Shoulder      Iontophoresis   Type of Iontophoresis Dexamethasone    Location L anterior shoulder     Time 6 hr patch       Manual Therapy   Joint Mobilization inf and post mobs gr 3 ; L     Soft tissue mobilization DTM and IASTM to pec, bicep and mid humerus     Passive ROM L shoulder, all motions to tolerance                     PT Short Term Goals - 03/18/20 1411      PT SHORT TERM GOAL #1   Title Pt to be independent with initial HEP    Time 2    Period Weeks    Status Achieved    Target Date 02/13/20             PT Long Term Goals - 03/18/20 1411      PT LONG TERM GOAL #1   Title Pt to be independent with final HEP    Time 6    Period Weeks    Status Partially Met    Target Date 04/27/20      PT LONG TERM GOAL #2   Title Pt to report pain in L shoulder to 0-2/10 with activity    Time 6    Period Weeks    Status Partially Met    Target Date 04/27/20      PT LONG TERM GOAL #3   Title Pt to demo improved AROM for L UE to be WNL to improve ability for IADLS and work duties.    Time 6    Period Weeks    Status Partially Met    Target Date 04/27/20      PT LONG TERM GOAL #4   Title Pt to demo improved strength of L shoulder to at least 4+/5 for all motions, to improve ability for reaching, lifting, carrying and IADLS.    Time 6    Period Weeks    Status Partially Met    Target Date 04/27/20                 Plan - 03/19/20 1313    Clinical Impression Statement Pt improving with joint mobility, ROM, and strength, but is limited with strength progressions due to continued pain in anterior shoulder. Pt will call MD to report continued pain after injection.    PT Frequency 2x / week    PT Duration 6 weeks  PT Treatment/Interventions ADLs/Self Care Home Management;Cryotherapy;Electrical Stimulation;DME Instruction;Ultrasound;Moist Heat;Iontophoresis 4mg /ml Dexamethasone;Functional mobility training;Therapeutic activities;Therapeutic exercise;Neuromuscular re-education;Manual techniques;Patient/family education;Passive range of motion;Dry needling;Energy  conservation;Joint Manipulations;Spinal Manipulations;Taping;Balance training    PT Next Visit Plan continue ROM and strengthening in pain free ROM; possible DN    Consulted and Agree with Plan of Care Patient           Patient will benefit from skilled therapeutic intervention in order to improve the following deficits and impairments:  Decreased range of motion, Increased muscle spasms, Decreased activity tolerance, Pain, Impaired flexibility, Decreased strength, Decreased mobility, Impaired UE functional use, Decreased knowledge of precautions, Hypomobility  Visit Diagnosis: Acute pain of left shoulder  Muscle weakness (generalized)     Problem List Patient Active Problem List   Diagnosis Date Noted  . Immunosuppressed status ([place]) 12/26/2019  . Hyperlipidemia 12/25/2019  . Rheumatoid arthritis involving multiple sites with positive rheumatoid factor ([place]) 02/21/2019  . Vitamin B12 deficiency 02/14/2019  . Urge incontinence 12/20/2018  . Urinary frequency 12/20/2018  . Fibromyalgia 04/12/2018  . Vitamin D deficiency 04/12/2018  . Vaginal dryness 04/12/2018  . History of total hysterectomy 04/12/2018  . Insomnia 04/12/2018  . Morbid obesity ([place]) 04/12/2018  . History of colon polyps 04/12/2018  . Primary osteoarthritis of both knees 12/28/2017  . Cervicogenic headache 03/26/2014  . Cervical radiculopathy 03/26/2014  . Mild intermittent asthma 11/20/2013  . Bradycardia 07/04/2012    Lyndee Hensen, PT, DPT 1:14 PM  03/19/20    [facility], [place], [address] Phone: [phone]   Fax:  [phone]  Name: Nancy [name] MRN: [id number] Date of Birth: 11-Dec-1965

## 2020-03-20 ENCOUNTER — Telehealth: Payer: Self-pay | Admitting: Family Medicine

## 2020-03-20 DIAGNOSIS — M25512 Pain in left shoulder: Secondary | ICD-10-CM

## 2020-03-20 NOTE — Telephone Encounter (Signed)
Please advise 

## 2020-03-20 NOTE — Telephone Encounter (Signed)
Lvm informing pt.

## 2020-03-20 NOTE — Telephone Encounter (Signed)
MRI ordered

## 2020-03-20 NOTE — Telephone Encounter (Signed)
Patient called.   She was told to call back with the status of her condition and she has not gotten any better. She would like to proceed with getting set up for the MRI  Call back: [phone]

## 2020-03-20 NOTE — Addendum Note (Signed)
Addended by: Hortencia Pilar on: 03/20/2020 10:12 AM   Modules accepted: Orders

## 2020-03-23 ENCOUNTER — Other Ambulatory Visit: Payer: 59

## 2020-03-24 ENCOUNTER — Ambulatory Visit: Payer: 59 | Admitting: Physical Therapy

## 2020-03-24 ENCOUNTER — Other Ambulatory Visit: Payer: Self-pay

## 2020-03-24 ENCOUNTER — Encounter: Payer: Self-pay | Admitting: Physical Therapy

## 2020-03-24 DIAGNOSIS — M25512 Pain in left shoulder: Secondary | ICD-10-CM | POA: Diagnosis not present

## 2020-03-24 DIAGNOSIS — M6281 Muscle weakness (generalized): Secondary | ICD-10-CM

## 2020-03-27 ENCOUNTER — Encounter: Payer: Self-pay | Admitting: Physical Therapy

## 2020-03-27 NOTE — Therapy (Signed)
[facility] [address] [place], [place], [address] Phone: [phone]   Fax:  [phone]  Physical Therapy Treatment  Patient Details  Name: Nancy [name] MRN: [id number] Date of Birth: [birth year] Referring Provider (PT): Eunice Blase   Encounter Date: 03/24/2020   PT End of Session - 03/27/20 2110    Visit Number 11    Number of Visits 20    Date for PT Re-Evaluation 04/27/20    Authorization Type [employer]    PT Start Time 1215    PT Stop Time 1253    PT Time Calculation (min) 38 min    Activity Tolerance Patient tolerated treatment well    Behavior During Therapy [facility] for tasks assessed/performed           Past Medical History:  Diagnosis Date  . Anemia   . Asthma   . Bradycardia   . Chicken pox   . Dysrhythmia    occ. palpitations  . Fibromyalgia   . Headache(784.0)   . Migraines   . Neck pain    bulging disk  . Polyarthralgia 02/21/2019  . PONV (postoperative nausea and vomiting)    sometimes wakes up after surgery and cannot breath due to asthma, can be bradycardic  . Rheumatoid arthritis involving multiple sites with positive rheumatoid factor ([place]) 02/21/2019  . Shortness of breath    with bradycardia    Past Surgical History:  Procedure Laterality Date  . ABDOMINAL HYSTERECTOMY    . BACK SURGERY     cervical fusion C5,6,7 with a bulge at C4  . BLADDER SURGERY  July 10th 2013  . [place]  . CYSTOSCOPY N/A 07/31/2018   Procedure: CYSTOSCOPY;  Surgeon: Bjorn Loser, MD;  Location: [facility];  Service: Urology;  Laterality: N/A;  . DIAGNOSTIC LAPAROSCOPY    . EXCISION OF MESH N/A 07/31/2018   Procedure: REMOVAL OF VAGINAL MESH;  Surgeon: Bjorn Loser, MD;  [facility];  Service: Urology;  Laterality: N/A;  . LAPAROSCOPY Bilateral 05/31/2013   Procedure: LAPAROSCOPY withbilateral salpingo-oophorectomy, lysis of adhesions;  Surgeon: Allena Katz, MD;   Location: [facility];  Service: Gynecology;  Laterality: Bilateral;  with clippings of Vaginal  mesh  . SALPINGOOPHORECTOMY Bilateral 05/31/2013   Procedure: BILATERAL SALPINGO OOPHORECTOMY/TRIM MESH;  Surgeon: Allena Katz, MD;  Location: [facility];  Service: Gynecology;  Laterality: Bilateral;  . TUBAL LIGATION     1995  . VAGINAL HYSTERECTOMY     1998 or 1997  . WRIST FRACTURE SURGERY  2005    There were no vitals filed for this visit.   Subjective Assessment - 03/27/20 2109    Subjective Pt has MRI scheduled for september. Does feel that she is doing better, but continues to have pain in anterior shoulder and difficulty with elevation.    Patient Stated Goals decreased pain    Currently in Pain? Yes    Pain Score 3     Pain Location Shoulder    Pain Orientation Left    Pain Descriptors / Indicators Aching    Pain Type Acute pain    Pain Onset More than a month ago    Pain Frequency Intermittent                             [facility] Adult PT Treatment/Exercise - 03/27/20 0001      Shoulder Exercises: Supine  External Rotation Both;15 reps    External Rotation Weight (lbs) 2    External Rotation Limitations at 90 deg    Other Supine Exercises Horizontal abd x 15;      Other Supine Exercises Pec stretch 2 min;       Shoulder Exercises: Sidelying   Other Sidelying Exercises Horiz abd x15      Shoulder Exercises: Standing   External Rotation 15 reps    Theraband Level (Shoulder External Rotation) Level 2 (Red)    Internal Rotation 15 reps    Theraband Level (Shoulder Internal Rotation) Level 3 (Green)    Flexion AROM;10 reps    ABduction AROM;10 reps    Row 20 reps    Theraband Level (Shoulder Row) Level 3 (Green)    Other Standing Exercises wall angel x 10;       Shoulder Exercises: ROM/Strengthening   UBE (Upper Arm Bike) L1 x 3 min;       Shoulder Exercises: Stretch   Corner Stretch 2 reps;30 seconds    Corner Stretch Limitations doorway       Modalities   Modalities Iontophoresis      Cryotherapy   Cryotherapy Location Shoulder      Iontophoresis   Type of Iontophoresis Dexamethasone    Location --    Time --      Manual Therapy   Joint Mobilization inf and post mobs gr 3 ; L     Soft tissue mobilization --    Passive ROM L shoulder, all motions to tolerance                     PT Short Term Goals - 03/18/20 1411      PT SHORT TERM GOAL #1   Title Pt to be independent with initial HEP    Time 2    Period Weeks    Status Achieved    Target Date 02/13/20             PT Long Term Goals - 03/18/20 1411      PT LONG TERM GOAL #1   Title Pt to be independent with final HEP    Time 6    Period Weeks    Status Partially Met    Target Date 04/27/20      PT LONG TERM GOAL #2   Title Pt to report pain in L shoulder to 0-2/10 with activity    Time 6    Period Weeks    Status Partially Met    Target Date 04/27/20      PT LONG TERM GOAL #3   Title Pt to demo improved AROM for L UE to be WNL to improve ability for IADLS and work duties.    Time 6    Period Weeks    Status Partially Met    Target Date 04/27/20      PT LONG TERM GOAL #4   Title Pt to demo improved strength of L shoulder to at least 4+/5 for all motions, to improve ability for reaching, lifting, carrying and IADLS.    Time 6    Period Weeks    Status Partially Met    Target Date 04/27/20                 Plan - 03/27/20 2112    Clinical Impression Statement Pt with slow improvements of strength. Still has pain with AROM and weaknes with repeated motions for elevation, as well as  pain with end range ER.    PT Frequency 2x / week    PT Duration 6 weeks    PT Treatment/Interventions ADLs/Self Care Home Management;Cryotherapy;Electrical Stimulation;DME Instruction;Ultrasound;Moist Heat;Iontophoresis 29m/ml Dexamethasone;Functional mobility training;Therapeutic activities;Therapeutic exercise;Neuromuscular re-education;Manual  techniques;Patient/family education;Passive range of motion;Dry needling;Energy conservation;Joint Manipulations;Spinal Manipulations;Taping;Balance training    PT Next Visit Plan continue ROM and strengthening in pain free ROM; possible DN    Consulted and Agree with Plan of Care Patient           Patient will benefit from skilled therapeutic intervention in order to improve the following deficits and impairments:  Decreased range of motion, Increased muscle spasms, Decreased activity tolerance, Pain, Impaired flexibility, Decreased strength, Decreased mobility, Impaired UE functional use, Decreased knowledge of precautions, Hypomobility  Visit Diagnosis: Acute pain of left shoulder  Muscle weakness (generalized)     Problem List Patient Active Problem List   Diagnosis Date Noted  . Immunosuppressed status ([place] 12/26/2019  . Hyperlipidemia 12/25/2019  . Rheumatoid arthritis involving multiple sites with positive rheumatoid factor ([place] 02/21/2019  . Vitamin B12 deficiency 02/14/2019  . Urge incontinence 12/20/2018  . Urinary frequency 12/20/2018  . Fibromyalgia 04/12/2018  . Vitamin D deficiency 04/12/2018  . Vaginal dryness 04/12/2018  . History of total hysterectomy 04/12/2018  . Insomnia 04/12/2018  . Morbid obesity ([place] 04/12/2018  . History of colon polyps 04/12/2018  . Primary osteoarthritis of both knees 12/28/2017  . Cervicogenic headache 03/26/2014  . Cervical radiculopathy 03/26/2014  . Mild intermittent asthma 11/20/2013  . Bradycardia 07/04/2012    LLyndee Hensen PT, DPT 9:16 PM  03/27/20    [facility] [address]Phone: [phone]  Fax:  [phone] Name: Nancy HeyMRN: [id number] of Birth: 114-Oct-1967

## 2020-03-30 ENCOUNTER — Ambulatory Visit (INDEPENDENT_AMBULATORY_CARE_PROVIDER_SITE_OTHER): Payer: 59 | Admitting: Physical Therapy

## 2020-03-30 ENCOUNTER — Encounter: Payer: Self-pay | Admitting: Physical Therapy

## 2020-03-30 ENCOUNTER — Other Ambulatory Visit: Payer: Self-pay

## 2020-03-30 DIAGNOSIS — M25512 Pain in left shoulder: Secondary | ICD-10-CM | POA: Diagnosis not present

## 2020-03-30 DIAGNOSIS — M6281 Muscle weakness (generalized): Secondary | ICD-10-CM

## 2020-03-31 NOTE — Therapy (Signed)
[facility] [address] [place], [place], [address] Phone: [phone]   Fax:  [phone]  Physical Therapy Treatment  Patient Details  Name: Nancy [name] MRN: [id number] Date of Birth: [birth year] Referring Provider (PT): Eunice Blase   Encounter Date: 03/30/2020   PT End of Session - 03/31/20 0835    Visit Number 12    Number of Visits 20    Date for PT Re-Evaluation 04/27/20    Authorization Type [employer]    PT Start Time 1215    PT Stop Time 1303    PT Time Calculation (min) 48 min    Activity Tolerance Patient tolerated treatment well    Behavior During Therapy [facility] for tasks assessed/performed           Past Medical History:  Diagnosis Date  . Anemia   . Asthma   . Bradycardia   . Chicken pox   . Dysrhythmia    occ. palpitations  . Fibromyalgia   . Headache(784.0)   . Migraines   . Neck pain    bulging disk  . Polyarthralgia 02/21/2019  . PONV (postoperative nausea and vomiting)    sometimes wakes up after surgery and cannot breath due to asthma, can be bradycardic  . Rheumatoid arthritis involving multiple sites with positive rheumatoid factor ([place]) 02/21/2019  . Shortness of breath    with bradycardia    Past Surgical History:  Procedure Laterality Date  . ABDOMINAL HYSTERECTOMY    . BACK SURGERY     cervical fusion C5,6,7 with a bulge at C4  . BLADDER SURGERY  July 10th 2013  . [place]  . CYSTOSCOPY N/A 07/31/2018   Procedure: CYSTOSCOPY;  Surgeon: Bjorn Loser, MD;  [facility];  Service: Urology;  Laterality: N/A;  . DIAGNOSTIC LAPAROSCOPY    . EXCISION OF MESH N/A 07/31/2018   Procedure: REMOVAL OF VAGINAL MESH;  Surgeon: Bjorn Loser, MD;  [facility];  Service: Urology;  Laterality: N/A;  . LAPAROSCOPY Bilateral 05/31/2013   Procedure: LAPAROSCOPY withbilateral salpingo-oophorectomy, lysis of adhesions;  Surgeon: Allena Katz, MD;   Location: [facility];  Service: Gynecology;  Laterality: Bilateral;  with clippings of Vaginal  mesh  . SALPINGOOPHORECTOMY Bilateral 05/31/2013   Procedure: BILATERAL SALPINGO OOPHORECTOMY/TRIM MESH;  Surgeon: Allena Katz, MD;  Location: [facility];  Service: Gynecology;  Laterality: Bilateral;  . TUBAL LIGATION     1995  . VAGINAL HYSTERECTOMY     1998 or 1997  . WRIST FRACTURE SURGERY  2005    There were no vitals filed for this visit.   Subjective Assessment - 03/30/20 1223    Subjective Pt states increased pain in anterior shoulder over the weekend.    Patient Stated Goals decreased pain    Currently in Pain? Yes    Pain Score 7     Pain Location Shoulder    Pain Orientation Left    Pain Descriptors / Indicators Aching    Pain Type Acute pain    Pain Onset More than a month ago    Pain Frequency Intermittent                             [facility] Adult PT Treatment/Exercise - 03/30/20 1224      Shoulder Exercises: Supine   External Rotation Both;15 reps;AROM    External Rotation Weight (lbs) --  External Rotation Limitations at 90 deg    Flexion AROM;20 reps    Other Supine Exercises --    Other Supine Exercises --      Shoulder Exercises: Sidelying   Other Sidelying Exercises --      Shoulder Exercises: Standing   External Rotation 15 reps    Theraband Level (Shoulder External Rotation) Level 2 (Red)    Internal Rotation 15 reps    Theraband Level (Shoulder Internal Rotation) Level 3 (Green)    Flexion AROM;10 reps    ABduction --    Row 20 reps    Theraband Level (Shoulder Row) Level 3 (Green)    Other Standing Exercises Wall slides x 15      Shoulder Exercises: Pulleys   Flexion 3 minutes      Shoulder Exercises: ROM/Strengthening   UBE (Upper Arm Bike) --      Shoulder Exercises: Stretch   Corner Stretch 2 reps;30 seconds    Corner Stretch Limitations doorway      Modalities   Modalities Iontophoresis      Cryotherapy   Number  Minutes Cryotherapy 12 Minutes    Cryotherapy Location Shoulder    Type of Cryotherapy Ice pack      Electrical Stimulation   Electrical Stimulation Location L Shoulder    Electrical Stimulation Parameters Pre-Mod x 12 min    Electrical Stimulation Goals Pain      Iontophoresis   Type of Iontophoresis --      Manual Therapy   Joint Mobilization inf and post mobs gr 3 ; L     Soft tissue mobilization STM to anteiror shoulder and pec    Passive ROM L shoulder, all motions to tolerance                     PT Short Term Goals - 03/18/20 1411      PT SHORT TERM GOAL #1   Title Pt to be independent with initial HEP    Time 2    Period Weeks    Status Achieved    Target Date 02/13/20             PT Long Term Goals - 03/18/20 1411      PT LONG TERM GOAL #1   Title Pt to be independent with final HEP    Time 6    Period Weeks    Status Partially Met    Target Date 04/27/20      PT LONG TERM GOAL #2   Title Pt to report pain in L shoulder to 0-2/10 with activity    Time 6    Period Weeks    Status Partially Met    Target Date 04/27/20      PT LONG TERM GOAL #3   Title Pt to demo improved AROM for L UE to be WNL to improve ability for IADLS and work duties.    Time 6    Period Weeks    Status Partially Met    Target Date 04/27/20      PT LONG TERM GOAL #4   Title Pt to demo improved strength of L shoulder to at least 4+/5 for all motions, to improve ability for reaching, lifting, carrying and IADLS.    Time 6    Period Weeks    Status Partially Met    Target Date 04/27/20                 Plan -  03/31/20 0837    Clinical Impression Statement Pt with increased pain today with most motions and activities. Most pain concentrated at anterior shoulder and pec, sore with STM today. Increased difficutly with strengthening and ROM due to pain.    PT Frequency 2x / week    PT Duration 6 weeks    PT Treatment/Interventions ADLs/Self Care Home  Management;Cryotherapy;Electrical Stimulation;DME Instruction;Ultrasound;Moist Heat;Iontophoresis 69m/ml Dexamethasone;Functional mobility training;Therapeutic activities;Therapeutic exercise;Neuromuscular re-education;Manual techniques;Patient/family education;Passive range of motion;Dry needling;Energy conservation;Joint Manipulations;Spinal Manipulations;Taping;Balance training    PT Next Visit Plan continue ROM and strengthening in pain free ROM; possible DN    Consulted and Agree with Plan of Care Patient           Patient will benefit from skilled therapeutic intervention in order to improve the following deficits and impairments:  Decreased range of motion, Increased muscle spasms, Decreased activity tolerance, Pain, Impaired flexibility, Decreased strength, Decreased mobility, Impaired UE functional use, Decreased knowledge of precautions, Hypomobility  Visit Diagnosis: Acute pain of left shoulder  Muscle weakness (generalized)     Problem List Patient Active Problem List   Diagnosis Date Noted  . Immunosuppressed status ([place] 12/26/2019  . Hyperlipidemia 12/25/2019  . Rheumatoid arthritis involving multiple sites with positive rheumatoid factor ([place] 02/21/2019  . Vitamin B12 deficiency 02/14/2019  . Urge incontinence 12/20/2018  . Urinary frequency 12/20/2018  . Fibromyalgia 04/12/2018  . Vitamin D deficiency 04/12/2018  . Vaginal dryness 04/12/2018  . History of total hysterectomy 04/12/2018  . Insomnia 04/12/2018  . Morbid obesity ([place] 04/12/2018  . History of colon polyps 04/12/2018  . Primary osteoarthritis of both knees 12/28/2017  . Cervicogenic headache 03/26/2014  . Cervical radiculopathy 03/26/2014  . Mild intermittent asthma 11/20/2013  . Bradycardia 07/04/2012   LLyndee Hensen PT, DPT 8:41 AM  03/31/20    [facility] [place] [address]Phone: [phone]  Fax:   [phone] Name: DAnvika GashiMRN: [id number] of Birth: 108-May-1967

## 2020-04-02 ENCOUNTER — Encounter: Payer: Self-pay | Admitting: Physical Therapy

## 2020-04-02 ENCOUNTER — Ambulatory Visit: Payer: 59 | Admitting: Physical Therapy

## 2020-04-02 ENCOUNTER — Other Ambulatory Visit: Payer: Self-pay

## 2020-04-02 ENCOUNTER — Ambulatory Visit
Admission: RE | Admit: 2020-04-02 | Discharge: 2020-04-02 | Disposition: A | Discharge status: Home / Self Care | Payer: 59 | Source: Ambulatory Visit | Attending: Neurology | Admitting: Neurology

## 2020-04-02 DIAGNOSIS — R29898 Other symptoms and signs involving the musculoskeletal system: Secondary | ICD-10-CM

## 2020-04-02 DIAGNOSIS — M25512 Pain in left shoulder: Secondary | ICD-10-CM

## 2020-04-02 DIAGNOSIS — R2689 Other abnormalities of gait and mobility: Secondary | ICD-10-CM

## 2020-04-02 DIAGNOSIS — M5126 Other intervertebral disc displacement, lumbar region: Secondary | ICD-10-CM | POA: Diagnosis not present

## 2020-04-02 DIAGNOSIS — G8929 Other chronic pain: Secondary | ICD-10-CM

## 2020-04-02 DIAGNOSIS — M47816 Spondylosis without myelopathy or radiculopathy, lumbar region: Secondary | ICD-10-CM | POA: Diagnosis not present

## 2020-04-02 DIAGNOSIS — M6281 Muscle weakness (generalized): Secondary | ICD-10-CM

## 2020-04-02 NOTE — Therapy (Addendum)
[facility] [address] [place], [place], [address] Phone: [phone]   Fax:  [phone]  Physical Therapy Treatment  Patient Details  Name: Nancy [name] MRN: [id number] Date of Birth: 08/03/1965 Referring Provider (PT): Eunice Blase   Encounter Date: 04/02/2020   PT End of Session - 04/02/20 1434     Visit Number 13    Number of Visits 20    Date for PT Re-Evaluation 04/27/20    Authorization Type [employer]    PT Start Time 1430    PT Stop Time 1515    PT Time Calculation (min) 45 min    Activity Tolerance Patient tolerated treatment well    Behavior During Therapy [facility] for tasks assessed/performed             Past Medical History:  Diagnosis Date   Anemia    Asthma    Bradycardia    Chicken pox    Dysrhythmia    occ. palpitations   Fibromyalgia    Headache(784.0)    Migraines    Neck pain    bulging disk   Polyarthralgia 02/21/2019   PONV (postoperative nausea and vomiting)    sometimes wakes up after surgery and cannot breath due to asthma, can be bradycardic   Rheumatoid arthritis involving multiple sites with positive rheumatoid factor ([place]) 02/21/2019   Shortness of breath    with bradycardia    Past Surgical History:  Procedure Laterality Date   ABDOMINAL HYSTERECTOMY     BACK SURGERY     cervical fusion C5,6,7 with a bulge at [place]  July 10th 2013   [place] N/A 07/31/2018   Procedure: CYSTOSCOPY;  Surgeon: Bjorn Loser, MD;  [facility];  Service: Urology;  Laterality: N/A;   DIAGNOSTIC LAPAROSCOPY     EXCISION OF MESH N/A 07/31/2018   Procedure: REMOVAL OF VAGINAL MESH;  Surgeon: Bjorn Loser, MD;  [facility];  Service: Urology;  Laterality: N/A;   LAPAROSCOPY Bilateral 05/31/2013   Procedure: LAPAROSCOPY withbilateral salpingo-oophorectomy, lysis of adhesions;  Surgeon: Allena Katz, MD;  Location: [facility];   Service: Gynecology;  Laterality: Bilateral;  with clippings of Vaginal  mesh   SALPINGOOPHORECTOMY Bilateral 05/31/2013   Procedure: BILATERAL SALPINGO OOPHORECTOMY/TRIM MESH;  Surgeon: Allena Katz, MD;  Location: [facility];  Service: Gynecology;  Laterality: Bilateral;   [place] or [place]  2005    There were no vitals filed for this visit.   Subjective Assessment - 04/02/20 1433     Subjective Pt states improved pain in last couple days, but still sore with elevation.    Patient Stated Goals decreased pain    Currently in Pain? Yes    Pain Score 4     Pain Location Shoulder    Pain Orientation Left    Pain Descriptors / Indicators Aching    Pain Type Acute pain    Pain Onset More than a month ago    Pain Frequency Intermittent                               [facility] Adult PT Treatment/Exercise - 04/02/20 0001       Shoulder Exercises: Prone   Other Prone Exercises  Prone T x 10 (pain  )       Shoulder Exercises: Standing   External Rotation 15 reps    Theraband Level (Shoulder External Rotation) Level 2 (Red)    Internal Rotation 15 reps    Theraband Level (Shoulder Internal Rotation) Level 3 (Green)    Row 20 reps    Theraband Level (Shoulder Row) Level 3 (Green)    Other Standing Exercises Wall slides x 15      Shoulder Exercises: Pulleys   Flexion 3 minutes      Cryotherapy   Number Minutes Cryotherapy 12 Minutes    Cryotherapy Location Shoulder    Type of Cryotherapy Ice pack      Electrical Stimulation   Electrical Stimulation Location L Shoulder    Electrical Stimulation Parameters PreMod x 12 min     Electrical Stimulation Goals Pain      Manual Therapy   Joint Mobilization inf and post mobs , gr 3    Passive ROM L shoulder, all motions to tolerance , Supine sleeper stretch  for IR , Manually resisted IR/ER                       PT Short Term  Goals - 03/18/20 1411       PT SHORT TERM GOAL #1   Title Pt to be independent with initial HEP    Time 2    Period Weeks    Status Achieved    Target Date 02/13/20               PT Long Term Goals - 03/18/20 1411       PT LONG TERM GOAL #1   Title Pt to be independent with final HEP    Time 6    Period Weeks    Status Partially Met    Target Date 04/27/20      PT LONG TERM GOAL #2   Title Pt to report pain in L shoulder to 0-2/10 with activity    Time 6    Period Weeks    Status Partially Met    Target Date 04/27/20      PT LONG TERM GOAL #3   Title Pt to demo improved AROM for L UE to be WNL to improve ability for IADLS and work duties.    Time 6    Period Weeks    Status Partially Met    Target Date 04/27/20      PT LONG TERM GOAL #4   Title Pt to demo improved strength of L shoulder to at least 4+/5 for all motions, to improve ability for reaching, lifting, carrying and IADLS.    Time 6    Period Weeks    Status Partially Met    Target Date 04/27/20                   Plan - 04/02/20 2136     Clinical Impression Statement Pt with continued soreness in anterior shoulder. Also with increased weakness and difficulty with ER strengthening. Limited ability for repeated motions and strengthening for elevation due to pain . Pt having MRI next week. Will make plan for continuing vs refer back to MD after MRI results.    PT Frequency 2x / week    PT Duration 6 weeks    PT Treatment/Interventions ADLs/Self Care Home Management;Cryotherapy;Electrical Stimulation;DME Instruction;Ultrasound;Moist Heat;Iontophoresis 4mg /ml Dexamethasone;Functional mobility training;Therapeutic activities;Therapeutic exercise;Neuromuscular re-education;Manual techniques;Patient/family education;Passive range of motion;Dry needling;Energy  conservation;Joint Manipulations;Spinal Manipulations;Taping;Balance training    PT Next Visit Plan continue ROM and strengthening in pain free  ROM; possible DN    Consulted and Agree with Plan of Care Patient             Patient will benefit from skilled therapeutic intervention in order to improve the following deficits and impairments:  Decreased range of motion, Increased muscle spasms, Decreased activity tolerance, Pain, Impaired flexibility, Decreased strength, Decreased mobility, Impaired UE functional use, Decreased knowledge of precautions, Hypomobility  Visit Diagnosis: Acute pain of left shoulder  Muscle weakness (generalized)     Problem List Patient Active Problem List   Diagnosis Date Noted   Immunosuppressed status ([place]) 12/26/2019   Hyperlipidemia 12/25/2019   Rheumatoid arthritis involving multiple sites with positive rheumatoid factor ([place]) 02/21/2019   Vitamin B12 deficiency 02/14/2019   Urge incontinence 12/20/2018   Urinary frequency 12/20/2018   Fibromyalgia 04/12/2018   Vitamin D deficiency 04/12/2018   Vaginal dryness 04/12/2018   History of total hysterectomy 04/12/2018   Insomnia 04/12/2018   Morbid obesity ([place]) 04/12/2018   History of colon polyps 04/12/2018   Primary osteoarthritis of both knees 12/28/2017   Cervicogenic headache 03/26/2014   Cervical radiculopathy 03/26/2014   Mild intermittent asthma 11/20/2013   Bradycardia 07/04/2012    Lyndee Hensen, PT, DPT 9:39 PM  04/02/20    [facility] [address] [place], [place], [address] Phone: [phone]   Fax:  [phone]  Name: Nancy [name] MRN: [id number] Date of Birth: 17-Feb-1966    PHYSICAL THERAPY DISCHARGE SUMMARY  Visits from Start of Care: 13 Plan: Patient agrees to discharge.  Patient goals were partially met. Patient is being discharged due to -  - not returning since last visit.       Lyndee Hensen, PT, DPT 12:50 PM  11/01/21

## 2020-04-03 ENCOUNTER — Other Ambulatory Visit: Payer: Self-pay

## 2020-04-03 DIAGNOSIS — R29898 Other symptoms and signs involving the musculoskeletal system: Secondary | ICD-10-CM

## 2020-04-08 ENCOUNTER — Other Ambulatory Visit: Payer: Self-pay | Admitting: Internal Medicine

## 2020-04-08 ENCOUNTER — Other Ambulatory Visit (HOSPITAL_COMMUNITY): Payer: Self-pay | Admitting: Rheumatology

## 2020-04-08 MED FILL — FOLIC ACID 1 MG TABS: 1 | 90 days supply | Qty: 90 | Fill #0

## 2020-04-08 NOTE — Telephone Encounter (Signed)
Requested medication (s) are due for refill today -yes  Requested medication (s) are on the active medication list -yes  Future visit scheduled -no  Last refill: 03/19/20  Notes to clinic: Request for RF of medication not assigned to protocol  Requested Prescriptions  Pending Prescriptions Disp Refills   RINVOQ 15 MG TB24 [Pharmacy Med Name: RINVOQ 15 MG TB24 15 Tablet] 30 tablet 0    Sig: TAKE 1 TABLET BY MOUTH ONCE A DAY      Off-Protocol Failed - 04/08/2020 12:04 PM      Failed - Medication not assigned to a protocol, review manually.      Passed - Valid encounter within last 12 months    Recent Outpatient Visits           5 months ago Encounter for medication review and counseling   [place], Jarome Matin, RPH-CPP   8 months ago Encounter for medication review and counseling   [place], RPH-CPP                  Requested Prescriptions  Pending Prescriptions Disp Refills   RINVOQ 15 MG TB24 [Pharmacy Med Name: RINVOQ 15 MG TB24 15 Tablet] 30 tablet 0    Sig: TAKE 1 TABLET BY MOUTH ONCE A DAY      Off-Protocol Failed - 04/08/2020 12:04 PM      Failed - Medication not assigned to a protocol, review manually.      Passed - Valid encounter within last 12 months    Recent Outpatient Visits           5 months ago Encounter for medication review and counseling   [place], RPH-CPP   8 months ago Encounter for medication review and counseling   [place], RPH-CPP

## 2020-04-11 ENCOUNTER — Ambulatory Visit
Admission: RE | Admit: 2020-04-11 | Discharge: 2020-04-11 | Disposition: A | Discharge status: Home / Self Care | Payer: 59 | Source: Ambulatory Visit | Attending: Family Medicine | Admitting: Family Medicine

## 2020-04-11 ENCOUNTER — Other Ambulatory Visit: Payer: 59

## 2020-04-11 DIAGNOSIS — S42252A Displaced fracture of greater tuberosity of left humerus, initial encounter for closed fracture: Secondary | ICD-10-CM | POA: Diagnosis not present

## 2020-04-11 DIAGNOSIS — M25512 Pain in left shoulder: Secondary | ICD-10-CM

## 2020-04-13 ENCOUNTER — Telehealth: Payer: Self-pay | Admitting: Family Medicine

## 2020-04-13 ENCOUNTER — Other Ambulatory Visit: Payer: Self-pay | Admitting: Pharmacist

## 2020-04-13 ENCOUNTER — Telehealth: Payer: Self-pay

## 2020-04-13 DIAGNOSIS — S42255D Nondisplaced fracture of greater tuberosity of left humerus, subsequent encounter for fracture with routine healing: Secondary | ICD-10-CM

## 2020-04-13 DIAGNOSIS — S46012S Strain of muscle(s) and tendon(s) of the rotator cuff of left shoulder, sequela: Secondary | ICD-10-CM

## 2020-04-13 MED ORDER — RINVOQ 15 MG PO TB24: 15.0000 mg | ORAL_TABLET | Freq: Every day | ORAL | 0 refills | Status: DC

## 2020-04-13 NOTE — Telephone Encounter (Signed)
Please fax this ASAP for this patient - order was put in today by Dr. Junius Roads.

## 2020-04-13 NOTE — Telephone Encounter (Signed)
Patient called she stated a referral was supposed to be sent to [facility] and they haven't received it, she needs it ASAP she wants to be seen call back:[phone].

## 2020-04-13 NOTE — Telephone Encounter (Signed)
MRI shows partial tear of the supraspinatus.  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

## 2020-04-14 NOTE — Telephone Encounter (Signed)
Referral faxed to [facility] to attn Dr. Tamera Punt.

## 2020-04-16 MED FILL — RINVOQ 15 MG TB24: 15 | 30 days supply | Qty: 30 | Fill #0

## 2020-04-17 DIAGNOSIS — M25512 Pain in left shoulder: Secondary | ICD-10-CM | POA: Diagnosis not present

## 2020-05-05 MED FILL — METHOTREXATE SODIUM 2.5 MG: 2.5 | 35 days supply | Qty: 40 | Fill #0

## 2020-05-06 ENCOUNTER — Ambulatory Visit: Payer: 59 | Admitting: Neurology

## 2020-05-06 ENCOUNTER — Other Ambulatory Visit: Payer: Self-pay | Admitting: Pharmacist

## 2020-05-06 ENCOUNTER — Other Ambulatory Visit: Payer: Self-pay

## 2020-05-06 DIAGNOSIS — R29898 Other symptoms and signs involving the musculoskeletal system: Secondary | ICD-10-CM

## 2020-05-06 MED ORDER — RINVOQ 15 MG PO TB24: 15.0000 mg | ORAL_TABLET | Freq: Every day | ORAL | 0 refills | Status: DC

## 2020-05-06 NOTE — Procedures (Signed)
[facility]  [place], [address]  [place], [place] [address] Tel: [phone] Fax:  [phone] Test Date:  05/06/2020  Patient: Nancy [name] DOB: [birth year] Physician: Narda Amber, DO  Sex: Female Height: 5\' 2"  Ref Phys: Metta Clines, D.O.  ID#: [id number]   Technician:    Patient Complaints: This is a 54 year old female referred for evaluation of right leg weakness.  NCV & EMG Findings: Extensive electrodiagnostic testing of the right lower extremity shows: 1. Right sural and superficial peroneal sensory responses are within normal limits. 2. Right peroneal and tibial motor responses are within normal limits. 3. Right tibial H reflex study is within normal limits. 4. There is no evidence of active or chronic motor axonal loss changes affecting any of the tested muscles.  Motor unit configuration and recruitment pattern is within normal limits.  Impression: Is a normal study of the right lower extremity.  In particular, there is no evidence of a sensorimotor polyneuropathy, diffuse myopathy, or lumbosacral radiculopathy   ___________________________ Narda Amber, DO    Nerve Conduction Studies Anti Sensory Summary Table   Stim Site NR Peak (ms) Norm Peak (ms) P-T Amp (V) Norm P-T Amp  Right Sup Peroneal Anti Sensory (Ant Lat Mall)  34C  12 cm    2.2 <4.6 11.9 >4  Right Sural Anti Sensory (Lat Mall)  34C  Calf    2.1 <4.6 11.0 >4   Motor Summary Table   Stim Site NR Onset (ms) Norm Onset (ms) O-P Amp (mV) Norm O-P Amp Site1 Site2 Delta-0 (ms) Dist (cm) Vel (m/s) Norm Vel (m/s)  Right Peroneal Motor (Ext Dig Brev)  34C  Ankle    3.0 <6.0 3.8 >2.5 B Fib Ankle 7.2 34.0 47 >40  B Fib    10.2  3.6  Poplt B Fib 1.3 8.0 62 >40  Poplt    11.5  3.5         Right Tibial Motor (Abd Hall Brev)  34C  Ankle    3.8 <6.0 7.8 >4 Knee Ankle 8.2 40.0 49 >40  Knee    12.0  4.3          H Reflex Studies   NR H-Lat (ms) Lat Norm (ms) L-R H-Lat (ms)  Right  Tibial (Gastroc)  34C     34.83 <35    EMG   Side Muscle Ins Act Fibs Psw Fasc Number Recrt Dur Dur. Amp Amp. Poly Poly. Comment  Right AntTibialis Nml Nml Nml Nml Nml Nml Nml Nml Nml Nml Nml Nml N/A  Right Gastroc Nml Nml Nml Nml Nml Nml Nml Nml Nml Nml Nml Nml N/A  Right Flex Dig Long Nml Nml Nml Nml Nml Nml Nml Nml Nml Nml Nml Nml N/A  Right RectFemoris Nml Nml Nml Nml Nml Nml Nml Nml Nml Nml Nml Nml N/A  Right GluteusMed Nml Nml Nml Nml Nml Nml Nml Nml Nml Nml Nml Nml N/A      Waveforms:

## 2020-05-11 ENCOUNTER — Ambulatory Visit (INDEPENDENT_AMBULATORY_CARE_PROVIDER_SITE_OTHER)
Admission: RE | Admit: 2020-05-11 | Discharge: 2020-05-11 | Disposition: A | Discharge status: Home / Self Care | Payer: 59 | Source: Ambulatory Visit | Attending: Physician Assistant | Admitting: Physician Assistant

## 2020-05-11 ENCOUNTER — Other Ambulatory Visit: Payer: Self-pay | Admitting: Physician Assistant

## 2020-05-11 ENCOUNTER — Other Ambulatory Visit: Payer: Self-pay

## 2020-05-11 DIAGNOSIS — M778 Other enthesopathies, not elsewhere classified: Secondary | ICD-10-CM | POA: Diagnosis not present

## 2020-05-11 DIAGNOSIS — M79672 Pain in left foot: Secondary | ICD-10-CM

## 2020-05-11 DIAGNOSIS — M7732 Calcaneal spur, left foot: Secondary | ICD-10-CM | POA: Diagnosis not present

## 2020-05-11 DIAGNOSIS — M19072 Primary osteoarthritis, left ankle and foot: Secondary | ICD-10-CM | POA: Diagnosis not present

## 2020-05-11 MED ORDER — PREDNISONE 20 MG PO TABS: 40.0000 mg | ORAL_TABLET | Freq: Every day | ORAL | 0 refills | Status: DC

## 2020-05-11 MED FILL — predniSONE 20 MG TABS: 20 | 5 days supply | Qty: 10 | Fill #0

## 2020-05-11 MED FILL — RINVOQ 15 MG TB24: 15 | 30 days supply | Qty: 30 | Fill #0

## 2020-05-20 DIAGNOSIS — M25512 Pain in left shoulder: Secondary | ICD-10-CM | POA: Diagnosis not present

## 2020-06-04 MED FILL — GABAPENTIN 300 MG CAPSULE: 300 | 90 days supply | Qty: 270 | Fill #1

## 2020-06-04 MED FILL — traMADol HCL 50 MG TABS: 50 | 23 days supply | Qty: 180 | Fill #1

## 2020-06-05 ENCOUNTER — Other Ambulatory Visit: Payer: Self-pay | Admitting: Pharmacist

## 2020-06-05 MED ORDER — RINVOQ 15 MG PO TB24: 15.0000 mg | ORAL_TABLET | Freq: Every day | ORAL | 0 refills | Status: DC

## 2020-06-08 ENCOUNTER — Other Ambulatory Visit (HOSPITAL_COMMUNITY): Payer: Self-pay | Admitting: Rheumatology

## 2020-06-08 MED FILL — METHOTREXATE SODIUM 2.5 MG: 2.5 | 35 days supply | Qty: 40 | Fill #0

## 2020-06-11 MED FILL — RINVOQ 15 MG TB24: 15 | 30 days supply | Qty: 30 | Fill #0

## 2020-06-15 ENCOUNTER — Other Ambulatory Visit: Payer: Self-pay | Admitting: Physician Assistant

## 2020-06-15 DIAGNOSIS — E785 Hyperlipidemia, unspecified: Secondary | ICD-10-CM

## 2020-06-15 DIAGNOSIS — Z111 Encounter for screening for respiratory tuberculosis: Secondary | ICD-10-CM

## 2020-06-15 DIAGNOSIS — M0579 Rheumatoid arthritis with rheumatoid factor of multiple sites without organ or systems involvement: Secondary | ICD-10-CM

## 2020-06-16 ENCOUNTER — Other Ambulatory Visit: Payer: 59

## 2020-06-16 DIAGNOSIS — M0579 Rheumatoid arthritis with rheumatoid factor of multiple sites without organ or systems involvement: Secondary | ICD-10-CM

## 2020-06-16 DIAGNOSIS — E785 Hyperlipidemia, unspecified: Secondary | ICD-10-CM | POA: Diagnosis not present

## 2020-06-16 DIAGNOSIS — Z111 Encounter for screening for respiratory tuberculosis: Secondary | ICD-10-CM

## 2020-06-18 ENCOUNTER — Ambulatory Visit: Payer: Self-pay

## 2020-06-18 ENCOUNTER — Ambulatory Visit: Payer: 59 | Admitting: Family Medicine

## 2020-06-18 ENCOUNTER — Other Ambulatory Visit: Payer: Self-pay

## 2020-06-18 DIAGNOSIS — G8929 Other chronic pain: Secondary | ICD-10-CM

## 2020-06-18 DIAGNOSIS — M0579 Rheumatoid arthritis with rheumatoid factor of multiple sites without organ or systems involvement: Secondary | ICD-10-CM | POA: Diagnosis not present

## 2020-06-18 DIAGNOSIS — M170 Bilateral primary osteoarthritis of knee: Secondary | ICD-10-CM

## 2020-06-18 DIAGNOSIS — R748 Abnormal levels of other serum enzymes: Secondary | ICD-10-CM | POA: Diagnosis not present

## 2020-06-18 DIAGNOSIS — Z79899 Other long term (current) drug therapy: Secondary | ICD-10-CM | POA: Diagnosis not present

## 2020-06-18 DIAGNOSIS — M25562 Pain in left knee: Secondary | ICD-10-CM

## 2020-06-18 DIAGNOSIS — M25561 Pain in right knee: Secondary | ICD-10-CM | POA: Diagnosis not present

## 2020-06-18 DIAGNOSIS — M797 Fibromyalgia: Secondary | ICD-10-CM | POA: Diagnosis not present

## 2020-06-18 DIAGNOSIS — M1990 Unspecified osteoarthritis, unspecified site: Secondary | ICD-10-CM | POA: Diagnosis not present

## 2020-06-18 LAB — CBC WITH DIFFERENTIAL/PLATELET
Absolute Monocytes: 611 cells/uL (ref 200–950)
Basophils Absolute: 20 cells/uL (ref 0–200)
Basophils Relative: 0.3 %
Eosinophils Absolute: 33 cells/uL (ref 15–500)
Eosinophils Relative: 0.5 %
HCT: 37.4 % (ref 35.0–45.0)
Hemoglobin: 12.3 g/dL (ref 11.7–15.5)
Lymphs Abs: 1404 cells/uL (ref 850–3900)
MCH: 29.6 pg (ref 27.0–33.0)
MCHC: 32.9 g/dL (ref 32.0–36.0)
MCV: 89.9 fL (ref 80.0–100.0)
MPV: 12 fL (ref 7.5–12.5)
Monocytes Relative: 9.4 %
Neutro Abs: 4433 cells/uL (ref 1500–7800)
Neutrophils Relative %: 68.2 %
Platelets: 260 10*3/uL (ref 140–400)
RBC: 4.16 10*6/uL (ref 3.80–5.10)
RDW: 13 % (ref 11.0–15.0)
Total Lymphocyte: 21.6 %
WBC: 6.5 10*3/uL (ref 3.8–10.8)

## 2020-06-18 LAB — LIPID PANEL
Cholesterol: 249 mg/dL — ABNORMAL HIGH (ref <200)
HDL: 87 mg/dL (ref >=50)
LDL Cholesterol (Calc): 135 mg/dL (calc) — ABNORMAL HIGH
Non-HDL Cholesterol (Calc): 162 mg/dL (calc) — ABNORMAL HIGH (ref <130)
Total CHOL/HDL Ratio: 2.9 (calc) (ref <5.0)
Triglycerides: 138 mg/dL (ref <150)

## 2020-06-18 LAB — QUANTIFERON-TB GOLD PLUS
Mitogen-NIL: 4.87 IU/mL
NIL: 0.04 IU/mL
QuantiFERON-TB Gold Plus: NEGATIVE
TB1-NIL: <0 IU/mL
TB2-NIL: 0 IU/mL

## 2020-06-18 LAB — COMPREHENSIVE METABOLIC PANEL
AG Ratio: 1.7 (calc) (ref 1.0–2.5)
ALT: 22 U/L (ref 6–29)
AST: 28 U/L (ref 10–35)
Albumin: 4.7 g/dL (ref 3.6–5.1)
Alkaline phosphatase (APISO): 73 U/L (ref 37–153)
BUN: 16 mg/dL (ref 7–25)
CO2: 31 mmol/L (ref 20–32)
Calcium: 10.1 mg/dL (ref 8.6–10.4)
Chloride: 94 mmol/L — ABNORMAL LOW (ref 98–110)
Creat: 0.88 mg/dL (ref 0.50–1.05)
Globulin: 2.7 g/dL (calc) (ref 1.9–3.7)
Glucose, Bld: 97 mg/dL (ref 65–99)
Potassium: 3.9 mmol/L (ref 3.5–5.3)
Sodium: 137 mmol/L (ref 135–146)
Total Bilirubin: 0.4 mg/dL (ref 0.2–1.2)
Total Protein: 7.4 g/dL (ref 6.1–8.1)

## 2020-06-18 LAB — SEDIMENTATION RATE: Sed Rate: 41 mm/h — ABNORMAL HIGH (ref 0–30)

## 2020-06-18 NOTE — Progress Notes (Signed)
Nancy [name] presents to clinic today for Orthovisc injection bilateral knees 1/4  Procedure: Real-time Ultrasound Guided Injection of right knee superior lateral patellar space Device: Philips Affiniti 50G Images permanently stored and available for review in PACS Verbal informed consent obtained.  Discussed risks and benefits of procedure. Warned about infection bleeding damage to structures skin hypopigmentation and fat atrophy among others. Patient expresses understanding and agreement Time-out conducted.   Noted no overlying erythema, induration, or other signs of local infection.   Skin prepped in a sterile fashion.   Local anesthesia: Topical Ethyl chloride.   With sterile technique and under real time ultrasound guidance:  Orthovisc injected into joint. Fluid seen entering the joint capsule.   Completed without difficulty    Advised to call if fevers/chills, erythema, induration, drainage, or persistent bleeding.   Images permanently stored and available for review in the ultrasound unit.  Impression: Technically successful ultrasound guided injection.   Procedure: Real-time Ultrasound Guided Injection of left knee superior lateral patellar space Device: Philips Affiniti 50G Images permanently stored and available for review in PACS Verbal informed consent obtained.  Discussed risks and benefits of procedure. Warned about infection bleeding damage to structures skin hypopigmentation and fat atrophy among others. Patient expresses understanding and agreement Time-out conducted.   Noted no overlying erythema, induration, or other signs of local infection.   Skin prepped in a sterile fashion.   Local anesthesia: Topical Ethyl chloride.   With sterile technique and under real time ultrasound guidance:  Orthovisc injected into joint. Fluid seen entering the joint capsule.   Completed without difficulty   Advised to call if fevers/chills, erythema, induration, drainage, or persistent  bleeding.   Images permanently stored and available for review in the ultrasound unit.  Impression: Technically successful ultrasound guided injection.   Lot #[id number] bilateral  Return in 1 week for Orthovisc injection 2/4

## 2020-06-19 ENCOUNTER — Other Ambulatory Visit: Payer: Self-pay | Admitting: Physician Assistant

## 2020-06-19 MED ORDER — PREDNISONE 10 MG PO TABS: ORAL_TABLET | ORAL | 0 refills | Status: DC

## 2020-06-19 MED FILL — predniSONE 10 MG TABS: 10 | 10 days supply | Qty: 11 | Fill #0

## 2020-06-24 ENCOUNTER — Ambulatory Visit: Payer: 59 | Admitting: Family Medicine

## 2020-06-24 ENCOUNTER — Ambulatory Visit: Payer: Self-pay

## 2020-06-24 ENCOUNTER — Other Ambulatory Visit: Payer: Self-pay

## 2020-06-24 DIAGNOSIS — G8929 Other chronic pain: Secondary | ICD-10-CM

## 2020-06-24 DIAGNOSIS — M25562 Pain in left knee: Secondary | ICD-10-CM | POA: Diagnosis not present

## 2020-06-24 DIAGNOSIS — M170 Bilateral primary osteoarthritis of knee: Secondary | ICD-10-CM | POA: Diagnosis not present

## 2020-06-24 DIAGNOSIS — M25561 Pain in right knee: Secondary | ICD-10-CM | POA: Diagnosis not present

## 2020-06-24 NOTE — Progress Notes (Signed)
Nancy [name] presents to clinic today for Orthovisc injection bilateral knees 2/4  Procedure: Real-time Ultrasound Guided Injection of right knee superior patellar space lateral Device: Philips Affiniti 50G Images permanently stored and available for review in PACS Verbal informed consent obtained.  Discussed risks and benefits of procedure. Warned about infection bleeding damage to structures skin hypopigmentation and fat atrophy among others. Patient expresses understanding and agreement Time-out conducted.   Noted no overlying erythema, induration, or other signs of local infection.   Skin prepped in a sterile fashion.   Local anesthesia: Topical Ethyl chloride.   With sterile technique and under real time ultrasound guidance:  Orthovisc injected into knee. Fluid seen entering the joint capsule.   Completed without difficulty    Advised to call if fevers/chills, erythema, induration, drainage, or persistent bleeding.   Images permanently stored and available for review in the ultrasound unit.  Impression: Technically successful ultrasound guided injection.     Procedure: Real-time Ultrasound Guided Injection of left knee superior lateral patellar space Device: Philips Affiniti 50G Images permanently stored and available for review in PACS Verbal informed consent obtained.  Discussed risks and benefits of procedure. Warned about infection bleeding damage to structures skin hypopigmentation and fat atrophy among others. Patient expresses understanding and agreement Time-out conducted.   Noted no overlying erythema, induration, or other signs of local infection.   Skin prepped in a sterile fashion.   Local anesthesia: Topical Ethyl chloride.   With sterile technique and under real time ultrasound guidance:  Orthovisc injected into knee. Fluid seen entering the joint capsule.   Completed without difficulty    Advised to call if fevers/chills, erythema, induration, drainage, or persistent  bleeding.   Images permanently stored and available for review in the ultrasound unit.  Impression: Technically successful ultrasound guided injection.     Orthovisc lot #[id number] both injections  Return 1 week for Orthovisc injection bilateral knees 3/4

## 2020-06-26 MED FILL — HYDROCHLOROTHIAZIDE 25 MG T: 25 | 90 days supply | Qty: 90 | Fill #1

## 2020-07-02 ENCOUNTER — Other Ambulatory Visit (HOSPITAL_COMMUNITY): Payer: Self-pay | Admitting: Rheumatology

## 2020-07-02 ENCOUNTER — Ambulatory Visit: Payer: Self-pay

## 2020-07-02 ENCOUNTER — Other Ambulatory Visit: Payer: Self-pay

## 2020-07-02 ENCOUNTER — Ambulatory Visit: Payer: 59 | Admitting: Family Medicine

## 2020-07-02 DIAGNOSIS — M25561 Pain in right knee: Secondary | ICD-10-CM

## 2020-07-02 DIAGNOSIS — M170 Bilateral primary osteoarthritis of knee: Secondary | ICD-10-CM | POA: Diagnosis not present

## 2020-07-02 DIAGNOSIS — M25562 Pain in left knee: Secondary | ICD-10-CM | POA: Diagnosis not present

## 2020-07-02 DIAGNOSIS — G8929 Other chronic pain: Secondary | ICD-10-CM | POA: Diagnosis not present

## 2020-07-02 NOTE — Progress Notes (Signed)
Nancy [name] presents to clinic today for Orthovisc injection bilateral knees 3/4  Procedure: Real-time Ultrasound Guided Injection of right knee superior lateral patellar space Device: Philips Affiniti 50G Images permanently stored and available for review in PACS Verbal informed consent obtained.  Discussed risks and benefits of procedure. Warned about infection bleeding damage to structures skin hypopigmentation and fat atrophy among others. Patient expresses understanding and agreement Time-out conducted.   Noted no overlying erythema, induration, or other signs of local infection.   Skin prepped in a sterile fashion.   Local anesthesia: Topical Ethyl chloride.   With sterile technique and under real time ultrasound guidance:  Orthovisc injected into joint. Fluid seen entering the joint capsule.   Completed without difficulty  .   Advised to call if fevers/chills, erythema, induration, drainage, or persistent bleeding.   Images permanently stored and available for review in the ultrasound unit.  Impression: Technically successful ultrasound guided injection.   Procedure: Real-time Ultrasound Guided Injection of left knee superior lateral patellar space Device: Philips Affiniti 50G Images permanently stored and available for review in PACS Verbal informed consent obtained.  Discussed risks and benefits of procedure. Warned about infection bleeding damage to structures skin hypopigmentation and fat atrophy among others. Patient expresses understanding and agreement Time-out conducted.   Noted no overlying erythema, induration, or other signs of local infection.   Skin prepped in a sterile fashion.   Local anesthesia: Topical Ethyl chloride.   With sterile technique and under real time ultrasound guidance:  Orthovisc injected into joint. Fluid seen entering the joint capsule.   Completed without difficulty    Advised to call if fevers/chills, erythema, induration, drainage, or persistent  bleeding.   Images permanently stored and available for review in the ultrasound unit.  Impression: Technically successful ultrasound guided injection.   Lot #[id number] both  Return 1 week for Orthovisc injection bilateral knees 4/4

## 2020-07-02 NOTE — Patient Instructions (Signed)
Thank you for coming in today.  Call or go to the ER if you develop a large red swollen joint with extreme pain or oozing puss.   Return in 1 week for last injection.

## 2020-07-06 MED FILL — FOLIC ACID 1 MG TABS: 1 | 90 days supply | Qty: 90 | Fill #0

## 2020-07-09 ENCOUNTER — Ambulatory Visit: Payer: Self-pay

## 2020-07-09 ENCOUNTER — Other Ambulatory Visit: Payer: Self-pay

## 2020-07-09 ENCOUNTER — Ambulatory Visit: Payer: 59 | Admitting: Family Medicine

## 2020-07-09 ENCOUNTER — Other Ambulatory Visit: Payer: Self-pay | Admitting: Family Medicine

## 2020-07-09 DIAGNOSIS — M25561 Pain in right knee: Secondary | ICD-10-CM | POA: Diagnosis not present

## 2020-07-09 DIAGNOSIS — M79672 Pain in left foot: Secondary | ICD-10-CM | POA: Diagnosis not present

## 2020-07-09 DIAGNOSIS — M25562 Pain in left knee: Secondary | ICD-10-CM

## 2020-07-09 DIAGNOSIS — G8929 Other chronic pain: Secondary | ICD-10-CM

## 2020-07-09 DIAGNOSIS — M170 Bilateral primary osteoarthritis of knee: Secondary | ICD-10-CM

## 2020-07-09 MED ORDER — NITROGLYCERIN 0.2 MG/HR TD PT24: MEDICATED_PATCH | TRANSDERMAL | 1 refills | Status: DC

## 2020-07-09 MED FILL — NITROGLYCERIN 0.2 MG/HR PTC: 0.2 | 88 days supply | Qty: 22 | Fill #0

## 2020-07-09 NOTE — Progress Notes (Signed)
Nancy [name] is a 54 y.o. female who presents to [place] at [facility] today for previously scheduled Orthovisc injection bilateral knees 4/4.  She also brings up left foot pain.  Left foot: Ongoing now since at least 2 months.  She was seen by her PCP for this in October and x-rays were obtained which showed no fractures.  She has pain in the lateral left foot worse with activity better with rest.  She has tried some relative rest.  Additionally she has had courses of steroids as prescribed by her rheumatologist which is helped some too.  Pertinent review of systems: No fevers or chills  Relevant historical information: Rheumatoid arthritis   Exam:  Heart rate 80 General: Well Developed, well nourished, and in no acute distress.   MSK: Knees bilaterally moderate in effusion no erythema  Left foot no erythema not particularly swollen.  Tender palpation proximal fifth metatarsal. Normal foot motion.     Lab and Radiology Results Procedure: Real-time Ultrasound Guided Injection of right knee superior lateral patellar space Device: Philips Affiniti 50G Images permanently stored and available for review in PACS Verbal informed consent obtained.  Discussed risks and benefits of procedure. Warned about infection bleeding damage to structures skin hypopigmentation and fat atrophy among others. Patient expresses understanding and agreement Time-out conducted.   Noted no overlying erythema, induration, or other signs of local infection.   Skin prepped in a sterile fashion.   Local anesthesia: Topical Ethyl chloride.   With sterile technique and under real time ultrasound guidance:  Orthovisc injected into knee. Fluid seen entering the joint capsule.   Completed without difficulty   Advised to call if fevers/chills, erythema, induration, drainage, or persistent bleeding.   Images permanently stored and available for review in the ultrasound unit.   Impression: Technically successful ultrasound guided injection.  Procedure: Real-time Ultrasound Guided Injection of left knee superior lateral patellar space Device: Philips Affiniti 50G Images permanently stored and available for review in PACS Verbal informed consent obtained.  Discussed risks and benefits of procedure. Warned about infection bleeding damage to structures skin hypopigmentation and fat atrophy among others. Patient expresses understanding and agreement Time-out conducted.   Noted no overlying erythema, induration, or other signs of local infection.   Skin prepped in a sterile fashion.   Local anesthesia: Topical Ethyl chloride.   With sterile technique and under real time ultrasound guidance:  Orthovisc injected into joint capsule. Fluid seen entering the knee.   Completed without difficulty   Advised to call if fevers/chills, erythema, induration, drainage, or persistent bleeding.   Images permanently stored and available for review in the ultrasound unit.  Impression: Technically successful ultrasound guided injection.  Lot number: [id number]   Diagnostic Limited MSK Ultrasound of: Left lateral foot Fifth metatarsal shaft normal-appearing Proximal to the metatarsal normal appearance of peroneal brevis tendon at insertion however need to insertion site change consistent with a ganglion cyst or rheumatoid nodule present.  This is area of tenderness with some area of increased vascularity on Doppler. Impression: Inflammation in the proximal fifth metatarsal consistent with ganglion cyst or rheumatoid nodule.    Assessment and Plan: 54 y.o. 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  Discussed options.  Would like to proceed with steroid injections right at the insertion site of a weightbearing tendon is a fearful because of tendon rupture.  Plan for trial of nitroglycerin patch protocol and  home  exercise program for peroneal tendinitis.  If not improving would consider trial of injection.  Knee injection proceeded according to plan fourth out of a series of 4 Orthovisc injections.Marland Kitchen   PDMP not reviewed this encounter. Orders Placed This Encounter  Procedures  . [place] LIMITED JOINT SPACE STRUCTURES LOW BILAT(NO LINKED CHARGES)    Standing Status:   Future    Number of Occurrences:   1    Standing Expiration Date:   01/07/2021    Order Specific Question:   Reason for Exam (SYMPTOM  OR DIAGNOSIS REQUIRED)    Answer:   chronic bilateral knee pain    Order Specific Question:   Preferred imaging location?    Answer:   [facility]   Meds ordered this encounter  Medications  . nitroGLYCERIN (NITRODUR - DOSED IN MG/24 HR) 0.2 mg/hr patch    Sig: Apply 1/4 patch daily to tendon for tendonitis.    Dispense:  30 patch    Refill:  1     Discussed warning signs or symptoms. Please see discharge instructions. Patient expresses understanding.   The above documentation has been reviewed and is accurate and complete Lynne Leader, M.D.

## 2020-07-09 NOTE — Patient Instructions (Addendum)
You had your final round of B knee Orthovisc injections today.  Call or go to the ER if you develop a large red swollen joint with extreme pain or oozing puss.   Nitroglycerin Protocol   Apply 1/4 nitroglycerin patch to affected area daily.  Change position of patch within the affected area every 24 hours.  You may experience a headache during the first 1-2 weeks of using the patch, these should subside.  If you experience headaches after beginning nitroglycerin patch treatment, you may take your preferred over the counter pain reliever.  Another side effect of the nitroglycerin patch is skin irritation or rash related to patch adhesive.  Please notify our office if you develop more severe headaches or rash, and stop the patch.  Tendon healing with nitroglycerin patch may require 12 to 24 weeks depending on the extent of injury.  Men should not use if taking Viagra, Cialis, or Levitra.   Do not use if you have migraines or rosacea.   Please perform the exercise program that we have prepared for you and gone over in detail on a daily basis.  In addition to the handout you were provided you can access your program through: [email or web address]   Your unique program code is:  [facility]  Follow-up as needed and Happy Holidays!

## 2020-07-10 MED FILL — RINVOQ 15 MG TB24: 15 | 30 days supply | Qty: 30 | Fill #0

## 2020-07-20 ENCOUNTER — Other Ambulatory Visit (HOSPITAL_COMMUNITY): Payer: Self-pay | Admitting: Rheumatology

## 2020-07-20 MED FILL — METHOTREXATE SODIUM 2.5 MG: 2.5 | 35 days supply | Qty: 40 | Fill #0

## 2020-07-21 ENCOUNTER — Other Ambulatory Visit (HOSPITAL_COMMUNITY): Payer: Self-pay | Admitting: Rheumatology

## 2020-07-28 ENCOUNTER — Other Ambulatory Visit: Payer: Self-pay | Admitting: Pharmacist

## 2020-07-28 MED ORDER — RINVOQ 15 MG PO TB24: 15.0000 mg | ORAL_TABLET | Freq: Every day | ORAL | 0 refills | Status: DC

## 2020-08-06 DIAGNOSIS — H5203 Hypermetropia, bilateral: Secondary | ICD-10-CM | POA: Diagnosis not present

## 2020-08-06 MED FILL — RINVOQ 15 MG TB24: 15 | 30 days supply | Qty: 30 | Fill #0

## 2020-08-27 ENCOUNTER — Ambulatory Visit: Payer: 59 | Admitting: Neurology

## 2020-08-28 MED FILL — METHOTREXATE SODIUM 2.5 MG: 2.5 | 35 days supply | Qty: 40 | Fill #0

## 2020-09-03 ENCOUNTER — Other Ambulatory Visit (INDEPENDENT_AMBULATORY_CARE_PROVIDER_SITE_OTHER): Payer: 59

## 2020-09-03 ENCOUNTER — Other Ambulatory Visit: Payer: Self-pay | Admitting: *Deleted

## 2020-09-03 ENCOUNTER — Other Ambulatory Visit: Payer: Self-pay | Admitting: Pharmacist

## 2020-09-03 DIAGNOSIS — M0579 Rheumatoid arthritis with rheumatoid factor of multiple sites without organ or systems involvement: Secondary | ICD-10-CM

## 2020-09-03 DIAGNOSIS — E785 Hyperlipidemia, unspecified: Secondary | ICD-10-CM

## 2020-09-03 LAB — LIPID PANEL
Cholesterol: 260 mg/dL — ABNORMAL HIGH (ref 0–200)
HDL: 85 mg/dL (ref >39.00)
LDL Cholesterol: 155 mg/dL — ABNORMAL HIGH (ref 0–99)
NonHDL: 174.85
Total CHOL/HDL Ratio: 3
Triglycerides: 98 mg/dL (ref 0.0–149.0)
VLDL: 19.6 mg/dL (ref 0.0–40.0)

## 2020-09-03 LAB — COMPREHENSIVE METABOLIC PANEL
ALT: 21 U/L (ref 0–35)
AST: 23 U/L (ref 0–37)
Albumin: 4.4 g/dL (ref 3.5–5.2)
Alkaline Phosphatase: 74 U/L (ref 39–117)
BUN: 17 mg/dL (ref 6–23)
CO2: 33 mEq/L — ABNORMAL HIGH (ref 19–32)
Calcium: 10 mg/dL (ref 8.4–10.5)
Chloride: 99 mEq/L (ref 96–112)
Creatinine, Ser: 0.79 mg/dL (ref 0.40–1.20)
GFR: 83.25 mL/min (ref >60.00)
Glucose, Bld: 90 mg/dL (ref 70–99)
Potassium: 3.9 mEq/L (ref 3.5–5.1)
Sodium: 137 mEq/L (ref 135–145)
Total Bilirubin: 0.5 mg/dL (ref 0.2–1.2)
Total Protein: 8 g/dL (ref 6.0–8.3)

## 2020-09-03 LAB — CBC WITH DIFFERENTIAL/PLATELET
Basophils Absolute: 0 10*3/uL (ref 0.0–0.1)
Basophils Relative: 0.3 % (ref 0.0–3.0)
Eosinophils Absolute: 0 10*3/uL (ref 0.0–0.7)
Eosinophils Relative: 0.7 % (ref 0.0–5.0)
HCT: 37.7 % (ref 36.0–46.0)
Hemoglobin: 12.3 g/dL (ref 12.0–15.0)
Lymphocytes Relative: 21.1 % (ref 12.0–46.0)
Lymphs Abs: 1.3 10*3/uL (ref 0.7–4.0)
MCHC: 32.6 g/dL (ref 30.0–36.0)
MCV: 89.5 fl (ref 78.0–100.0)
Monocytes Absolute: 0.5 10*3/uL (ref 0.1–1.0)
Monocytes Relative: 7.5 % (ref 3.0–12.0)
Neutro Abs: 4.3 10*3/uL (ref 1.4–7.7)
Neutrophils Relative %: 70.4 % (ref 43.0–77.0)
Platelets: 235 10*3/uL (ref 150.0–400.0)
RBC: 4.22 Mil/uL (ref 3.87–5.11)
RDW: 15.1 % (ref 11.5–15.5)
WBC: 6.2 10*3/uL (ref 4.0–10.5)

## 2020-09-03 LAB — SEDIMENTATION RATE: Sed Rate: 51 mm/hr — ABNORMAL HIGH (ref 0–30)

## 2020-09-03 MED ORDER — RINVOQ 15 MG PO TB24: 15.0000 mg | ORAL_TABLET | Freq: Every day | ORAL | 1 refills | Status: DC

## 2020-09-05 ENCOUNTER — Other Ambulatory Visit: Payer: 59

## 2020-09-05 DIAGNOSIS — Z20822 Contact with and (suspected) exposure to covid-19: Secondary | ICD-10-CM

## 2020-09-06 LAB — SARS-COV-2, NAA 2 DAY TAT

## 2020-09-06 LAB — NOVEL CORONAVIRUS, NAA: SARS-CoV-2, NAA: NOT DETECTED

## 2020-09-09 ENCOUNTER — Other Ambulatory Visit: Payer: Self-pay | Admitting: Physician Assistant

## 2020-09-09 DIAGNOSIS — Z20822 Contact with and (suspected) exposure to covid-19: Secondary | ICD-10-CM

## 2020-09-10 LAB — NOVEL CORONAVIRUS, NAA: SARS-CoV-2, NAA: NOT DETECTED

## 2020-09-10 LAB — SPECIMEN STATUS REPORT

## 2020-09-10 LAB — SARS-COV-2, NAA 2 DAY TAT

## 2020-09-16 MED FILL — RINVOQ 15 MG TB24: 15 | 30 days supply | Qty: 30 | Fill #0

## 2020-09-17 ENCOUNTER — Other Ambulatory Visit (HOSPITAL_COMMUNITY): Payer: Self-pay | Admitting: Rheumatology

## 2020-09-17 DIAGNOSIS — M1990 Unspecified osteoarthritis, unspecified site: Secondary | ICD-10-CM | POA: Diagnosis not present

## 2020-09-17 DIAGNOSIS — Z79899 Other long term (current) drug therapy: Secondary | ICD-10-CM | POA: Diagnosis not present

## 2020-09-17 DIAGNOSIS — R748 Abnormal levels of other serum enzymes: Secondary | ICD-10-CM | POA: Diagnosis not present

## 2020-09-17 DIAGNOSIS — M797 Fibromyalgia: Secondary | ICD-10-CM | POA: Diagnosis not present

## 2020-09-17 DIAGNOSIS — M0579 Rheumatoid arthritis with rheumatoid factor of multiple sites without organ or systems involvement: Secondary | ICD-10-CM | POA: Diagnosis not present

## 2020-09-17 MED FILL — predniSONE 10 MG TABS: 10 | 30 days supply | Qty: 30 | Fill #0

## 2020-10-05 ENCOUNTER — Other Ambulatory Visit: Payer: Self-pay | Admitting: Physician Assistant

## 2020-10-05 MED ORDER — METHOTREXATE SODIUM 2.5 MG PO TABS: 20.0000 mg | ORAL_TABLET | ORAL | 5 refills | Status: AC

## 2020-10-05 MED FILL — METHOTREXATE 2.5 MG TAB: 2.5 | 28 days supply | Qty: 32 | Fill #0

## 2020-10-05 MED FILL — FOLIC ACID 1 MG TABS: 1 | 90 days supply | Qty: 90 | Fill #0

## 2020-10-08 ENCOUNTER — Other Ambulatory Visit: Payer: Self-pay | Admitting: Family Medicine

## 2020-10-08 ENCOUNTER — Other Ambulatory Visit: Payer: Self-pay | Admitting: *Deleted

## 2020-10-08 MED ORDER — TIZANIDINE HCL 4 MG PO TABS
4.0000 mg | ORAL_TABLET | Freq: Three times a day (TID) | ORAL | 1 refills | Status: DC | PRN
Start: 2020-10-08 — End: 2020-10-08

## 2020-10-08 MED FILL — tiZANidine HCL 4 MG TABS: 4 | 10 days supply | Qty: 30 | Fill #0

## 2020-10-08 NOTE — Telephone Encounter (Signed)
Patient requesting refills

## 2020-10-10 MED FILL — GABAPENTIN 300 MG CAPSULE: 300 | 90 days supply | Qty: 270 | Fill #2

## 2020-10-19 ENCOUNTER — Telehealth: Payer: Self-pay | Admitting: Pharmacist

## 2020-10-19 MED FILL — RINVOQ 15 MG TB24: 15 | 30 days supply | Qty: 30 | Fill #1

## 2020-10-19 NOTE — Telephone Encounter (Signed)
Called patient to schedule an appointment for the [employer] [facility] [employer] [facility]. I was unable to reach the patient so I left a HIPAA-compliant message requesting that the patient return my call.   Luke Van Ausdall, PharmD, BCACP, CPP Clinical Pharmacist [facility] [phone]  

## 2020-10-29 ENCOUNTER — Other Ambulatory Visit (HOSPITAL_COMMUNITY): Payer: Self-pay

## 2020-10-29 DIAGNOSIS — M0579 Rheumatoid arthritis with rheumatoid factor of multiple sites without organ or systems involvement: Secondary | ICD-10-CM | POA: Diagnosis not present

## 2020-11-05 NOTE — Progress Notes (Signed)
Phone [phone] In person visit   Subjective:   Nancy [name] is a 55 y.o. year old very pleasant female patient who presents for/with See problem oriented charting Chief Complaint  Patient presents with  . generalized pain    Pain has been ongoing for awhile, pretty much all over body.   This visit occurred during the SARS-CoV-2 public health emergency.  Safety protocols were in place, including screening questions prior to the visit, additional usage of staff PPE, and extensive cleaning of exam room while observing appropriate contact time as indicated for disinfecting solutions.   Past Medical History-  Patient Active Problem List   Diagnosis Date Noted  . Immunosuppressed status ([place]) 12/26/2019    Priority: High  . Rheumatoid arthritis involving multiple sites with positive rheumatoid factor ([place]) 02/21/2019    Priority: High  . Fibromyalgia 04/12/2018    Priority: High  . Hyperlipidemia 12/25/2019    Priority: Medium  . Vaginal dryness 04/12/2018    Priority: Medium  . Morbid obesity ([place]) 04/12/2018    Priority: Medium  . History of colon polyps 04/12/2018    Priority: Medium  . Primary osteoarthritis of both knees 12/28/2017    Priority: Medium  . Mild intermittent asthma 11/20/2013    Priority: Medium  . Bradycardia 07/04/2012    Priority: Medium  . Vitamin B12 deficiency 02/14/2019    Priority: Low  . Vitamin D deficiency 04/12/2018    Priority: Low  . History of total hysterectomy 04/12/2018    Priority: Low  . Insomnia 04/12/2018    Priority: Low  . Cervicogenic headache 03/26/2014    Priority: Low  . Cervical radiculopathy 03/26/2014    Priority: Low  . Urge incontinence 12/20/2018  . Urinary frequency 12/20/2018    Medications- reviewed and updated Current Outpatient Medications  Medication Sig Dispense Refill  . Abatacept (ORENCIA IV) Inject into the vein.    . Albuterol Sulfate (PROAIR RESPICLICK) 623 (90 Base) MCG/ACT AEPB Inhale 1  puff into the lungs every 6 (six) hours as needed. 1 each 3  . Cholecalciferol (VITAMIN D3) 125 MCG (5000 UT) CAPS Take by mouth daily.    . Estradiol 10 MCG TABS vaginal tablet Place 1 tablet (10 mcg total) vaginally 2 (two) times a week. 24 tablet 3  . folic acid (FOLVITE) 1 MG tablet TAKE 1 TABLET BY MOUTH DAILY 90 tablet 0  . furosemide (LASIX) 20 MG tablet TAKE 1 TABLET BY MOUTH DAILY AS NEEDED FOR FLUID OR EDEMA (UP TO 3 DAYS). 30 tablet 1  . hydrochlorothiazide (HYDRODIURIL) 25 MG tablet TAKE 1 TABLET BY MOUTH DAILY AS NEEDED (FLUID RETENTION/EDEMA). 90 tablet 1  . levocetirizine (XYZAL) 5 MG tablet Take 1 tablet (5 mg total) by mouth every evening. 90 tablet 1  . ondansetron (ZOFRAN) 4 MG tablet Take 1 tablet (4 mg total) by mouth every 8 (eight) hours as needed for nausea or vomiting. 20 tablet 0  . predniSONE (DELTASONE) 20 MG tablet Take 2 pills for 3 days, 1 pill for 4 days 10 tablet 0  . tiZANidine (ZANAFLEX) 4 MG tablet TAKE 1 TABLET BY MOUTH EVERY 8 HOURS AS NEEDED FOR MUSCLE SPASMS 30 tablet 1  . traMADol (ULTRAM) 50 MG tablet Take one-two tablets every 4-6 hours as needed for moderate pain. 180 tablet 1  . Turmeric 500 MG CAPS Take by mouth 2 (two) times daily.    . vitamin C (ASCORBIC ACID) 500 MG tablet Take 500 mg by mouth as needed.    Marland Kitchen  cyanocobalamin 1000 MCG tablet Take 1,000 mcg by mouth daily. (Patient not taking: Reported on 11/09/2020)    . gabapentin (NEURONTIN) 300 MG capsule Take 1-2 capsules (300-600 mg total) by mouth 3 (three) times daily. 1 pill Am and afternoon, 2 pills before bed 360 capsule 3  . methotrexate 2.5 MG tablet Take 8 tablets (20 mg total) by mouth once a week for 6 doses. 8 tab weekly (Patient not taking: Reported on 11/09/2020) 48 tablet 5  . predniSONE (DELTASONE) 10 MG tablet TAKE 1 TABLET BY MOUTH DAILY AS NEEDED (Patient not taking: Reported on 11/09/2020) 30 tablet 0   No current facility-administered medications for this visit.     Objective:   BP 120/78   Pulse 61   Temp (!) 97.3 F (36.3 C) (Temporal)   Ht 5\' 4"  (1.626 m)   Wt 242 lb (109.8 kg)   SpO2 97%   BMI 41.54 kg/m  Gen: NAD, resting comfortably CV: RRR no murmurs rubs or gallops Lungs: CTAB no crackles, wheeze, rhonchi Ext: nonpitting edema Skin: warm, dry Patient with pain along right lateral hip and into upper portions of the thigh- with hip movement in both hips- no pain with IR, ER, flexion or extension.  Stinchfield negative Patient appears uncomfortable even try to get onto the table today but is able to do so without assist    Assessment and Plan   #fibromyalgia/rheumatoid arthritis S: patient with rheumatoid arthritis as well as fibromyalgia. Cared for by Dr. Kathlene November. Patient has always done well with prednisone in past but has not fared well with multiple injectable treatments. Sed rate 2 months ago high despite injectable treatments.   Patient states that her low back pain is bothering her again- injections were helpful last year. Requests referral back to Dr. Georgina Snell. Also right hip has been bothering her laterally and anterior- sore to touch in this area- right knee also bothering her- last injections with gel injections was not helpful- left knee was better. Feels overall weak- generalized weakness- offered PT declines for now  Areas that do not hurt: abdomen, right arm, left leg.   On gabapentin 300mg  2-3 times a day- finds helpful and wonders if higher dose would help- bedtime is a major issue for her  On tramadol 50 mg up to 2 tablets a night lately  On prednisone 10 mg perhaps every other day.   Started on orencia two weeks ago- no relief yet. Renvoq prior - helped at first and when waited 3 more months felt like she had a big set back. Was hoping would improve with warmer weather.   With transition did prednisone 20mg  daily for a week- worsened pain even above and beyond when she was on renvoq.  A/P: Patient with worsening of her chronic  pain-multiple likely contributing factors including rheumatoid arthritis, myalgia, arthritis of both knees, history of SI joint issues requiring injections.  Patient has been in severe pain-seems to respond best to prednisone -We opted to try short-term boost of prednisone and then I want her to check with rheumatology on if she should continue at a higher dose while we wait for potential benefit from [place] is also on methotrexate - patient is in severe pain and if not improving we may need to consider taking her out of work -Finds gabapentin helpful-we will trial a higher dose at night at 600 mg-renal function has been okay clinic this is reasonable -Can continue tramadol as needed  -With low back pain, knee pain, right  lateral hip pain-patient would also like referral back to sports medicine and this was placed today  Recommended follow up: Physical planned in June Future Appointments  Date Time Provider [place]  12/31/2020 10:00 AM Marin Olp, MD [facility]    Lab/Order associations:   ICD-10-CM   1. Chronic bilateral low back pain without sciatica  M54.50 Ambulatory referral to Sports Medicine   G89.29   2. Chronic pain of right knee  M25.561 Ambulatory referral to Sports Medicine   G89.29   3. Lateral pain of right hip  M25.551 Ambulatory referral to Sports Medicine  4. Rheumatoid arthritis involving multiple sites with positive rheumatoid factor (HCC)  M05.79   5. Fibromyalgia  M79.7   6. Primary osteoarthritis of both knees  M17.0   7. Immunosuppressed status ([facility])-noted on Orencia and ongoing need for prednisone.  No recent collections noted D84.9     Meds ordered this encounter  Medications  . gabapentin (NEURONTIN) 300 MG capsule    Sig: Take 1-2 capsules (300-600 mg total) by mouth 3 (three) times daily. 1 pill Am and afternoon, 2 pills before bed    Dispense:  360 capsule    Refill:  3  . predniSONE (DELTASONE) 20 MG tablet    Sig: Take 2 pills for  3 days, 1 pill for 4 days    Dispense:  10 tablet    Refill:  0     Return precautions advised.  Garret Reddish, MD

## 2020-11-05 NOTE — Patient Instructions (Addendum)
Prednisone boost for a week to see if we can settle this back down- I would ask rheumatology about continuing maybe 10 mg afterwards if very effective - we would need to wean off with extended course  Also ask rheumatology opinion on how long to see benefit  We will call you within two weeks about your referral to sports medicine. If you do not hear within 2 weeks, give [place] a call.

## 2020-11-09 ENCOUNTER — Encounter: Payer: Self-pay | Admitting: Family Medicine

## 2020-11-09 ENCOUNTER — Other Ambulatory Visit (HOSPITAL_COMMUNITY): Payer: Self-pay

## 2020-11-09 ENCOUNTER — Ambulatory Visit: Payer: 59 | Admitting: Family Medicine

## 2020-11-09 ENCOUNTER — Other Ambulatory Visit: Payer: Self-pay

## 2020-11-09 VITALS — BP 120/78 | HR 61 | Temp 97.3°F | Ht 64.0 in | Wt 242.0 lb

## 2020-11-09 DIAGNOSIS — G8929 Other chronic pain: Secondary | ICD-10-CM

## 2020-11-09 DIAGNOSIS — M25551 Pain in right hip: Secondary | ICD-10-CM

## 2020-11-09 DIAGNOSIS — M797 Fibromyalgia: Secondary | ICD-10-CM | POA: Diagnosis not present

## 2020-11-09 DIAGNOSIS — D849 Immunodeficiency, unspecified: Secondary | ICD-10-CM | POA: Diagnosis not present

## 2020-11-09 DIAGNOSIS — M25561 Pain in right knee: Secondary | ICD-10-CM

## 2020-11-09 DIAGNOSIS — M0579 Rheumatoid arthritis with rheumatoid factor of multiple sites without organ or systems involvement: Secondary | ICD-10-CM

## 2020-11-09 DIAGNOSIS — M170 Bilateral primary osteoarthritis of knee: Secondary | ICD-10-CM

## 2020-11-09 DIAGNOSIS — M545 Low back pain, unspecified: Secondary | ICD-10-CM | POA: Diagnosis not present

## 2020-11-09 MED ORDER — PREDNISONE 20 MG PO TABS
ORAL_TABLET | ORAL | 0 refills | Status: DC
  Filled 2020-11-09: qty 10, 7d supply, fill #0

## 2020-11-09 MED ORDER — GABAPENTIN 300 MG PO CAPS
300.0000 mg | ORAL_CAPSULE | Freq: Three times a day (TID) | ORAL | 3 refills | Status: DC
  Filled 2020-11-09: qty 360, 60d supply, fill #0
  Filled 2020-11-09: qty 360, 90d supply, fill #0
  Filled 2020-12-21: qty 360, 60d supply, fill #0
  Filled 2021-04-28: qty 360, 90d supply, fill #1
  Filled 2021-07-29: qty 360, 90d supply, fill #2

## 2020-11-10 ENCOUNTER — Other Ambulatory Visit (HOSPITAL_COMMUNITY): Payer: Self-pay

## 2020-11-11 ENCOUNTER — Other Ambulatory Visit (HOSPITAL_COMMUNITY): Payer: Self-pay

## 2020-11-12 ENCOUNTER — Ambulatory Visit (INDEPENDENT_AMBULATORY_CARE_PROVIDER_SITE_OTHER): Payer: 59

## 2020-11-12 ENCOUNTER — Other Ambulatory Visit (HOSPITAL_COMMUNITY): Payer: Self-pay

## 2020-11-12 ENCOUNTER — Other Ambulatory Visit: Payer: Self-pay

## 2020-11-12 ENCOUNTER — Ambulatory Visit: Payer: 59 | Admitting: Family Medicine

## 2020-11-12 ENCOUNTER — Ambulatory Visit: Payer: Self-pay

## 2020-11-12 VITALS — BP 134/82 | HR 60 | Ht 64.0 in | Wt 250.2 lb

## 2020-11-12 DIAGNOSIS — M25561 Pain in right knee: Secondary | ICD-10-CM | POA: Diagnosis not present

## 2020-11-12 DIAGNOSIS — M0579 Rheumatoid arthritis with rheumatoid factor of multiple sites without organ or systems involvement: Secondary | ICD-10-CM | POA: Diagnosis not present

## 2020-11-12 DIAGNOSIS — M25551 Pain in right hip: Secondary | ICD-10-CM | POA: Diagnosis not present

## 2020-11-12 DIAGNOSIS — M533 Sacrococcygeal disorders, not elsewhere classified: Secondary | ICD-10-CM

## 2020-11-12 DIAGNOSIS — G8929 Other chronic pain: Secondary | ICD-10-CM

## 2020-11-12 LAB — COMPREHENSIVE METABOLIC PANEL
Albumin: 4 (ref 3.5–5.0)
Calcium: 9.4 (ref 8.7–10.7)
Globulin: 2.8

## 2020-11-12 LAB — CBC AND DIFFERENTIAL
HCT: 35 — AB (ref 36–46)
Hemoglobin: 10.7 — AB (ref 12.0–16.0)
Platelets: 204 (ref 150–399)
WBC: 7

## 2020-11-12 LAB — HEPATIC FUNCTION PANEL
ALT: 14 (ref 7–35)
AST: 17 (ref 13–35)
Alkaline Phosphatase: 95 (ref 25–125)
Bilirubin, Total: 0.2

## 2020-11-12 LAB — BASIC METABOLIC PANEL
BUN: 21 (ref 4–21)
CO2: 23 — AB (ref 13–22)
Chloride: 101 (ref 99–108)
Creatinine: 0.9 (ref 0.5–1.1)
Glucose: 71
Potassium: 4.6 (ref 3.4–5.3)
Sodium: 146 (ref 137–147)

## 2020-11-12 LAB — CBC: RBC: 3.73 — AB (ref 3.87–5.11)

## 2020-11-12 MED ORDER — METHOTREXATE 2.5 MG PO TABS
ORAL_TABLET | ORAL | 4 refills | Status: DC
  Filled 2020-11-12: qty 32, 28d supply, fill #0
  Filled 2020-12-21: qty 32, 28d supply, fill #1
  Filled 2021-01-18: qty 32, 28d supply, fill #2
  Filled 2021-02-11: qty 32, 28d supply, fill #3

## 2020-11-12 MED ORDER — RINVOQ 15 MG PO TB24
ORAL_TABLET | ORAL | 1 refills | Status: DC
  Filled 2020-11-12: qty 30, 30d supply, fill #0

## 2020-11-12 NOTE — Patient Instructions (Addendum)
Thank you for coming in today.  Please complete the exercises that the athletic trainer went over with you: View at [email or web address] using code: [facility]  Call or go to the ER if you develop a large red swollen joint with extreme pain or oozing puss.   Please get an Xray today before you leave  Keep me updated.   I hope the new RA med will help.

## 2020-11-12 NOTE — Progress Notes (Signed)
I, [name], LAT, ATC, am serving as scribe for Dr. Lynne Leader.  Nancy [name] is a 55 y.o. female who presents to [facility] today for f/u of B knee pain, R hip pain, and LBP.  She was last seen by Dr. Georgina Snell on 07/09/20 for her final B knee Orthovisc injections.  She has been diagnosed w/ both RA and fibromyalgia.  She takes prednisone, Gabapentin and Tramadol.  Since her last visit, pt reports R knee pain that started bothering her again in Jan. Pt locates knee pain to the anterior-lateral aspect.   Pt also c/o bilat low back pain. Pt locates pain to bilat SI joints.  Diagnostic testing: L-spine MRI- 04/02/20  Pertinent review of systems: No fevers or chills  Relevant historical information: Rheumatoid arthritis   Exam:  BP 134/82 (BP Location: Right Arm, Patient Position: Sitting, Cuff Size: Normal)   Pulse 60   Ht 5\' 4"  (1.626 m)   Wt 250 lb 3.2 oz (113.5 kg)   SpO2 97%   BMI 42.95 kg/m  General: Well Developed, well nourished, and in no acute distress.   MSK: Hip: Tender palpation bilateral SI joints. Right hip normal-appearing tender palpation greater trochanter.  Minimal tenderness and pain in anterior hip with internal rotation and flexion.  Right knee mild effusion normal motion.  Diffusely tender.    Lab and Radiology Results  X-ray images right hip and right knee obtained today personally and independently interpreted  Right hip: Minimal right hip OA. Minimal sclerosis SI joints bilaterally.   No fractures visible.  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  Await formal radiology review  Procedure: Real-time Ultrasound Guided Injection of right SI joint Device: Philips Affiniti 50G Images permanently stored and available for review in PACS Verbal informed consent obtained.  Discussed risks and benefits of procedure. Warned about infection bleeding damage to structures skin hypopigmentation  and fat atrophy among others. Patient expresses understanding and agreement Time-out conducted.   Noted no overlying erythema, induration, or other signs of local infection.   Skin prepped in a sterile fashion.   Local anesthesia: Topical Ethyl chloride.   With sterile technique and under real time ultrasound guidance:  40 mg of Kenalog and 2 mL of Marcaine injected into SI joint. Fluid seen entering the joint capsule.   Completed without difficulty   Pain immediately resolved suggesting accurate placement of the medication.   Advised to call if fevers/chills, erythema, induration, drainage, or persistent bleeding.   Images permanently stored and available for review in the ultrasound unit.  Impression: Technically successful ultrasound guided injection.   Procedure: Real-time Ultrasound Guided Injection of left SI joint Device: Philips Affiniti 50G Images permanently stored and available for review in PACS Verbal informed consent obtained.  Discussed risks and benefits of procedure. Warned about infection bleeding damage to structures skin hypopigmentation and fat atrophy among others. Patient expresses understanding and agreement Time-out conducted.   Noted no overlying erythema, induration, or other signs of local infection.   Skin prepped in a sterile fashion.   Local anesthesia: Topical Ethyl chloride.   With sterile technique and under real time ultrasound guidance:  40 mg of Kenalog and 2 mL of Marcaine injected into joint. Fluid seen entering the joint capsule.   Completed without difficulty   Pain immediately resolved suggesting accurate placement of the medication.   Advised to call if fevers/chills, erythema, induration, drainage, or persistent bleeding.   Images permanently stored and  available for review in the ultrasound unit.  Impression: Technically successful ultrasound guided injection.   Procedure: Real-time Ultrasound Guided Injection of right knee joint superior  lateral patellar space Device: Philips Affiniti 50G Images permanently stored and available for review in PACS Verbal informed consent obtained.  Discussed risks and benefits of procedure. Warned about infection bleeding damage to structures skin hypopigmentation and fat atrophy among others. Patient expresses understanding and agreement Time-out conducted.   Noted no overlying erythema, induration, or other signs of local infection.   Skin prepped in a sterile fashion.   Local anesthesia: Topical Ethyl chloride.   With sterile technique and under real time ultrasound guidance:  40 mg of Kenalog and 2 mL of Marcaine injected into joint. Fluid seen entering the joint capsule.   Completed without difficulty   Pain immediately resolved suggesting accurate placement of the medication.   Advised to call if fevers/chills, erythema, induration, drainage, or persistent bleeding.   Images permanently stored and available for review in the ultrasound unit.  Impression: Technically successful ultrasound guided injection.     Assessment and Plan: 55 y.o. female with bilateral posterior hip pain at [facility] joint.  Patient had previous SI joint injections March 2021 and did very well.  Pain today is consistent with prior pain.  Plan for repeat injection and watchful waiting.  Right knee pain thought to be due to DJD and rheumatoid arthritis.  Patient is currently in process of switching RA medications.  She had additionally relatively recent trial of hyaluronic acid injections and December 2021 that did not help much at all.  Plan for trial of steroid injection right knee.  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  The cumulative dose of steroids that she is getting today systemically will probably help some.  Additionally plan for home exercise program taught in clinic today by ATC for hip abductor strengthening. Does not have much hip OA to suggest DJD as a significant  cause of hip pain.   PDMP not reviewed this encounter. Orders Placed This Encounter  Procedures  . [place] LIMITED JOINT SPACE STRUCTURES LOW RIGHT(NO LINKED CHARGES)    Standing Status:   Future    Number of Occurrences:   1    Standing Expiration Date:   05/14/2021    Order Specific Question:   Reason for Exam (SYMPTOM  OR DIAGNOSIS REQUIRED)    Answer:   chronic right knee pain    Order Specific Question:   Preferred imaging location?    Answer:   [facility]  . DG Knee AP/LAT W/Sunrise Right    Standing Status:   Future    Number of Occurrences:   1    Standing Expiration Date:   11/12/2021    Order Specific Question:   Reason for Exam (SYMPTOM  OR DIAGNOSIS REQUIRED)    Answer:   chronic right knee pain    Order Specific Question:   Preferred imaging location?    Answer:   [facility]    Order Specific Question:   Is patient pregnant?    Answer:   No  . DG HIP UNILAT W OR W/O PELVIS 2-3 VIEWS RIGHT    Standing Status:   Future    Number of Occurrences:   1    Standing Expiration Date:   11/12/2021    Order Specific Question:   Reason for Exam (SYMPTOM  OR DIAGNOSIS REQUIRED)    Answer:   right hip  pain    Order Specific Question:   Preferred imaging location?    Answer:   [facility]    Order Specific Question:   Is patient pregnant?    Answer:   No   No orders of the defined types were placed in this encounter.    Discussed warning signs or symptoms. Please see discharge instructions. Patient expresses understanding.   The above documentation has been reviewed and is accurate and complete Lynne Leader, M.D.

## 2020-11-13 ENCOUNTER — Other Ambulatory Visit (HOSPITAL_COMMUNITY): Payer: Self-pay

## 2020-11-16 ENCOUNTER — Other Ambulatory Visit (HOSPITAL_COMMUNITY): Payer: Self-pay

## 2020-11-16 NOTE — Progress Notes (Signed)
X-ray right hip does not show much arthritis at all.

## 2020-11-16 NOTE — Progress Notes (Signed)
X-ray right knee shows mild arthritis.

## 2020-11-26 DIAGNOSIS — M0579 Rheumatoid arthritis with rheumatoid factor of multiple sites without organ or systems involvement: Secondary | ICD-10-CM | POA: Diagnosis not present

## 2020-12-01 ENCOUNTER — Encounter: Payer: Self-pay | Admitting: Family Medicine

## 2020-12-01 ENCOUNTER — Other Ambulatory Visit: Payer: Self-pay | Admitting: Family Medicine

## 2020-12-01 DIAGNOSIS — Z1231 Encounter for screening mammogram for malignant neoplasm of breast: Secondary | ICD-10-CM

## 2020-12-01 DIAGNOSIS — Z780 Asymptomatic menopausal state: Secondary | ICD-10-CM

## 2020-12-02 ENCOUNTER — Other Ambulatory Visit: Payer: Self-pay

## 2020-12-02 ENCOUNTER — Other Ambulatory Visit (INDEPENDENT_AMBULATORY_CARE_PROVIDER_SITE_OTHER): Payer: 59

## 2020-12-02 DIAGNOSIS — M0579 Rheumatoid arthritis with rheumatoid factor of multiple sites without organ or systems involvement: Secondary | ICD-10-CM

## 2020-12-02 DIAGNOSIS — D649 Anemia, unspecified: Secondary | ICD-10-CM

## 2020-12-02 LAB — CBC WITH DIFFERENTIAL/PLATELET
Basophils Absolute: 0 10*3/uL (ref 0.0–0.1)
Basophils Relative: 0.4 % (ref 0.0–3.0)
Eosinophils Absolute: 0.1 10*3/uL (ref 0.0–0.7)
Eosinophils Relative: 1 % (ref 0.0–5.0)
HCT: 36 % (ref 36.0–46.0)
Hemoglobin: 11.8 g/dL — ABNORMAL LOW (ref 12.0–15.0)
Lymphocytes Relative: 19.4 % (ref 12.0–46.0)
Lymphs Abs: 1.3 10*3/uL (ref 0.7–4.0)
MCHC: 32.9 g/dL (ref 30.0–36.0)
MCV: 89.9 fl (ref 78.0–100.0)
Monocytes Absolute: 0.6 10*3/uL (ref 0.1–1.0)
Monocytes Relative: 8.6 % (ref 3.0–12.0)
Neutro Abs: 4.9 10*3/uL (ref 1.4–7.7)
Neutrophils Relative %: 70.6 % (ref 43.0–77.0)
Platelets: 186 10*3/uL (ref 150.0–400.0)
RBC: 4 Mil/uL (ref 3.87–5.11)
RDW: 16.3 % — ABNORMAL HIGH (ref 11.5–15.5)
WBC: 6.9 10*3/uL (ref 4.0–10.5)

## 2020-12-02 LAB — SEDIMENTATION RATE: Sed Rate: 48 mm/hr — ABNORMAL HIGH (ref 0–30)

## 2020-12-21 ENCOUNTER — Other Ambulatory Visit (HOSPITAL_COMMUNITY): Payer: Self-pay

## 2020-12-21 ENCOUNTER — Other Ambulatory Visit: Payer: Self-pay | Admitting: *Deleted

## 2020-12-21 MED ORDER — HYDROCHLOROTHIAZIDE 25 MG PO TABS
ORAL_TABLET | ORAL | 1 refills | Status: DC
  Filled 2020-12-21: qty 90, 90d supply, fill #0
  Filled 2021-07-29: qty 90, 90d supply, fill #1

## 2020-12-24 DIAGNOSIS — M0579 Rheumatoid arthritis with rheumatoid factor of multiple sites without organ or systems involvement: Secondary | ICD-10-CM | POA: Diagnosis not present

## 2020-12-31 ENCOUNTER — Ambulatory Visit (INDEPENDENT_AMBULATORY_CARE_PROVIDER_SITE_OTHER): Payer: 59 | Admitting: Family Medicine

## 2020-12-31 ENCOUNTER — Other Ambulatory Visit (HOSPITAL_COMMUNITY): Payer: Self-pay

## 2020-12-31 ENCOUNTER — Encounter: Payer: Self-pay | Admitting: Family Medicine

## 2020-12-31 ENCOUNTER — Other Ambulatory Visit: Payer: Self-pay | Admitting: Family Medicine

## 2020-12-31 VITALS — BP 138/82 | HR 52 | Temp 97.3°F | Ht 64.0 in | Wt 245.0 lb

## 2020-12-31 DIAGNOSIS — M0579 Rheumatoid arthritis with rheumatoid factor of multiple sites without organ or systems involvement: Secondary | ICD-10-CM

## 2020-12-31 DIAGNOSIS — E538 Deficiency of other specified B group vitamins: Secondary | ICD-10-CM

## 2020-12-31 DIAGNOSIS — G8929 Other chronic pain: Secondary | ICD-10-CM

## 2020-12-31 DIAGNOSIS — M797 Fibromyalgia: Secondary | ICD-10-CM

## 2020-12-31 DIAGNOSIS — M5441 Lumbago with sciatica, right side: Secondary | ICD-10-CM

## 2020-12-31 DIAGNOSIS — D849 Immunodeficiency, unspecified: Secondary | ICD-10-CM

## 2020-12-31 DIAGNOSIS — E559 Vitamin D deficiency, unspecified: Secondary | ICD-10-CM | POA: Diagnosis not present

## 2020-12-31 DIAGNOSIS — Z0000 Encounter for general adult medical examination without abnormal findings: Secondary | ICD-10-CM | POA: Diagnosis not present

## 2020-12-31 DIAGNOSIS — M5442 Lumbago with sciatica, left side: Secondary | ICD-10-CM

## 2020-12-31 DIAGNOSIS — E785 Hyperlipidemia, unspecified: Secondary | ICD-10-CM | POA: Diagnosis not present

## 2020-12-31 LAB — CBC WITH DIFFERENTIAL/PLATELET
Basophils Absolute: 0 10*3/uL (ref 0.0–0.1)
Basophils Relative: 0.4 % (ref 0.0–3.0)
Eosinophils Absolute: 0.1 10*3/uL (ref 0.0–0.7)
Eosinophils Relative: 0.8 % (ref 0.0–5.0)
HCT: 38.8 % (ref 36.0–46.0)
Hemoglobin: 12.3 g/dL (ref 12.0–15.0)
Lymphocytes Relative: 19.9 % (ref 12.0–46.0)
Lymphs Abs: 1.6 10*3/uL (ref 0.7–4.0)
MCHC: 31.8 g/dL (ref 30.0–36.0)
MCV: 89.7 fl (ref 78.0–100.0)
Monocytes Absolute: 0.6 10*3/uL (ref 0.1–1.0)
Monocytes Relative: 8.1 % (ref 3.0–12.0)
Neutro Abs: 5.5 10*3/uL (ref 1.4–7.7)
Neutrophils Relative %: 70.8 % (ref 43.0–77.0)
Platelets: 237 10*3/uL (ref 150.0–400.0)
RBC: 4.33 Mil/uL (ref 3.87–5.11)
RDW: 15.5 % (ref 11.5–15.5)
WBC: 7.8 10*3/uL (ref 4.0–10.5)

## 2020-12-31 LAB — VITAMIN D 25 HYDROXY (VIT D DEFICIENCY, FRACTURES): VITD: 59.8 ng/mL (ref 30.00–100.00)

## 2020-12-31 LAB — TSH: TSH: 2.29 u[IU]/mL (ref 0.35–4.50)

## 2020-12-31 LAB — VITAMIN B12: Vitamin B-12: 239 pg/mL (ref 211–911)

## 2020-12-31 MED ORDER — ESTRADIOL 0.1 MG/GM VA CREA
TOPICAL_CREAM | VAGINAL | 0 refills | Status: DC
  Filled 2020-12-31: qty 42.5, 30d supply, fill #0

## 2020-12-31 MED ORDER — FOLIC ACID 1 MG PO TABS
ORAL_TABLET | Freq: Every day | ORAL | 3 refills | Status: DC
  Filled 2020-12-31: qty 90, 90d supply, fill #0
  Filled 2021-03-27: qty 90, 90d supply, fill #1
  Filled 2021-06-22: qty 90, 90d supply, fill #2
  Filled 2021-10-20: qty 90, 90d supply, fill #3

## 2020-12-31 MED ORDER — PREDNISONE 10 MG PO TABS
ORAL_TABLET | Freq: Every day | ORAL | 1 refills | Status: DC | PRN
  Filled 2020-12-31: qty 30, 30d supply, fill #0

## 2020-12-31 MED ORDER — PROAIR RESPICLICK 108 (90 BASE) MCG/ACT IN AEPB
1.0000 | INHALATION_SPRAY | Freq: Four times a day (QID) | RESPIRATORY_TRACT | 3 refills | Status: DC | PRN
  Filled 2020-12-31: qty 1, 30d supply, fill #0

## 2020-12-31 MED ORDER — ESTRADIOL 10 MCG VA TABS
1.0000 | ORAL_TABLET | VAGINAL | 3 refills | Status: DC
  Filled 2020-12-31: qty 24, 84d supply, fill #0
  Filled 2021-12-15: qty 24, 84d supply, fill #1

## 2020-12-31 NOTE — Patient Instructions (Addendum)
Health Maintenance Due  Topic Date Due  . Zoster Vaccines- Shingrix (1 of 2) Patient can't get until she's 58 due to insurance.  Never done  . COLONOSCOPY [facility] contact- due for 5 year colonoscopy Address: [address], [place], [place] [address] Phone: [phone]  11/26/2020   Please stop by lab before you go If you have mychart- we will send your results within 3 business days of [place] receiving them.  If you do not have mychart- we will call you about results within 5 business days of [place] receiving them.  *please also note that you will see labs on mychart as soon as they post. I will later go in and write notes on them- will say "notes from Dr. Yong Channel"    Recommended follow up: Return in about 6 months (around 07/02/2021) for follow up- or sooner if needed.

## 2020-12-31 NOTE — Progress Notes (Addendum)
Phone [phone]   Subjective:  Patient presents today for their annual physical. Chief complaint-noted.   See problem oriented charting- ROS- full  review of systems was completed and negative except for: fatigue, palpitation, joint pain, back pain, muscle aches, immunocompromised,bruising or bleeds easily.  The following were reviewed and entered/updated in epic: Past Medical History:  Diagnosis Date  . Anemia   . Asthma   . Bradycardia   . Chicken pox   . Dysrhythmia    occ. palpitations  . Fibromyalgia   . Headache(784.0)   . Migraines   . Neck pain    bulging disk  . Polyarthralgia 02/21/2019  . PONV (postoperative nausea and vomiting)    sometimes wakes up after surgery and cannot breath due to asthma, can be bradycardic  . Rheumatoid arthritis involving multiple sites with positive rheumatoid factor ([place]) 02/21/2019  . Shortness of breath    with bradycardia   Patient Active Problem List   Diagnosis Date Noted  . Immunosuppressed status ([place]) 12/26/2019    Priority: High  . Rheumatoid arthritis involving multiple sites with positive rheumatoid factor ([place]) 02/21/2019    Priority: High  . Fibromyalgia 04/12/2018    Priority: High  . Hyperlipidemia 12/25/2019    Priority: Medium  . Vaginal dryness 04/12/2018    Priority: Medium  . Morbid obesity ([place]) 04/12/2018    Priority: Medium  . History of colon polyps 04/12/2018    Priority: Medium  . Primary osteoarthritis of both knees 12/28/2017    Priority: Medium  . Mild intermittent asthma 11/20/2013    Priority: Medium  . Bradycardia 07/04/2012    Priority: Medium  . Vitamin B12 deficiency 02/14/2019    Priority: Low  . Vitamin D deficiency 04/12/2018    Priority: Low  . History of total hysterectomy 04/12/2018    Priority: Low  . Insomnia 04/12/2018    Priority: Low  . Cervicogenic headache 03/26/2014    Priority: Low  . Cervical radiculopathy 03/26/2014    Priority: Low  . Urge incontinence  12/20/2018  . Urinary frequency 12/20/2018   Past Surgical History:  Procedure Laterality Date  . ABDOMINAL HYSTERECTOMY    . BACK SURGERY     cervical fusion C5,6,7 with a bulge at C4  . BLADDER SURGERY  July 10th 2013  . [facility]  . CYSTOSCOPY N/A 07/31/2018   Procedure: CYSTOSCOPY;  Surgeon: Bjorn Loser, MD;  Location: [facility];  Service: Urology;  Laterality: N/A;  . DIAGNOSTIC LAPAROSCOPY    . EXCISION OF MESH N/A 07/31/2018   Procedure: REMOVAL OF VAGINAL MESH;  Surgeon: Bjorn Loser, MD;  Location: [facility];  Service: Urology;  Laterality: N/A;  . LAPAROSCOPY Bilateral 05/31/2013   Procedure: LAPAROSCOPY withbilateral salpingo-oophorectomy, lysis of adhesions;  Surgeon: Allena Katz, MD;  Location: [facility];  Service: Gynecology;  Laterality: Bilateral;  with clippings of Vaginal  mesh  . SALPINGOOPHORECTOMY Bilateral 05/31/2013   Procedure: BILATERAL SALPINGO OOPHORECTOMY/TRIM MESH;  Surgeon: Allena Katz, MD;  Location: [facility];  Service: Gynecology;  Laterality: Bilateral;  . TUBAL LIGATION     1995  . VAGINAL HYSTERECTOMY     1998 or 1997  . WRIST FRACTURE SURGERY  2005    Family History  Problem Relation Age of Onset  . Hyperlipidemia Mother        13 in 2019  . Arthritis Mother   . Osteoporosis Mother   . Lumbar disc disease Mother  herniated discs.   Marland Kitchen COPD Mother   . Hypertension Father        doesnt know his history well- no recent contact  . Heart disease Maternal Grandmother   . Diabetes Maternal Grandmother   . Cervical cancer Maternal Grandmother   . Heart disease Maternal Grandfather   . Cancer - Colon Maternal Grandfather   . Diabetes Paternal Grandmother   . Lung cancer Paternal Grandmother        dad's parents stepped in to help when dad not present  . Other Sister        doesnt know history well  . Lumbar disc disease Brother        grew up with him   . Atrial fibrillation Paternal Grandfather         required a few shocks  . Bradycardia Paternal Grandfather   . Hypertension Paternal Grandfather   . Other Brother        doesnt know history well    Medications- reviewed and updated Current Outpatient Medications  Medication Sig Dispense Refill  . Abatacept (ORENCIA IV) Inject into the vein.    . Cholecalciferol (VITAMIN D3) 125 MCG (5000 UT) CAPS Take by mouth daily.    . cyanocobalamin 1000 MCG tablet Take 1,000 mcg by mouth daily.    Marland Kitchen estradiol (ESTRACE) 0.1 MG/GM vaginal cream 0.5 g intravaginally 1-3 times per week. Short course before starting tablets back 42.5 g 0  . gabapentin (NEURONTIN) 300 MG capsule Take 1 capsule by mouth each morning and 1 capsule each afternoon and 2 capsules before bed as directed 360 capsule 3  . hydrochlorothiazide (HYDRODIURIL) 25 MG tablet TAKE 1 TABLET BY MOUTH DAILY AS NEEDED (FLUID RETENTION/EDEMA). 90 tablet 1  . levocetirizine (XYZAL) 5 MG tablet Take 1 tablet (5 mg total) by mouth every evening. 90 tablet 1  . melatonin 5 MG TABS 1 tablet at bedtime as needed with food    . methotrexate (RHEUMATREX) 2.5 MG tablet Take 8 tablets  (20 Mg) by mouth once a week (Patient taking differently: Take 8 tablets  (20 Mg) by mouth once a week) 48 tablet 4  . ondansetron (ZOFRAN) 4 MG tablet Take 1 tablet (4 mg total) by mouth every 8 (eight) hours as needed for nausea or vomiting. 20 tablet 0  . tiZANidine (ZANAFLEX) 4 MG tablet TAKE 1 TABLET BY MOUTH EVERY 8 HOURS AS NEEDED FOR MUSCLE SPASMS 30 tablet 1  . traMADol (ULTRAM) 50 MG tablet Take one-two tablets every 4-6 hours as needed for moderate pain. 180 tablet 1  . Turmeric 500 MG CAPS Take by mouth 2 (two) times daily.    . vitamin C (ASCORBIC ACID) 500 MG tablet Take 500 mg by mouth as needed.    . Albuterol Sulfate (PROAIR RESPICLICK) 416 (90 Base) MCG/ACT AEPB Inhale 1 puff into the lungs every 6 (six) hours as needed. 1 each 3  . Estradiol 10 MCG TABS vaginal tablet Place 1 tablet (10 mcg total)  vaginally 2 (two) times a week. 24 tablet 3  . folic acid (FOLVITE) 1 MG tablet TAKE 1 TABLET BY MOUTH DAILY 90 tablet 3  . predniSONE (DELTASONE) 10 MG tablet TAKE 1 TABLET BY MOUTH DAILY AS NEEDED 30 tablet 1   No current facility-administered medications for this visit.    Allergies-reviewed and updated Allergies  Allergen Reactions  . Advair Diskus [Fluticasone-Salmeterol] Cough    excessive  . Erythromycin Diarrhea and Nausea And Vomiting  . Percocet [Oxycodone-Acetaminophen]  Other (See Comments)    hallucinations    Social History   Social History Narrative   Married to husband Shanon Brow. Son Gerald Stabs and Daughter Reggy Eye.       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       Hobbies: movies, reading, hiking   Right handed   Objective  Objective:  BP 138/82   Pulse (!) 52   Temp (!) 97.3 F (36.3 C) (Temporal)   Ht 5\' 4"  (1.626 m)   Wt 245 lb (111.1 kg)   SpO2 96%   BMI 42.05 kg/m  Gen: NAD, resting comfortably HEENT: Mucous membranes are moist. Oropharynx normal, TMs, nasal turbinates Neck: no thyromegaly, no carotid bruits, CV: RRR no murmurs rubs or gallops Lungs: CTAB no crackles, wheeze, rhonchi Abdomen: soft/nontender/nondistended/normal bowel sounds. No rebound or guarding.  Ext: trace to 1+ edema bilaterally Skin: warm, dry Neuro: grossly normal, moves all extremities, PERRLA   Assessment and Plan   55 y.o. female presenting for annual physical.  Health Maintenance counseling: 1. Anticipatory guidance: Patient counseled regarding regular dental exams -q6 months-had 2 front teeth replaced in the past-that went well, eye exams,  avoiding smoking and second hand smoke , limiting alcohol to 1 beverage per day- rare social .   2. Risk factor reduction:  Advised patient of need for regular exercise and diet rich and fruits and vegetables to reduce risk of heart attack and stroke. Exercise-plans to start back soon-planning on joining [place]-  trying to make dietary changes- eating more meats and vegetables, no processed foods or carbs. Wt Readings from Last 3 Encounters:  12/31/20 245 lb (111.1 kg)  11/12/20 250 lb 3.2 oz (113.5 kg)  11/09/20 242 lb (109.8 kg)  3. Immunizations/screenings/ancillary studies- Shingrix- her insurance will not pay until age 51, interestingly. Wants to hold off on [place] vaccination #4-we discussed with her immunocompromise status moving forward with this but she prefers to hold off-she will discuss with dermatology as well. Immunization History  Administered Date(s) Administered  . Influenza,inj,Quad PF,6+ Mos 04/24/2018  . Influenza-Unspecified 05/02/2015, 05/04/2016, 05/08/2017, 05/03/2019, 05/22/2020  . PFIZER(Purple Top)SARS-COV-2 Vaccination 07/24/2019, 08/12/2019, 08/08/2020  . Tdap 08/26/2015   4. Cervical cancer screening- hysterectomy for benign reasons- she is not seeing Dr. Talbert Nan- last visit several years ago- 3.5 years approxiamately. 5. Breast cancer screening-  breast exam with Dr. Talbert Nan of gynecology and mammogram 08/15/2019 3D-she is also scheduled for 01/28/2021- Dexa scheduled for Fall 2022 6. Colon cancer screening - 11/27/15 with 5-year repeat- she is due early 2022 due to history of sessile serrated polyp. 7. Skin cancer screening- no dermatologist. advised regular sunscreen use. Denies worrisome, changing, or new skin lesions.  8. Birth control/STD check- hysterectomy-monogamous 9. Osteoporosis screening at 47- she actually opted to go ahead and update this given postmenopausal and intermittent predinose use.  Scheduled 05/20/2021 -never smoker  Status of chronic or acute concerns   #Flare Ups of Lower Back- Prednisone 10 mg took for short period over the last week and found it somewhat helpful but has run out of prednisone tablets-would like to have a refill available-uses sparingly with flares of pain- has had the most profound response to this of any other medication.  Recently  has been dealing with worsening low back pain radiating into both lower legs  #Fibromyalgia/rheumatoid arthritis-immunosuppressed status noted well S: At last visit in April patient with worsening chronic pain-thought multifactorial between rheumatoid arthritis, fibromyalgia, arthritis bilateral knees, history of SI joint issues  requiring injections.  She is following with rheumatology and was on [place] as well as methotrexate-we are hoping for further improvement with those treatments but in the short run increased her prednisone as well as gabapentin.  She did find increasing gabapentin helpful.  She does have tramadol available as needed -We also placed a referral to sports medicine last visit- has gone recently and is doing well  -SI did not help but knee did help -does experience slight increased swelling with Gabapentin A/P: Prior fibromyalgia or rheumatoid arthritis flare from last visit has certainly improved-has follow-up with rheumatology tomorrow.  She is still having some back pain wants to discuss with them-I gave her a copy of her MRI report from September 2021  #Vitamin D deficiency S: Medication: 10000 units a day,  Last vitamin D Lab Results  Component Value Date   VD25OH 27.37 (L) 12/26/2019  A/P: Patient on high-dose vitamin D-update vitamin D levels today and make sure not trending up to drastically-may need to decrease dose  # B12 deficiency S: Current treatment/medication (oral vs. IM): intermittently getting 1000 mcg/day-has missed for several weeks Lab Results  Component Value Date   VITAMINB12 291 12/26/2019  A/P: We will update B12 today- hopefully not deficient with intermittently missing doses  #mild intermittent asthma- medications: Albuterol 108 MCG/no recent issues- has not had to use inhaler recently.  We will refill this as last inhaler may be expired  #hyperlipidemia S: Medication: None. -lipid elevation related to rinvoq last year Lab Results   Component Value Date   CHOL 260 (H) 09/03/2020   HDL 85.00 09/03/2020   LDLCALC 155 (H) 09/03/2020   TRIG 98.0 09/03/2020   CHOLHDL 3 09/03/2020   A/P: Hopefully improved off her rinvoq-update lipids with labs today  #Bradycardia- seen Dr. Martinique in past. Intermittent PVCs and PACs- some palpitations with stable  #Sleep- medications: melatonin 5 mg helpful-we discussed possibly using half tablet instead  #Vaginal dryness- patient has missed several doses of Estrace tablets-previously prescribed by gynecology but has not seen them in over a year-she asked me to refill these but she has had some success with using Estrace for a few days before starting back on Estrace tablets-I refilled this as well  #High risk medication use-patient on folic acid due to being on methotrexate-asked for refill today-provided   Recommended follow up: Return in about 6 months (around 07/02/2021) for follow up- or sooner if needed. Future Appointments  Date Time Provider [place]  01/28/2021  4:00 PM [facility] MM 3 [facility]  05/20/2021  4:00 PM [facility] DX DEXA 1 [facility]   Lab/Order associations: fasting   ICD-10-CM   1. Preventative health care  Z00.00 CBC with Differential/Platelet    Comprehensive metabolic panel    Lipid panel    VITAMIN D 25 Hydroxy (Vit-D Deficiency, Fractures)    Vitamin B12  2. Morbid obesity (HCC) Chronic E66.01   3. Rheumatoid arthritis involving multiple sites with positive rheumatoid factor (HCC)  M05.79   4. Immunosuppressed status ([place])  D84.9   5. Vitamin D deficiency  E55.9 VITAMIN D 25 Hydroxy (Vit-D Deficiency, Fractures)  6. Vitamin B12 deficiency  E53.8 Vitamin B12  7. Fibromyalgia  M79.7   8. Hyperlipidemia, unspecified hyperlipidemia type  E78.5 CBC with Differential/Platelet    Comprehensive metabolic panel    Lipid panel    TSH    Meds ordered this encounter  Medications  . Albuterol Sulfate (PROAIR RESPICLICK) 124 (90 Base)  MCG/ACT AEPB  Sig: Inhale 1 puff into the lungs every 6 (six) hours as needed.    Dispense:  1 each    Refill:  3  . predniSONE (DELTASONE) 10 MG tablet    Sig: TAKE 1 TABLET BY MOUTH DAILY AS NEEDED    Dispense:  30 tablet    Refill:  1  . folic acid (FOLVITE) 1 MG tablet    Sig: TAKE 1 TABLET BY MOUTH DAILY    Dispense:  90 tablet    Refill:  3  . Estradiol 10 MCG TABS vaginal tablet    Sig: Place 1 tablet (10 mcg total) vaginally 2 (two) times a week.    Dispense:  24 tablet    Refill:  3  . estradiol (ESTRACE) 0.1 MG/GM vaginal cream    Sig: 0.5 g intravaginally 1-3 times per week. Short course before starting tablets back    Dispense:  42.5 g    Refill:  0   I,Harris Phan,acting as a scribe for Garret Reddish, MD.,have documented all relevant documentation on the behalf of Garret Reddish, MD,as directed by  Garret Reddish, MD while in the presence of Garret Reddish, MD.   I, Garret Reddish, MD, have reviewed all documentation for this visit. The documentation on 12/31/20 for the exam, diagnosis, procedures, and orders are all accurate and complete.   Return precautions advised.  Garret Reddish, MD  Addendum:  Patient message "Dr. Yong Channel,   Hello, I saw my Rheumatologist and he recommended that I have you order an MRI Lumbar Spine for chronic bilateral low back pain,does radiate into sciatica area both sides. He said to see what is going on with my back. He does not feel it is just RA.   Thank you,  Laurisa " -Patient had been given a copy of her MRI from 04/02/2020 with Dr. Tomi Likens and with ongoing/mildly worsening symptoms and Dr. Teodoro Spray recommendation-I will order MRI at this time  Garret Reddish, MD

## 2021-01-01 DIAGNOSIS — M0579 Rheumatoid arthritis with rheumatoid factor of multiple sites without organ or systems involvement: Secondary | ICD-10-CM | POA: Diagnosis not present

## 2021-01-01 DIAGNOSIS — Z79899 Other long term (current) drug therapy: Secondary | ICD-10-CM | POA: Diagnosis not present

## 2021-01-01 DIAGNOSIS — M1990 Unspecified osteoarthritis, unspecified site: Secondary | ICD-10-CM | POA: Diagnosis not present

## 2021-01-01 DIAGNOSIS — M549 Dorsalgia, unspecified: Secondary | ICD-10-CM | POA: Diagnosis not present

## 2021-01-01 DIAGNOSIS — M797 Fibromyalgia: Secondary | ICD-10-CM | POA: Diagnosis not present

## 2021-01-01 DIAGNOSIS — R700 Elevated erythrocyte sedimentation rate: Secondary | ICD-10-CM | POA: Diagnosis not present

## 2021-01-01 LAB — LIPID PANEL
Cholesterol: 222 mg/dL — ABNORMAL HIGH (ref 0–200)
HDL: 81.5 mg/dL (ref >39.00)
LDL Cholesterol: 113 mg/dL — ABNORMAL HIGH (ref 0–99)
NonHDL: 140.44
Total CHOL/HDL Ratio: 3
Triglycerides: 135 mg/dL (ref 0.0–149.0)
VLDL: 27 mg/dL (ref 0.0–40.0)

## 2021-01-01 LAB — COMPREHENSIVE METABOLIC PANEL
ALT: 18 U/L (ref 0–35)
AST: 20 U/L (ref 0–37)
Albumin: 4.1 g/dL (ref 3.5–5.2)
Alkaline Phosphatase: 86 U/L (ref 39–117)
BUN: 19 mg/dL (ref 6–23)
CO2: 27 mEq/L (ref 19–32)
Calcium: 9.6 mg/dL (ref 8.4–10.5)
Chloride: 96 mEq/L (ref 96–112)
Creatinine, Ser: 0.81 mg/dL (ref 0.40–1.20)
GFR: 80.61 mL/min (ref >60.00)
Glucose, Bld: 86 mg/dL (ref 70–99)
Potassium: 4.7 mEq/L (ref 3.5–5.1)
Sodium: 138 mEq/L (ref 135–145)
Total Bilirubin: 0.4 mg/dL (ref 0.2–1.2)
Total Protein: 7.6 g/dL (ref 6.0–8.3)

## 2021-01-04 ENCOUNTER — Other Ambulatory Visit (HOSPITAL_COMMUNITY): Payer: Self-pay

## 2021-01-04 MED ORDER — PROAIR RESPICLICK 108 (90 BASE) MCG/ACT IN AEPB
1.0000 | INHALATION_SPRAY | Freq: Four times a day (QID) | RESPIRATORY_TRACT | 3 refills | Status: DC | PRN
  Filled 2021-01-04: qty 1, 50d supply, fill #0

## 2021-01-04 NOTE — Addendum Note (Signed)
Addended by: Marin Olp on: 01/04/2021 08:00 PM   Modules accepted: Orders

## 2021-01-10 ENCOUNTER — Other Ambulatory Visit: Payer: Self-pay

## 2021-01-10 ENCOUNTER — Ambulatory Visit
Admission: RE | Admit: 2021-01-10 | Discharge: 2021-01-10 | Disposition: A | Discharge status: Home / Self Care | Payer: 59 | Source: Ambulatory Visit | Attending: Family Medicine | Admitting: Family Medicine

## 2021-01-10 DIAGNOSIS — G8929 Other chronic pain: Secondary | ICD-10-CM

## 2021-01-10 DIAGNOSIS — M5442 Lumbago with sciatica, left side: Secondary | ICD-10-CM

## 2021-01-10 DIAGNOSIS — M5126 Other intervertebral disc displacement, lumbar region: Secondary | ICD-10-CM | POA: Diagnosis not present

## 2021-01-11 ENCOUNTER — Telehealth: Payer: Self-pay | Admitting: *Deleted

## 2021-01-11 DIAGNOSIS — M5416 Radiculopathy, lumbar region: Secondary | ICD-10-CM

## 2021-01-11 NOTE — Telephone Encounter (Signed)
Patient requesting MRI results and advise

## 2021-01-11 NOTE — Telephone Encounter (Signed)
Discussed results directly with patient. HAs evidence of bilateral lumbar nerve impingement. Symptoms currently manageable on ibuprofen and tramadol. Will refer to neurosurgery.  Algis Greenhouse. Jerline Pain, MD 01/11/2021 8:08 AM

## 2021-01-15 ENCOUNTER — Encounter: Payer: Self-pay | Admitting: Family Medicine

## 2021-01-18 ENCOUNTER — Other Ambulatory Visit: Payer: Self-pay | Admitting: *Deleted

## 2021-01-18 ENCOUNTER — Ambulatory Visit: Payer: 59 | Admitting: Family Medicine

## 2021-01-18 ENCOUNTER — Other Ambulatory Visit (HOSPITAL_COMMUNITY): Payer: Self-pay

## 2021-01-18 ENCOUNTER — Other Ambulatory Visit: Payer: Self-pay | Admitting: Family Medicine

## 2021-01-18 ENCOUNTER — Telehealth: Payer: Self-pay | Admitting: Family Medicine

## 2021-01-18 DIAGNOSIS — M545 Low back pain, unspecified: Secondary | ICD-10-CM

## 2021-01-18 DIAGNOSIS — G8929 Other chronic pain: Secondary | ICD-10-CM

## 2021-01-18 MED ORDER — TRAMADOL HCL 50 MG PO TABS
ORAL_TABLET | ORAL | 1 refills | Status: DC
  Filled 2021-01-18: qty 180, 23d supply, fill #0

## 2021-01-18 NOTE — Telephone Encounter (Signed)
LAST APPOINTMENT DATE: 01/11/2021   NEXT APPOINTMENT DATE: Visit date not found    LAST REFILL:  12/26/2019

## 2021-01-18 NOTE — Telephone Encounter (Signed)
I called [facility] and reviewed the MRI with her.  Based on her dominant pain pattern of left-sided low back pain with some right-sided low back pain we will proceed with bilateral facet injections at L4-5.  She does have some left anterior thigh pain and bilateral lateral hip pain.  We could consider epidural steroid injections in the future if needed.  Additionally she has been referred to neurosurgery pain management for potential injections however she is not heard back yet.  Recommend getting the facet injections and then following up with PM&R doctor in the future.

## 2021-01-19 ENCOUNTER — Other Ambulatory Visit (HOSPITAL_COMMUNITY): Payer: Self-pay

## 2021-01-19 ENCOUNTER — Ambulatory Visit
Admission: RE | Admit: 2021-01-19 | Discharge: 2021-01-19 | Disposition: A | Discharge status: Home / Self Care | Payer: 59 | Source: Ambulatory Visit | Attending: Family Medicine | Admitting: Family Medicine

## 2021-01-19 ENCOUNTER — Other Ambulatory Visit: Payer: Self-pay

## 2021-01-19 DIAGNOSIS — G8929 Other chronic pain: Secondary | ICD-10-CM

## 2021-01-19 DIAGNOSIS — M47817 Spondylosis without myelopathy or radiculopathy, lumbosacral region: Secondary | ICD-10-CM | POA: Diagnosis not present

## 2021-01-19 DIAGNOSIS — M545 Low back pain, unspecified: Secondary | ICD-10-CM

## 2021-01-19 MED ORDER — METHYLPREDNISOLONE ACETATE 40 MG/ML INJ SUSP (RADIOLOG
80.0000 mg | Freq: Once | INTRAMUSCULAR | Status: AC
  Administered 2021-01-19: 80 mg via INTRA_ARTICULAR

## 2021-01-19 MED ORDER — IOPAMIDOL (ISOVUE-M 200) INJECTION 41%
1.0000 mL | Freq: Once | INTRAMUSCULAR | Status: AC
  Administered 2021-01-19: 1 mL via INTRA_ARTICULAR

## 2021-01-19 NOTE — Discharge Instructions (Signed)

## 2021-01-22 DIAGNOSIS — M0579 Rheumatoid arthritis with rheumatoid factor of multiple sites without organ or systems involvement: Secondary | ICD-10-CM | POA: Diagnosis not present

## 2021-01-25 ENCOUNTER — Other Ambulatory Visit (HOSPITAL_COMMUNITY): Payer: Self-pay

## 2021-01-28 ENCOUNTER — Other Ambulatory Visit: Payer: Self-pay

## 2021-01-28 ENCOUNTER — Ambulatory Visit
Admission: RE | Admit: 2021-01-28 | Discharge: 2021-01-28 | Disposition: A | Discharge status: Home / Self Care | Payer: 59 | Source: Ambulatory Visit | Attending: Family Medicine | Admitting: Family Medicine

## 2021-01-28 DIAGNOSIS — Z1231 Encounter for screening mammogram for malignant neoplasm of breast: Secondary | ICD-10-CM

## 2021-02-11 ENCOUNTER — Other Ambulatory Visit (HOSPITAL_COMMUNITY): Payer: Self-pay

## 2021-02-17 ENCOUNTER — Other Ambulatory Visit: Payer: Self-pay | Admitting: *Deleted

## 2021-02-17 DIAGNOSIS — Z1211 Encounter for screening for malignant neoplasm of colon: Secondary | ICD-10-CM

## 2021-02-19 DIAGNOSIS — M0579 Rheumatoid arthritis with rheumatoid factor of multiple sites without organ or systems involvement: Secondary | ICD-10-CM | POA: Diagnosis not present

## 2021-02-19 LAB — HEPATIC FUNCTION PANEL
ALT: 22 (ref 7–35)
AST: 14 (ref 13–35)
Alkaline Phosphatase: 95 (ref 25–125)

## 2021-02-19 LAB — CBC AND DIFFERENTIAL
HCT: 40 (ref 36–46)
Hemoglobin: 12.1 (ref 12.0–16.0)
Neutrophils Absolute: 4.2
Platelets: 230 (ref 150–399)
WBC: 6.6

## 2021-02-19 LAB — BASIC METABOLIC PANEL
BUN: 18 (ref 4–21)
CO2: 28 — AB (ref 13–22)
Chloride: 99 (ref 99–108)
Creatinine: 0.8 (ref 0.5–1.1)
Glucose: 106
Potassium: 4.2 (ref 3.4–5.3)
Sodium: 139 (ref 137–147)

## 2021-02-19 LAB — COMPREHENSIVE METABOLIC PANEL
Albumin: 3.8 (ref 3.5–5.0)
Calcium: 9.6 (ref 8.7–10.7)

## 2021-02-19 LAB — CBC: RBC: 4.33 (ref 3.87–5.11)

## 2021-03-01 ENCOUNTER — Other Ambulatory Visit (HOSPITAL_COMMUNITY): Payer: Self-pay

## 2021-03-01 DIAGNOSIS — M549 Dorsalgia, unspecified: Secondary | ICD-10-CM | POA: Diagnosis not present

## 2021-03-01 DIAGNOSIS — M797 Fibromyalgia: Secondary | ICD-10-CM | POA: Diagnosis not present

## 2021-03-01 DIAGNOSIS — R700 Elevated erythrocyte sedimentation rate: Secondary | ICD-10-CM | POA: Diagnosis not present

## 2021-03-01 DIAGNOSIS — M7989 Other specified soft tissue disorders: Secondary | ICD-10-CM | POA: Diagnosis not present

## 2021-03-01 DIAGNOSIS — M1990 Unspecified osteoarthritis, unspecified site: Secondary | ICD-10-CM | POA: Diagnosis not present

## 2021-03-01 DIAGNOSIS — M0579 Rheumatoid arthritis with rheumatoid factor of multiple sites without organ or systems involvement: Secondary | ICD-10-CM | POA: Diagnosis not present

## 2021-03-01 DIAGNOSIS — Z79899 Other long term (current) drug therapy: Secondary | ICD-10-CM | POA: Diagnosis not present

## 2021-03-01 DIAGNOSIS — M79673 Pain in unspecified foot: Secondary | ICD-10-CM | POA: Diagnosis not present

## 2021-03-01 MED ORDER — PREDNISONE 5 MG PO TABS
ORAL_TABLET | ORAL | 0 refills | Status: DC
  Filled 2021-03-01: qty 42, 28d supply, fill #0

## 2021-03-01 MED ORDER — LEFLUNOMIDE 20 MG PO TABS
20.0000 mg | ORAL_TABLET | Freq: Every day | ORAL | 3 refills | Status: DC
  Filled 2021-03-01: qty 30, 30d supply, fill #0
  Filled 2021-03-27: qty 30, 30d supply, fill #1
  Filled 2021-04-28: qty 30, 30d supply, fill #2
  Filled 2021-05-28: qty 30, 30d supply, fill #3

## 2021-03-05 ENCOUNTER — Encounter: Payer: Self-pay | Admitting: Family Medicine

## 2021-03-13 ENCOUNTER — Other Ambulatory Visit (HOSPITAL_COMMUNITY): Payer: Self-pay

## 2021-03-13 MED ORDER — CARESTART COVID-19 HOME TEST VI KIT
PACK | 0 refills | Status: DC
  Filled 2021-03-13: qty 2, 4d supply, fill #0

## 2021-03-24 ENCOUNTER — Encounter: Payer: Self-pay | Admitting: Internal Medicine

## 2021-03-25 ENCOUNTER — Ambulatory Visit: Payer: 59 | Admitting: Family Medicine

## 2021-03-25 DIAGNOSIS — M0579 Rheumatoid arthritis with rheumatoid factor of multiple sites without organ or systems involvement: Secondary | ICD-10-CM | POA: Diagnosis not present

## 2021-03-26 NOTE — Progress Notes (Signed)
I, [name], LAT, ATC, am serving as scribe for Dr. Lynne Leader.  Nancy [name] is a 55 y.o. female who presents to [place] at [facility] today for f/u of chronic B knee pain.  She was last seen by Dr. Georgina Snell on 11/12/20 for f/u of R knee pain, R hip and LBP and had B SIJ and R knee injections.  She had a series of B knee Orthovisc injections w/ her last one being on 07/09/20.  She has been diagnosed w/ both RA and fibromyalgia.  She takes prednisone, Gabapentin and Tramadol.  Today, pt reports that her B knee pain has flared up over the past 2 weeks to one month.  The R knee pain is worse than the L.  She denies any new knee swelling.    Diagnostic imaging: L-spine MRI- 01/10/21; R hip and R knee XR- 11/12/20  Pertinent review of systems: No fevers or chills  Relevant historical information: Rheumatoid arthritis.  Doing well with new regimen   Exam:  BP 124/80 (BP Location: Left Arm, Patient Position: Sitting, Cuff Size: Large)   Pulse 68   Ht '5\' 4"'$  (1.626 m)   Wt 249 lb (112.9 kg)   SpO2 98%   BMI 42.74 kg/m  General: Well Developed, well nourished, and in no acute distress.   MSK: Right knee mild effusion normal motion with crepitation. Left knee mild effusion normal motion with crepitation.    Lab and Radiology Results  Procedure: Real-time Ultrasound Guided Injection of right knee superior lateral patellar space Device: Philips Affiniti 50G Images permanently stored and available for review in PACS Verbal informed consent obtained.  Discussed risks and benefits of procedure. Warned about infection bleeding damage to structures skin hypopigmentation and fat atrophy among others. Patient expresses understanding and agreement Time-out conducted.   Noted no overlying erythema, induration, or other signs of local infection.   Skin prepped in a sterile fashion.   Local anesthesia: Topical Ethyl chloride.   With sterile technique and under real time  ultrasound guidance: 40 mg of Kenalog and 2 mL of Marcaine injected into knee joint. Fluid seen entering the joint capsule.   Completed without difficulty   Pain immediately resolved suggesting accurate placement of the medication.   Advised to call if fevers/chills, erythema, induration, drainage, or persistent bleeding.   Images permanently stored and available for review in the ultrasound unit.  Impression: Technically successful ultrasound guided injection.    Procedure: Real-time Ultrasound Guided Injection of left knee superior lateral patellar space Device: Philips Affiniti 50G Images permanently stored and available for review in PACS Verbal informed consent obtained.  Discussed risks and benefits of procedure. Warned about infection bleeding damage to structures skin hypopigmentation and fat atrophy among others. Patient expresses understanding and agreement Time-out conducted.   Noted no overlying erythema, induration, or other signs of local infection.   Skin prepped in a sterile fashion.   Local anesthesia: Topical Ethyl chloride.   With sterile technique and under real time ultrasound guidance: 40 mg of Kenalog and 2 mL of Marcaine injected into knee joint. Fluid seen entering the joint capsule.   Completed without difficulty   Pain immediately resolved suggesting accurate placement of the medication.   Advised to call if fevers/chills, erythema, induration, drainage, or persistent bleeding.   Images permanently stored and available for review in the ultrasound unit.  Impression: Technically successful ultrasound guided injection.    EXAM: RIGHT KNEE 3 VIEWS   COMPARISON:  None.   FINDINGS: Soft tissues are unremarkable. There is mild spurring of the medial, lateral, and patellofemoral compartments. No fracture or dislocation.   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     Electronically Signed   By: Miachel Roux M.D.   On: 11/13/2020  13:28       Assessment and Plan: 55 y.o. female with bilateral knee pain predominantly due to DJD.  Her rheumatoid arthritis probably plays a factor as well.  She is done well since the last visit indicating that better control of rheumatoid arthritis has been helpful.  Plan for bilateral steroid injections today.  Continue conservative management strategies and quad strengthening and weight management.  Recheck back as needed.  Can proceed with steroid injections every 3 months as needed.  Hyaluronic acid injections in the past have been somewhat mediocre.   PDMP not reviewed this encounter. Orders Placed This Encounter  Procedures   [place] LIMITED JOINT SPACE STRUCTURES LOW BILAT(NO LINKED CHARGES)    Order Specific Question:   Reason for Exam (SYMPTOM  OR DIAGNOSIS REQUIRED)    Answer:   B knee pain    Order Specific Question:   Preferred imaging location?    Answer:   [facility]   No orders of the defined types were placed in this encounter.    Discussed warning signs or symptoms. Please see discharge instructions. Patient expresses understanding.   The above documentation has been reviewed and is accurate and complete Lynne Leader, M.D.

## 2021-03-27 ENCOUNTER — Other Ambulatory Visit (HOSPITAL_COMMUNITY): Payer: Self-pay

## 2021-03-27 MED FILL — Tizanidine HCl Tab 4 MG (Base Equivalent): ORAL | 10 days supply | Qty: 30 | Fill #0 | Status: AC

## 2021-03-29 ENCOUNTER — Ambulatory Visit: Payer: Self-pay

## 2021-03-29 ENCOUNTER — Encounter: Payer: Self-pay | Admitting: Family Medicine

## 2021-03-29 ENCOUNTER — Ambulatory Visit: Payer: 59 | Admitting: Family Medicine

## 2021-03-29 ENCOUNTER — Other Ambulatory Visit: Payer: Self-pay

## 2021-03-29 VITALS — BP 124/80 | HR 68 | Ht 64.0 in | Wt 249.0 lb

## 2021-03-29 DIAGNOSIS — M170 Bilateral primary osteoarthritis of knee: Secondary | ICD-10-CM

## 2021-03-29 DIAGNOSIS — M25561 Pain in right knee: Secondary | ICD-10-CM | POA: Diagnosis not present

## 2021-03-29 DIAGNOSIS — G8929 Other chronic pain: Secondary | ICD-10-CM | POA: Diagnosis not present

## 2021-03-29 DIAGNOSIS — M25562 Pain in left knee: Secondary | ICD-10-CM

## 2021-03-29 NOTE — Patient Instructions (Signed)
Good to see you today.  You had B knee injections.  Call or go to the ER if you develop a large red swollen joint with extreme pain or oozing puss.   Follow-up as needed.  Can do knee injections again in 3 months if needed.

## 2021-04-02 ENCOUNTER — Other Ambulatory Visit (HOSPITAL_BASED_OUTPATIENT_CLINIC_OR_DEPARTMENT_OTHER): Payer: Self-pay

## 2021-04-06 ENCOUNTER — Other Ambulatory Visit: Payer: Self-pay | Admitting: Physician Assistant

## 2021-04-06 ENCOUNTER — Other Ambulatory Visit (INDEPENDENT_AMBULATORY_CARE_PROVIDER_SITE_OTHER): Payer: 59

## 2021-04-06 DIAGNOSIS — M0579 Rheumatoid arthritis with rheumatoid factor of multiple sites without organ or systems involvement: Secondary | ICD-10-CM | POA: Diagnosis not present

## 2021-04-06 LAB — CBC WITH DIFFERENTIAL/PLATELET
Basophils Absolute: 0 10*3/uL (ref 0.0–0.1)
Basophils Relative: 0.3 % (ref 0.0–3.0)
Eosinophils Absolute: 0.1 10*3/uL (ref 0.0–0.7)
Eosinophils Relative: 1.2 % (ref 0.0–5.0)
HCT: 41.3 % (ref 36.0–46.0)
Hemoglobin: 13 g/dL (ref 12.0–15.0)
Lymphocytes Relative: 19.9 % (ref 12.0–46.0)
Lymphs Abs: 1.4 10*3/uL (ref 0.7–4.0)
MCHC: 31.5 g/dL (ref 30.0–36.0)
MCV: 85 fl (ref 78.0–100.0)
Monocytes Absolute: 0.7 10*3/uL (ref 0.1–1.0)
Monocytes Relative: 9.5 % (ref 3.0–12.0)
Neutro Abs: 4.8 10*3/uL (ref 1.4–7.7)
Neutrophils Relative %: 69.1 % (ref 43.0–77.0)
Platelets: 218 10*3/uL (ref 150.0–400.0)
RBC: 4.86 Mil/uL (ref 3.87–5.11)
RDW: 15.2 % (ref 11.5–15.5)
WBC: 7 10*3/uL (ref 4.0–10.5)

## 2021-04-06 LAB — COMPREHENSIVE METABOLIC PANEL
ALT: 19 U/L (ref 0–35)
AST: 17 U/L (ref 0–37)
Albumin: 4 g/dL (ref 3.5–5.2)
Alkaline Phosphatase: 91 U/L (ref 39–117)
BUN: 19 mg/dL (ref 6–23)
CO2: 32 mEq/L (ref 19–32)
Calcium: 9.8 mg/dL (ref 8.4–10.5)
Chloride: 97 mEq/L (ref 96–112)
Creatinine, Ser: 0.79 mg/dL (ref 0.40–1.20)
GFR: 82.91 mL/min (ref >60.00)
Glucose, Bld: 84 mg/dL (ref 70–99)
Potassium: 4.4 mEq/L (ref 3.5–5.1)
Sodium: 136 mEq/L (ref 135–145)
Total Bilirubin: 0.5 mg/dL (ref 0.2–1.2)
Total Protein: 7 g/dL (ref 6.0–8.3)

## 2021-04-06 LAB — SEDIMENTATION RATE: Sed Rate: 113 mm/hr — ABNORMAL HIGH (ref 0–30)

## 2021-04-14 DIAGNOSIS — Z6841 Body Mass Index (BMI) 40.0 and over, adult: Secondary | ICD-10-CM | POA: Diagnosis not present

## 2021-04-14 DIAGNOSIS — M47816 Spondylosis without myelopathy or radiculopathy, lumbar region: Secondary | ICD-10-CM | POA: Diagnosis not present

## 2021-04-14 DIAGNOSIS — R030 Elevated blood-pressure reading, without diagnosis of hypertension: Secondary | ICD-10-CM | POA: Diagnosis not present

## 2021-04-22 DIAGNOSIS — M0579 Rheumatoid arthritis with rheumatoid factor of multiple sites without organ or systems involvement: Secondary | ICD-10-CM | POA: Diagnosis not present

## 2021-04-28 ENCOUNTER — Other Ambulatory Visit (HOSPITAL_COMMUNITY): Payer: Self-pay

## 2021-05-06 DIAGNOSIS — M5459 Other low back pain: Secondary | ICD-10-CM | POA: Diagnosis not present

## 2021-05-06 DIAGNOSIS — M47816 Spondylosis without myelopathy or radiculopathy, lumbar region: Secondary | ICD-10-CM | POA: Diagnosis not present

## 2021-05-13 DIAGNOSIS — M5459 Other low back pain: Secondary | ICD-10-CM | POA: Diagnosis not present

## 2021-05-13 DIAGNOSIS — M47816 Spondylosis without myelopathy or radiculopathy, lumbar region: Secondary | ICD-10-CM | POA: Diagnosis not present

## 2021-05-20 ENCOUNTER — Other Ambulatory Visit: Payer: Self-pay

## 2021-05-20 ENCOUNTER — Ambulatory Visit
Admission: RE | Admit: 2021-05-20 | Discharge: 2021-05-20 | Disposition: A | Discharge status: Home / Self Care | Payer: 59 | Source: Ambulatory Visit | Attending: Family Medicine | Admitting: Family Medicine

## 2021-05-20 DIAGNOSIS — Z780 Asymptomatic menopausal state: Secondary | ICD-10-CM | POA: Diagnosis not present

## 2021-05-20 DIAGNOSIS — M85851 Other specified disorders of bone density and structure, right thigh: Secondary | ICD-10-CM | POA: Diagnosis not present

## 2021-05-20 DIAGNOSIS — M0579 Rheumatoid arthritis with rheumatoid factor of multiple sites without organ or systems involvement: Secondary | ICD-10-CM | POA: Diagnosis not present

## 2021-05-25 ENCOUNTER — Other Ambulatory Visit: Payer: Self-pay | Admitting: Physician Assistant

## 2021-05-25 ENCOUNTER — Other Ambulatory Visit (HOSPITAL_BASED_OUTPATIENT_CLINIC_OR_DEPARTMENT_OTHER): Payer: Self-pay

## 2021-05-25 MED ORDER — VALACYCLOVIR HCL 1 G PO TABS
1000.0000 mg | ORAL_TABLET | Freq: Two times a day (BID) | ORAL | 0 refills | Status: DC
  Filled 2021-05-25 – 2022-01-11 (×2): qty 30, 15d supply, fill #0

## 2021-05-28 ENCOUNTER — Other Ambulatory Visit (HOSPITAL_COMMUNITY): Payer: Self-pay

## 2021-06-07 ENCOUNTER — Other Ambulatory Visit (HOSPITAL_BASED_OUTPATIENT_CLINIC_OR_DEPARTMENT_OTHER): Payer: Self-pay

## 2021-06-10 DIAGNOSIS — M1990 Unspecified osteoarthritis, unspecified site: Secondary | ICD-10-CM | POA: Diagnosis not present

## 2021-06-10 DIAGNOSIS — M79673 Pain in unspecified foot: Secondary | ICD-10-CM | POA: Diagnosis not present

## 2021-06-10 DIAGNOSIS — M797 Fibromyalgia: Secondary | ICD-10-CM | POA: Diagnosis not present

## 2021-06-10 DIAGNOSIS — Z79899 Other long term (current) drug therapy: Secondary | ICD-10-CM | POA: Diagnosis not present

## 2021-06-10 DIAGNOSIS — M549 Dorsalgia, unspecified: Secondary | ICD-10-CM | POA: Diagnosis not present

## 2021-06-10 DIAGNOSIS — R700 Elevated erythrocyte sedimentation rate: Secondary | ICD-10-CM | POA: Diagnosis not present

## 2021-06-10 DIAGNOSIS — M0579 Rheumatoid arthritis with rheumatoid factor of multiple sites without organ or systems involvement: Secondary | ICD-10-CM | POA: Diagnosis not present

## 2021-06-17 DIAGNOSIS — M0579 Rheumatoid arthritis with rheumatoid factor of multiple sites without organ or systems involvement: Secondary | ICD-10-CM | POA: Diagnosis not present

## 2021-06-22 ENCOUNTER — Other Ambulatory Visit (HOSPITAL_COMMUNITY): Payer: Self-pay

## 2021-06-22 MED ORDER — LEFLUNOMIDE 20 MG PO TABS
20.0000 mg | ORAL_TABLET | Freq: Every day | ORAL | 3 refills | Status: DC
  Filled 2021-06-22: qty 30, 30d supply, fill #0
  Filled 2021-07-29: qty 30, 30d supply, fill #1
  Filled 2021-08-30: qty 30, 30d supply, fill #2
  Filled 2021-09-27: qty 30, 30d supply, fill #3

## 2021-06-22 NOTE — Progress Notes (Signed)
Nancy [name] is a 55 y.o. female who presents to [place] at [facility] today for continued chronic bilat knee pain. Pt was last seen by Dr. Georgina Snell on 03/29/21 and was given bilat knee steroid injections and was advised to continue working on quad strengthening and weight management. Today, pt reports her knee pain has returned recently.  She would like to proceed with repeat steroid injections if possible.  She notes her rheumatoid arthritis is better controlled currently.  Dx imaging: 11/12/20 R knee XR  Pertinent review of systems: No fevers or chills  Relevant historical information: Rheumatoid arthritis.  Obesity.   Exam:  Heart rate 80 General: Well Developed, well nourished, and in no acute distress.   MSK: Right knee mild effusion normal motion.  No erythema Left knee mild effusion normal motion.  No erythema    Lab and Radiology Results  Procedure: Real-time Ultrasound Guided Injection of right knee superior lateral patellar space Device: Philips Affiniti 50G Images permanently stored and available for review in PACS Verbal informed consent obtained.  Discussed risks and benefits of procedure. Warned about infection bleeding damage to structures skin hypopigmentation and fat atrophy among others. Patient expresses understanding and agreement Time-out conducted.   Noted no overlying erythema, induration, or other signs of local infection.   Skin prepped in a sterile fashion.   Local anesthesia: Topical Ethyl chloride.   With sterile technique and under real time ultrasound guidance: 40 mg of Kenalog and 2 mL of Marcaine injected into knee joint. Fluid seen entering the joint capsule.   Completed without difficulty   Pain immediately resolved suggesting accurate placement of the medication.   Advised to call if fevers/chills, erythema, induration, drainage, or persistent bleeding.   Images permanently stored and available for review in the  ultrasound unit.  Impression: Technically successful ultrasound guided injection.    Procedure: Real-time Ultrasound Guided Injection of left knee superior lateral patellar space Device: Philips Affiniti 50G Images permanently stored and available for review in PACS Verbal informed consent obtained.  Discussed risks and benefits of procedure. Warned about infection bleeding damage to structures skin hypopigmentation and fat atrophy among others. Patient expresses understanding and agreement Time-out conducted.   Noted no overlying erythema, induration, or other signs of local infection.   Skin prepped in a sterile fashion.   Local anesthesia: Topical Ethyl chloride.   With sterile technique and under real time ultrasound guidance: 40 mg of Kenalog and 2 mL of Marcaine injected into knee joint. Fluid seen entering the joint capsule.   Completed without difficulty   Pain immediately resolved suggesting accurate placement of the medication.   Advised to call if fevers/chills, erythema, induration, drainage, or persistent bleeding.   Images permanently stored and available for review in the ultrasound unit.  Impression: Technically successful ultrasound guided injection.         Assessment and Plan: 55 y.o. 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  Plan for steroid injection bilateral knees.  Can repeat this every 3 months.  Return as needed.  Continue conservative management strategies including quad strengthening and weight loss as well.   PDMP not reviewed this encounter. Orders Placed This Encounter  Procedures   [place] LIMITED JOINT SPACE STRUCTURES LOW BILAT(NO LINKED CHARGES)    Standing Status:   Future    Number of Occurrences:   1    Standing Expiration Date:   12/20/2021    Order Specific  Question:   Reason for Exam (SYMPTOM  OR DIAGNOSIS REQUIRED)    Answer:   bilateral knee pain    Order Specific Question:   Preferred  imaging location?    Answer:   [place]   No orders of the defined types were placed in this encounter.    Discussed warning signs or symptoms. Please see discharge instructions. Patient expresses understanding.   The above documentation has been reviewed and is accurate and complete Lynne Leader, M.D.

## 2021-06-23 ENCOUNTER — Other Ambulatory Visit: Payer: Self-pay

## 2021-06-23 ENCOUNTER — Ambulatory Visit: Payer: Self-pay

## 2021-06-23 ENCOUNTER — Ambulatory Visit: Payer: 59 | Admitting: Family Medicine

## 2021-06-23 DIAGNOSIS — G8929 Other chronic pain: Secondary | ICD-10-CM

## 2021-06-23 DIAGNOSIS — M25562 Pain in left knee: Secondary | ICD-10-CM

## 2021-06-23 DIAGNOSIS — M25561 Pain in right knee: Secondary | ICD-10-CM | POA: Diagnosis not present

## 2021-06-23 DIAGNOSIS — M170 Bilateral primary osteoarthritis of knee: Secondary | ICD-10-CM | POA: Diagnosis not present

## 2021-06-23 NOTE — Patient Instructions (Addendum)
See me as needed 

## 2021-07-15 DIAGNOSIS — M0579 Rheumatoid arthritis with rheumatoid factor of multiple sites without organ or systems involvement: Secondary | ICD-10-CM | POA: Diagnosis not present

## 2021-07-20 ENCOUNTER — Telehealth: Payer: Self-pay

## 2021-07-20 NOTE — Telephone Encounter (Signed)
[employer] forms have been received to be completed.    I have placed in Dr. Ansel Bong box.

## 2021-07-21 NOTE — Telephone Encounter (Signed)
I spoke with patient. Having monthly infusions for rheumatoid arthritis. She certainly needs FMLA for this- I updated paperwork- she has no other concerns so we will cancel appointment for tomorrow. Prior FMLA completed by rheumatology

## 2021-07-22 ENCOUNTER — Ambulatory Visit: Payer: 59 | Admitting: Family Medicine

## 2021-07-29 ENCOUNTER — Other Ambulatory Visit (HOSPITAL_COMMUNITY): Payer: Self-pay

## 2021-08-03 ENCOUNTER — Other Ambulatory Visit: Payer: Self-pay | Admitting: Physician Assistant

## 2021-08-03 ENCOUNTER — Other Ambulatory Visit (HOSPITAL_COMMUNITY): Payer: Self-pay

## 2021-08-03 MED ORDER — PREDNISONE 10 MG PO TABS
ORAL_TABLET | Freq: Every day | ORAL | 1 refills | Status: AC | PRN
  Filled 2021-08-03: qty 30, 30d supply, fill #0

## 2021-08-04 ENCOUNTER — Other Ambulatory Visit (HOSPITAL_COMMUNITY): Payer: Self-pay

## 2021-08-09 ENCOUNTER — Other Ambulatory Visit: Payer: Self-pay

## 2021-08-09 ENCOUNTER — Encounter: Payer: Self-pay | Admitting: Physician Assistant

## 2021-08-09 ENCOUNTER — Ambulatory Visit: Payer: 59 | Admitting: Physician Assistant

## 2021-08-09 VITALS — BP 120/70 | HR 74 | Temp 97.2°F | Wt 238.0 lb

## 2021-08-09 DIAGNOSIS — L0291 Cutaneous abscess, unspecified: Secondary | ICD-10-CM | POA: Diagnosis not present

## 2021-08-09 NOTE — Progress Notes (Signed)
Nancy [name] is a 56 y.o. female here for lymphadenopathy.  History of Present Illness:   Chief Complaint  Patient presents with   Lymph Node    Right groin, noticed yesterday. Tender, not painful    HPI  Nancy [name] presents with concerns due to enlarged Nancy in R groin. Noticed yesterday when showering. Painful to touch.  Denies fevers, chills, malaise, dysuria, vaginal discharge, concerns for STDs. Recently had URI and completed zpack and prednisone on.    Has tried warm compresses without relief.  Past Medical History:  Diagnosis Date   Anemia    Asthma    Bradycardia    Chicken pox    Dysrhythmia    occ. palpitations   Fibromyalgia    Headache(784.0)    Migraines    Neck pain    bulging disk   Polyarthralgia 02/21/2019   PONV (postoperative nausea and vomiting)    sometimes wakes up after surgery and cannot breath due to asthma, can be bradycardic   Rheumatoid arthritis involving multiple sites with positive rheumatoid factor ([employer]) 02/21/2019   Shortness of breath    with bradycardia     Social History   Tobacco Use   Smoking status: Never   Smokeless tobacco: Never  Vaping Use   Vaping Use: Never used  Substance Use Topics   Alcohol use: Yes    Alcohol/week: 0.0 standard drinks    Comment: occasional; just on special occasion   Drug use: No    Past Surgical History:  Procedure Laterality Date   ABDOMINAL HYSTERECTOMY     BACK SURGERY     cervical fusion C5,6,7 with a bulge at [place]  July 10th 2013   [place] N/A 07/31/2018   Procedure: CYSTOSCOPY;  Surgeon: Bjorn Loser, MD;  Location: [facility];  Service: Urology;  Laterality: N/A;   DIAGNOSTIC LAPAROSCOPY     EXCISION OF MESH N/A 07/31/2018   Procedure: REMOVAL OF VAGINAL MESH;  Surgeon: Bjorn Loser, MD;  Location: [facility];  Service: Urology;  Laterality: N/A;   LAPAROSCOPY Bilateral 05/31/2013   Procedure: LAPAROSCOPY  withbilateral salpingo-oophorectomy, lysis of adhesions;  Surgeon: Allena Katz, MD;  Location: [facility];  Service: Gynecology;  Laterality: Bilateral;  with clippings of Vaginal  mesh   SALPINGOOPHORECTOMY Bilateral 05/31/2013   Procedure: BILATERAL SALPINGO OOPHORECTOMY/TRIM MESH;  Surgeon: Allena Katz, MD;  Location: [facility];  Service: Gynecology;  Laterality: Bilateral;   [place] or [place]  2005    Family History  Problem Relation Age of Onset   Hyperlipidemia Mother        15 in 2019   Arthritis Mother    Osteoporosis Mother    Lumbar disc disease Mother        herniated discs.    COPD Mother    Hypertension Father        doesnt know his history well- no recent contact   Heart disease Maternal Grandmother    Diabetes Maternal Grandmother    Cervical cancer Maternal Grandmother    Heart disease Maternal Grandfather    Cancer - Colon Maternal Grandfather    Diabetes Paternal Grandmother    Lung cancer Paternal Grandmother        dad's parents stepped in to help when dad not present  Other Sister        doesnt know history well   Lumbar disc disease Brother        grew up with him    Atrial fibrillation Paternal Grandfather        required a few shocks   Bradycardia Paternal Grandfather    Hypertension Paternal Grandfather    Other Brother        doesnt know history well    Allergies  Allergen Reactions   Advair Diskus [Fluticasone-Salmeterol] Cough    excessive   Erythromycin Diarrhea and Nausea And Vomiting   Percocet [Oxycodone-Acetaminophen] Other (See Comments)    hallucinations    Current Medications:   Current Outpatient Medications:    Abatacept (ORENCIA IV), Inject into the vein., Disp: , Rfl:    Cholecalciferol (VITAMIN D3) 125 MCG (5000 UT) CAPS, Take by mouth daily., Disp: , Rfl:    COVID-19 At Home Antigen Test [facility] COVID-19 HOME TEST) KIT, Use as directed  within package instructions., Disp: 2 kit, Rfl: 0   cyanocobalamin 1000 MCG tablet, Take 1,000 mcg by mouth daily., Disp: , Rfl:    estradiol (ESTRACE) 0.1 MG/GM vaginal cream, Insert 1/2 g intravaginally 1 to 3 times per week. Short course before starting tablets back, Disp: 42.5 g, Rfl: 0   Estradiol 10 MCG TABS vaginal tablet, Place 1 tablet (10 mcg total) vaginally 2 (two) times a week., Disp: 24 tablet, Rfl: 3   folic acid (FOLVITE) 1 MG tablet, TAKE 1 TABLET BY MOUTH DAILY, Disp: 90 tablet, Rfl: 3   gabapentin (NEURONTIN) 300 MG capsule, Take 1 capsule by mouth each morning and 1 capsule each afternoon and 2 capsules before bed as directed, Disp: 360 capsule, Rfl: 3   hydrochlorothiazide (HYDRODIURIL) 25 MG tablet, TAKE 1 TABLET BY MOUTH DAILY AS NEEDED (FLUID RETENTION/EDEMA)., Disp: 90 tablet, Rfl: 1   leflunomide (ARAVA) 20 MG tablet, Take 1 tablet by mouth daily., Disp: 30 tablet, Rfl: 3   levocetirizine (XYZAL) 5 MG tablet, Take 1 tablet (5 mg total) by mouth every evening., Disp: 90 tablet, Rfl: 1   melatonin 5 MG TABS, 1 tablet at bedtime as needed with food, Disp: , Rfl:    ondansetron (ZOFRAN) 4 MG tablet, Take 1 tablet (4 mg total) by mouth every 8 (eight) hours as needed for nausea or vomiting., Disp: 20 tablet, Rfl: 0   predniSONE (DELTASONE) 10 MG tablet, TAKE 1 TABLET BY MOUTH DAILY AS NEEDED, Disp: 30 tablet, Rfl: 1   tiZANidine (ZANAFLEX) 4 MG tablet, TAKE 1 TABLET BY MOUTH EVERY 8 HOURS AS NEEDED FOR MUSCLE SPASMS, Disp: 30 tablet, Rfl: 1   traMADol (ULTRAM) 50 MG tablet, Take 1 to 2 tablets by mouth every 4-6 hours as needed for moderate pain (max of 8 per day)., Disp: 180 tablet, Rfl: 1   Turmeric 500 MG CAPS, Take by mouth 2 (two) times daily., Disp: , Rfl:    valACYclovir (VALTREX) 1000 MG tablet, Take 1 tablet (1,000 mg total) by mouth 2 (two) times daily., Disp: 30 tablet, Rfl: 0   vitamin C (ASCORBIC ACID) 500 MG tablet, Take 500 mg by mouth as needed., Disp: , Rfl:     Albuterol Sulfate (PROAIR RESPICLICK) 956 (90 Base) MCG/ACT AEPB, Inhale 1 puff into the lungs every 6 (six) hours as needed. (Patient not taking: Reported on 08/09/2021), Disp: 1 each, Rfl: 3   Review of Systems:   ROS Negative unless otherwise specified per HPI. Vitals:   Vitals:  08/09/21 1558  BP: 120/70  Pulse: 74  Temp: (!) 97.2 F (36.2 C)  TempSrc: Temporal  SpO2: 96%  Weight: 238 lb (108 kg)     Body mass index is 40.85 kg/m.  Physical Exam:   Physical Exam Constitutional:      Appearance: Normal appearance. She is well-developed.  HENT:     Head: Normocephalic and atraumatic.  Eyes:     General: Lids are normal.     Extraocular Movements: Extraocular movements intact.     Conjunctiva/sclera: Conjunctivae normal.  Pulmonary:     Effort: Pulmonary effort is normal.  Musculoskeletal:        General: Normal range of motion.     Cervical back: Normal range of motion and neck supple.  Skin:    General: Skin is warm and dry.     Comments: 1 cm indurated erythematous lesion to R groin  Neurological:     Mental Status: She is alert and oriented to person, place, and time.  Psychiatric:        Attention and Perception: Attention and perception normal.        Mood and Affect: Mood normal.        Behavior: Behavior normal.        Thought Content: Thought content normal.        Judgment: Judgment normal.   Procedure: I&D Consent: Risks and benefits of therapy discussed with patient who voices understanding and agrees with planned care. No barriers to communication or understanding identified.  After obtaining informed consent, the patient's identity, procedure, and site were verified during a pause prior to proceeding with the minor surgical procedure as per universal protocol recommendations. Pt aware of risks not limited to but including infection, bleeding, damage to near by organs.  Meds, vitals, and allergies reviewed.  Indication: suspected abscess Pt  complaints of: erythema, pain, swelling Location: R groin Size: 1 cm  Prep: etoh/betadine Anesthesia: 1%lidocaine with epi Incision made with #11 blade Wound explored, significant purulent discharge expressed  Tolerated well without significant blood loss or obvious complications Sterilized bandage applied to affected area  Assessment and Plan:   Abscess Tolerated procedure well No obvious need for oral abx at this time Aftercare, including wound care, risks of bleeding and infection were discussed. All questions answered.  Return for wound check as needed for further evaluation and management.    I,Havlyn C Ratchford,acting as a Education administrator for [employer], PA.,have documented all relevant documentation on the behalf of Inda Coke, PA,as directed by  Inda Coke, PA while in the presence of Inda Coke, [place].  I, Inda Coke, [place], have reviewed all documentation for this visit. The documentation on 08/09/21 for the exam, diagnosis, procedures, and orders are all accurate and complete.   Inda Coke, PA-C

## 2021-08-19 DIAGNOSIS — M0579 Rheumatoid arthritis with rheumatoid factor of multiple sites without organ or systems involvement: Secondary | ICD-10-CM | POA: Diagnosis not present

## 2021-08-30 ENCOUNTER — Encounter: Payer: Self-pay | Admitting: Family Medicine

## 2021-08-30 ENCOUNTER — Other Ambulatory Visit: Payer: Self-pay

## 2021-08-30 ENCOUNTER — Other Ambulatory Visit (HOSPITAL_COMMUNITY): Payer: Self-pay

## 2021-08-30 ENCOUNTER — Ambulatory Visit: Payer: 59 | Admitting: Family Medicine

## 2021-08-30 ENCOUNTER — Ambulatory Visit (HOSPITAL_COMMUNITY)
Admission: RE | Admit: 2021-08-30 | Discharge: 2021-08-30 | Disposition: A | Discharge status: Home / Self Care | Payer: 59 | Source: Ambulatory Visit | Attending: Family Medicine | Admitting: Family Medicine

## 2021-08-30 ENCOUNTER — Ambulatory Visit (INDEPENDENT_AMBULATORY_CARE_PROVIDER_SITE_OTHER): Payer: 59 | Admitting: Family Medicine

## 2021-08-30 ENCOUNTER — Other Ambulatory Visit (HOSPITAL_BASED_OUTPATIENT_CLINIC_OR_DEPARTMENT_OTHER): Payer: Self-pay

## 2021-08-30 VITALS — BP 120/86 | HR 73 | Temp 97.3°F | Ht 64.0 in | Wt 235.0 lb

## 2021-08-30 DIAGNOSIS — R110 Nausea: Secondary | ICD-10-CM | POA: Diagnosis not present

## 2021-08-30 DIAGNOSIS — R001 Bradycardia, unspecified: Secondary | ICD-10-CM

## 2021-08-30 DIAGNOSIS — R1011 Right upper quadrant pain: Secondary | ICD-10-CM | POA: Diagnosis not present

## 2021-08-30 DIAGNOSIS — M0579 Rheumatoid arthritis with rheumatoid factor of multiple sites without organ or systems involvement: Secondary | ICD-10-CM

## 2021-08-30 DIAGNOSIS — K8020 Calculus of gallbladder without cholecystitis without obstruction: Secondary | ICD-10-CM | POA: Diagnosis not present

## 2021-08-30 DIAGNOSIS — E785 Hyperlipidemia, unspecified: Secondary | ICD-10-CM

## 2021-08-30 DIAGNOSIS — K828 Other specified diseases of gallbladder: Secondary | ICD-10-CM | POA: Diagnosis not present

## 2021-08-30 LAB — CBC WITH DIFFERENTIAL/PLATELET
Basophils Absolute: 0 10*3/uL (ref 0.0–0.1)
Basophils Relative: 0.6 % (ref 0.0–3.0)
Eosinophils Absolute: 0.1 10*3/uL (ref 0.0–0.7)
Eosinophils Relative: 1.5 % (ref 0.0–5.0)
HCT: 41.8 % (ref 36.0–46.0)
Hemoglobin: 13.4 g/dL (ref 12.0–15.0)
Lymphocytes Relative: 23.8 % (ref 12.0–46.0)
Lymphs Abs: 1.6 10*3/uL (ref 0.7–4.0)
MCHC: 32 g/dL (ref 30.0–36.0)
MCV: 83.1 fl (ref 78.0–100.0)
Monocytes Absolute: 0.7 10*3/uL (ref 0.1–1.0)
Monocytes Relative: 10.1 % (ref 3.0–12.0)
Neutro Abs: 4.2 10*3/uL (ref 1.4–7.7)
Neutrophils Relative %: 64 % (ref 43.0–77.0)
Platelets: 217 10*3/uL (ref 150.0–400.0)
RBC: 5.04 Mil/uL (ref 3.87–5.11)
RDW: 14.6 % (ref 11.5–15.5)
WBC: 6.5 10*3/uL (ref 4.0–10.5)

## 2021-08-30 LAB — COMPREHENSIVE METABOLIC PANEL
ALT: 19 U/L (ref 0–35)
AST: 21 U/L (ref 0–37)
Albumin: 4.1 g/dL (ref 3.5–5.2)
Alkaline Phosphatase: 95 U/L (ref 39–117)
BUN: 18 mg/dL (ref 6–23)
CO2: 29 mEq/L (ref 19–32)
Calcium: 9.6 mg/dL (ref 8.4–10.5)
Chloride: 99 mEq/L (ref 96–112)
Creatinine, Ser: 0.8 mg/dL (ref 0.40–1.20)
GFR: 81.44 mL/min (ref >60.00)
Glucose, Bld: 91 mg/dL (ref 70–99)
Potassium: 4.5 mEq/L (ref 3.5–5.1)
Sodium: 138 mEq/L (ref 135–145)
Total Bilirubin: 0.5 mg/dL (ref 0.2–1.2)
Total Protein: 7.1 g/dL (ref 6.0–8.3)

## 2021-08-30 LAB — LIPASE: Lipase: 13 U/L (ref 11.0–59.0)

## 2021-08-30 MED ORDER — ONDANSETRON 4 MG PO TBDP
4.0000 mg | ORAL_TABLET | Freq: Three times a day (TID) | ORAL | 0 refills | Status: AC | PRN
Start: 2021-08-30 — End: ?
  Filled 2021-08-30: qty 20, 7d supply, fill #0

## 2021-08-30 NOTE — Progress Notes (Signed)
Appointment timed moved

## 2021-08-30 NOTE — Patient Instructions (Addendum)
Health Maintenance Due  Topic Date Due   Zoster Vaccines- Shingrix (1 of 2) Never done   COLONOSCOPY (Pts 45-10yrs Insurance coverage will need to be confirmed)  11/26/2020    Recommended follow up: Return for as needed for new, worsening, persistent symptoms.

## 2021-08-30 NOTE — Progress Notes (Signed)
Phone [phone] In person visit   Subjective:   Nancy [name] is a 56 y.o. year old very pleasant female patient who presents for/with See problem oriented charting Chief Complaint  Patient presents with   Follow-up   Abdominal Pain    Pt c/o abd pain since Sat night and has had constipation and diarrhea off and on.    This visit occurred during the SARS-CoV-2 public health emergency.  Safety protocols were in place, including screening questions prior to the visit, additional usage of staff PPE, and extensive cleaning of exam room while observing appropriate contact time as indicated for disinfecting solutions.   Past Medical History-  Patient Active Problem List   Diagnosis Date Noted   Immunosuppressed status ([place]) 12/26/2019    Priority: High   Rheumatoid arthritis involving multiple sites with positive rheumatoid factor ([place]) 02/21/2019    Priority: High   Fibromyalgia 04/12/2018    Priority: High   Hyperlipidemia 12/25/2019    Priority: Medium    Vaginal dryness 04/12/2018    Priority: Medium    Morbid obesity ([place]) 04/12/2018    Priority: Medium    History of colon polyps 04/12/2018    Priority: Medium    Primary osteoarthritis of both knees 12/28/2017    Priority: Medium    Mild intermittent asthma 11/20/2013    Priority: Medium    Bradycardia 07/04/2012    Priority: Medium    Vitamin B12 deficiency 02/14/2019    Priority: Low   Vitamin D deficiency 04/12/2018    Priority: Low   History of total hysterectomy 04/12/2018    Priority: Low   Insomnia 04/12/2018    Priority: Low   Cervicogenic headache 03/26/2014    Priority: Low   Cervical radiculopathy 03/26/2014    Priority: Low   Urge incontinence 12/20/2018   Urinary frequency 12/20/2018    Medications- reviewed and updated Current Outpatient Medications  Medication Sig Dispense Refill   Abatacept (ORENCIA IV) Inject into the vein.     Albuterol Sulfate (PROAIR RESPICLICK) 035 (90 Base)  MCG/ACT AEPB Inhale 1 puff into the lungs every 6 (six) hours as needed. 1 each 3   Cholecalciferol (VITAMIN D3) 125 MCG (5000 UT) CAPS Take by mouth daily.     cyanocobalamin 1000 MCG tablet Take 1,000 mcg by mouth daily.     estradiol (ESTRACE) 0.1 MG/GM vaginal cream Insert 1/2 g intravaginally 1 to 3 times per week. Short course before starting tablets back 42.5 g 0   Estradiol 10 MCG TABS vaginal tablet Place 1 tablet (10 mcg total) vaginally 2 (two) times a week. 24 tablet 3   folic acid (FOLVITE) 1 MG tablet TAKE 1 TABLET BY MOUTH DAILY 90 tablet 3   gabapentin (NEURONTIN) 300 MG capsule Take 1 capsule by mouth each morning and 1 capsule each afternoon and 2 capsules before bed as directed 360 capsule 3   hydrochlorothiazide (HYDRODIURIL) 25 MG tablet TAKE 1 TABLET BY MOUTH DAILY AS NEEDED (FLUID RETENTION/EDEMA). 90 tablet 1   leflunomide (ARAVA) 20 MG tablet Take 1 tablet by mouth daily. 30 tablet 3   levocetirizine (XYZAL) 5 MG tablet Take 1 tablet (5 mg total) by mouth every evening. 90 tablet 1   melatonin 5 MG TABS 1 tablet at bedtime as needed with food     ondansetron (ZOFRAN-ODT) 4 MG disintegrating tablet Take 1 tablet (4 mg total) by mouth every 8 (eight) hours as needed for nausea or vomiting. 20 tablet 0   predniSONE (DELTASONE)  10 MG tablet TAKE 1 TABLET BY MOUTH DAILY AS NEEDED 30 tablet 1   tiZANidine (ZANAFLEX) 4 MG tablet TAKE 1 TABLET BY MOUTH EVERY 8 HOURS AS NEEDED FOR MUSCLE SPASMS 30 tablet 1   traMADol (ULTRAM) 50 MG tablet Take 1 to 2 tablets by mouth every 4-6 hours as needed for moderate pain (max of 8 per day). 180 tablet 1   Turmeric 500 MG CAPS Take by mouth 2 (two) times daily.     valACYclovir (VALTREX) 1000 MG tablet Take 1 tablet (1,000 mg total) by mouth 2 (two) times daily. 30 tablet 0   vitamin C (ASCORBIC ACID) 500 MG tablet Take 500 mg by mouth as needed.     No current facility-administered medications for this visit.     Objective:  BP 120/86     Pulse 73    Temp (!) 97.3 F (36.3 C)    Ht 5\' 4"  (1.626 m)    Wt 235 lb (106.6 kg)    SpO2 94%    BMI 40.34 kg/m  Gen: Laying on the bed-appears uncomfortable as I enter the room CV: RRR no murmurs rubs or gallops Lungs: CTAB no crackles, wheeze, rhonchi Abdomen: soft/nontender and lower abdomen-patient very tender in right upper quadrant and epigastric area-also has pain with even light touching of dermatome around to the back in same distribution//nondistended/normal bowel sounds. No rebound or guarding.  Ext: Trace edema Skin: warm, dry    Assessment and Plan   # RUQ pain S: Patient started with right upper quadrant pain (none in back0 on Saturday night up to 10/10- they went out to dinner at [employer]- had steak with mushrooms baked potato with sour cream and butter and salad. 9 pm felt nauseous and around 10 pm went to bed and pain worsened. Took tums at 7 pm wondering if reflux. By 4 10 was able to fall asleep- about 6 hours of more significant pain. Strongly considered ER visit.  Did not take tramadol on Saturday.  Yesterday nauseous most of the day and felt poorly. Toast and chicken noodle soup and pretzels. Not much pain yesterday.   This morning woke up and felt nauseous and had pain in back but then came around like a band into RUQ- pain level today 6/10 focuse din front. Did not eat this morning. Sipped on zero sprite only. Last drink today around 8 30 Am- she brought this up to me this morning and we ordered CBC, CMP, lipase and ordered RUQ [place] which is scheduled for 2:15 PM at [facility]. Feels worn down more than usual but didn't sleep well Saturday night.  Did take all of her normal tramadol today.  She also has noted constipation and diarrhea off and on for a few weeks. No other abdominal symptoms prior to this . Did have MRI lumbar spine 04/03/20 where cholelithiasis was noted incidentally.  A/P: 56 year old female with history of cholelithiasis presenting with right upper  quadrant pain starting on Saturday after fatty meal with recurrent pain today concerning for biliary colic.  Also will check LFTs to evaluate liver function and lipase for potential pancreatitis.  Symptomatically will cover with Zofran.  We discussed possible need for emergency room visit or CT scan follow-up as well as potential need for surgery. - CBC has already come back and white count not elevated -Pending right upper quadrant ultrasound this afternoon stat -Patient is sensitive over her skin as well and bandlike distribution right upper quadrant around to her back-discussed  possibility of shingles-she will monitor for rash-if this occurs would want to promptly start Valtrex -Please note orders for CBC, CMP, lipase, right upper quadrant ultrasound were linked to orders from earlier today visit at 4 40-when visit was rescheduled to 1140 these labs did not carryover  Recommended follow up: Return for as needed for new, worsening, persistent symptoms. Future Appointments  Date Time Provider [place]  08/30/2021  2:30 PM [facility]    Lab/Order associations:   ICD-10-CM   1. Right upper quadrant pain  R10.11     2. Nausea without vomiting  R11.0       Meds ordered this encounter  Medications   ondansetron (ZOFRAN-ODT) 4 MG disintegrating tablet    Sig: Take 1 tablet (4 mg total) by mouth every 8 (eight) hours as needed for nausea or vomiting.    Dispense:  20 tablet    Refill:  0    Time Spent: 23 minutes of total time (12:35 PM- 12:58 PM) was spent on the date of the encounter performing the following actions: chart review prior to seeing the patient, obtaining history, performing a medically necessary exam, counseling on the treatment plan, placing orders, and documenting in our EHR.    Return precautions advised.  Garret Reddish, MD

## 2021-08-31 ENCOUNTER — Other Ambulatory Visit: Payer: Self-pay

## 2021-08-31 DIAGNOSIS — K8080 Other cholelithiasis without obstruction: Secondary | ICD-10-CM

## 2021-08-31 DIAGNOSIS — R1011 Right upper quadrant pain: Secondary | ICD-10-CM

## 2021-09-06 ENCOUNTER — Other Ambulatory Visit: Payer: Self-pay | Admitting: Surgery

## 2021-09-06 DIAGNOSIS — K8020 Calculus of gallbladder without cholecystitis without obstruction: Secondary | ICD-10-CM | POA: Diagnosis not present

## 2021-09-16 DIAGNOSIS — M0579 Rheumatoid arthritis with rheumatoid factor of multiple sites without organ or systems involvement: Secondary | ICD-10-CM | POA: Diagnosis not present

## 2021-09-23 DIAGNOSIS — M0579 Rheumatoid arthritis with rheumatoid factor of multiple sites without organ or systems involvement: Secondary | ICD-10-CM | POA: Diagnosis not present

## 2021-09-23 DIAGNOSIS — M549 Dorsalgia, unspecified: Secondary | ICD-10-CM | POA: Diagnosis not present

## 2021-09-23 DIAGNOSIS — R700 Elevated erythrocyte sedimentation rate: Secondary | ICD-10-CM | POA: Diagnosis not present

## 2021-09-23 DIAGNOSIS — M79673 Pain in unspecified foot: Secondary | ICD-10-CM | POA: Diagnosis not present

## 2021-09-23 DIAGNOSIS — M1990 Unspecified osteoarthritis, unspecified site: Secondary | ICD-10-CM | POA: Diagnosis not present

## 2021-09-23 DIAGNOSIS — Z79899 Other long term (current) drug therapy: Secondary | ICD-10-CM | POA: Diagnosis not present

## 2021-09-23 DIAGNOSIS — M797 Fibromyalgia: Secondary | ICD-10-CM | POA: Diagnosis not present

## 2021-09-27 ENCOUNTER — Other Ambulatory Visit (HOSPITAL_COMMUNITY): Payer: Self-pay

## 2021-09-29 ENCOUNTER — Other Ambulatory Visit: Payer: Self-pay

## 2021-09-29 ENCOUNTER — Encounter (HOSPITAL_BASED_OUTPATIENT_CLINIC_OR_DEPARTMENT_OTHER): Payer: Self-pay | Admitting: Surgery

## 2021-10-05 ENCOUNTER — Encounter (HOSPITAL_BASED_OUTPATIENT_CLINIC_OR_DEPARTMENT_OTHER)
Admission: RE | Admit: 2021-10-05 | Discharge: 2021-10-05 | Disposition: A | Discharge status: Home / Self Care | Payer: 59 | Source: Ambulatory Visit | Attending: Surgery | Admitting: Surgery

## 2021-10-05 DIAGNOSIS — J45909 Unspecified asthma, uncomplicated: Secondary | ICD-10-CM | POA: Diagnosis not present

## 2021-10-05 DIAGNOSIS — E785 Hyperlipidemia, unspecified: Secondary | ICD-10-CM | POA: Diagnosis not present

## 2021-10-05 DIAGNOSIS — D849 Immunodeficiency, unspecified: Secondary | ICD-10-CM | POA: Diagnosis not present

## 2021-10-05 DIAGNOSIS — Z6841 Body Mass Index (BMI) 40.0 and over, adult: Secondary | ICD-10-CM | POA: Diagnosis not present

## 2021-10-05 DIAGNOSIS — Z79899 Other long term (current) drug therapy: Secondary | ICD-10-CM | POA: Diagnosis not present

## 2021-10-05 DIAGNOSIS — K8010 Calculus of gallbladder with chronic cholecystitis without obstruction: Secondary | ICD-10-CM | POA: Diagnosis not present

## 2021-10-05 DIAGNOSIS — Z01818 Encounter for other preprocedural examination: Secondary | ICD-10-CM | POA: Diagnosis not present

## 2021-10-05 DIAGNOSIS — M797 Fibromyalgia: Secondary | ICD-10-CM | POA: Diagnosis not present

## 2021-10-05 DIAGNOSIS — M069 Rheumatoid arthritis, unspecified: Secondary | ICD-10-CM | POA: Diagnosis not present

## 2021-10-05 LAB — BASIC METABOLIC PANEL
Anion gap: 8 (ref 5–15)
BUN: 19 mg/dL (ref 6–20)
CO2: 30 mmol/L (ref 22–32)
Calcium: 9.3 mg/dL (ref 8.9–10.3)
Chloride: 100 mmol/L (ref 98–111)
Creatinine, Ser: 0.84 mg/dL (ref 0.44–1.00)
GFR, Estimated: >60 mL/min (ref >60)
Glucose, Bld: 58 mg/dL — ABNORMAL LOW (ref 70–99)
Potassium: 4.9 mmol/L (ref 3.5–5.1)
Sodium: 138 mmol/L (ref 135–145)

## 2021-10-05 MED ORDER — ENSURE PRE-SURGERY PO LIQD: 296.0000 mL | Freq: Once | ORAL | Status: DC

## 2021-10-05 NOTE — Progress Notes (Signed)

## 2021-10-06 NOTE — H&P (Signed)
? ?REFERRING PHYSICIAN: Betances ? ?PROVIDER: Beverlee Nims, MD ? ?MRN: [id number] ?DOB: 1966/06/09 ? ?Subjective  ? ?Chief Complaint: New Consultation (Gallbladder) ? ? ?History of Present Illness: ?Nancy [name] is a 56 y.o. female who is seen  as an office consultation at the request of Dr. Velora Heckler for evaluation of New Consultation (Gallbladder) ?.  ? 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 She had her first ever attack of right upper quadrant abdominal pain with nausea and pain through to the back on January 30. Her pain was 10 out of 10. She did not have emesis. She denied jaundice. She is pain-free today. She underwent an ultrasound showing a contracted gallbladder with gallstones. Her liver function test and white blood count were normal. ? ?Review of Systems: ?A complete review of systems was obtained from the patient. I have reviewed this information and discussed as appropriate with the patient. See HPI as well for other ROS. ? ?ROS  ? ?Medical History: ?Past Medical History:  ?Diagnosis Date  ? Anemia  ? Arthritis  ? Asthma, unspecified asthma severity, unspecified whether complicated, unspecified whether persistent  ? Hyperlipidemia  ? ?There is no problem list on file for this patient. ? ?Past Surgical History:  ?Procedure Laterality Date  ? HYSTERECTOMY  ? ? ?Allergies  ?Allergen Reactions  ? Erythromycin Diarrhea, Nausea And Vomiting and Unknown  ? Fluticasone Propion-Salmeterol Cough and Unknown  ?excessive ? ? Oxycodone-Acetaminophen Other (See Comments) and Unknown  ?hallucinations ? ? ?Current Outpatient Medications on File Prior to Visit  ?Medication Sig Dispense Refill  ? folic acid (FOLVITE) 1 MG tablet Take 1 tablet by mouth once daily  ? gabapentin (NEURONTIN) 300 MG capsule 1 capsule  ? leflunomide (ARAVA) 20 MG tablet Take 1 tablet by mouth once daily  ? traMADoL (ULTRAM) 50 mg tablet 1 tablet as needed  ? abatacept, with  maltose, (ORENCIA, WITH MALTOSE,) 250 mg injection as directed  ? cholecalciferol (VITAMIN D3) 5,000 unit capsule Take by mouth once daily  ? cyanocobalamin (VITAMIN B12) 1000 MCG tablet Take by mouth  ? ?No current facility-administered medications on file prior to visit.  ? ?Family History  ?Problem Relation Age of Onset  ? Hyperlipidemia (Elevated cholesterol) Mother  ? ? ?Social History  ? ?Tobacco Use  ?Smoking Status Never  ?Smokeless Tobacco Never  ? ? ?Social History  ? ?Socioeconomic History  ? Marital status: Married  ?Tobacco Use  ? Smoking status: Never  ? Smokeless tobacco: Never  ?Vaping Use  ? Vaping Use: Never used  ?Substance and Sexual Activity  ? Alcohol use: Yes  ? Drug use: Never  ? ?Objective:  ? ?Vitals:  ? ?BP: (!) 130/90  ?Pulse: 92  ?SpO2: 92%  ?Weight: (!) 107.1 kg (236 lb 3.2 oz)  ?Height: 158.8 cm (5' 2.5")  ? ?Body mass index is 42.51 kg/m?. ? ?Physical Exam  ? ?She appears well on exam ? ?Her abdomen is soft and nontender today. There is no tenderness or guarding in the right upper quadrant. There is no hepatomegaly ? ?Labs, Imaging and Diagnostic Testing: ?I reviewed her ultrasound and laboratory data ? ?Assessment and Plan:  ? ?Symptomatic cholelithiasis ? ?I discussed the diagnosis with her in detail. I gave her literature regarding cholecystitis and cholelithiasis as well as gallbladder surgery. Given the significance of her attack, I would recommend a laparoscopic cholecystectomy. We discussed the reasons for this in detail. I discussed the surgical procedure in  detail. We discussed the risk which includes but is not limited to bleeding, infection, injury to surrounding structures, the need to convert to appropriate procedure, cardiopulmonary issues, postoperative recovery, etc. She understands and wishes to proceed with surgery which will be scheduled.  ?

## 2021-10-07 ENCOUNTER — Other Ambulatory Visit: Payer: Self-pay

## 2021-10-07 ENCOUNTER — Encounter (HOSPITAL_BASED_OUTPATIENT_CLINIC_OR_DEPARTMENT_OTHER)
Admission: RE | Disposition: A | Discharge status: Home / Self Care | Payer: Self-pay | Source: Ambulatory Visit | Attending: Surgery

## 2021-10-07 ENCOUNTER — Ambulatory Visit (HOSPITAL_BASED_OUTPATIENT_CLINIC_OR_DEPARTMENT_OTHER)
Admission: RE | Admit: 2021-10-07 | Discharge: 2021-10-07 | Disposition: A | Discharge status: Home / Self Care | Payer: 59 | Source: Ambulatory Visit | Attending: Surgery | Admitting: Surgery

## 2021-10-07 ENCOUNTER — Ambulatory Visit (HOSPITAL_BASED_OUTPATIENT_CLINIC_OR_DEPARTMENT_OTHER): Payer: 59 | Admitting: Anesthesiology

## 2021-10-07 ENCOUNTER — Encounter (HOSPITAL_BASED_OUTPATIENT_CLINIC_OR_DEPARTMENT_OTHER): Payer: Self-pay | Admitting: Surgery

## 2021-10-07 DIAGNOSIS — M797 Fibromyalgia: Secondary | ICD-10-CM | POA: Insufficient documentation

## 2021-10-07 DIAGNOSIS — M069 Rheumatoid arthritis, unspecified: Secondary | ICD-10-CM | POA: Diagnosis not present

## 2021-10-07 DIAGNOSIS — K8010 Calculus of gallbladder with chronic cholecystitis without obstruction: Secondary | ICD-10-CM | POA: Insufficient documentation

## 2021-10-07 DIAGNOSIS — M1990 Unspecified osteoarthritis, unspecified site: Secondary | ICD-10-CM | POA: Diagnosis not present

## 2021-10-07 DIAGNOSIS — Z6841 Body Mass Index (BMI) 40.0 and over, adult: Secondary | ICD-10-CM | POA: Diagnosis not present

## 2021-10-07 DIAGNOSIS — E785 Hyperlipidemia, unspecified: Secondary | ICD-10-CM | POA: Insufficient documentation

## 2021-10-07 DIAGNOSIS — Z79899 Other long term (current) drug therapy: Secondary | ICD-10-CM | POA: Insufficient documentation

## 2021-10-07 DIAGNOSIS — J45909 Unspecified asthma, uncomplicated: Secondary | ICD-10-CM | POA: Insufficient documentation

## 2021-10-07 DIAGNOSIS — D849 Immunodeficiency, unspecified: Secondary | ICD-10-CM

## 2021-10-07 DIAGNOSIS — K8020 Calculus of gallbladder without cholecystitis without obstruction: Secondary | ICD-10-CM | POA: Diagnosis not present

## 2021-10-07 HISTORY — PX: CHOLECYSTECTOMY: SHX55

## 2021-10-07 SURGERY — LAPAROSCOPIC CHOLECYSTECTOMY
Anesthesia: General | Site: Abdomen

## 2021-10-07 MED ORDER — CEFAZOLIN SODIUM-DEXTROSE 2-4 GM/100ML-% IV SOLN
2.0000 g | INTRAVENOUS | Status: AC
  Administered 2021-10-07: 11:00:00 2 g via INTRAVENOUS

## 2021-10-07 MED ORDER — AMISULPRIDE (ANTIEMETIC) 5 MG/2ML IV SOLN: 10.0000 mg | Freq: Once | INTRAVENOUS | Status: DC | PRN

## 2021-10-07 MED ORDER — DEXAMETHASONE SODIUM PHOSPHATE 4 MG/ML IJ SOLN
INTRAMUSCULAR | Status: DC | PRN
  Administered 2021-10-07: 10 mg via INTRAVENOUS

## 2021-10-07 MED ORDER — CHLORHEXIDINE GLUCONATE CLOTH 2 % EX PADS: 6.0000 | MEDICATED_PAD | Freq: Once | CUTANEOUS | Status: DC

## 2021-10-07 MED ORDER — MIDAZOLAM HCL 2 MG/2ML IJ SOLN
INTRAMUSCULAR | Status: AC
  Filled 2021-10-07: qty 2

## 2021-10-07 MED ORDER — SUGAMMADEX SODIUM 500 MG/5ML IV SOLN
INTRAVENOUS | Status: DC | PRN
  Administered 2021-10-07: 400 mg via INTRAVENOUS

## 2021-10-07 MED ORDER — ONDANSETRON HCL 4 MG/2ML IJ SOLN: 4.0000 mg | Freq: Once | INTRAMUSCULAR | Status: DC | PRN

## 2021-10-07 MED ORDER — BUPIVACAINE-EPINEPHRINE (PF) 0.5% -1:200000 IJ SOLN
INTRAMUSCULAR | Status: AC
  Filled 2021-10-07: qty 120

## 2021-10-07 MED ORDER — SCOPOLAMINE 1 MG/3DAYS TD PT72
1.0000 | MEDICATED_PATCH | TRANSDERMAL | Status: DC
  Administered 2021-10-07: 10:00:00 1.5 mg via TRANSDERMAL

## 2021-10-07 MED ORDER — MIDAZOLAM HCL 5 MG/5ML IJ SOLN
INTRAMUSCULAR | Status: DC | PRN
  Administered 2021-10-07: 2 mg via INTRAVENOUS

## 2021-10-07 MED ORDER — HYDROMORPHONE HCL 1 MG/ML IJ SOLN
INTRAMUSCULAR | Status: AC
  Filled 2021-10-07: qty 0.5

## 2021-10-07 MED ORDER — ONDANSETRON HCL 4 MG/2ML IJ SOLN
INTRAMUSCULAR | Status: AC
  Filled 2021-10-07: qty 2

## 2021-10-07 MED ORDER — ACETAMINOPHEN 500 MG PO TABS
ORAL_TABLET | ORAL | Status: AC
  Filled 2021-10-07: qty 2

## 2021-10-07 MED ORDER — ONDANSETRON HCL 4 MG/2ML IJ SOLN
INTRAMUSCULAR | Status: DC | PRN
  Administered 2021-10-07: 4 mg via INTRAVENOUS

## 2021-10-07 MED ORDER — PROPOFOL 10 MG/ML IV BOLUS
INTRAVENOUS | Status: AC
Start: 2021-10-07 — End: ?
  Filled 2021-10-07: qty 20

## 2021-10-07 MED ORDER — ACETAMINOPHEN 500 MG PO TABS
1000.0000 mg | ORAL_TABLET | ORAL | Status: AC
  Administered 2021-10-07: 10:00:00 1000 mg via ORAL

## 2021-10-07 MED ORDER — LACTATED RINGERS IV SOLN: INTRAVENOUS | Status: DC

## 2021-10-07 MED ORDER — SODIUM CHLORIDE 0.9 % IR SOLN
Status: DC | PRN
Start: 2021-10-07 — End: 2021-10-07
  Administered 2021-10-07: 1000 mL

## 2021-10-07 MED ORDER — CEFAZOLIN SODIUM-DEXTROSE 2-4 GM/100ML-% IV SOLN
INTRAVENOUS | Status: AC
  Filled 2021-10-07: qty 100

## 2021-10-07 MED ORDER — ROCURONIUM BROMIDE 100 MG/10ML IV SOLN
INTRAVENOUS | Status: DC | PRN
Start: 2021-10-07 — End: 2021-10-07
  Administered 2021-10-07: 60 mg via INTRAVENOUS

## 2021-10-07 MED ORDER — HYDROMORPHONE HCL 1 MG/ML IJ SOLN
0.2500 mg | INTRAMUSCULAR | Status: DC | PRN
  Administered 2021-10-07 (×2): 0.5 mg via INTRAVENOUS

## 2021-10-07 MED ORDER — SCOPOLAMINE 1 MG/3DAYS TD PT72
MEDICATED_PATCH | TRANSDERMAL | Status: AC
  Filled 2021-10-07: qty 1

## 2021-10-07 MED ORDER — FENTANYL CITRATE (PF) 100 MCG/2ML IJ SOLN
INTRAMUSCULAR | Status: AC
  Filled 2021-10-07: qty 2

## 2021-10-07 MED ORDER — LIDOCAINE HCL (CARDIAC) PF 100 MG/5ML IV SOSY
PREFILLED_SYRINGE | INTRAVENOUS | Status: DC | PRN
  Administered 2021-10-07: 80 mg via INTRAVENOUS

## 2021-10-07 MED ORDER — FENTANYL CITRATE (PF) 100 MCG/2ML IJ SOLN
INTRAMUSCULAR | Status: DC | PRN
  Administered 2021-10-07: 100 ug via INTRAVENOUS

## 2021-10-07 MED ORDER — DEXAMETHASONE SODIUM PHOSPHATE 10 MG/ML IJ SOLN
INTRAMUSCULAR | Status: AC
  Filled 2021-10-07: qty 1

## 2021-10-07 MED ORDER — BUPIVACAINE-EPINEPHRINE (PF) 0.5% -1:200000 IJ SOLN
INTRAMUSCULAR | Status: DC | PRN
  Administered 2021-10-07: 20 mL

## 2021-10-07 MED ORDER — CHLORHEXIDINE GLUCONATE CLOTH 2 % EX PADS
6.0000 | MEDICATED_PAD | Freq: Once | CUTANEOUS | Status: DC
Start: 2021-10-07 — End: 2021-10-07

## 2021-10-07 MED ORDER — PROPOFOL 10 MG/ML IV BOLUS
INTRAVENOUS | Status: DC | PRN
  Administered 2021-10-07: 150 mg via INTRAVENOUS

## 2021-10-07 SURGICAL SUPPLY — 39 items
ADH SKN CLS APL DERMABOND .7 (GAUZE/BANDAGES/DRESSINGS) ×1
APL PRP STRL LF DISP 70% ISPRP (MISCELLANEOUS) ×1
APPLIER CLIP 5 13 M/L LIGAMAX5 (MISCELLANEOUS) ×2
APR CLP MED LRG 5 ANG JAW (MISCELLANEOUS) ×1
BAG RETRIEVAL 10 (BASKET) ×1
CHLORAPREP W/TINT 26 (MISCELLANEOUS) ×2 IMPLANT
CLIP APPLIE 5 13 M/L LIGAMAX5 (MISCELLANEOUS) ×1 IMPLANT
DERMABOND ADVANCED (GAUZE/BANDAGES/DRESSINGS) ×1
DERMABOND ADVANCED .7 DNX12 (GAUZE/BANDAGES/DRESSINGS) ×1 IMPLANT
ELECT REM PT RETURN 9FT ADLT (ELECTROSURGICAL) ×2
ELECTRODE REM PT RTRN 9FT ADLT (ELECTROSURGICAL) ×1 IMPLANT
GAUZE 4X4 16PLY ~~LOC~~+RFID DBL (SPONGE) ×2 IMPLANT
GLOVE SURG POLYISO LF SZ6.5 (GLOVE) ×1 IMPLANT
GLOVE SURG POLYISO LF SZ7 (GLOVE) ×1 IMPLANT
GLOVE SURG SIGNA 7.5 PF LTX (GLOVE) ×2 IMPLANT
GLOVE SURG UNDER POLY LF SZ6.5 (GLOVE) ×1 IMPLANT
GLOVE SURG UNDER POLY LF SZ7 (GLOVE) ×1 IMPLANT
GOWN STRL REUS W/ TWL LRG LVL3 (GOWN DISPOSABLE) ×2 IMPLANT
GOWN STRL REUS W/ TWL XL LVL3 (GOWN DISPOSABLE) ×1 IMPLANT
GOWN STRL REUS W/TWL LRG LVL3 (GOWN DISPOSABLE) ×4
GOWN STRL REUS W/TWL XL LVL3 (GOWN DISPOSABLE) ×2
IRRIG SUCT STRYKERFLOW 2 WTIP (MISCELLANEOUS) ×2
IRRIGATION SUCT STRKRFLW 2 WTP (MISCELLANEOUS) ×1 IMPLANT
NS IRRIG 1000ML POUR BTL (IV SOLUTION) ×2 IMPLANT
PACK BASIN DAY SURGERY FS (CUSTOM PROCEDURE TRAY) ×2 IMPLANT
SCISSORS LAP 5X35 DISP (ENDOMECHANICALS) ×2 IMPLANT
SET TUBE SMOKE EVAC HIGH FLOW (TUBING) ×2 IMPLANT
SLEEVE ENDOPATH XCEL 5M (ENDOMECHANICALS) ×4 IMPLANT
SLEEVE SCD COMPRESS KNEE MED (STOCKING) ×2 IMPLANT
SPECIMEN JAR SMALL (MISCELLANEOUS) ×2 IMPLANT
SPIKE FLUID TRANSFER (MISCELLANEOUS) ×2 IMPLANT
SUT MON AB 4-0 PC3 18 (SUTURE) ×2 IMPLANT
SYS BAG RETRIEVAL 10MM (BASKET) ×1
SYSTEM BAG RETRIEVAL 10MM (BASKET) ×1 IMPLANT
TOWEL GREEN STERILE FF (TOWEL DISPOSABLE) ×2 IMPLANT
TRAY LAPAROSCOPIC (CUSTOM PROCEDURE TRAY) ×2 IMPLANT
TROCAR XCEL BLUNT TIP 100MML (ENDOMECHANICALS) ×2 IMPLANT
TROCAR XCEL NON-BLD 5MMX100MML (ENDOMECHANICALS) ×2 IMPLANT
TUBE CONNECTING 20X1/4 (TUBING) ×2 IMPLANT

## 2021-10-07 NOTE — Op Note (Signed)
Laparoscopic Cholecystectomy Procedure Note ? ?Indications: This patient presents with symptomatic gallbladder disease and will undergo laparoscopic cholecystectomy. ? ?Pre-operative Diagnosis: symptomatic cholelithiasis ? ?Post-operative Diagnosis: Same ? ?Surgeon: Coralie Keens MD ? ?Assistants: Albin Felling MD, [facility] Resident ? ?Anesthesia: General endotracheal anesthesia ? ?ASA Class: 2 ? ?Procedure Details  ?The patient was seen again in the Holding Room. The risks, benefits, complications, treatment options, and expected outcomes were discussed with the patient. The possibilities of reaction to medication, pulmonary aspiration, perforation of viscus, bleeding, recurrent infection, finding a normal gallbladder, the need for additional procedures, failure to diagnose a condition, the possible need to convert to an open procedure, and creating a complication requiring transfusion or operation were discussed with the patient. The likelihood of improving the patient's symptoms with return to their baseline status is good.  The patient and/or family concurred with the proposed plan, giving informed consent. The site of surgery properly noted. The patient was taken to Operating Room, identified as Nancy [name] and the procedure verified as Laparoscopic Cholecystectomy with Intraoperative Cholangiogram. A Time Out was held and the above information confirmed. ? ?Prior to the induction of general anesthesia, antibiotic prophylaxis was administered. General endotracheal anesthesia was then administered and tolerated well. After the induction, the abdomen was prepped with Chloraprep and draped in sterile fashion. The patient was positioned in the supine position. ? ?Local anesthetic agent was injected into the skin near the umbilicus and an incision made. We dissected down to the abdominal fascia with blunt dissection.  The fascia was incised vertically and we entered the peritoneal cavity bluntly.  A  pursestring suture of 0-Vicryl was placed around the fascial opening.  The Hasson cannula was inserted and secured with the stay suture.  Pneumoperitoneum was then created with CO2 and tolerated well without any adverse changes in the patient's vital signs. A 5-mm port was placed in the subxiphoid position.  Two 5-mm ports were placed in the right upper quadrant. All skin incisions were infiltrated with a local anesthetic agent before making the incision and placing the trocars.  ? ?We positioned the patient in reverse Trendelenburg, tilted slightly to the patient's left.  The gallbladder was identified and was chronically thick walled with omentum adhered to it.  The fundus grasped and retracted cephalad. Adhesions were lysed bluntly and with the electrocautery where indicated, taking care not to injure any adjacent organs or viscus. The infundibulum was grasped and retracted laterally, exposing the peritoneum overlying the triangle of Calot. This was then divided and exposed in a blunt fashion. The cystic duct was clearly identified and bluntly dissected circumferentially. A critical view of the cystic duct and cystic artery was obtained.  The cystic artery was, dissected free, ligated with clips and divided. The cystic duct was then ligated with clips and divided.  ? ?The gallbladder was dissected from the liver bed in retrograde fashion with the electrocautery. The gallbladder was removed and placed in an Endocatch sac. The liver bed was irrigated and inspected. Hemostasis was achieved with the electrocautery. Copious irrigation was utilized and was repeatedly aspirated until clear.  The gallbladder and Endocatch sac were then removed through the umbilical port site.  The pursestring suture was used to close the umbilical fascia.   ? ?We again inspected the right upper quadrant for hemostasis.  Pneumoperitoneum was released as we removed the trocars.  4-0 Monocryl was used to close the skin.   Skin glue was then  applied. The patient was then extubated and brought  to the recovery room in stable condition. Instrument, sponge, and needle counts were correct at closure and at the conclusion of the case.  ? ?Findings: ?Chronic cholecystitis with Cholelithiasis ? ?Estimated Blood Loss: Minimal ?        ?Drains: 0 ?        ?Specimens: Gallbladder     ?      ?Complications: None; patient tolerated the procedure well. ?        ?Disposition: PACU - hemodynamically stable. ?        ?Condition: stable ? ?  ?

## 2021-10-07 NOTE — Anesthesia Preprocedure Evaluation (Addendum)
Anesthesia Evaluation  ?Patient identified by MRN, date of birth, ID band ?Patient awake ? ? ? ?Reviewed: ?Allergy & Precautions, NPO status , Patient's Chart, lab work & pertinent test results ? ?History of Anesthesia Complications ?(+) PONV and history of anesthetic complications ? ?Airway ?Mallampati: I ? ?TM Distance: >3 FB ?Neck ROM: Full ? ? ? Dental ?no notable dental hx. ? ?  ?Pulmonary ?asthma ,  ?  ?Pulmonary exam normal ?breath sounds clear to auscultation ? ? ? ? ? ? Cardiovascular ?Exercise Tolerance: Good ?Normal cardiovascular exam+ dysrhythmias (bradycardia)  ?Rhythm:Regular Rate:Normal ? ? ?  ?Neuro/Psych ? Headaches,  Neuromuscular disease negative psych ROS  ? GI/Hepatic ?negative GI ROS, Neg liver ROS,   ?Endo/Other  ?Morbid obesity ? Renal/GU ?negative Renal ROS  ?negative genitourinary ?  ?Musculoskeletal ? ?(+) Arthritis , Rheumatoid disorders,  Fibromyalgia - ? Abdominal ?Normal abdominal exam  (+)   ?Peds ?negative pediatric ROS ?(+)  Hematology ?negative hematology ROS ?(+)   ?Anesthesia Other Findings ? ? Reproductive/Obstetrics ?negative OB ROS ? ?  ? ? ? ? ? ? ? ? ? ? ? ? ? ?  ?  ? ? ? ? ? ? ? ?Anesthesia Physical ?Anesthesia Plan ? ?ASA: 3 ? ?Anesthesia Plan: General  ? ?Post-op Pain Management:   ? ?Induction:  ? ?PONV Risk Score and Plan: Midazolam, Treatment may vary due to age or medical condition, Ondansetron and Dexamethasone ? ?Airway Management Planned: Oral ETT ? ?Additional Equipment: None ? ?Intra-op Plan:  ? ?Post-operative Plan: Extubation in OR ? ?Informed Consent: I have reviewed the patients History and Physical, chart, labs and discussed the procedure including the risks, benefits and alternatives for the proposed anesthesia with the patient or authorized representative who has indicated his/her understanding and acceptance.  ? ? ? ?Dental advisory given ? ?Plan Discussed with: CRNA, Anesthesiologist and Surgeon ? ?Anesthesia Plan  Comments:   ? ? ? ? ? ? ?Anesthesia Quick Evaluation ? ?

## 2021-10-07 NOTE — Discharge Instructions (Addendum)
[facility] ______[facility] ?LAPAROSCOPIC SURGERY: POST OP INSTRUCTIONS ?Always review your discharge instruction sheet given to you by the facility where your surgery was performed. ?IF YOU HAVE DISABILITY OR FAMILY LEAVE FORMS, YOU MUST BRING THEM TO THE OFFICE FOR PROCESSING.   ?DO NOT GIVE THEM TO YOUR DOCTOR. ? ?A prescription for pain medication may be given to you upon discharge.  Take your pain medication as prescribed, if needed.  If narcotic pain medicine is not needed, then you may take acetaminophen (Tylenol) or ibuprofen (Advil) as needed. ?Take your usually prescribed medications unless otherwise directed. ?If you need a refill on your pain medication, please contact your pharmacy.  They will contact our office to request authorization. Prescriptions will not be filled after 5pm or on week-ends. ?You should follow a light diet the first few days after arrival home, such as soup and crackers, etc.  Be sure to include lots of fluids daily. ?Most patients will experience some swelling and bruising in the area of the incisions.  Ice packs will help.  Swelling and bruising can take several days to resolve.  ?It is common to experience some constipation if taking pain medication after surgery.  Increasing fluid intake and taking a stool softener (such as Colace) will usually help or prevent this problem from occurring.  A mild laxative (Milk of Magnesia or Miralax) should be taken according to package instructions if there are no bowel movements after 48 hours. ?Unless discharge instructions indicate otherwise, you may remove your bandages 24-48 hours after surgery, and you may shower at that time.  You may have steri-strips (small skin tapes) in place directly over the incision.  These strips should be left on the skin for 7-10 days.  If your surgeon used skin glue on the incision, you may shower in 24 hours.  The glue will flake off over the next 2-3 weeks.  Any sutures or staples will be  removed at the office during your follow-up visit. ?ACTIVITIES:  You may resume regular (light) daily activities beginning the next day--such as daily self-care, walking, climbing stairs--gradually increasing activities as tolerated.  You may have sexual intercourse when it is comfortable.  Refrain from any heavy lifting or straining until approved by your doctor. ?You may drive when you are no longer taking prescription pain medication, you can comfortably wear a seatbelt, and you can safely maneuver your car and apply brakes. ?RETURN TO WORK:  __________________________________________________________ ?You should see your doctor in the office for a follow-up appointment approximately 2-3 weeks after your surgery.  Make sure that you call for this appointment within a day or two after you arrive home to insure a convenient appointment time. ?OTHER INSTRUCTIONS: OK TO SHOWER STARTING TOMORROW ?ICE PACK, TYLENOL, AND IBUPROFEN ALSO FOR PAIN ?NO LIFTING MORE THAN 15 TO 20 POUNDS FOR 2 WEEKS ?__________________________________________________________________________________________________________________________ __________________________________________________________________________________________________________________________ ?WHEN TO CALL YOUR DOCTOR: ?Fever over 101.0 ?Inability to urinate ?Continued bleeding from incision. ?Increased pain, redness, or drainage from the incision. ?Increasing abdominal pain ? ?The clinic staff is available to answer your questions during regular business hours.  Please don?t hesitate to call and ask to speak to one of the nurses for clinical concerns.  If you have a medical emergency, go to the nearest emergency room or call 911.  A surgeon from [facility] is always on call at the hospital. ?[address], [place], [place], [place]  [address] ? [address], [place], [place]   [address] ?[phone] ?  [phone] ? FAX [phone] ?Web site:  [email or web address]  ? ?You may have Tylenol again after 4pm today, if needed. ? ? ?Post Anesthesia Home Care Instructions ? ?Activity: ?Get plenty of rest for the remainder of the day. A responsible individual must stay with you for 24 hours following the procedure.  ?For the next 24 hours, DO NOT: ?-Drive a car ?-Paediatric nurse ?-Drink alcoholic beverages ?-Take any medication unless instructed by your physician ?-Make any legal decisions or sign important papers. ? ?Meals: ?Start with liquid foods such as gelatin or soup. Progress to regular foods as tolerated. Avoid greasy, spicy, heavy foods. If nausea and/or vomiting occur, drink only clear liquids until the nausea and/or vomiting subsides. Call your physician if vomiting continues. ? ?Special Instructions/Symptoms: ?Your throat may feel dry or sore from the anesthesia or the breathing tube placed in your throat during surgery. If this causes discomfort, gargle with warm salt water. The discomfort should disappear within 24 hours. ? ?If you had a scopolamine patch placed behind your ear for the management of post- operative nausea and/or vomiting: ? ?1. The medication in the patch is effective for 72 hours, after which it should be removed.  Wrap patch in a tissue and discard in the trash. Wash hands thoroughly with soap and water. ?2. You may remove the patch earlier than 72 hours if you experience unpleasant side effects which may include dry mouth, dizziness or visual disturbances. ?3. Avoid touching the patch. Wash your hands with soap and water after contact with the patch. ?    ?

## 2021-10-07 NOTE — Anesthesia Procedure Notes (Signed)
Procedure Name: Intubation ?Date/Time: 10/07/2021 11:07 AM ?Performed by: Verita Lamb, CRNA ?Pre-anesthesia Checklist: Patient identified, Emergency Drugs available, Suction available and Patient being monitored ?Patient Re-evaluated:Patient Re-evaluated prior to induction ?Oxygen Delivery Method: Circle system utilized ?Preoxygenation: Pre-oxygenation with 100% oxygen ?Induction Type: IV induction ?Ventilation: Mask ventilation without difficulty ?Laryngoscope Size: Mac and 4 ?Grade View: Grade I ?Tube type: Oral ?Tube size: 7.0 mm ?Number of attempts: 1 ?Airway Equipment and Method: Stylet and Oral airway ?Placement Confirmation: ETT inserted through vocal cords under direct vision, positive ETCO2 and breath sounds checked- equal and bilateral ?Tube secured with: Tape ?Dental Injury: Teeth and Oropharynx as per pre-operative assessment  ? ? ? ? ?

## 2021-10-07 NOTE — Anesthesia Postprocedure Evaluation (Signed)
Anesthesia Post Note ? ?Patient: Nancy [name] ? ?Procedure(s) Performed: LAPAROSCOPIC CHOLECYSTECTOMY (Abdomen) ? ?  ? ?Patient location during evaluation: PACU ?Anesthesia Type: General ?Level of consciousness: awake ?Pain management: pain level controlled ?Vital Signs Assessment: post-procedure vital signs reviewed and stable ?Respiratory status: spontaneous breathing and respiratory function stable ?Cardiovascular status: stable ?Postop Assessment: no apparent nausea or vomiting ?Anesthetic complications: no ? ? ?No notable events documented. ? ?Last Vitals:  ?Vitals:  ? 10/07/21 1245 10/07/21 1258  ?BP: 136/81 125/76  ?Pulse: 63 62  ?Resp: 14 17  ?Temp:  36.4 ?C  ?SpO2: 97% 94%  ?  ?Last Pain:  ?Vitals:  ? 10/07/21 1258  ?TempSrc:   ?PainSc: 3   ? ? ?  ?  ?  ?  ?  ?  ? ?[name] ? ? ? ? ?

## 2021-10-07 NOTE — Transfer of Care (Signed)
Immediate Anesthesia Transfer of Care Note ? ?Patient: Nancy [name] ? ?Procedure(s) Performed: LAPAROSCOPIC CHOLECYSTECTOMY (Abdomen) ? ?Patient Location: PACU ? ?Anesthesia Type:General ? ?Level of Consciousness: awake, alert  and oriented ? ?Airway & Oxygen Therapy: Patient Spontanous Breathing and Patient connected to face mask oxygen ? ?Post-op Assessment: Report given to RN and Post -op Vital signs reviewed and stable ? ?Post vital signs: Reviewed and stable ? ?Last Vitals:  ?Vitals Value Taken Time  ?BP 140/86 10/07/21 1156  ?Temp    ?Pulse 76 10/07/21 1158  ?Resp 22 10/07/21 1158  ?SpO2 97 % 10/07/21 1158  ?Vitals shown include unvalidated device data. ? ?Last Pain:  ?Vitals:  ? 10/07/21 0955  ?TempSrc: Oral  ?PainSc: 3   ?   ? ?Patients Stated Pain Goal: 3 (10/07/21 0955) ? ?Complications: No notable events documented. ?

## 2021-10-07 NOTE — Interval H&P Note (Signed)
History and Physical Interval Note:no change in H and P ? ?10/07/2021 ?10:44 AM ? ?Naraly Fritcher  has presented today for surgery, with the diagnosis of SYMPTOMATIC GALLSTONES.  The various methods of treatment have been discussed with the patient and family. After consideration of risks, benefits and other options for treatment, the patient has consented to  Procedure(s): ?LAPAROSCOPIC CHOLECYSTECTOMY (N/A) as a surgical intervention.  The patient's history has been reviewed, patient examined, no change in status, stable for surgery.  I have reviewed the patient's chart and labs.  Questions were answered to the patient's satisfaction.   ? ? ?Coralie Keens ? ? ?

## 2021-10-08 ENCOUNTER — Encounter (HOSPITAL_BASED_OUTPATIENT_CLINIC_OR_DEPARTMENT_OTHER): Payer: Self-pay | Admitting: Surgery

## 2021-10-08 LAB — SURGICAL PATHOLOGY

## 2021-10-20 ENCOUNTER — Other Ambulatory Visit (HOSPITAL_COMMUNITY): Payer: Self-pay

## 2021-10-21 DIAGNOSIS — M0579 Rheumatoid arthritis with rheumatoid factor of multiple sites without organ or systems involvement: Secondary | ICD-10-CM | POA: Diagnosis not present

## 2021-11-09 ENCOUNTER — Other Ambulatory Visit: Payer: Self-pay | Admitting: Physician Assistant

## 2021-11-09 DIAGNOSIS — M5416 Radiculopathy, lumbar region: Secondary | ICD-10-CM

## 2021-11-10 ENCOUNTER — Ambulatory Visit: Payer: 59 | Admitting: Family Medicine

## 2021-11-10 VITALS — BP 128/82 | HR 53 | Ht 62.0 in | Wt 244.2 lb

## 2021-11-10 DIAGNOSIS — M5416 Radiculopathy, lumbar region: Secondary | ICD-10-CM

## 2021-11-10 NOTE — Patient Instructions (Addendum)
Thank you for coming in today.  ? ?Please call [facility] at [phone] to schedule your spine injection.   ? ?Recheck back as needed ? ? ?

## 2021-11-10 NOTE — Progress Notes (Signed)
? ?I, Peterson Lombard, LAT, ATC acting as a scribe for Lynne Leader, MD. ? ?Nancy [name] is a 56 y.o. female who presents to [place] at [facility] today for low back pain. Pt has a hx of lumbar radiculopathy and was seen previously in Sept 2022 by [facility] and has completed PT at [facility]. Pt was previously seen by Dr. Georgina Snell on 06/23/21 for bilat knee pain. Pt has had prior facet injections bilat, performed on 01/28/21. Today, pt reports LBP ongoing since 4/5. Pt was out of work recovering from gallbladder surgery for the past 4 weeks and then pain really increased when returning to work on Monday, 4/10. Pt locates pain to both side of the low back, much worse on the L.  ? ?Radiating pain: yes- whole L leg to L foot ?LE numbness/tingling: no ?LE weakness: unable to determine ?Aggravates: being on her feet working, sleeping ?Treatments tried: heat, IBU, rest, ice, tramadol, tizanidine ? ?Dx imaging: 01/10/21 L-spine MRI ? 11/12/20 R hip XR ? 04/02/20 L-spine MRI ?  ?Pertinent review of systems: No fevers or chills ? ?Relevant historical information: Lumbar facet DJD.  Laparoscopic cholecystectomy about a month ago. ? ? ?Exam:  ?BP 128/82   Pulse (!) 53   Ht '5\' 2"'$  (1.575 m)   Wt 244 lb 3.2 oz (110.8 kg)   SpO2 99%   BMI 44.66 kg/m?  ?General: Well Developed, well nourished, and in no acute distress.  ? ?MSK: L-spine: Decreased lumbar motion. ?Lower extremity strength is intact. ?Positive slump test. ? ? ? ?Lab and Radiology Results ?EXAM: ?MRI LUMBAR SPINE WITHOUT CONTRAST ?  ?TECHNIQUE: ?Multiplanar, multisequence MR imaging of the lumbar spine was ?performed. No intravenous contrast was administered. ?  ?COMPARISON:  Prior MRI from/2/21. ?  ?FINDINGS: ?Segmentation: Standard. Lowest well-formed disc space labeled the ?L5-S1 level. ?  ?Alignment: Trace retrolisthesis of L2 on L3 and L3 on L4, with trace ?anterolisthesis of L4 on L5, stable from previous. Findings  chronic ?and facet mediated. ?  ?Vertebrae: Vertebral body height maintained without acute or chronic ?fracture. Bone marrow signal intensity within normal limits. ?Multiple scattered benign hemangiomata noted. No worrisome osseous ?lesions. Prominent reactive marrow edema present about the L4-5 ?facets due to facet arthritis, greater on the left. ?  ?Conus medullaris and cauda equina: Conus extends to the L1 level. ?Conus medullaris within normal limits. Prominent fatty filum ?terminale again noted. ?  ?Paraspinal and other soft tissues: Unremarkable. ?  ?Disc levels: ?  ?L1-2:  Unremarkable. ?  ?L2-3: Trace retrolisthesis. Mild disc bulge with disc desiccation. ?Superimposed small right extraforaminal disc protrusion closely ?approximates the exiting right L2 nerve root (series 14, image 15). ?Mild facet hypertrophy with small joint effusions. No spinal ?stenosis. Foramina remain patent. ?  ?L3-4: Trace retrolisthesis. Mild disc bulge with disc desiccation. ?Superimposed shallow left subarticular to foraminal disc protrusion ?(series 14, image 20). Superimposed subtle tiny disc extrusion with ?inferior migration present as well (series 14, image 22). Disc ?material closely approximates and/or contacts both the left L3 and ?descending L4 nerve roots. Superimposed mild facet hypertrophy. Mild ?left lateral recess stenosis. Central canal remains patent. No ?significant foraminal encroachment. Narrowing of the left lateral ?recess. ?  ?L4-5: Trace anterolisthesis. Mild disc bulge. Superimposed shallow ?central to right foraminal disc protrusion (series 14, image 25). ?Moderate facet and ligament flavum hypertrophy with prominent joint ?effusions and reactive marrow edema. No significant spinal stenosis. ?Mild bilateral L4 foraminal narrowing. ?  ?L5-S1: Disc desiccation  with minimal disc bulge. Mild facet ?hypertrophy. No spinal stenosis. Foramina remain patent. ?  ?IMPRESSION: ?1. Small right extraforaminal disc  protrusion at L2-3, closely ?approximating and potentially irritating the exiting right L2 nerve ?root. ?2. Shallow left subarticular to foraminal disc protrusion/extrusion ?at L3-4, potentially affecting either the left L3 or descending L4 ?nerve roots. ?3. Shallow central to right foraminal disc protrusion at L4-5 ?without impingement. ?4. Moderate facet hypertrophy at L4-5 with prominent joint effusions ?and reactive marrow edema. Finding could serve as a source for lower ?back pain. ?  ?  ?Electronically Signed ?  By: Jeannine Boga M.D. ?  On: 01/11/2021 04:13 ?  ?I, Lynne Leader, personally (independently) visualized and performed the interpretation of the images attached in this note. ? ? ? ? ?Assessment and Plan: ?56 y.o. female with left lumbar radiculopathy at L5 dermatomal pattern.  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  ?Plan for gabapentin and epidural steroid injection.  Would like to try to avoid oral steroids if possible.Marland Kitchen  She will keep me updated leading into and following her epidural steroid injection. ? ? ?PDMP not reviewed this encounter. ?Orders Placed This Encounter  ?Procedures  ? DG INJECT DIAG/THERA/INC NEEDLE/CATH/PLC EPI/LUMB/SAC W/IMG  ?  LUMB EPI #1 UMR  PACS (01/10/21) 240LBS NO THINS/OTC *SCREENED* ?PT AWARE NO SHOW FEE  ?  Standing Status:   Future  ?  Standing Expiration Date:   11/11/2022  ?  Order Specific Question:   Reason for Exam (SYMPTOM  OR DIAGNOSIS REQUIRED)  ?  Answer:   Left L5 radiculopathy. Level and technique per radiology  ?  Order Specific Question:   Is the patient pregnant?  ?  Answer:   No  ?  Order Specific Question:   Preferred Imaging Location?  ?  Answer:   [id number]  ?  Order Specific Question:   Radiology Contrast Protocol - do NOT remove file path  ?  Answer:   [email or web address]  ? ?No orders of  the defined types were placed in this encounter. ? ? ? ?Discussed warning signs or symptoms. Please see discharge instructions. Patient expresses understanding. ? ? ?The above documentation has been reviewed and is accurate and complete Lynne Leader, M.D. ? ? ?

## 2021-11-12 ENCOUNTER — Other Ambulatory Visit (HOSPITAL_COMMUNITY): Payer: Self-pay

## 2021-11-15 ENCOUNTER — Other Ambulatory Visit (HOSPITAL_COMMUNITY): Payer: Self-pay

## 2021-11-15 MED ORDER — LEFLUNOMIDE 20 MG PO TABS
20.0000 mg | ORAL_TABLET | Freq: Every day | ORAL | 3 refills | Status: DC
  Filled 2021-11-15: qty 30, 30d supply, fill #0
  Filled 2021-12-15: qty 30, 30d supply, fill #1
  Filled 2022-01-11: qty 30, 30d supply, fill #2
  Filled 2022-02-09: qty 30, 30d supply, fill #3

## 2021-11-16 ENCOUNTER — Ambulatory Visit
Admission: RE | Admit: 2021-11-16 | Discharge: 2021-11-16 | Disposition: A | Discharge status: Home / Self Care | Payer: 59 | Source: Ambulatory Visit | Attending: Family Medicine | Admitting: Family Medicine

## 2021-11-16 DIAGNOSIS — M4727 Other spondylosis with radiculopathy, lumbosacral region: Secondary | ICD-10-CM | POA: Diagnosis not present

## 2021-11-16 DIAGNOSIS — M5416 Radiculopathy, lumbar region: Secondary | ICD-10-CM

## 2021-11-16 MED ORDER — IOPAMIDOL (ISOVUE-M 200) INJECTION 41%
1.0000 mL | Freq: Once | INTRAMUSCULAR | Status: AC
  Administered 2021-11-16: 1 mL via EPIDURAL

## 2021-11-16 MED ORDER — METHYLPREDNISOLONE ACETATE 40 MG/ML INJ SUSP (RADIOLOG
80.0000 mg | Freq: Once | INTRAMUSCULAR | Status: AC
  Administered 2021-11-16: 80 mg via EPIDURAL

## 2021-11-16 NOTE — Discharge Instructions (Signed)

## 2021-11-18 DIAGNOSIS — M0579 Rheumatoid arthritis with rheumatoid factor of multiple sites without organ or systems involvement: Secondary | ICD-10-CM | POA: Diagnosis not present

## 2021-11-23 ENCOUNTER — Telehealth: Payer: Self-pay | Admitting: Family Medicine

## 2021-11-23 DIAGNOSIS — G8929 Other chronic pain: Secondary | ICD-10-CM

## 2021-11-23 NOTE — Telephone Encounter (Signed)
Patient called stating that she had an epidural injection on 4/18. They told her that she would need to have her facets injected in a least 2 weeks. She asked if Dr Georgina Snell would be able to put the order in for her so that she could get it scheduled? ? ?Please advise. ? ?

## 2021-11-24 NOTE — Telephone Encounter (Signed)
Called pt and advised that facet injections were ordered and that she can call [facility] to schedule. ?

## 2021-11-24 NOTE — Telephone Encounter (Signed)
Facet injections ordered.  Please contact [facility] to schedule.  Phone number is [phone] ?

## 2021-12-03 ENCOUNTER — Ambulatory Visit
Admission: RE | Admit: 2021-12-03 | Discharge: 2021-12-03 | Disposition: A | Discharge status: Home / Self Care | Payer: 59 | Source: Ambulatory Visit | Attending: Family Medicine | Admitting: Family Medicine

## 2021-12-03 DIAGNOSIS — M545 Low back pain, unspecified: Secondary | ICD-10-CM | POA: Diagnosis not present

## 2021-12-03 MED ORDER — METHYLPREDNISOLONE ACETATE 40 MG/ML INJ SUSP (RADIOLOG
80.0000 mg | Freq: Once | INTRAMUSCULAR | Status: AC
  Administered 2021-12-03: 80 mg via INTRA_ARTICULAR

## 2021-12-03 MED ORDER — IOPAMIDOL (ISOVUE-M 200) INJECTION 41%
1.0000 mL | Freq: Once | INTRAMUSCULAR | Status: AC
  Administered 2021-12-03: 1 mL via INTRA_ARTICULAR

## 2021-12-03 NOTE — Telephone Encounter (Signed)
Received a call from [facility] stating that the patient was in the office getting ready to have the procedure. The order was sent for L5-S1 but per the patient, the pain is the same as last year. The order last year was L4-L5. After checking chart and speaking to [facility], they are going to change the order to L4-L5 per patient request. ? ?Just FYI. ?

## 2021-12-03 NOTE — Discharge Instructions (Signed)

## 2021-12-15 ENCOUNTER — Other Ambulatory Visit: Payer: Self-pay | Admitting: Family Medicine

## 2021-12-15 ENCOUNTER — Other Ambulatory Visit (HOSPITAL_COMMUNITY): Payer: Self-pay

## 2021-12-15 MED ORDER — GABAPENTIN 300 MG PO CAPS
300.0000 mg | ORAL_CAPSULE | Freq: Three times a day (TID) | ORAL | 3 refills | Status: DC
  Filled 2021-12-15: qty 360, 90d supply, fill #0
  Filled 2022-03-12: qty 360, 90d supply, fill #1
  Filled 2022-06-09: qty 360, 90d supply, fill #2
  Filled 2022-11-23: qty 360, 90d supply, fill #3

## 2021-12-15 MED ORDER — ESTRADIOL 0.1 MG/GM VA CREA
TOPICAL_CREAM | VAGINAL | 0 refills | Status: DC
  Filled 2021-12-15: qty 42.5, 90d supply, fill #0

## 2021-12-16 DIAGNOSIS — M0579 Rheumatoid arthritis with rheumatoid factor of multiple sites without organ or systems involvement: Secondary | ICD-10-CM | POA: Diagnosis not present

## 2021-12-30 ENCOUNTER — Ambulatory Visit: Payer: 59 | Admitting: Family Medicine

## 2021-12-30 ENCOUNTER — Encounter: Payer: Self-pay | Admitting: Family Medicine

## 2021-12-30 VITALS — BP 112/70 | HR 61 | Temp 97.2°F | Ht 62.0 in | Wt 235.0 lb

## 2021-12-30 DIAGNOSIS — M79671 Pain in right foot: Secondary | ICD-10-CM | POA: Diagnosis not present

## 2021-12-30 NOTE — Progress Notes (Signed)
Phone [phone] In person visit   Subjective:   Nancy [name] is a 56 y.o. year old very pleasant female patient who presents for/with See problem oriented charting Chief Complaint  Patient presents with   right foot pain    Right foot pain that started 2 months ago.    Past Medical History-  Patient Active Problem List   Diagnosis Date Noted   Immunosuppressed status ([place]) 12/26/2019    Priority: High   Rheumatoid arthritis involving multiple sites with positive rheumatoid factor ([place]) 02/21/2019    Priority: High   Fibromyalgia 04/12/2018    Priority: High   Hyperlipidemia 12/25/2019    Priority: Medium    Vaginal dryness 04/12/2018    Priority: Medium    Morbid obesity ([place]) 04/12/2018    Priority: Medium    History of colon polyps 04/12/2018    Priority: Medium    Primary osteoarthritis of both knees 12/28/2017    Priority: Medium    Mild intermittent asthma 11/20/2013    Priority: Medium    Bradycardia 07/04/2012    Priority: Medium    Vitamin B12 deficiency 02/14/2019    Priority: Low   Vitamin D deficiency 04/12/2018    Priority: Low   History of total hysterectomy 04/12/2018    Priority: Low   Insomnia 04/12/2018    Priority: Low   Cervicogenic headache 03/26/2014    Priority: Low   Cervical radiculopathy 03/26/2014    Priority: Low   Urge incontinence 12/20/2018   Urinary frequency 12/20/2018    Medications- reviewed and updated Current Outpatient Medications  Medication Sig Dispense Refill   Abatacept (ORENCIA IV) Inject into the vein.     Albuterol Sulfate (PROAIR RESPICLICK) 295 (90 Base) MCG/ACT AEPB Inhale 1 puff into the lungs every 6 (six) hours as needed. 1 each 3   Cholecalciferol (VITAMIN D3) 125 MCG (5000 UT) CAPS Take by mouth daily.     cyanocobalamin 1000 MCG tablet Take 1,000 mcg by mouth daily.     estradiol (ESTRACE) 0.1 MG/GM vaginal cream Insert 1/2 gram into vagina 1 to 3 times per week. Short course before starting  tablets back 42.5 g 0   Estradiol 10 MCG TABS vaginal tablet Place 1 tablet (10 mcg total) vaginally 2 (two) times a week. 24 tablet 3   folic acid (FOLVITE) 1 MG tablet TAKE 1 TABLET BY MOUTH DAILY 90 tablet 3   gabapentin (NEURONTIN) 300 MG capsule Take 1 capsule by mouth each morning and 1 capsule each afternoon and 2 capsules before bed as directed 360 capsule 3   leflunomide (ARAVA) 20 MG tablet Take 1 tablet by mouth daily. 30 tablet 3   levocetirizine (XYZAL) 5 MG tablet Take 1 tablet (5 mg total) by mouth every evening. 90 tablet 1   melatonin 5 MG TABS 1 tablet at bedtime as needed with food     ondansetron (ZOFRAN-ODT) 4 MG disintegrating tablet Take 1 tablet (4 mg total) by mouth every 8 (eight) hours as needed for nausea or vomiting. 20 tablet 0   predniSONE (DELTASONE) 10 MG tablet TAKE 1 TABLET BY MOUTH DAILY AS NEEDED 30 tablet 1   traMADol (ULTRAM) 50 MG tablet Take 1 to 2 tablets by mouth every 4-6 hours as needed for moderate pain (max of 8 per day). 180 tablet 1   Turmeric 500 MG CAPS Take by mouth 2 (two) times daily.     valACYclovir (VALTREX) 1000 MG tablet Take 1 tablet (1,000 mg total) by mouth  2 (two) times daily. 30 tablet 0   vitamin C (ASCORBIC ACID) 500 MG tablet Take 500 mg by mouth as needed.     Zinc Sulfate (ZINC 15 PO) Take 30 mg by mouth.     hydrochlorothiazide (HYDRODIURIL) 25 MG tablet TAKE 1 TABLET BY MOUTH DAILY AS NEEDED (FLUID RETENTION/EDEMA). 90 tablet 1   No current facility-administered medications for this visit.     Objective:  BP 112/70   Pulse 61   Temp (!) 97.2 F (36.2 C)   Ht '5\' 2"'$  (1.575 m)   Wt 235 lb (106.6 kg)   SpO2 97%   BMI 42.98 kg/m  Right lateral foot over metatarsals tender to palpation Pain with palpation of insertion over plantar fascia Reasonable range of motion of the foot and no pain detected in other areas    Assessment and Plan   #Right foot pain S: Pain started about 2 months ago if not longer and is located  on lateral portion and into the heel ( more about a month of pain here and worse with first step in AM). Toes have swollen sensation and look slightly swollen. Did drop a tumbler on top of foot 3 months ago or so- bruised significantly. Worse with walking. No rash on the foot. At end of a long day up to 7-8/10. Sunday night probably 3-4/10.  -comes and goes in intensity but never goes away- gets better with ice and ibuprofen. Better with rest over weekend then flares back up in the week. Also wondered if RA had flared.  A/P: I suspect 2 different issues here- 1.  Trauma to right lateral foot 2 or 3 months ago with subsequent pain after that point-we will get x-ray to evaluate for bony abnormalities or nonhealing fracture-refer to sports medicine if continued pain and no obvious source 2.  Plan fasciitis-discussed conservative care with home exercises, continue good support from shoes.  Has already tried NSAIDs and would not recommend continued trial   Recommended follow up: Keep July visit or sooner if needed Future Appointments  Date Time Provider [place]  02/11/2022  4:00 PM Marin Olp, MD [facility]    Lab/Order associations:   ICD-10-CM   1. Right foot pain  M79.671 DG Foot Complete Right      Time Spent: 24 minutes of total time (4:55 PM- 5:19 PM) was spent on the date of the encounter performing the following actions: chart review prior to seeing the patient (rather brief), obtaining history, performing a medically necessary exam, counseling on the work-up and treatment plan, placing orders, and documenting in our EHR.    Return precautions advised.  Garret Reddish, MD

## 2021-12-30 NOTE — Patient Instructions (Addendum)
[facility] contact Please call to schedule visit and/or procedure Address: [address], [place],  [address] Phone: [phone]   Team please give her exercises for plantar fasciitis I want you to do the exercise 3x a week for a month then once a week for another month. Stop any exercise that causes more than 1-2/10 pain increase. If not doing better within 1-2 months let us refer you to sports medicine  Also if no fracture and continued foot pain we could get you into sports medicine  Please go to [place]  central X-ray  - located [address] across the street from [place] - in the basement - Hours: 8:30-5:00 PM M-F (with lunch from 12:30- 1 PM). You do NOT need an appointment.  - Please ensure you are covid symptom free before going in(No fever, chills, cough, congestion, runny nose, shortness of breath, fatigue, body aches, sore throat, headache, nausea, vomiting, diarrhea, or new loss of taste or smell. No known contacts with covid 19 or someone being tested for covid 19)   Recommended follow up: No follow-ups on file.

## 2022-01-03 ENCOUNTER — Other Ambulatory Visit: Payer: 59

## 2022-01-03 ENCOUNTER — Ambulatory Visit (INDEPENDENT_AMBULATORY_CARE_PROVIDER_SITE_OTHER): Payer: 59

## 2022-01-03 DIAGNOSIS — M79671 Pain in right foot: Secondary | ICD-10-CM | POA: Diagnosis not present

## 2022-01-03 DIAGNOSIS — M19071 Primary osteoarthritis, right ankle and foot: Secondary | ICD-10-CM | POA: Diagnosis not present

## 2022-01-03 DIAGNOSIS — M7731 Calcaneal spur, right foot: Secondary | ICD-10-CM | POA: Diagnosis not present

## 2022-01-05 ENCOUNTER — Encounter: Payer: Self-pay | Admitting: Internal Medicine

## 2022-01-05 ENCOUNTER — Telehealth: Payer: Self-pay | Admitting: Family Medicine

## 2022-01-05 ENCOUNTER — Other Ambulatory Visit: Payer: Self-pay | Admitting: Family Medicine

## 2022-01-05 DIAGNOSIS — Z1231 Encounter for screening mammogram for malignant neoplasm of breast: Secondary | ICD-10-CM

## 2022-01-05 NOTE — Telephone Encounter (Signed)
Pt ran for bilat gelsyn/durolane this morning, 01/05/22. Prior gel series was Orthovisc bilat.

## 2022-01-05 NOTE — Telephone Encounter (Signed)
Made appt for bil knees GEL 6/26. Pt states this was discussed at last visit but I do not see a recent auth done.  Per pt, discussed trying a different gel. She thinks she has had Zilretta in the past at Ortho and it did not work for her.

## 2022-01-06 ENCOUNTER — Telehealth: Payer: Self-pay | Admitting: Physical Therapy

## 2022-01-06 ENCOUNTER — Other Ambulatory Visit: Payer: Self-pay | Admitting: Physical Therapy

## 2022-01-06 ENCOUNTER — Telehealth: Payer: Self-pay

## 2022-01-06 DIAGNOSIS — G8929 Other chronic pain: Secondary | ICD-10-CM

## 2022-01-06 DIAGNOSIS — M5416 Radiculopathy, lumbar region: Secondary | ICD-10-CM

## 2022-01-06 NOTE — Telephone Encounter (Signed)
Pt messaged via Teams and asked for repeat lumbar ESI as her L leg pain was returning.  Ordered repeat L L4-5 ESI which was the same one she had performed in April 2023.  Advised pt to call [facility] to schedule.

## 2022-01-06 NOTE — Telephone Encounter (Signed)
Spoke to pt. Informed of approval for bilat Gelsyn or Durolane injections. Pt already scheduled for a visit on 6/26. Updated appt note.

## 2022-01-11 ENCOUNTER — Other Ambulatory Visit (HOSPITAL_COMMUNITY): Payer: Self-pay

## 2022-01-11 ENCOUNTER — Other Ambulatory Visit: Payer: Self-pay | Admitting: *Deleted

## 2022-01-11 MED ORDER — LEVOCETIRIZINE DIHYDROCHLORIDE 5 MG PO TABS
5.0000 mg | ORAL_TABLET | Freq: Every evening | ORAL | 1 refills | Status: DC
  Filled 2022-01-11: qty 90, 90d supply, fill #0
  Filled 2022-11-23: qty 90, 90d supply, fill #1

## 2022-01-13 DIAGNOSIS — M0579 Rheumatoid arthritis with rheumatoid factor of multiple sites without organ or systems involvement: Secondary | ICD-10-CM | POA: Diagnosis not present

## 2022-01-20 ENCOUNTER — Other Ambulatory Visit: Payer: Self-pay | Admitting: Family Medicine

## 2022-01-20 ENCOUNTER — Other Ambulatory Visit (HOSPITAL_COMMUNITY): Payer: Self-pay

## 2022-01-20 MED ORDER — FOLIC ACID 1 MG PO TABS
ORAL_TABLET | Freq: Every day | ORAL | 3 refills | Status: DC
  Filled 2022-01-20: qty 90, 90d supply, fill #0
  Filled 2022-04-15: qty 90, 90d supply, fill #1
  Filled 2022-07-12: qty 90, 90d supply, fill #2
  Filled 2022-10-15: qty 90, 90d supply, fill #3

## 2022-01-20 MED ORDER — HYDROCHLOROTHIAZIDE 25 MG PO TABS
ORAL_TABLET | ORAL | 1 refills | Status: DC
  Filled 2022-01-20: qty 90, 90d supply, fill #0
  Filled 2022-11-23: qty 90, 90d supply, fill #1

## 2022-01-24 ENCOUNTER — Ambulatory Visit (INDEPENDENT_AMBULATORY_CARE_PROVIDER_SITE_OTHER): Payer: 59 | Admitting: Family Medicine

## 2022-01-24 ENCOUNTER — Ambulatory Visit: Payer: Self-pay

## 2022-01-24 DIAGNOSIS — M25562 Pain in left knee: Secondary | ICD-10-CM | POA: Diagnosis not present

## 2022-01-24 DIAGNOSIS — M25561 Pain in right knee: Secondary | ICD-10-CM

## 2022-01-24 DIAGNOSIS — M170 Bilateral primary osteoarthritis of knee: Secondary | ICD-10-CM

## 2022-01-24 DIAGNOSIS — G8929 Other chronic pain: Secondary | ICD-10-CM

## 2022-01-24 NOTE — Progress Notes (Signed)
Anais presents to clinic today for Durolane injection bilateral knees   Procedure: Real-time Ultrasound Guided Injection of left knee superior lateral patellar space Device: Philips Affiniti 50G Images permanently stored and available for review in PACS Verbal informed consent obtained.  Discussed risks and benefits of procedure. Warned about infection, bleeding, damage to structures among others. Patient expresses understanding and agreement Time-out conducted.   Noted no overlying erythema, induration, or other signs of local infection.   Skin prepped in a sterile fashion.   Local anesthesia: Topical Ethyl chloride.   With sterile technique and under real time ultrasound guidance: Durolane 3 mL injected into knee joint. Fluid seen entering the joint capsule.   Completed without difficulty   Spinal needle used Advised to call if fevers/chills, erythema, induration, drainage, or persistent bleeding.   Images permanently stored and available for review in the ultrasound unit.  Impression: Technically successful ultrasound guided injection.    Procedure: Real-time Ultrasound Guided Injection of right knee superior lateral patellar space Device: Philips Affiniti 50G Images permanently stored and available for review in PACS Verbal informed consent obtained.  Discussed risks and benefits of procedure. Warned about infection, bleeding, damage to structures among others. Patient expresses understanding and agreement Time-out conducted.   Noted no overlying erythema, induration, or other signs of local infection.   Skin prepped in a sterile fashion.   Local anesthesia: Topical Ethyl chloride.   With sterile technique and under real time ultrasound guidance: Durolane 3 mL injected into knee joint. Fluid seen entering the joint capsule.   Completed without difficulty   Spinal needle used Advised to call if fevers/chills, erythema, induration, drainage, or persistent bleeding.   Images  permanently stored and available for review in the ultrasound unit.  Impression: Technically successful ultrasound guided injection.   Lot number: [id number] for both injections  Return as needed

## 2022-02-09 ENCOUNTER — Other Ambulatory Visit (HOSPITAL_COMMUNITY): Payer: Self-pay

## 2022-02-10 ENCOUNTER — Other Ambulatory Visit (HOSPITAL_COMMUNITY): Payer: Self-pay

## 2022-02-10 ENCOUNTER — Ambulatory Visit (AMBULATORY_SURGERY_CENTER): Payer: Self-pay | Admitting: *Deleted

## 2022-02-10 ENCOUNTER — Ambulatory Visit
Admission: RE | Admit: 2022-02-10 | Discharge: 2022-02-10 | Disposition: A | Discharge status: Home / Self Care | Payer: 59 | Source: Ambulatory Visit | Attending: Family Medicine | Admitting: Family Medicine

## 2022-02-10 VITALS — Ht 62.0 in | Wt 238.0 lb

## 2022-02-10 DIAGNOSIS — M0579 Rheumatoid arthritis with rheumatoid factor of multiple sites without organ or systems involvement: Secondary | ICD-10-CM | POA: Diagnosis not present

## 2022-02-10 DIAGNOSIS — Z1231 Encounter for screening mammogram for malignant neoplasm of breast: Secondary | ICD-10-CM

## 2022-02-10 DIAGNOSIS — Z86010 Personal history of colonic polyps: Secondary | ICD-10-CM

## 2022-02-10 MED ORDER — NA SULFATE-K SULFATE-MG SULF 17.5-3.13-1.6 GM/177ML PO SOLN
1.0000 | ORAL | 0 refills | Status: DC
  Filled 2022-02-10: qty 354, 1d supply, fill #0
  Filled 2022-03-01: qty 354, 2d supply, fill #0

## 2022-02-10 NOTE — Progress Notes (Signed)
Patient is here in-person for PV. Patient denies any allergies to eggs or soy. Patient denies any problems with anesthesia/sedation. Patient is not on any oxygen at home. Patient is not taking any diet/weight loss medications or blood thinners. Went over procedure prep instructions with the patient. Patient is aware of our care-partner policy.

## 2022-02-11 ENCOUNTER — Encounter: Payer: 59 | Admitting: Family Medicine

## 2022-02-18 ENCOUNTER — Other Ambulatory Visit: Payer: 59

## 2022-02-21 ENCOUNTER — Other Ambulatory Visit (HOSPITAL_COMMUNITY): Payer: Self-pay

## 2022-02-22 DIAGNOSIS — M549 Dorsalgia, unspecified: Secondary | ICD-10-CM | POA: Diagnosis not present

## 2022-02-22 DIAGNOSIS — Z79899 Other long term (current) drug therapy: Secondary | ICD-10-CM | POA: Diagnosis not present

## 2022-02-22 DIAGNOSIS — M0579 Rheumatoid arthritis with rheumatoid factor of multiple sites without organ or systems involvement: Secondary | ICD-10-CM | POA: Diagnosis not present

## 2022-02-22 DIAGNOSIS — M797 Fibromyalgia: Secondary | ICD-10-CM | POA: Diagnosis not present

## 2022-02-22 DIAGNOSIS — M79673 Pain in unspecified foot: Secondary | ICD-10-CM | POA: Diagnosis not present

## 2022-02-22 DIAGNOSIS — M8580 Other specified disorders of bone density and structure, unspecified site: Secondary | ICD-10-CM | POA: Diagnosis not present

## 2022-02-22 DIAGNOSIS — M1990 Unspecified osteoarthritis, unspecified site: Secondary | ICD-10-CM | POA: Diagnosis not present

## 2022-02-25 ENCOUNTER — Ambulatory Visit
Admission: RE | Admit: 2022-02-25 | Discharge: 2022-02-25 | Disposition: A | Discharge status: Home / Self Care | Payer: 59 | Source: Ambulatory Visit | Attending: Family Medicine | Admitting: Family Medicine

## 2022-02-25 DIAGNOSIS — M47817 Spondylosis without myelopathy or radiculopathy, lumbosacral region: Secondary | ICD-10-CM | POA: Diagnosis not present

## 2022-02-25 DIAGNOSIS — G8929 Other chronic pain: Secondary | ICD-10-CM

## 2022-02-25 DIAGNOSIS — M5416 Radiculopathy, lumbar region: Secondary | ICD-10-CM

## 2022-02-25 MED ORDER — IOPAMIDOL (ISOVUE-M 200) INJECTION 41%
1.0000 mL | Freq: Once | INTRAMUSCULAR | Status: AC
  Administered 2022-02-25: 1 mL via EPIDURAL

## 2022-02-25 MED ORDER — METHYLPREDNISOLONE ACETATE 40 MG/ML INJ SUSP (RADIOLOG
80.0000 mg | Freq: Once | INTRAMUSCULAR | Status: AC
  Administered 2022-02-25: 80 mg via EPIDURAL

## 2022-02-25 NOTE — Discharge Instructions (Signed)

## 2022-03-01 ENCOUNTER — Other Ambulatory Visit (HOSPITAL_COMMUNITY): Payer: Self-pay

## 2022-03-04 ENCOUNTER — Other Ambulatory Visit: Payer: Self-pay

## 2022-03-04 DIAGNOSIS — E538 Deficiency of other specified B group vitamins: Secondary | ICD-10-CM

## 2022-03-04 DIAGNOSIS — E785 Hyperlipidemia, unspecified: Secondary | ICD-10-CM

## 2022-03-04 DIAGNOSIS — E559 Vitamin D deficiency, unspecified: Secondary | ICD-10-CM

## 2022-03-04 DIAGNOSIS — Z0000 Encounter for general adult medical examination without abnormal findings: Secondary | ICD-10-CM

## 2022-03-07 ENCOUNTER — Encounter: Payer: Self-pay | Admitting: Internal Medicine

## 2022-03-07 ENCOUNTER — Encounter: Payer: Self-pay | Admitting: Family Medicine

## 2022-03-07 ENCOUNTER — Ambulatory Visit (INDEPENDENT_AMBULATORY_CARE_PROVIDER_SITE_OTHER): Payer: 59 | Admitting: Family Medicine

## 2022-03-07 ENCOUNTER — Other Ambulatory Visit (INDEPENDENT_AMBULATORY_CARE_PROVIDER_SITE_OTHER): Payer: 59

## 2022-03-07 VITALS — BP 102/64 | HR 56 | Temp 97.8°F | Ht 62.0 in | Wt 231.8 lb

## 2022-03-07 DIAGNOSIS — J4520 Mild intermittent asthma, uncomplicated: Secondary | ICD-10-CM | POA: Diagnosis not present

## 2022-03-07 DIAGNOSIS — Z0000 Encounter for general adult medical examination without abnormal findings: Secondary | ICD-10-CM

## 2022-03-07 DIAGNOSIS — Z1283 Encounter for screening for malignant neoplasm of skin: Secondary | ICD-10-CM

## 2022-03-07 DIAGNOSIS — E538 Deficiency of other specified B group vitamins: Secondary | ICD-10-CM

## 2022-03-07 DIAGNOSIS — J45909 Unspecified asthma, uncomplicated: Secondary | ICD-10-CM | POA: Insufficient documentation

## 2022-03-07 DIAGNOSIS — R002 Palpitations: Secondary | ICD-10-CM

## 2022-03-07 DIAGNOSIS — E559 Vitamin D deficiency, unspecified: Secondary | ICD-10-CM

## 2022-03-07 DIAGNOSIS — E785 Hyperlipidemia, unspecified: Secondary | ICD-10-CM | POA: Diagnosis not present

## 2022-03-07 LAB — LIPID PANEL
Cholesterol: 183 mg/dL (ref 0–200)
HDL: 76.6 mg/dL (ref >39.00)
LDL Cholesterol: 92 mg/dL (ref 0–99)
NonHDL: 106.53
Total CHOL/HDL Ratio: 2
Triglycerides: 71 mg/dL (ref 0.0–149.0)
VLDL: 14.2 mg/dL (ref 0.0–40.0)

## 2022-03-07 LAB — CBC WITH DIFFERENTIAL/PLATELET
Basophils Absolute: 0.1 10*3/uL (ref 0.0–0.1)
Basophils Relative: 0.7 % (ref 0.0–3.0)
Eosinophils Absolute: 0.2 10*3/uL (ref 0.0–0.7)
Eosinophils Relative: 3.2 % (ref 0.0–5.0)
HCT: 38.5 % (ref 36.0–46.0)
Hemoglobin: 12.5 g/dL (ref 12.0–15.0)
Lymphocytes Relative: 31.1 % (ref 12.0–46.0)
Lymphs Abs: 2.3 10*3/uL (ref 0.7–4.0)
MCHC: 32.4 g/dL (ref 30.0–36.0)
MCV: 84.3 fl (ref 78.0–100.0)
Monocytes Absolute: 0.7 10*3/uL (ref 0.1–1.0)
Monocytes Relative: 9.2 % (ref 3.0–12.0)
Neutro Abs: 4 10*3/uL (ref 1.4–7.7)
Neutrophils Relative %: 55.8 % (ref 43.0–77.0)
Platelets: 200 10*3/uL (ref 150.0–400.0)
RBC: 4.57 Mil/uL (ref 3.87–5.11)
RDW: 14 % (ref 11.5–15.5)
WBC: 7.3 10*3/uL (ref 4.0–10.5)

## 2022-03-07 LAB — COMPREHENSIVE METABOLIC PANEL
ALT: 14 U/L (ref 0–35)
AST: 13 U/L (ref 0–37)
Albumin: 3.9 g/dL (ref 3.5–5.2)
Alkaline Phosphatase: 94 U/L (ref 39–117)
BUN: 17 mg/dL (ref 6–23)
CO2: 29 mEq/L (ref 19–32)
Calcium: 9 mg/dL (ref 8.4–10.5)
Chloride: 98 mEq/L (ref 96–112)
Creatinine, Ser: 0.75 mg/dL (ref 0.40–1.20)
GFR: 87.68 mL/min (ref >60.00)
Glucose, Bld: 85 mg/dL (ref 70–99)
Potassium: 4.2 mEq/L (ref 3.5–5.1)
Sodium: 142 mEq/L (ref 135–145)
Total Bilirubin: 0.4 mg/dL (ref 0.2–1.2)
Total Protein: 6.6 g/dL (ref 6.0–8.3)

## 2022-03-07 LAB — VITAMIN B12: Vitamin B-12: 644 pg/mL (ref 211–911)

## 2022-03-07 LAB — VITAMIN D 25 HYDROXY (VIT D DEFICIENCY, FRACTURES): VITD: 80 ng/mL (ref 30.00–100.00)

## 2022-03-07 MED ORDER — PROAIR RESPICLICK 108 (90 BASE) MCG/ACT IN AEPB
1.0000 | INHALATION_SPRAY | Freq: Four times a day (QID) | RESPIRATORY_TRACT | 3 refills | Status: DC | PRN
  Filled 2022-03-07: qty 1, 50d supply, fill #0

## 2022-03-07 MED ORDER — TRAMADOL HCL 50 MG PO TABS
ORAL_TABLET | ORAL | 1 refills | Status: DC
  Filled 2022-03-07: qty 180, 23d supply, fill #0
  Filled 2022-06-09: qty 180, 23d supply, fill #1

## 2022-03-07 NOTE — Progress Notes (Signed)
Phone [phone]   Subjective:  Patient presents today for their annual physical. Chief complaint-noted.   See problem oriented charting- Review of Systems  Constitutional:  Negative for chills and fever.  HENT:  Negative for congestion, ear discharge and ear pain.   Eyes:  Negative for blurred vision and double vision.  Respiratory:  Negative for cough and shortness of breath.   Cardiovascular:  Positive for palpitations (intermittent) and leg swelling (stable for summer). Negative for chest pain.  Gastrointestinal:  Negative for abdominal pain, blood in stool, constipation, diarrhea, heartburn, melena, nausea and vomiting.  Genitourinary:  Negative for dysuria and frequency.  Musculoskeletal:  Positive for back pain, joint pain and myalgias. Negative for neck pain.  Skin:  Positive for rash (slight facial rash after starting new med but not worsening). Negative for itching.  Neurological:  Negative for dizziness and headaches.  Endo/Heme/Allergies:  Negative for polydipsia. Bruises/bleeds easily.  Psychiatric/Behavioral:  Negative for depression and suicidal ideas.    The following were reviewed and entered/updated in epic: Past Medical History:  Diagnosis Date   Anemia    Asthma    Bradycardia    Chicken pox    Dysrhythmia    occ. palpitations   Fibromyalgia    Headache(784.0)    Migraines    Neck pain    bulging disk   Polyarthralgia 02/21/2019   PONV (postoperative nausea and vomiting)    sometimes wakes up after surgery and cannot breath due to asthma, can be bradycardic   Rheumatoid arthritis involving multiple sites with positive rheumatoid factor (HCC) 02/21/2019   Shortness of breath    with bradycardia   Patient Active Problem List   Diagnosis Date Noted   Immunosuppressed status (HCC) 12/26/2019    Priority: High   Rheumatoid arthritis involving multiple sites with positive rheumatoid factor (HCC) 02/21/2019    Priority: High   Fibromyalgia 04/12/2018     Priority: High   Hyperlipidemia 12/25/2019    Priority: Medium    Vaginal dryness 04/12/2018    Priority: Medium    Morbid obesity (HCC) 04/12/2018    Priority: Medium    History of colon polyps 04/12/2018    Priority: Medium    Primary osteoarthritis of both knees 12/28/2017    Priority: Medium    Mild intermittent asthma 11/20/2013    Priority: Medium    Bradycardia 07/04/2012    Priority: Medium    Vitamin B12 deficiency 02/14/2019    Priority: Low   Vitamin D deficiency 04/12/2018    Priority: Low   History of total hysterectomy 04/12/2018    Priority: Low   Insomnia 04/12/2018    Priority: Low   Cervicogenic headache 03/26/2014    Priority: Low   Cervical radiculopathy 03/26/2014    Priority: Low   Asthma 03/07/2022   Urge incontinence 12/20/2018   Urinary frequency 12/20/2018   Past Surgical History:  Procedure Laterality Date   ABDOMINAL HYSTERECTOMY     BACK SURGERY     cervical fusion C5,6,7 with a bulge at [facility]   BLADDER SURGERY  July 10th 2013   CESAREAN SECTION     1992 and 1995   CHOLECYSTECTOMY N/A 10/07/2021   Procedure: LAPAROSCOPIC CHOLECYSTECTOMY;  Surgeon: Abigail Miyamoto, MD;  Location: [facility];  Service: General;  Laterality: N/A;   COLONOSCOPY  11/27/2015   Dr.Pyrtle   CYSTOSCOPY N/A 07/31/2018   Procedure: Bluford Kaufmann;  Surgeon: Alfredo Martinez, MD;  Location: [facility];  Service: Urology;  Laterality: N/A;  DIAGNOSTIC LAPAROSCOPY     EXCISION OF MESH N/A 07/31/2018   Procedure: REMOVAL OF VAGINAL MESH;  Surgeon: Bjorn Loser, MD;  Location: [facility];  Service: Urology;  Laterality: N/A;   LAPAROSCOPY Bilateral 05/31/2013   Procedure: LAPAROSCOPY withbilateral salpingo-oophorectomy, lysis of adhesions;  Surgeon: Allena Katz, MD;  Location: [facility];  Service: Gynecology;  Laterality: Bilateral;  with clippings of Vaginal  mesh   SALPINGOOPHORECTOMY Bilateral 05/31/2013   Procedure: BILATERAL SALPINGO OOPHORECTOMY/TRIM  MESH;  Surgeon: Allena Katz, MD;  Location: [facility];  Service: Gynecology;  Laterality: Bilateral;   [place] or 1997   WRIST FRACTURE SURGERY  2005    Family History  Problem Relation Age of Onset   Colon polyps Mother    Hyperlipidemia Mother        57 in 2019   Arthritis Mother    Osteoporosis Mother    Lumbar disc disease Mother        herniated discs.    COPD Mother    Hypertension Father        doesnt know his history well- no recent contact   Other Sister        doesnt know history well   Lumbar disc disease Brother        grew up with him    Other Brother        doesnt know history well   Heart disease Maternal Grandmother    Diabetes Maternal Grandmother    Cervical cancer Maternal Grandmother    Colon cancer Maternal Grandfather    Heart disease Maternal Grandfather    Cancer - Colon Maternal Grandfather    Diabetes Paternal Grandmother    Lung cancer Paternal Grandmother        dad's parents stepped in to help when dad not present   Atrial fibrillation Paternal Grandfather        required a few shocks   Bradycardia Paternal Grandfather    Hypertension Paternal Grandfather    Esophageal cancer Neg Hx    Stomach cancer Neg Hx    Rectal cancer Neg Hx     Medications- reviewed and updated Current Outpatient Medications  Medication Sig Dispense Refill   Abatacept (ORENCIA IV) Inject into the vein.     Albuterol Sulfate (PROAIR RESPICLICK) 754 (90 Base) MCG/ACT AEPB Inhale 1 puff into the lungs every 6 (six) hours as needed. 1 each 3   Cholecalciferol (VITAMIN D3) 125 MCG (5000 UT) CAPS Take by mouth daily.     cyanocobalamin 1000 MCG tablet Take 1,000 mcg by mouth daily.     estradiol (ESTRACE) 0.1 MG/GM vaginal cream Insert 1/2 gram into vagina 1 to 3 times per week. Short course before starting tablets back 42.5 g 0   Estradiol 10 MCG TABS vaginal tablet Place 1 tablet (10 mcg total) vaginally 2 (two)  times a week. 24 tablet 3   folic acid (FOLVITE) 1 MG tablet TAKE 1 TABLET BY MOUTH DAILY 90 tablet 3   gabapentin (NEURONTIN) 300 MG capsule Take 1 capsule by mouth each morning and 1 capsule each afternoon and 2 capsules before bed as directed 360 capsule 3   hydrochlorothiazide (HYDRODIURIL) 25 MG tablet TAKE 1 TABLET BY MOUTH DAILY AS NEEDED (FLUID RETENTION/EDEMA). 90 tablet 1   ibuprofen (ADVIL) 400 MG tablet Take 400 mg by mouth in the morning and at bedtime.  leflunomide (ARAVA) 20 MG tablet Take 1 tablet by mouth daily.     levocetirizine (XYZAL) 5 MG tablet Take 1 tablet (5 mg total) by mouth every evening. 90 tablet 1   melatonin 5 MG TABS 1 tablet at bedtime as needed with food     Na Sulfate-K Sulfate-Mg Sulf 17.5-3.13-1.6 GM/177ML SOLN Take 1 kit by mouth as directed. 354 mL 0   ondansetron (ZOFRAN-ODT) 4 MG disintegrating tablet Take 1 tablet (4 mg total) by mouth every 8 (eight) hours as needed for nausea or vomiting. 20 tablet 0   predniSONE (DELTASONE) 10 MG tablet TAKE 1 TABLET BY MOUTH DAILY AS NEEDED 30 tablet 1   traMADol (ULTRAM) 50 MG tablet Take 1 to 2 tablets by mouth every 4-6 hours as needed for moderate pain (max of 8 per day). 180 tablet 1   Turmeric 500 MG CAPS Take by mouth 2 (two) times daily.     valACYclovir (VALTREX) 1000 MG tablet Take 1 tablet (1,000 mg total) by mouth 2 (two) times daily. 30 tablet 0   vitamin C (ASCORBIC ACID) 500 MG tablet Take 500 mg by mouth as needed.     Zinc Sulfate (ZINC 15 PO) Take 30 mg by mouth.     No current facility-administered medications for this visit.    Allergies-reviewed and updated Allergies  Allergen Reactions   Advair Diskus [Fluticasone-Salmeterol] Cough    excessive   Erythromycin Diarrhea and Nausea And Vomiting   Percocet [Oxycodone-Acetaminophen] Other (See Comments)    hallucinations    Social History   Social History Narrative   Married to husband Shanon Brow. Son Gerald Stabs and Daughter Reggy Eye.        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       Hobbies: movies, reading, hiking   Right handed   Objective  Objective:  BP 102/64   Pulse (!) 56   Temp 97.8 F (36.6 C)   Ht $R'5\' 2"'qb$  (1.575 m)   Wt 231 lb 12.8 oz (105.1 kg)   SpO2 98%   BMI 42.40 kg/m  Gen: NAD, resting comfortably HEENT: Mucous membranes are moist. Oropharynx normal Neck: no thyromegaly CV: mildly bradycardic but regular no murmurs rubs or gallops Lungs: CTAB no crackles, wheeze, rhonchi Abdomen: soft/nontender/nondistended/normal bowel sounds. No rebound or guarding.  Ext: no edema Skin: warm, dry Neuro: grossly normal, moves all extremities, PERRLA  EKG: sinus rhythm with rate 60, normal axis, normal intervals, no hypertrophy, no st or t wave changes    Assessment and Plan   56 y.o. female presenting for annual physical.  Health Maintenance counseling: 1. Anticipatory guidance: Patient counseled regarding regular dental exams -q6 months, eye exams - yearly in september,  avoiding smoking and second hand smoke , limiting alcohol to 1 beverage per day- rare social , no illicit drugs .   2. Risk factor reduction:  Advised patient of need for regular exercise and diet rich and fruits and vegetables to reduce risk of heart attack and stroke.  Exercise- since feeling better wants to trial treadmill at home.  Diet/weight management-down 14 lbs from last CPE- nice gradual weight loss- watching dietary intake. . Morbid obesity noted but improving as are labs!  Wt Readings from Last 3 Encounters:  03/07/22 231 lb 12.8 oz (105.1 kg)  02/10/22 238 lb (108 kg)  12/30/21 235 lb (106.6 kg)  3. Immunizations/screenings/ancillary studies- fall flu shot and will let us know as well as consider covid vaccine when released ( she wants to  hold off) - did ok with covid last September- even without medication Immunization History  Administered Date(s) Administered   Influenza,inj,Quad PF,6+ Mos 04/24/2018   Influenza-Unspecified  05/02/2015, 05/04/2016, 05/08/2017, 05/03/2019, 05/22/2020, 05/07/2021   PFIZER(Purple Top)SARS-COV-2 Vaccination 07/24/2019, 08/12/2019, 08/08/2020   Tdap 08/26/2015  4. Cervical cancer screening- hysterectomy for benign reasons- was told did not require pap smears- if had issues could see Dr. Talbert Nan again 5. Breast cancer screening-  breast exam - prefers self exams- and mammogram 02/10/22 3d  6. Colon cancer screening - scheduled Friday with Dr. Hilarie Fredrickson- prep ready to go at home 7. Skin cancer screening- no dermatologist- wants referral. advised regular sunscreen use. Denies worrisome, changing, or new skin lesions.  8. Birth control/STD check- hysterectomy and monogomous 9. Osteoporosis screening at 53- 05/20/21- with long term prednisone- will check every 2 years did have osteopenia- takes vitamin D 10,000 units daily also gets fair amount of calcium in diet 10. Smoking associated screening - never smoker  Status of chronic or acute concerns   # rheumatoid arthritis #fibromyalgia/OA S:following closely with rheumatology Dr. Kathlene November- thankfully has been doing better lately currently on  prednisone 10 mg just as needed- hasnt used in a while , lefunomide 20 mg once daily,  orencia IV once a month - her sed rate even came down to 24 -fibro and osteo also contribute- for pain management gabapentin 300mg  1 in am, 1 in pm and 2 before bed. Also tramadola nd needs refill today- 1 or 2 at bedtime depends on how her back is hurting A/P: about the best she has been controlled despite multiple drug regimens  -thrilled she is doing well on current regimen- continue this and follow up with Dr. Kathlene November -for pain side refill tramadol today  #HRT- remains on estradiol vaginally- cream and tablets available - mainly does well with the tablets as long as doesn't miss  #edema- as needed hctz 2-3 days a week  #cold sores- prn valtrex per Sula Soda, PA  #hyperlipidemia S: Medication:none  The 10-year  ASCVD risk score (Arnett DK, et al., 2019) is: 1.2% -significant dietary improvements and lipids continue to trend downg Lab Results  Component Value Date   CHOL 183 03/07/2022   HDL 76.60 03/07/2022   LDLCALC 92 03/07/2022   TRIG 71.0 03/07/2022   CHOLHDL 2 03/07/2022   A/P: low 10 year ascvd risk under 5%- continue to monitor - sparing palpitations- would like to update ekg  #B12 relative deficiency-has done well with B12 daily and B12 now over 400 for the first time in several years  #Vitamin D deficiency- much better on 10k units per day- trial 5 k units per day as long as stable- just want to avoid high D   #palpitations- intermittent can be once a week twice a week maximum- update ekg- last 2019- short lived and not long enough that sh would like to wear monitor  Recommended follow up: Return in about 1 year (around 03/08/2023) for physical or sooner if needed.Schedule b4 you leave. Future Appointments  Date Time Provider [facility]  03/11/2022  9:30 AM Pyrtle, Lajuan Lines, MD [facility]   Lab/Order associations:already had labs   ICD-10-CM   1. Preventative health care  Z00.00     2. Screening exam for skin cancer  Z12.83 Ambulatory referral to Dermatology    3. Palpitations  R00.2 EKG 12-Lead    CANCELED: EKG 12-Lead    4. Mild intermittent asthma without complication  V74.73  Meds ordered this encounter  Medications   traMADol (ULTRAM) 50 MG tablet    Sig: Take 1 to 2 tablets by mouth every 4-6 hours as needed for moderate pain (max of 8 per day).    Dispense:  180 tablet    Refill:  1   Albuterol Sulfate (PROAIR RESPICLICK) 357 (90 Base) MCG/ACT AEPB    Sig: Inhale 1 puff into the lungs every 6 (six) hours as needed.    Dispense:  1 each    Refill:  3    Return precautions advised.  Garret Reddish, MD

## 2022-03-07 NOTE — Patient Instructions (Addendum)
Flu shot- we should have these available within a month or two but please let us know if you get at outside pharmacy  Chat with Bevelyn Ngo and get your prevnar 20 when able  Also chat with keba and see if she can release ekg we ordered today  Labs look great- thanks for doing these!   Recommended follow up: Return in about 1 year (around 03/08/2023) for physical or sooner if needed.Schedule b4 you leave.

## 2022-03-08 ENCOUNTER — Other Ambulatory Visit: Payer: Self-pay | Admitting: Family Medicine

## 2022-03-08 ENCOUNTER — Other Ambulatory Visit (HOSPITAL_COMMUNITY): Payer: Self-pay

## 2022-03-08 MED ORDER — PROAIR RESPICLICK 108 (90 BASE) MCG/ACT IN AEPB
1.0000 | INHALATION_SPRAY | Freq: Four times a day (QID) | RESPIRATORY_TRACT | 3 refills | Status: DC | PRN
  Filled 2022-03-08: qty 1, 50d supply, fill #0

## 2022-03-10 DIAGNOSIS — M0579 Rheumatoid arthritis with rheumatoid factor of multiple sites without organ or systems involvement: Secondary | ICD-10-CM | POA: Diagnosis not present

## 2022-03-11 ENCOUNTER — Encounter: Payer: Self-pay | Admitting: Internal Medicine

## 2022-03-11 ENCOUNTER — Ambulatory Visit (AMBULATORY_SURGERY_CENTER): Payer: 59 | Admitting: Internal Medicine

## 2022-03-11 VITALS — BP 140/77 | HR 53 | Temp 96.4°F | Resp 9 | Ht 62.5 in | Wt 238.0 lb

## 2022-03-11 DIAGNOSIS — K620 Anal polyp: Secondary | ICD-10-CM

## 2022-03-11 DIAGNOSIS — D123 Benign neoplasm of transverse colon: Secondary | ICD-10-CM | POA: Diagnosis not present

## 2022-03-11 DIAGNOSIS — I10 Essential (primary) hypertension: Secondary | ICD-10-CM | POA: Diagnosis not present

## 2022-03-11 DIAGNOSIS — K635 Polyp of colon: Secondary | ICD-10-CM

## 2022-03-11 DIAGNOSIS — Z86010 Personal history of colonic polyps: Secondary | ICD-10-CM

## 2022-03-11 DIAGNOSIS — Z800 Family history of malignant neoplasm of digestive organs: Secondary | ICD-10-CM | POA: Diagnosis not present

## 2022-03-11 DIAGNOSIS — Z09 Encounter for follow-up examination after completed treatment for conditions other than malignant neoplasm: Secondary | ICD-10-CM

## 2022-03-11 DIAGNOSIS — D128 Benign neoplasm of rectum: Secondary | ICD-10-CM

## 2022-03-11 DIAGNOSIS — Z1211 Encounter for screening for malignant neoplasm of colon: Secondary | ICD-10-CM | POA: Diagnosis not present

## 2022-03-11 MED ORDER — SODIUM CHLORIDE 0.9 % IV SOLN: 500.0000 mL | Freq: Once | INTRAVENOUS | Status: DC

## 2022-03-11 NOTE — Progress Notes (Signed)
Pt's states no medical or surgical changes since previsit or office visit. 

## 2022-03-11 NOTE — Op Note (Signed)
[facility] Patient Name: Nancy [name] Procedure Date: 03/11/2022 9:30 AM MRN: [id number] Endoscopist: Jerene Bears , MD Age: 56 Referring MD:  Date of Birth: 1966-01-18 Gender: Female Account #: [id number] Procedure:                Colonoscopy Indications:              High risk colon cancer surveillance: Personal                            history of sessile serrated colon polyp (less than                            10 mm in size) with no dysplasia, Family history of                            advanced adenoma of the colon in a first-degree                            relative before age 51 years (mother) and colon                            cancer in maternal grandfather, Last colonoscopy:                            April 2017 Medicines:                Monitored Anesthesia Care Procedure:                Pre-Anesthesia Assessment:                           - Prior to the procedure, a History and Physical                            was performed, and patient medications and                            allergies were reviewed. The patient's tolerance of                            previous anesthesia was also reviewed. The risks                            and benefits of the procedure and the sedation                            options and risks were discussed with the patient.                            All questions were answered, and informed consent                            was obtained. Prior Anticoagulants: The patient has  taken no previous anticoagulant or antiplatelet                            agents. ASA Grade Assessment: III - A patient with                            severe systemic disease. After reviewing the risks                            and benefits, the patient was deemed in                            satisfactory condition to undergo the procedure.                           After obtaining informed consent, the colonoscope                             was passed under direct vision. Throughout the                            procedure, the patient's blood pressure, pulse, and                            oxygen saturations were monitored continuously. The                            CF [id number] #[id number] was introduced through the anus                            and advanced to the cecum, identified by                            appendiceal orifice and ileocecal valve. The                            colonoscopy was performed without difficulty. The                            patient tolerated the procedure well. The quality                            of the bowel preparation was excellent. The                            ileocecal valve, appendiceal orifice, and rectum                            were photographed. Scope In: 9:39:59 AM Scope Out: 9:52:38 AM Scope Withdrawal Time: 0 hours 10 minutes 46 seconds  Total Procedure Duration: 0 hours 12 minutes 39 seconds  Findings:                 The digital rectal exam was normal.  A 6 mm polyp was found in the transverse colon. The                            polyp was sessile. The polyp was removed with a                            cold snare. Resection and retrieval were complete.                           A 3-4 mm polyp was found in the distal rectum. The                            polyp was sessile. The polyp was removed with a                            cold biopsy forceps. Resection and retrieval were                            complete.                           No additional abnormalities were found on                            retroflexion. Complications:            No immediate complications. Estimated Blood Loss:     Estimated blood loss was minimal. Impression:               - One 6 mm polyp in the transverse colon, removed                            with a cold snare. Resected and retrieved.                           - One 3-4 mm polyp  in the distal rectum, removed                            with a cold biopsy forceps. Resected and retrieved. Recommendation:           - Patient has a contact number available for                            emergencies. The signs and symptoms of potential                            delayed complications were discussed with the                            patient. Return to normal activities tomorrow.                            Written discharge instructions were provided to the  patient.                           - Resume previous diet.                           - Continue present medications.                           - Await pathology results.                           - Repeat colonoscopy is recommended for                            surveillance. The colonoscopy date will be                            determined after pathology results from today's                            exam become available for review. Jerene Bears, MD 03/11/2022 10:04:07 AM This report has been signed electronically.

## 2022-03-11 NOTE — Patient Instructions (Addendum)
Information on polyps given to you today.  Await pathology results.  Resume previous diet and medications.   YOU HAD AN ENDOSCOPIC PROCEDURE TODAY AT [facility]:   Refer to the procedure report that was given to you for any specific questions about what was found during the examination.  If the procedure report does not answer your questions, please call your gastroenterologist to clarify.  If you requested that your care partner not be given the details of your procedure findings, then the procedure report has been included in a sealed envelope for you to review at your convenience later.  YOU SHOULD EXPECT: Some feelings of bloating in the abdomen. Passage of more gas than usual.  Walking can help get rid of the air that was put into your GI tract during the procedure and reduce the bloating. If you had a lower endoscopy (such as a colonoscopy or flexible sigmoidoscopy) you may notice spotting of blood in your stool or on the toilet paper. If you underwent a bowel prep for your procedure, you may not have a normal bowel movement for a few days.  Please Note:  You might notice some irritation and congestion in your nose or some drainage.  This is from the oxygen used during your procedure.  There is no need for concern and it should clear up in a day or so.  SYMPTOMS TO REPORT IMMEDIATELY:  Following lower endoscopy (colonoscopy or flexible sigmoidoscopy):  Excessive amounts of blood in the stool  Significant tenderness or worsening of abdominal pains  Swelling of the abdomen that is new, acute  Fever of 100F or higher   For urgent or emergent issues, a gastroenterologist can be reached at any hour by calling [phone]. Do not use MyChart messaging for urgent concerns.    DIET:  We do recommend a small meal at first, but then you may proceed to your regular diet.  Drink plenty of fluids but you should avoid alcoholic beverages for 24 hours.  ACTIVITY:  You should  plan to take it easy for the rest of today and you should NOT DRIVE or use heavy machinery until tomorrow (because of the sedation medicines used during the test).    FOLLOW UP: Our staff will call the number listed on your records the next business day following your procedure.  We will call around 7:15- 8:00 am to check on you and address any questions or concerns that you may have regarding the information given to you following your procedure. If we do not reach you, we will leave a message.  If you develop any symptoms (ie: fever, flu-like symptoms, shortness of breath, cough etc.) before then, please call [phone].  If you test positive for Covid 19 in the 2 weeks post procedure, please call and report this information to [place].    If any biopsies were taken you will be contacted by phone or by letter within the next 1-3 weeks.  Please call us at [phone] if you have not heard about the biopsies in 3 weeks.    SIGNATURES/CONFIDENTIALITY: You and/or your care partner have signed paperwork which will be entered into your electronic medical record.  These signatures attest to the fact that that the information above on your After Visit Summary has been reviewed and is understood.  Full responsibility of the confidentiality of this discharge information lies with you and/or your care-partner.

## 2022-03-11 NOTE — Progress Notes (Signed)
Report to PACU, RN, vss, BBS= Clear.  

## 2022-03-11 NOTE — Progress Notes (Signed)
GASTROENTEROLOGY PROCEDURE H&P NOTE   Primary Care Physician: Marin Olp, MD    Reason for Procedure:  History of SSP  Plan:    Surveillance colonoscopy  Patient is appropriate for endoscopic procedure(s) in the ambulatory ([place]) setting.  The nature of the procedure, as well as the risks, benefits, and alternatives were carefully and thoroughly reviewed with the patient. Ample time for discussion and questions allowed. The patient understood, was satisfied, and agreed to proceed.     HPI: Nancy [name] is a 56 y.o. female who presents for surveillance colonoscopy.  Medical history as below.  Tolerated the prep.  No recent chest pain or shortness of breath.  No abdominal pain today.  Past Medical History:  Diagnosis Date   Anemia    Asthma    Bradycardia    Chicken pox    Dysrhythmia    occ. palpitations   Fibromyalgia    Headache(784.0)    Migraines    Neck pain    bulging disk   Polyarthralgia 02/21/2019   PONV (postoperative nausea and vomiting)    sometimes wakes up after surgery and cannot breath due to asthma, can be bradycardic   Rheumatoid arthritis involving multiple sites with positive rheumatoid factor ([place]) 02/21/2019   Shortness of breath    with bradycardia    Past Surgical History:  Procedure Laterality Date   ABDOMINAL HYSTERECTOMY     BACK SURGERY     cervical fusion C5,6,7 with a bulge at [place]  July 10th 2013   [place] N/A 10/07/2021   Procedure: LAPAROSCOPIC CHOLECYSTECTOMY;  Surgeon: Coralie Keens, MD;  Location: [facility];  Service: General;  Laterality: N/A;   COLONOSCOPY  11/27/2015   Dr.Carreen Milius   CYSTOSCOPY N/A 07/31/2018   Procedure: Consuela Mimes;  Surgeon: Bjorn Loser, MD;  Location: [facility];  Service: Urology;  Laterality: N/A;   DIAGNOSTIC LAPAROSCOPY     EXCISION OF MESH N/A 07/31/2018   Procedure: REMOVAL OF VAGINAL MESH;  Surgeon:  Bjorn Loser, MD;  Location: [facility];  Service: Urology;  Laterality: N/A;   LAPAROSCOPY Bilateral 05/31/2013   Procedure: LAPAROSCOPY withbilateral salpingo-oophorectomy, lysis of adhesions;  Surgeon: Allena Katz, MD;  Location: [facility];  Service: Gynecology;  Laterality: Bilateral;  with clippings of Vaginal  mesh   SALPINGOOPHORECTOMY Bilateral 05/31/2013   Procedure: BILATERAL SALPINGO OOPHORECTOMY/TRIM MESH;  Surgeon: Allena Katz, MD;  Location: [facility];  Service: Gynecology;  Laterality: Bilateral;   [place] or [place]  2005    Prior to Admission medications   Medication Sig Start Date End Date Taking? Authorizing Provider  Abatacept (ORENCIA IV) Inject into the vein.   Yes [provider]  folic acid (FOLVITE) 1 MG tablet TAKE 1 TABLET BY MOUTH DAILY 01/20/22 01/20/23 Yes Marin Olp, MD  gabapentin (NEURONTIN) 300 MG capsule Take 1 capsule by mouth each morning and 1 capsule each afternoon and 2 capsules before bed as directed 12/15/21 12/15/22 Yes Marin Olp, MD  hydrochlorothiazide (HYDRODIURIL) 25 MG tablet TAKE 1 TABLET BY MOUTH DAILY AS NEEDED (FLUID RETENTION/EDEMA). 01/20/22 01/20/23 Yes Marin Olp, MD  leflunomide (ARAVA) 20 MG tablet Take 1 tablet by mouth daily. 06/22/21  Yes [provider]  traMADol (ULTRAM) 50 MG tablet Take 1 to  2 tablets by mouth every 4-6 hours as needed for moderate pain (max of 8 per day). 03/07/22  Yes Marin Olp, MD  Turmeric 500 MG CAPS Take by mouth 2 (two) times daily.   Yes [provider]  vitamin C (ASCORBIC ACID) 500 MG tablet Take 500 mg by mouth as needed.   Yes [provider]  Zinc Sulfate (ZINC 15 PO) Take 30 mg by mouth.   Yes [provider]  Albuterol Sulfate (PROAIR RESPICLICK) 654 (90 Base) MCG/ACT AEPB Inhale 1 puff into the lungs every 6 (six) hours as needed. 03/08/22   Marin Olp, MD  Cholecalciferol (VITAMIN D3) 125 MCG (5000 UT) CAPS Take by mouth daily.    [provider]  cyanocobalamin 1000 MCG tablet Take 1,000 mcg by mouth daily.    [provider]  estradiol (ESTRACE) 0.1 MG/GM vaginal cream Insert 1/2 gram into vagina 1 to 3 times per week. Short course before starting tablets back 12/15/21   Marin Olp, MD  Estradiol 10 MCG TABS vaginal tablet Place 1 tablet (10 mcg total) vaginally 2 (two) times a week. 12/31/20   Marin Olp, MD  ibuprofen (ADVIL) 400 MG tablet Take 400 mg by mouth in the morning and at bedtime.    [provider]  levocetirizine (XYZAL) 5 MG tablet Take 1 tablet (5 mg total) by mouth every evening. 01/11/22   Marin Olp, MD  melatonin 5 MG TABS 1 tablet at bedtime as needed with food    [provider]  ondansetron (ZOFRAN-ODT) 4 MG disintegrating tablet Take 1 tablet (4 mg total) by mouth every 8 (eight) hours as needed for nausea or vomiting. 08/30/21   Marin Olp, MD  predniSONE (DELTASONE) 10 MG tablet TAKE 1 TABLET BY MOUTH DAILY AS NEEDED 08/03/21 08/03/22  Inda Coke, PA  valACYclovir (VALTREX) 1000 MG tablet Take 1 tablet (1,000 mg total) by mouth 2 (two) times daily. 05/25/21   Inda Coke, PA    Current Outpatient Medications  Medication Sig Dispense Refill   Abatacept (ORENCIA IV) Inject into the vein.     folic acid (FOLVITE) 1 MG tablet TAKE 1 TABLET BY MOUTH DAILY 90 tablet 3   gabapentin (NEURONTIN) 300 MG capsule Take 1 capsule by mouth each morning and 1 capsule each afternoon and 2 capsules before bed as directed 360 capsule 3   hydrochlorothiazide (HYDRODIURIL) 25 MG tablet TAKE 1 TABLET BY MOUTH DAILY AS NEEDED (FLUID RETENTION/EDEMA). 90 tablet 1   leflunomide (ARAVA) 20 MG tablet Take 1 tablet by mouth daily.     traMADol (ULTRAM) 50 MG tablet Take 1 to 2 tablets by mouth every 4-6 hours as needed for moderate pain (max of 8 per day). 180 tablet 1    Turmeric 500 MG CAPS Take by mouth 2 (two) times daily.     vitamin C (ASCORBIC ACID) 500 MG tablet Take 500 mg by mouth as needed.     Zinc Sulfate (ZINC 15 PO) Take 30 mg by mouth.     Albuterol Sulfate (PROAIR RESPICLICK) 650 (90 Base) MCG/ACT AEPB Inhale 1 puff into the lungs every 6 (six) hours as needed. 1 each 3   Cholecalciferol (VITAMIN D3) 125 MCG (5000 UT) CAPS Take by mouth daily.     cyanocobalamin 1000 MCG tablet Take 1,000 mcg by mouth daily.     estradiol (ESTRACE) 0.1 MG/GM vaginal cream Insert 1/2 gram into vagina 1 to 3 times per week.  Short course before starting tablets back 42.5 g 0   Estradiol 10 MCG TABS vaginal tablet Place 1 tablet (10 mcg total) vaginally 2 (two) times a week. 24 tablet 3   ibuprofen (ADVIL) 400 MG tablet Take 400 mg by mouth in the morning and at bedtime.     levocetirizine (XYZAL) 5 MG tablet Take 1 tablet (5 mg total) by mouth every evening. 90 tablet 1   melatonin 5 MG TABS 1 tablet at bedtime as needed with food     ondansetron (ZOFRAN-ODT) 4 MG disintegrating tablet Take 1 tablet (4 mg total) by mouth every 8 (eight) hours as needed for nausea or vomiting. 20 tablet 0   predniSONE (DELTASONE) 10 MG tablet TAKE 1 TABLET BY MOUTH DAILY AS NEEDED 30 tablet 1   valACYclovir (VALTREX) 1000 MG tablet Take 1 tablet (1,000 mg total) by mouth 2 (two) times daily. 30 tablet 0   Current Facility-Administered Medications  Medication Dose Route Frequency Provider Last Rate Last Admin   0.9 %  sodium chloride infusion  500 mL Intravenous Once Kailee Essman, Lajuan Lines, MD        Allergies as of 03/11/2022 - Review Complete 03/11/2022  Allergen Reaction Noted   Advair diskus [fluticasone-salmeterol] Cough 05/08/2013   Erythromycin Diarrhea and Nausea And Vomiting 07/04/2012   Percocet [oxycodone-acetaminophen] Other (See Comments) 07/04/2012    Family History  Problem Relation Age of Onset   Colon polyps Mother    Hyperlipidemia Mother        7 in 2019    Arthritis Mother    Osteoporosis Mother    Lumbar disc disease Mother        herniated discs.    COPD Mother    Hypertension Father        doesnt know his history well- no recent contact   Other Sister        doesnt know history well   Lumbar disc disease Brother        grew up with him    Other Brother        doesnt know history well   Heart disease Maternal Grandmother    Diabetes Maternal Grandmother    Cervical cancer Maternal Grandmother    Colon cancer Maternal Grandfather    Heart disease Maternal Grandfather    Cancer - Colon Maternal Grandfather    Diabetes Paternal Grandmother    Lung cancer Paternal Grandmother        dad's parents stepped in to help when dad not present   Atrial fibrillation Paternal Grandfather        required a few shocks   Bradycardia Paternal Grandfather    Hypertension Paternal Grandfather    Esophageal cancer Neg Hx    Stomach cancer Neg Hx    Rectal cancer Neg Hx     Social History   Socioeconomic History   Marital status: Married    Spouse name: Not on file   Number of children: Not on file   Years of education: Not on file   Highest education level: Not on file  Occupational History   Not on file  Tobacco Use   Smoking status: Never   Smokeless tobacco: Never  Vaping Use   Vaping Use: Never used  Substance and Sexual Activity   Alcohol use: Not Currently    Comment: occasional; just on special occasion   Drug use: No   Sexual activity: Yes    Partners: Male    Birth control/protection: Surgical  Other Topics Concern   Not on file  Social History Narrative   Married to husband Shanon Brow. Son Gerald Stabs and Daughter Reggy Eye.       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       Hobbies: movies, reading, hiking   Right handed   Social Determinants of Health   Financial Resource Strain: Not on file  Food Insecurity: Not on file  Transportation Needs: Not on file  Physical Activity: Not on file  Stress: Not on file  Social  Connections: Not on file  Intimate Partner Violence: Not on file    Physical Exam: Vital signs in last 24 hours: '@BP'$  (!) 142/71   Pulse 61   Temp (!) 96.4 F (35.8 C) (Temporal)   Ht 5' 2.5" (1.588 m)   Wt 238 lb (108 kg)   SpO2 98%   BMI 42.84 kg/m  GEN: NAD EYE: Sclerae anicteric ENT: MMM CV: Non-tachycardic Pulm: CTA b/l GI: Soft, NT/ND NEURO:  Alert & Oriented x 3   Zenovia Jarred, MD [facility]  03/11/2022 9:27 AM

## 2022-03-11 NOTE — Progress Notes (Signed)
Called to room to assist during endoscopic procedure.  Patient ID and intended procedure confirmed with present staff. Received instructions for my participation in the procedure from the performing physician.  

## 2022-03-12 ENCOUNTER — Other Ambulatory Visit (HOSPITAL_COMMUNITY): Payer: Self-pay

## 2022-03-12 ENCOUNTER — Other Ambulatory Visit: Payer: Self-pay | Admitting: Family Medicine

## 2022-03-14 ENCOUNTER — Telehealth: Payer: Self-pay

## 2022-03-14 ENCOUNTER — Other Ambulatory Visit (HOSPITAL_COMMUNITY): Payer: Self-pay

## 2022-03-14 MED ORDER — LEFLUNOMIDE 20 MG PO TABS
20.0000 mg | ORAL_TABLET | Freq: Every day | ORAL | 3 refills | Status: DC
  Filled 2022-03-14: qty 90, 90d supply, fill #0
  Filled 2022-06-09: qty 90, 90d supply, fill #1
  Filled 2022-09-07 – 2022-09-12 (×2): qty 90, 90d supply, fill #2
  Filled 2022-12-05 – 2022-12-14 (×2): qty 90, 90d supply, fill #3

## 2022-03-14 NOTE — Telephone Encounter (Signed)
  Follow up Call-     03/11/2022    8:41 AM  Call back number  Post procedure Call Back phone  # [phone]  Permission to leave phone message Yes     Patient questions:  Do you have a fever, pain , or abdominal swelling? No. Pain Score  0 *  Have you tolerated food without any problems? Yes.    Have you been able to return to your normal activities? Yes.    Do you have any questions about your discharge instructions: Diet   No. Medications  No. Follow up visit  No.  Do you have questions or concerns about your Care? No.  Actions: * If pain score is 4 or above: No action needed, pain <4.

## 2022-03-15 ENCOUNTER — Encounter: Payer: Self-pay | Admitting: Internal Medicine

## 2022-03-15 ENCOUNTER — Telehealth: Payer: Self-pay | Admitting: Family Medicine

## 2022-03-15 ENCOUNTER — Other Ambulatory Visit (HOSPITAL_COMMUNITY): Payer: Self-pay

## 2022-03-15 NOTE — Telephone Encounter (Signed)
Noted  

## 2022-03-15 NOTE — Telephone Encounter (Signed)
.  Type of form received: FMLA  Additional comments:   Received by: Adonis Brook  Form should be Faxed to: [phone]  Form should be mailed to:    Is patient requesting call for pickup:   Form placed:  In provider's box  Attach charge sheet. yes  Individual made aware of 3-5 business day turn around (Y/N)?

## 2022-04-07 DIAGNOSIS — M0579 Rheumatoid arthritis with rheumatoid factor of multiple sites without organ or systems involvement: Secondary | ICD-10-CM | POA: Diagnosis not present

## 2022-04-14 ENCOUNTER — Other Ambulatory Visit: Payer: Self-pay

## 2022-04-14 ENCOUNTER — Other Ambulatory Visit (HOSPITAL_COMMUNITY): Payer: Self-pay

## 2022-04-14 DIAGNOSIS — H5203 Hypermetropia, bilateral: Secondary | ICD-10-CM | POA: Diagnosis not present

## 2022-04-14 DIAGNOSIS — H524 Presbyopia: Secondary | ICD-10-CM | POA: Diagnosis not present

## 2022-04-14 MED ORDER — ALBUTEROL SULFATE HFA 108 (90 BASE) MCG/ACT IN AERS
2.0000 | INHALATION_SPRAY | Freq: Four times a day (QID) | RESPIRATORY_TRACT | 3 refills | Status: DC | PRN
  Filled 2022-04-14: qty 6.7, 25d supply, fill #0

## 2022-04-15 ENCOUNTER — Other Ambulatory Visit (HOSPITAL_COMMUNITY): Payer: Self-pay

## 2022-04-25 ENCOUNTER — Encounter: Payer: Self-pay | Admitting: *Deleted

## 2022-05-05 DIAGNOSIS — L820 Inflamed seborrheic keratosis: Secondary | ICD-10-CM | POA: Diagnosis not present

## 2022-05-05 DIAGNOSIS — D1800 Hemangioma unspecified site: Secondary | ICD-10-CM | POA: Diagnosis not present

## 2022-05-05 DIAGNOSIS — L209 Atopic dermatitis, unspecified: Secondary | ICD-10-CM | POA: Diagnosis not present

## 2022-05-05 DIAGNOSIS — M0579 Rheumatoid arthritis with rheumatoid factor of multiple sites without organ or systems involvement: Secondary | ICD-10-CM | POA: Diagnosis not present

## 2022-05-25 NOTE — Progress Notes (Signed)
Nancy [name] is a 56 y.o. female who presents to [place] at [facility] today for back and knee pain follow up. Patient was last seen by Dr. Georgina Snell on 01/24/22 for bilateral knee and had Durolane injections done. Today patient reports that the knees were doing well until about a month ago L>R. Low back pain is back with the sciatica pain that radiates down the left leg down to the foot. The pain actually stays down the leg, she will stand on her right leg more to compensate for the pain. The pain is worse than before and thinks she is due for another epidural.  Her previous epidural steroid injection was July of this year.  It was moderately helpful.  Pain is located in the anterior thigh and into the lateral calf.   Pertinent review of systems: No fevers or chills  Relevant historical information: Rheumatoid arthritis   Exam:  BP 118/78   Pulse 70   Ht 5' 2.5" (1.588 m)   Wt 228 lb (103.4 kg)   SpO2 95%   BMI 41.04 kg/m  General: Well Developed, well nourished, and in no acute distress.   MSK: Knees bilaterally moderate effusion normal motion with crepitation.    L-spine: Decreased lumbar motion.  Positive slump test.    Lab and Radiology Results  Zilretta injection bilateral knees Procedure: Real-time Ultrasound Guided Injection of right knee superior lateral patellar space Device: Philips Affiniti 50G Images permanently stored and available for review in PACS Verbal informed consent obtained.  Discussed risks and benefits of procedure. Warned about infection, bleeding, hyperglycemia damage to structures among others. Patient expresses understanding and agreement Time-out conducted.   Noted no overlying erythema, induration, or other signs of local infection.   Skin prepped in a sterile fashion.   Local anesthesia: Topical Ethyl chloride.   With sterile technique and under real time ultrasound guidance: Zilretta 32 mg injected into knee joint.  Fluid seen entering the joint capsule.   Completed without difficulty   Advised to call if fevers/chills, erythema, induration, drainage, or persistent bleeding.   Images permanently stored and available for review in the ultrasound unit.  Impression: Technically successful ultrasound guided injection.    Procedure: Real-time Ultrasound Guided Injection of left knee superior lateral patellar space Device: Philips Affiniti 50G Images permanently stored and available for review in PACS Verbal informed consent obtained.  Discussed risks and benefits of procedure. Warned about infection, bleeding, hyperglycemia damage to structures among others. Patient expresses understanding and agreement Time-out conducted.   Noted no overlying erythema, induration, or other signs of local infection.   Skin prepped in a sterile fashion.   Local anesthesia: Topical Ethyl chloride.   With sterile technique and under real time ultrasound guidance: Zilretta 32 mg injected into knee joint. Fluid seen entering the joint space.   Completed without difficulty   Advised to call if fevers/chills, erythema, induration, drainage, or persistent bleeding.   Images permanently stored and available for review in the ultrasound unit.  Impression: Technically successful ultrasound guided injection.  Lot number: [id number]     Assessment and Plan: 56 y.o. female with bilateral knee pain due to exacerbation of arthritis.  Plan for Zilretta injection today.  Prior knee injection was hyaluronic acid injection about 4 months ago.  We could proceed with repeat Durolane or similar injection at the very end of the year early 2024.  She has very bothersome lumbar radiculopathy that is not improving despite epidural steroid injection  trials.  Her MRI is from June 2022.  We will update MRI to better understand location of pain.  Her current pain is more consistent with left L3 and L4 lumbar radiculopathy.  Her symptoms do not  perfectly match prior MRI.  Hopefully a new MRI will give [place] more information.   PDMP not reviewed this encounter. Orders Placed This Encounter  Procedures   [place] LIMITED JOINT SPACE STRUCTURES LOW BILAT(NO LINKED CHARGES)    Order Specific Question:   Reason for Exam (SYMPTOM  OR DIAGNOSIS REQUIRED)    Answer:   BL inj    Order Specific Question:   Preferred imaging location?    Answer:   [place]   MR Lumbar Spine Wo Contrast    Standing Status:   Future    Standing Expiration Date:   05/27/2023    Order Specific Question:   What is the patient's sedation requirement?    Answer:   No Sedation    Order Specific Question:   Does the patient have a pacemaker or implanted devices?    Answer:   No    Order Specific Question:   Preferred imaging location?    Answer:   [facility] (table limit-350lbs)   Meds ordered this encounter  Medications   Triamcinolone Acetonide (ZILRETTA) intra-articular injection 32 mg   Triamcinolone Acetonide (ZILRETTA) intra-articular injection 32 mg     Discussed warning signs or symptoms. Please see discharge instructions. Patient expresses understanding.   The above documentation has been reviewed and is accurate and complete Lynne Leader, M.D.

## 2022-05-26 ENCOUNTER — Ambulatory Visit: Payer: 59 | Admitting: Family Medicine

## 2022-05-26 ENCOUNTER — Ambulatory Visit: Payer: Self-pay

## 2022-05-26 VITALS — BP 118/78 | HR 70 | Ht 62.5 in | Wt 228.0 lb

## 2022-05-26 DIAGNOSIS — M170 Bilateral primary osteoarthritis of knee: Secondary | ICD-10-CM | POA: Diagnosis not present

## 2022-05-26 DIAGNOSIS — M25562 Pain in left knee: Secondary | ICD-10-CM

## 2022-05-26 DIAGNOSIS — M5416 Radiculopathy, lumbar region: Secondary | ICD-10-CM | POA: Diagnosis not present

## 2022-05-26 DIAGNOSIS — M25561 Pain in right knee: Secondary | ICD-10-CM

## 2022-05-26 DIAGNOSIS — G8929 Other chronic pain: Secondary | ICD-10-CM | POA: Diagnosis not present

## 2022-05-26 MED ORDER — TRIAMCINOLONE ACETONIDE 32 MG IX SRER
32.0000 mg | Freq: Once | INTRA_ARTICULAR | Status: AC
  Administered 2022-05-26: 32 mg via INTRA_ARTICULAR

## 2022-05-26 NOTE — Patient Instructions (Signed)
Thank you for coming in today.   Call or go to the ER if you develop a large red swollen joint with extreme pain or oozing puss.    You should hear from MRI scheduling within 1 week. If you do not hear please let me know.    We will plan for and ESI based on MRI results.

## 2022-06-05 ENCOUNTER — Ambulatory Visit (INDEPENDENT_AMBULATORY_CARE_PROVIDER_SITE_OTHER): Payer: 59

## 2022-06-05 DIAGNOSIS — M48061 Spinal stenosis, lumbar region without neurogenic claudication: Secondary | ICD-10-CM | POA: Diagnosis not present

## 2022-06-05 DIAGNOSIS — M5126 Other intervertebral disc displacement, lumbar region: Secondary | ICD-10-CM | POA: Diagnosis not present

## 2022-06-05 DIAGNOSIS — M5416 Radiculopathy, lumbar region: Secondary | ICD-10-CM

## 2022-06-09 ENCOUNTER — Other Ambulatory Visit (HOSPITAL_COMMUNITY): Payer: Self-pay

## 2022-06-09 ENCOUNTER — Encounter: Payer: Self-pay | Admitting: Family Medicine

## 2022-06-09 DIAGNOSIS — M5416 Radiculopathy, lumbar region: Secondary | ICD-10-CM

## 2022-06-09 DIAGNOSIS — M47816 Spondylosis without myelopathy or radiculopathy, lumbar region: Secondary | ICD-10-CM

## 2022-06-09 DIAGNOSIS — G8929 Other chronic pain: Secondary | ICD-10-CM

## 2022-06-09 DIAGNOSIS — M0579 Rheumatoid arthritis with rheumatoid factor of multiple sites without organ or systems involvement: Secondary | ICD-10-CM | POA: Diagnosis not present

## 2022-06-09 NOTE — Progress Notes (Signed)
Lumbar spine x-ray shows some worsening arthritis changes.  Some things actually look better.  At the facet joints at L4-5 you have less bone edema and joint swelling.  You do have pinched nerves at the L3-4 level which could explain some of your worsening radicular pain.  Would you like me to order epidural steroid injection?

## 2022-06-22 ENCOUNTER — Other Ambulatory Visit: Payer: Self-pay | Admitting: Physician Assistant

## 2022-06-22 ENCOUNTER — Other Ambulatory Visit (HOSPITAL_BASED_OUTPATIENT_CLINIC_OR_DEPARTMENT_OTHER): Payer: Self-pay

## 2022-06-22 ENCOUNTER — Ambulatory Visit
Admission: RE | Admit: 2022-06-22 | Discharge: 2022-06-22 | Disposition: A | Discharge status: Home / Self Care | Payer: 59 | Source: Ambulatory Visit | Attending: Family Medicine | Admitting: Family Medicine

## 2022-06-22 DIAGNOSIS — M5416 Radiculopathy, lumbar region: Secondary | ICD-10-CM

## 2022-06-22 DIAGNOSIS — M47817 Spondylosis without myelopathy or radiculopathy, lumbosacral region: Secondary | ICD-10-CM | POA: Diagnosis not present

## 2022-06-22 MED ORDER — IOPAMIDOL (ISOVUE-M 200) INJECTION 41%
1.0000 mL | Freq: Once | INTRAMUSCULAR | Status: AC
  Administered 2022-06-22: 1 mL via EPIDURAL

## 2022-06-22 MED ORDER — METHYLPREDNISOLONE ACETATE 40 MG/ML INJ SUSP (RADIOLOG
80.0000 mg | Freq: Once | INTRAMUSCULAR | Status: AC
  Administered 2022-06-22: 80 mg via EPIDURAL

## 2022-06-22 MED ORDER — IBUPROFEN 800 MG PO TABS
800.0000 mg | ORAL_TABLET | Freq: Three times a day (TID) | ORAL | 0 refills | Status: DC | PRN
  Filled 2022-06-22: qty 30, 10d supply, fill #0

## 2022-06-22 NOTE — Discharge Instructions (Signed)

## 2022-06-30 DIAGNOSIS — M79673 Pain in unspecified foot: Secondary | ICD-10-CM | POA: Diagnosis not present

## 2022-06-30 DIAGNOSIS — M0579 Rheumatoid arthritis with rheumatoid factor of multiple sites without organ or systems involvement: Secondary | ICD-10-CM | POA: Diagnosis not present

## 2022-06-30 DIAGNOSIS — M797 Fibromyalgia: Secondary | ICD-10-CM | POA: Diagnosis not present

## 2022-06-30 DIAGNOSIS — M549 Dorsalgia, unspecified: Secondary | ICD-10-CM | POA: Diagnosis not present

## 2022-06-30 DIAGNOSIS — M1990 Unspecified osteoarthritis, unspecified site: Secondary | ICD-10-CM | POA: Diagnosis not present

## 2022-06-30 DIAGNOSIS — M8580 Other specified disorders of bone density and structure, unspecified site: Secondary | ICD-10-CM | POA: Diagnosis not present

## 2022-06-30 DIAGNOSIS — Z79899 Other long term (current) drug therapy: Secondary | ICD-10-CM | POA: Diagnosis not present

## 2022-07-12 ENCOUNTER — Other Ambulatory Visit (HOSPITAL_COMMUNITY): Payer: Self-pay

## 2022-07-14 DIAGNOSIS — H524 Presbyopia: Secondary | ICD-10-CM | POA: Diagnosis not present

## 2022-07-14 DIAGNOSIS — M0579 Rheumatoid arthritis with rheumatoid factor of multiple sites without organ or systems involvement: Secondary | ICD-10-CM | POA: Diagnosis not present

## 2022-08-18 DIAGNOSIS — M0579 Rheumatoid arthritis with rheumatoid factor of multiple sites without organ or systems involvement: Secondary | ICD-10-CM | POA: Diagnosis not present

## 2022-08-24 ENCOUNTER — Other Ambulatory Visit: Payer: Self-pay | Admitting: Family Medicine

## 2022-08-25 ENCOUNTER — Other Ambulatory Visit (HOSPITAL_COMMUNITY): Payer: Self-pay

## 2022-08-25 ENCOUNTER — Telehealth: Payer: Self-pay | Admitting: Family Medicine

## 2022-08-25 ENCOUNTER — Other Ambulatory Visit: Payer: Self-pay

## 2022-08-25 MED ORDER — TRAMADOL HCL 50 MG PO TABS
50.0000 mg | ORAL_TABLET | ORAL | 1 refills | Status: DC | PRN
  Filled 2022-08-25: qty 180, 23d supply, fill #0

## 2022-08-25 NOTE — Telephone Encounter (Signed)
Patient called to schedule an appointment with Dr Georgina Snell for facet injection. She said that the last time they were done at [place], they hurt her very bad. She mentioned that Dr Georgina Snell has done this before and has been discussed.  Patient scheduled for Monday.  Can you confirm that this is okay?

## 2022-08-25 NOTE — Telephone Encounter (Signed)
I called and spoke with Butch Penny.  Plan for SI injection BL on Monday.

## 2022-08-26 NOTE — Progress Notes (Unsigned)
   I, Peterson Lombard, LAT, ATC acting as a scribe for Nancy Leader, MD.  Nancy [name] is a 57 y.o. female who presents to [place] at [facility] today for f/u LBP. Pt was last seen by Dr. Georgina Snell on 05/26/22 and was given bilat Zilretta injections and a new L-spine MRI was ordered. Based on MRI findings, facet injections and ESI were ordered, but neither were every performed. Pt's last lumbar ESI was performed on 06/22/22 and last facet injections were done on 12/03/21. Today, pt reports  Dx imaging: 06/05/22 L-spine MRI 01/10/21 L-spine MRI             11/12/20 R hip XR             04/02/20 L-spine MRI  Pertinent review of systems: ***  Relevant historical information: ***   Exam:  There were no vitals taken for this visit. General: Well Developed, well nourished, and in no acute distress.   MSK: ***    Lab and Radiology Results No results found for this or any previous visit (from the past 72 hour(s)). No results found.     Assessment and Plan: 57 y.o. female with ***   PDMP not reviewed this encounter. No orders of the defined types were placed in this encounter.  No orders of the defined types were placed in this encounter.    Discussed warning signs or symptoms. Please see discharge instructions. Patient expresses understanding.   ***

## 2022-08-29 ENCOUNTER — Ambulatory Visit: Payer: Self-pay

## 2022-08-29 ENCOUNTER — Ambulatory Visit: Payer: Commercial Managed Care - PPO | Admitting: Family Medicine

## 2022-08-29 VITALS — BP 138/84 | HR 65 | Ht 62.5 in | Wt 228.0 lb

## 2022-08-29 DIAGNOSIS — G8929 Other chronic pain: Secondary | ICD-10-CM | POA: Diagnosis not present

## 2022-08-29 DIAGNOSIS — M25561 Pain in right knee: Secondary | ICD-10-CM | POA: Diagnosis not present

## 2022-08-29 DIAGNOSIS — M533 Sacrococcygeal disorders, not elsewhere classified: Secondary | ICD-10-CM

## 2022-08-29 DIAGNOSIS — M170 Bilateral primary osteoarthritis of knee: Secondary | ICD-10-CM | POA: Diagnosis not present

## 2022-08-29 DIAGNOSIS — M5416 Radiculopathy, lumbar region: Secondary | ICD-10-CM | POA: Diagnosis not present

## 2022-08-29 DIAGNOSIS — M25562 Pain in left knee: Secondary | ICD-10-CM

## 2022-08-29 NOTE — Patient Instructions (Addendum)
Thank you for coming in today.   You received an injection today. Seek immediate medical attention if the joint becomes red, extremely painful, or is oozing fluid.   Please call [facility] at [phone] to schedule your spine injection.    You should hear soon about Zilretta

## 2022-08-30 ENCOUNTER — Ambulatory Visit: Payer: Commercial Managed Care - PPO | Admitting: Family Medicine

## 2022-09-06 ENCOUNTER — Ambulatory Visit (INDEPENDENT_AMBULATORY_CARE_PROVIDER_SITE_OTHER): Payer: Commercial Managed Care - PPO | Admitting: Family Medicine

## 2022-09-06 ENCOUNTER — Encounter: Payer: Self-pay | Admitting: Family Medicine

## 2022-09-06 VITALS — BP 110/64 | HR 62 | Temp 97.5°F | Ht 62.5 in | Wt 226.0 lb

## 2022-09-06 DIAGNOSIS — M5416 Radiculopathy, lumbar region: Secondary | ICD-10-CM | POA: Diagnosis not present

## 2022-09-06 DIAGNOSIS — D849 Immunodeficiency, unspecified: Secondary | ICD-10-CM | POA: Diagnosis not present

## 2022-09-06 DIAGNOSIS — M0579 Rheumatoid arthritis with rheumatoid factor of multiple sites without organ or systems involvement: Secondary | ICD-10-CM

## 2022-09-06 DIAGNOSIS — M170 Bilateral primary osteoarthritis of knee: Secondary | ICD-10-CM

## 2022-09-06 NOTE — Patient Instructions (Addendum)
Refer to Dr. Brien Few. If it takes more than 2 weeks or so to get in we can also do PT visits here   Recommended follow up: Return in about 6 months (around 03/07/2023) for physical or sooner if needed.Schedule b4 you leave.

## 2022-09-06 NOTE — Progress Notes (Signed)
Phone [phone] In person visit   Subjective:   Nancy [name] is a 57 y.o. year old very pleasant female patient who presents for/with See problem oriented charting Chief Complaint  Patient presents with   Back Pain    Pt c/o back and left leg pain  for a while.    Past Medical History-  Patient Active Problem List   Diagnosis Date Noted   Immunosuppressed status ([place]) 12/26/2019    Priority: High   Rheumatoid arthritis involving multiple sites with positive rheumatoid factor ([place]) 02/21/2019    Priority: High   Fibromyalgia 04/12/2018    Priority: High   Hyperlipidemia 12/25/2019    Priority: Medium    Vaginal dryness 04/12/2018    Priority: Medium    Morbid obesity ([place]) 04/12/2018    Priority: Medium    History of colon polyps 04/12/2018    Priority: Medium    Primary osteoarthritis of both knees 12/28/2017    Priority: Medium    Mild intermittent asthma 11/20/2013    Priority: Medium    Bradycardia 07/04/2012    Priority: Medium    Vitamin B12 deficiency 02/14/2019    Priority: Low   Vitamin D deficiency 04/12/2018    Priority: Low   History of total hysterectomy 04/12/2018    Priority: Low   Insomnia 04/12/2018    Priority: Low   Cervicogenic headache 03/26/2014    Priority: Low   Cervical radiculopathy 03/26/2014    Priority: Low   Asthma 03/07/2022   Urge incontinence 12/20/2018   Urinary frequency 12/20/2018    Medications- reviewed and updated Current Outpatient Medications  Medication Sig Dispense Refill   Abatacept (ORENCIA IV) Inject into the vein.     albuterol (VENTOLIN HFA) 108 (90 Base) MCG/ACT inhaler Inhale 2 puffs into the lungs every 6 (six) hours as needed for wheezing or shortness of breath. 6.7 g 3   Calcium Carbonate (CALTRATE 600 PO) Take by mouth.     Cholecalciferol (VITAMIN D3) 125 MCG (5000 UT) CAPS Take by mouth daily.     cyanocobalamin 1000 MCG tablet Take 1,000 mcg by mouth daily.     estradiol (ESTRACE) 0.1 MG/GM  vaginal cream Insert 1/2 gram into vagina 1 to 3 times per week. Short course before starting tablets back 42.5 g 0   Estradiol 10 MCG TABS vaginal tablet Place 1 tablet (10 mcg total) vaginally 2 (two) times a week. 24 tablet 3   folic acid (FOLVITE) 1 MG tablet TAKE 1 TABLET BY MOUTH DAILY 90 tablet 3   gabapentin (NEURONTIN) 300 MG capsule Take 1 capsule by mouth each morning and 1 capsule each afternoon and 2 capsules before bed as directed 360 capsule 3   hydrochlorothiazide (HYDRODIURIL) 25 MG tablet TAKE 1 TABLET BY MOUTH DAILY AS NEEDED (FLUID RETENTION/EDEMA). 90 tablet 1   ibuprofen (ADVIL) 800 MG tablet Take 1 tablet (800 mg total) by mouth every 8 (eight) hours as needed. 30 tablet 0   leflunomide (ARAVA) 20 MG tablet Take 1 tablet (20 mg total) by mouth daily. 90 tablet 3   levocetirizine (XYZAL) 5 MG tablet Take 1 tablet (5 mg total) by mouth every evening. 90 tablet 1   melatonin 5 MG TABS 1 tablet at bedtime as needed with food     niacin 50 MG tablet Take 50 mg by mouth at bedtime.     ondansetron (ZOFRAN-ODT) 4 MG disintegrating tablet Take 1 tablet (4 mg total) by mouth every 8 (eight) hours as needed  for nausea or vomiting. 20 tablet 0   traMADol (ULTRAM) 50 MG tablet Take 1-2 tablets (50-100 mg total) by mouth every 4-6 hours as needed for moderate pain (max of 8 per day) 180 tablet 1   Turmeric 500 MG CAPS Take by mouth 2 (two) times daily.     valACYclovir (VALTREX) 1000 MG tablet Take 1 tablet (1,000 mg total) by mouth 2 (two) times daily. 30 tablet 0   vitamin C (ASCORBIC ACID) 500 MG tablet Take 500 mg by mouth as needed.     Zinc Sulfate (ZINC 15 PO) Take 30 mg by mouth.     No current facility-administered medications for this visit.     Objective:  BP 110/64   Pulse 62   Temp (!) 97.5 F (36.4 C)   Ht 5' 2.5" (1.588 m)   Wt 226 lb (102.5 kg)   SpO2 97%   BMI 40.68 kg/m  Gen: NAD, resting comfortably     Assessment and Plan   # Back pain/left leg  pain S: Patient last seen by Dr. Georgina Snell on 08/29/2022.  Prior to that visit facet injections and ESI injections were ordered with ESI performed in lumbar spine 06/22/2022 and facet joint injection 12/03/2021 most recently.  She did have MRI of lumbar spine updated 06/05/2022 with the following impression "IMPRESSION: 1. Mild progression of degenerative changes at L3-4 with mild-to-moderate left subarticular zone stenosis. 2. Mild spinal canal stenosis with mild narrowing of the right subarticular zone and mild right neural foraminal narrowing at L4-5. 3. Mild right neural foraminal narrowing at L5-S1."  At time of that visit she was having low back pain, pain over SI joint and radiation into the left lower leg concerning for lumbar radiculopathy/radiculitis -She did receive an SI joint injection and noted significant relief in symptoms but still has chronic lingering symptoms in her back and down to her left foot with some numbness and tingling but she would not be interested in surgery -Another epidural steroid injection was ordered but she has not scheduled yet (she is not as confident in moving forward with [facility] imaging as only 6 weeks and last set was painful- as well as prior facet joint injections- had better experience through neurosurgery in the past through Dr. Maggie Schwalbe who she believed to have retired and wanted to inquire about seeing a different provider within neurosurgery) -Zilretta injection was sent for approval- injection for SI joint helped knee some as well  A/P: Recently diagnosed with lumbar radiculopathy/radiculitis and had planned epidural steroid injection with [facility] imaging.  I informed her that Dr. Brien Few is practicing with [facility] as physical medicine and rehabilitation specialist-she is interested in resuming her care with him and referral was placed today  # Rheumatoid arthritis # Osteoarthritis # Fibromyalgia S: She also has rheumatoid arthritis and follows with Dr.  Kathlene November of rheumatology with reported stability at November visit on Orencia and leflunomide.  We have also prescribed tramadol for osteoarthritis and she is on gabapentin for fibromyalgia with 1 capsule in the morning, 1 capsule in the afternoon and 2 capsules before bed A/P: Rheumatoid arthritis has been stable/improved on current regimen-continue current medication and rheumatology follow-up.  Immunosuppressed status noted on these -Osteoarthritis with ongoing pain particularly in the knees and has some degenerative changes in her back-she is considering Zilretta injections which are in the process of approval.  I did order a refill on her tramadol recently-with 180 tablets typically lasting about 3 months with feels in August, November  and recently in January -Fibromyalgia reportedly stable on gabapentin as above-continue current medication   # Edema management-patient using hydrochlorothiazide once or twice a week and effective for edema management-blood pressure also is tolerating this  Recommended follow up: Return in about 6 months (around 03/07/2023) for physical or sooner if needed.Schedule b4 you leave. Future Appointments  Date Time Provider [place]  03/09/2023  3:00 PM Marin Olp, MD [facility]    Lab/Order associations:   ICD-10-CM   1. Left lumbar radiculitis  M54.16 Ambulatory referral to Physical Medicine Rehab    2. Immunosuppressed status ([place]) Chronic D84.9     3. Rheumatoid arthritis involving multiple sites with positive rheumatoid factor ([facility]) Chronic M05.79     4. Primary osteoarthritis of both knees  M17.0       No orders of the defined types were placed in this encounter.   Return precautions advised.  Garret Reddish, MD

## 2022-09-07 ENCOUNTER — Other Ambulatory Visit: Payer: Self-pay

## 2022-09-07 ENCOUNTER — Other Ambulatory Visit: Payer: Self-pay | Admitting: Physician Assistant

## 2022-09-07 ENCOUNTER — Other Ambulatory Visit (HOSPITAL_COMMUNITY): Payer: Self-pay

## 2022-09-07 MED ORDER — IBUPROFEN 800 MG PO TABS
800.0000 mg | ORAL_TABLET | Freq: Three times a day (TID) | ORAL | 0 refills | Status: DC | PRN
  Filled 2022-09-07 – 2022-09-12 (×2): qty 30, 10d supply, fill #0

## 2022-09-12 ENCOUNTER — Other Ambulatory Visit (HOSPITAL_COMMUNITY): Payer: Self-pay

## 2022-09-12 ENCOUNTER — Other Ambulatory Visit (HOSPITAL_BASED_OUTPATIENT_CLINIC_OR_DEPARTMENT_OTHER): Payer: Self-pay

## 2022-09-12 ENCOUNTER — Encounter (HOSPITAL_BASED_OUTPATIENT_CLINIC_OR_DEPARTMENT_OTHER): Payer: Self-pay

## 2022-09-13 ENCOUNTER — Other Ambulatory Visit (HOSPITAL_BASED_OUTPATIENT_CLINIC_OR_DEPARTMENT_OTHER): Payer: Self-pay

## 2022-09-13 ENCOUNTER — Ambulatory Visit: Payer: Commercial Managed Care - PPO | Admitting: Family Medicine

## 2022-09-15 ENCOUNTER — Other Ambulatory Visit: Payer: Self-pay | Admitting: Family Medicine

## 2022-09-15 DIAGNOSIS — M0579 Rheumatoid arthritis with rheumatoid factor of multiple sites without organ or systems involvement: Secondary | ICD-10-CM | POA: Diagnosis not present

## 2022-09-16 ENCOUNTER — Other Ambulatory Visit (HOSPITAL_COMMUNITY): Payer: Self-pay

## 2022-09-16 MED ORDER — ESTRADIOL 10 MCG VA TABS
1.0000 | ORAL_TABLET | VAGINAL | 3 refills | Status: DC
  Filled 2022-09-16 (×2): qty 24, 84d supply, fill #0
  Filled 2023-05-02: qty 24, 84d supply, fill #1

## 2022-09-21 ENCOUNTER — Telehealth: Payer: Self-pay | Admitting: Family Medicine

## 2022-09-21 NOTE — Telephone Encounter (Signed)
Fax received today from [facility] that coverage for Orvan Seen has been denied.   "We consider extended-release triamcinolone acetonide injection suspension (Zilretta) not medically necessary because it has not demonstrated a significant improvement in osteoarthritis pain compared with the immediate-release formulation of Triamcinolone Acetonide".

## 2022-09-21 NOTE — Telephone Encounter (Signed)
Pt had BILAT knee inj 05/27/23.   Fax received today from [place] that coverage for Orvan Seen has been denied.    "We consider extended-release triamcinolone acetonide injection suspension (Zilretta) not medically necessary because it has not demonstrated a significant improvement in osteoarthritis pain compared with the immediate-release formulation of Triamcinolone Acetonide".

## 2022-09-21 NOTE — Telephone Encounter (Signed)
What are Dr Clovis Riley thoughts on next steps?

## 2022-09-21 NOTE — Telephone Encounter (Signed)
Patient called to follow up on approval for possible Zilretta. Do you know if we have gotten anything back yet for her?

## 2022-09-28 DIAGNOSIS — M461 Sacroiliitis, not elsewhere classified: Secondary | ICD-10-CM | POA: Diagnosis not present

## 2022-09-28 DIAGNOSIS — M5416 Radiculopathy, lumbar region: Secondary | ICD-10-CM | POA: Diagnosis not present

## 2022-09-28 DIAGNOSIS — M47816 Spondylosis without myelopathy or radiculopathy, lumbar region: Secondary | ICD-10-CM | POA: Diagnosis not present

## 2022-09-28 DIAGNOSIS — M5126 Other intervertebral disc displacement, lumbar region: Secondary | ICD-10-CM | POA: Diagnosis not present

## 2022-09-30 NOTE — Telephone Encounter (Signed)
Patient called back to see what the next steps would be?  Please advise.

## 2022-10-06 ENCOUNTER — Ambulatory Visit: Payer: Self-pay

## 2022-10-06 ENCOUNTER — Ambulatory Visit: Payer: Commercial Managed Care - PPO | Admitting: Family Medicine

## 2022-10-06 VITALS — BP 142/88 | HR 72 | Ht 62.5 in

## 2022-10-06 DIAGNOSIS — M170 Bilateral primary osteoarthritis of knee: Secondary | ICD-10-CM

## 2022-10-06 DIAGNOSIS — M25561 Pain in right knee: Secondary | ICD-10-CM | POA: Diagnosis not present

## 2022-10-06 DIAGNOSIS — M25562 Pain in left knee: Secondary | ICD-10-CM | POA: Diagnosis not present

## 2022-10-06 DIAGNOSIS — G8929 Other chronic pain: Secondary | ICD-10-CM

## 2022-10-06 NOTE — Patient Instructions (Addendum)
Thank you for coming in today.   You received an injection today. Seek immediate medical attention if the joint becomes red, extremely painful, or is oozing fluid.   Let me know what you want to do next.   Anticipate next injection at about 3 months.

## 2022-10-06 NOTE — Telephone Encounter (Signed)
Provided pt information via VM.

## 2022-10-06 NOTE — Telephone Encounter (Signed)
Patient scheduled to see Dr Georgina Snell this afternoon.

## 2022-10-06 NOTE — Telephone Encounter (Signed)
I do not know what to do here.  We can try the gel shots but this has not worked very well for you in the past.  We can do a regular cortisone shot and know that they will work for a little while. I think your best bet is to plan for regular cortisone shots we do think you are going to need them the most.

## 2022-10-06 NOTE — Progress Notes (Signed)
Shirlyn Goltz, PhD, LAT, ATC acting as a scribe for Lynne Leader, MD.  Nancy [name] is a 57 y.o. female who presents to [facility] today for continued bilateral knee pain.  Pt was last seen by Dr. Georgina Snell on 08/29/2022 but the visit was focused mostly on her LBP.  Patient had bilateral Zilretta injections on 05/26/2022 and bilateral Durolane injections on 01/24/2022.  Today, patient reports both knee are really bothersome, L>R. Pt is wanting to try a traditional steroid injection, since her insurance denied Zilretta.  She has had trials of Durolane with very little benefit.  Dx imaging: 11/12/2020 R knee XR  Pertinent review of systems: No fevers or chills.  Relevant historical information: Rheumatoid arthritis.   Exam:  BP (!) 142/88   Pulse 72   Ht 5' 2.5" (1.588 m)   SpO2 97%   BMI 40.68 kg/m  General: Well Developed, well nourished, and in no acute distress.   MSK: Right knee: Moderate effusion normal motion with crepitation. Left knee moderate effusion normal motion with crepitation. Normal gait.    Lab and Radiology Results  Procedure: Real-time Ultrasound Guided Injection of right knee superior lateral patellar space Device: Philips Affiniti 50G Images permanently stored and available for review in PACS Verbal informed consent obtained.  Discussed risks and benefits of procedure. Warned about infection, bleeding, hyperglycemia damage to structures among others. Patient expresses understanding and agreement Time-out conducted.   Noted no overlying erythema, induration, or other signs of local infection.   Skin prepped in a sterile fashion.   Local anesthesia: Topical Ethyl chloride.   With sterile technique and under real time ultrasound guidance: 40 mg of Kenalog and 2 mL Marcaine injected into knee joint. Fluid seen entering the joint capsule.   Completed without difficulty   Pain immediately resolved suggesting accurate  placement of the medication.   Advised to call if fevers/chills, erythema, induration, drainage, or persistent bleeding.   Images permanently stored and available for review in the ultrasound unit.  Impression: Technically successful ultrasound guided injection.    Procedure: Real-time Ultrasound Guided Injection of left knee superior lateral patellar space Device: Philips Affiniti 50G Images permanently stored and available for review in PACS Verbal informed consent obtained.  Discussed risks and benefits of procedure. Warned about infection, bleeding, hyperglycemia damage to structures among others. Patient expresses understanding and agreement Time-out conducted.   Noted no overlying erythema, induration, or other signs of local infection.   Skin prepped in a sterile fashion.   Local anesthesia: Topical Ethyl chloride.   With sterile technique and under real time ultrasound guidance: 40 mg of Kenalog and 2 ml Marcaine injected into knee joint. Fluid seen entering the joint capsule.   Completed without difficulty   Pain immediately resolved suggesting accurate placement of the medication.   Advised to call if fevers/chills, erythema, induration, drainage, or persistent bleeding.   Images permanently stored and available for review in the ultrasound unit.  Impression: Technically successful ultrasound guided injection.        Assessment and Plan: 57 y.o. female with bilateral knee pain due to DJD.  Rheumatoid arthritis may be a component as well.  Plan for conventional steroid injection bilateral knees today.  Hopefully she will get close to 3 months of relief from these injections.  We talked about knee replacement.  That may be common for her sooner than rather than later.  Her BMI is very close to 40 now.  She could  probably get to the point where she could technically have a knee replacement.  However her back is not in great shape and going through knee replacement recovery would  likely set her back off.  She is not excited about that prospect but that may be her best option in the future.   PDMP not reviewed this encounter. Orders Placed This Encounter  Procedures   [place] LIMITED JOINT SPACE STRUCTURES LOW BILAT(NO LINKED CHARGES)    Order Specific Question:   Reason for Exam (SYMPTOM  OR DIAGNOSIS REQUIRED)    Answer:   bilateral knee pain    Order Specific Question:   Preferred imaging location?    Answer:   [facility]   No orders of the defined types were placed in this encounter.    Discussed warning signs or symptoms. Please see discharge instructions. Patient expresses understanding.   The above documentation has been reviewed and is accurate and complete Lynne Leader, M.D.

## 2022-10-13 DIAGNOSIS — M0579 Rheumatoid arthritis with rheumatoid factor of multiple sites without organ or systems involvement: Secondary | ICD-10-CM | POA: Diagnosis not present

## 2022-10-18 ENCOUNTER — Other Ambulatory Visit: Payer: Self-pay

## 2022-10-18 DIAGNOSIS — M5416 Radiculopathy, lumbar region: Secondary | ICD-10-CM | POA: Diagnosis not present

## 2022-10-20 ENCOUNTER — Telehealth: Payer: Self-pay | Admitting: Orthopaedic Surgery

## 2022-10-20 NOTE — Telephone Encounter (Signed)
Patient asking if she can get a gel injection, she has gotten cortisone injections from her sports medicine Dr. But he doesn't do Gel. Sending call to April. She is a former patient of Dr. Ninfa Linden and want Dr. Ninfa Linden to give the injection if she gets approved.

## 2022-10-21 NOTE — Telephone Encounter (Signed)
Talked with patient concerning gel injection.  Provided patient with J-code and CPT code to clarify if her insurance will cover for gel injection.  Patient will CB to schedule.

## 2022-11-03 DIAGNOSIS — Z79899 Other long term (current) drug therapy: Secondary | ICD-10-CM | POA: Diagnosis not present

## 2022-11-03 DIAGNOSIS — M1990 Unspecified osteoarthritis, unspecified site: Secondary | ICD-10-CM | POA: Diagnosis not present

## 2022-11-03 DIAGNOSIS — M0579 Rheumatoid arthritis with rheumatoid factor of multiple sites without organ or systems involvement: Secondary | ICD-10-CM | POA: Diagnosis not present

## 2022-11-03 DIAGNOSIS — M797 Fibromyalgia: Secondary | ICD-10-CM | POA: Diagnosis not present

## 2022-11-10 DIAGNOSIS — M0579 Rheumatoid arthritis with rheumatoid factor of multiple sites without organ or systems involvement: Secondary | ICD-10-CM | POA: Diagnosis not present

## 2022-11-17 ENCOUNTER — Other Ambulatory Visit: Payer: Self-pay | Admitting: *Deleted

## 2022-11-17 DIAGNOSIS — M5416 Radiculopathy, lumbar region: Secondary | ICD-10-CM

## 2022-11-21 ENCOUNTER — Telehealth: Payer: Self-pay | Admitting: Family Medicine

## 2022-11-21 NOTE — Telephone Encounter (Signed)
I have sent [name] a message advising to send ref to another location

## 2022-11-21 NOTE — Telephone Encounter (Signed)
[facility] called and states they do no take pts insurance. Please advise.

## 2022-11-24 ENCOUNTER — Other Ambulatory Visit (HOSPITAL_BASED_OUTPATIENT_CLINIC_OR_DEPARTMENT_OTHER): Payer: Self-pay

## 2022-11-24 ENCOUNTER — Other Ambulatory Visit (HOSPITAL_COMMUNITY): Payer: Self-pay

## 2022-11-24 ENCOUNTER — Encounter (HOSPITAL_COMMUNITY): Payer: Self-pay

## 2022-12-01 ENCOUNTER — Ambulatory Visit: Payer: Commercial Managed Care - PPO | Admitting: Orthopaedic Surgery

## 2022-12-01 ENCOUNTER — Other Ambulatory Visit (INDEPENDENT_AMBULATORY_CARE_PROVIDER_SITE_OTHER): Payer: Commercial Managed Care - PPO

## 2022-12-01 ENCOUNTER — Telehealth: Payer: Self-pay

## 2022-12-01 ENCOUNTER — Encounter: Payer: Self-pay | Admitting: Orthopaedic Surgery

## 2022-12-01 VITALS — Ht 62.5 in | Wt 237.0 lb

## 2022-12-01 DIAGNOSIS — M25561 Pain in right knee: Secondary | ICD-10-CM

## 2022-12-01 DIAGNOSIS — G8929 Other chronic pain: Secondary | ICD-10-CM

## 2022-12-01 DIAGNOSIS — M25562 Pain in left knee: Secondary | ICD-10-CM | POA: Diagnosis not present

## 2022-12-01 NOTE — Telephone Encounter (Signed)
Bilateral knee injections  

## 2022-12-01 NOTE — Progress Notes (Signed)
  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  At this point she has tried and failed all forms conservative treatment.  This includes steroid injections.  She is interested in trying hyaluronic acid for her knees.  She is someone who does have rheumatoid disease and so she does have a combination of osteoarthritis with her knees but also rheumatoid arthritis.  She is on medications for rheumatoid arthritis.  She is not a diabetic at all.  I was able to review all of her medications and medicines within epic.  There has been no recent illnesses.  She is a very pleasant individual.  On exam both knees have slight valgus malalignment.  There is no effusion with either knee but she does have global tenderness and patellofemoral crepitation as well.  Her BMI is 42.66.  X-rays of both knees were obtained today.  Both knees have joint space narrowing with osteophytes in all 3 compartments.  She has significant disease of the patellofemoral joint of both knees.  We talked about conservative treatment modalities and she is very interested in hyaluronic acid and I think this is the next step certainly to try.  She can still work on quad strengthening exercises and weight loss will certainly help however she may end up with a recommendation for knee replacement surgery if all conservative treatment options fail.  We will work on hopefully getting her hyaluronic acid ordered and approved for her knees.  Will then see her back for that visit to place these in her knees.  All questions and concerns were answered and addressed.  This patient is diagnosed with osteoarthritis of the knee(s).    Radiographs show evidence of joint space narrowing, osteophytes, subchondral sclerosis and/or subchondral cysts.  This patient has knee pain which interferes with functional and activities of daily living.    This patient has experienced  inadequate response, adverse effects and/or intolerance with conservative treatments such as acetaminophen, NSAIDS, topical creams, physical therapy or regular exercise, knee bracing and/or weight loss.   This patient has experienced inadequate response or has a contraindication to intra articular steroid injections for at least 3 months.   This patient is not scheduled to have a total knee replacement within 6 months of starting treatment with viscosupplementation.

## 2022-12-05 ENCOUNTER — Other Ambulatory Visit (HOSPITAL_BASED_OUTPATIENT_CLINIC_OR_DEPARTMENT_OTHER): Payer: Self-pay

## 2022-12-05 NOTE — Telephone Encounter (Signed)
VOB submitted for Monovisc, bilateral knee  

## 2022-12-06 ENCOUNTER — Other Ambulatory Visit (HOSPITAL_BASED_OUTPATIENT_CLINIC_OR_DEPARTMENT_OTHER): Payer: Self-pay

## 2022-12-07 ENCOUNTER — Telehealth: Payer: Self-pay

## 2022-12-07 NOTE — Telephone Encounter (Signed)
Faxed completed PA form to [employer] at [phone] for Monovisc, bilateral knee. PA pending 

## 2022-12-13 ENCOUNTER — Other Ambulatory Visit: Payer: Self-pay | Admitting: *Deleted

## 2022-12-13 ENCOUNTER — Other Ambulatory Visit (HOSPITAL_BASED_OUTPATIENT_CLINIC_OR_DEPARTMENT_OTHER): Payer: Self-pay

## 2022-12-14 ENCOUNTER — Other Ambulatory Visit (HOSPITAL_BASED_OUTPATIENT_CLINIC_OR_DEPARTMENT_OTHER): Payer: Self-pay

## 2022-12-15 DIAGNOSIS — M0579 Rheumatoid arthritis with rheumatoid factor of multiple sites without organ or systems involvement: Secondary | ICD-10-CM | POA: Diagnosis not present

## 2022-12-19 ENCOUNTER — Other Ambulatory Visit: Payer: Self-pay

## 2022-12-19 DIAGNOSIS — G8929 Other chronic pain: Secondary | ICD-10-CM

## 2022-12-28 ENCOUNTER — Telehealth: Payer: Self-pay | Admitting: Orthopaedic Surgery

## 2022-12-28 NOTE — Telephone Encounter (Signed)
Called pt 1X and left vm for pt to call and set an appt with Dr Magnus Ivan only for Monovisc gel injection. PA Chestine Spore will not be in office

## 2022-12-30 ENCOUNTER — Encounter: Payer: Self-pay | Admitting: Family Medicine

## 2023-01-05 ENCOUNTER — Ambulatory Visit: Payer: Commercial Managed Care - PPO | Admitting: Physician Assistant

## 2023-01-05 ENCOUNTER — Encounter: Payer: Self-pay | Admitting: Physician Assistant

## 2023-01-05 DIAGNOSIS — G8929 Other chronic pain: Secondary | ICD-10-CM

## 2023-01-05 DIAGNOSIS — M25561 Pain in right knee: Secondary | ICD-10-CM

## 2023-01-05 DIAGNOSIS — M25562 Pain in left knee: Secondary | ICD-10-CM | POA: Diagnosis not present

## 2023-01-05 MED ORDER — HYALURONAN 88 MG/4ML IX SOSY
88.0000 mg | PREFILLED_SYRINGE | INTRA_ARTICULAR | Status: AC | PRN
  Administered 2023-01-05: 88 mg via INTRA_ARTICULAR

## 2023-01-05 NOTE — Progress Notes (Signed)
Office Visit Note   Patient: Nancy [name]           Date of Birth: 12-01-1965           MRN: [id number] Visit Date: 01/05/2023              Requested by: Shelva Majestic, MD [address] [place],  [place] [address] PCP: Shelva Majestic, MD  Chief Complaint  Patient presents with  . Right Knee - Pain  . 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  She is a patient of Dr. Eliberto Ivory.  She comes in today for bilateral Monovisc injections  Assessment & Plan: Visit Diagnoses: Arthritis bilateral knees  Plan: She was injected in her knees without any difficulty he tolerated the procedure well may follow-up with Dr. Magnus Ivan or Delane Ginger as needed  Follow-Up Instructions: Return if symptoms worsen or fail to improve.   Ortho Exam  Patient is alert, oriented, no adenopathy, well-dressed, normal affect, normal respiratory effort. Bilateral knees no effusion no erythema compartments are soft and compressible she is neurovascular intact  Imaging: No results found. No images are attached to the encounter.  Labs: Lab Results  Component Value Date   ESRSEDRATE 113 (H) 04/06/2021   ESRSEDRATE 48 (H) 12/02/2020   ESRSEDRATE 51 (H) 09/03/2020   CRP 26 05/03/2019   CRP 3.5 02/19/2019   LABURIC 5.5 02/09/2017   LABORGA NO GROWTH 09/21/2016     Lab Results  Component Value Date   ALBUMIN 3.9 03/07/2022   ALBUMIN 4.1 08/30/2021   ALBUMIN 4.0 04/06/2021    No results found for: "MG" Lab Results  Component Value Date   VD25OH 80.00 03/07/2022   VD25OH 59.80 12/31/2020   VD25OH 27.37 (L) 12/26/2019    No results found for: "PREALBUMIN"    Latest Ref Rng & Units 03/07/2022    8:10 AM 08/30/2021    8:51 AM 04/06/2021   10:32 AM  CBC EXTENDED  WBC 4.0 - 10.5 K/uL 7.3  6.5  7.0   RBC 3.87 - 5.11 Mil/uL 4.57  5.04  4.86   Hemoglobin 12.0 - 15.0 g/dL 27.2  53.6  64.4   HCT 36.0 - 46.0  % 38.5  41.8  41.3   Platelets 150.0 - 400.0 K/uL 200.0  217.0  218.0   NEUT# 1.4 - 7.7 K/uL 4.0  4.2  4.8   Lymph# 0.7 - 4.0 K/uL 2.3  1.6  1.4      There is no height or weight on file to calculate BMI.  Orders:  No orders of the defined types were placed in this encounter.  No orders of the defined types were placed in this encounter.    Procedures: Large Joint Inj: bilateral knee on 01/05/2023 3:32 PM Indications: pain and diagnostic evaluation Details: 1.5 in anteromedial approach  Arthrogram: No  Medications (Right): 88 mg Hyaluronan 88 MG/4ML Medications (Left): 88 mg Hyaluronan 88 MG/4ML Outcome: tolerated well, no immediate complications Procedure, treatment alternatives, risks and benefits explained, specific risks discussed. Consent was given by the patient.    Clinical Data: No additional findings.  ROS:  All other systems negative, except as noted in the HPI. Review of Systems  Objective: Vital Signs: There were no vitals taken for this visit.  Specialty Comments:  No specialty comments available.  PMFS History: Patient Active Problem List   Diagnosis  Date Noted  . Asthma 03/07/2022  . Immunosuppressed status (HCC) 12/26/2019  . Hyperlipidemia 12/25/2019  . Rheumatoid arthritis involving multiple sites with positive rheumatoid factor (HCC) 02/21/2019  . Vitamin B12 deficiency 02/14/2019  . Urge incontinence 12/20/2018  . Urinary frequency 12/20/2018  . Fibromyalgia 04/12/2018  . Vitamin D deficiency 04/12/2018  . Vaginal dryness 04/12/2018  . History of total hysterectomy 04/12/2018  . Insomnia 04/12/2018  . Morbid obesity (HCC) 04/12/2018  . History of colon polyps 04/12/2018  . Primary osteoarthritis of both knees 12/28/2017  . Cervicogenic headache 03/26/2014  . Cervical radiculopathy 03/26/2014  . Mild intermittent asthma 11/20/2013  . Bradycardia 07/04/2012   Past Medical History:  Diagnosis Date  . Anemia   . Asthma   . Bradycardia    . Chicken pox   . Dysrhythmia    occ. palpitations  . Fibromyalgia   . Headache(784.0)   . Migraines   . Neck pain    bulging disk  . Polyarthralgia 02/21/2019  . PONV (postoperative nausea and vomiting)    sometimes wakes up after surgery and cannot breath due to asthma, can be bradycardic  . Rheumatoid arthritis involving multiple sites with positive rheumatoid factor (HCC) 02/21/2019  . Shortness of breath    with bradycardia    Family History  Problem Relation Age of Onset  . Colon polyps Mother   . Hyperlipidemia Mother        36 in 2019  . Arthritis Mother   . Osteoporosis Mother   . Lumbar disc disease Mother        herniated discs.   Marland Kitchen COPD Mother   . Hypertension Father        doesnt know his history well- no recent contact  . Other Sister        doesnt know history well  . Lumbar disc disease Brother        grew up with him   . Other Brother        doesnt know history well  . Heart disease Maternal Grandmother   . Diabetes Maternal Grandmother   . Cervical cancer Maternal Grandmother   . Colon cancer Maternal Grandfather   . Heart disease Maternal Grandfather   . Cancer - Colon Maternal Grandfather   . Diabetes Paternal Grandmother   . Lung cancer Paternal Grandmother        dad's parents stepped in to help when dad not present  . Atrial fibrillation Paternal Grandfather        required a few shocks  . Bradycardia Paternal Grandfather   . Hypertension Paternal Grandfather   . Esophageal cancer Neg Hx   . Stomach cancer Neg Hx   . Rectal cancer Neg Hx     Past Surgical History:  Procedure Laterality Date  . ABDOMINAL HYSTERECTOMY    . BACK SURGERY     cervical fusion C5,6,7 with a bulge at C4  . BLADDER SURGERY  July 10th 2013  . CESAREAN SECTION     1992 and 1995  . CHOLECYSTECTOMY N/A 10/07/2021   Procedure: LAPAROSCOPIC CHOLECYSTECTOMY;  Surgeon: Abigail Miyamoto, MD;  Location: [facility];  Service: General;  Laterality: N/A;   . COLONOSCOPY  11/27/2015   Dr.Pyrtle  . CYSTOSCOPY N/A 07/31/2018   Procedure: CYSTOSCOPY;  Surgeon: Alfredo Martinez, MD;  Location: [facility];  Service: Urology;  Laterality: N/A;  . DIAGNOSTIC LAPAROSCOPY    . EXCISION OF MESH N/A 07/31/2018   Procedure: REMOVAL OF VAGINAL MESH;  Surgeon: Alfredo Martinez, MD;  Location: [facility];  Service: Urology;  Laterality: N/A;  . LAPAROSCOPY Bilateral 05/31/2013   Procedure: LAPAROSCOPY withbilateral salpingo-oophorectomy, lysis of adhesions;  Surgeon: Leslie Andrea, MD;  Location: [facility];  Service: Gynecology;  Laterality: Bilateral;  with clippings of Vaginal  mesh  . SALPINGOOPHORECTOMY Bilateral 05/31/2013   Procedure: BILATERAL SALPINGO OOPHORECTOMY/TRIM MESH;  Surgeon: Leslie Andrea, MD;  Location: [facility];  Service: Gynecology;  Laterality: Bilateral;  . TUBAL LIGATION     1995  . VAGINAL HYSTERECTOMY     1998 or 1997  . WRIST FRACTURE SURGERY  2005   Social History   Occupational History  . Not on file  Tobacco Use  . Smoking status: Never  . Smokeless tobacco: Never  Vaping Use  . Vaping Use: Never used  Substance and Sexual Activity  . Alcohol use: Not Currently    Comment: occasional; just on special occasion  . Drug use: No  . Sexual activity: Yes    Partners: Male    Birth control/protection: Surgical

## 2023-01-11 ENCOUNTER — Other Ambulatory Visit (HOSPITAL_BASED_OUTPATIENT_CLINIC_OR_DEPARTMENT_OTHER): Payer: Self-pay

## 2023-01-11 ENCOUNTER — Other Ambulatory Visit: Payer: Self-pay

## 2023-01-11 MED ORDER — TRAMADOL HCL 50 MG PO TABS
50.0000 mg | ORAL_TABLET | ORAL | 1 refills | Status: DC | PRN
  Filled 2023-01-11: qty 180, 15d supply, fill #0

## 2023-01-11 NOTE — Progress Notes (Signed)
sent 

## 2023-01-12 DIAGNOSIS — M0579 Rheumatoid arthritis with rheumatoid factor of multiple sites without organ or systems involvement: Secondary | ICD-10-CM | POA: Diagnosis not present

## 2023-01-24 ENCOUNTER — Other Ambulatory Visit (HOSPITAL_BASED_OUTPATIENT_CLINIC_OR_DEPARTMENT_OTHER): Payer: Self-pay

## 2023-01-24 ENCOUNTER — Other Ambulatory Visit: Payer: Self-pay | Admitting: *Deleted

## 2023-01-24 MED ORDER — FOLIC ACID 1 MG PO TABS
1.0000 mg | ORAL_TABLET | Freq: Every day | ORAL | 3 refills | Status: DC
  Filled 2023-01-24: qty 90, 90d supply, fill #0
  Filled 2023-04-26: qty 90, 90d supply, fill #1
  Filled 2023-07-21: qty 90, 90d supply, fill #2
  Filled 2023-10-17: qty 90, 90d supply, fill #3

## 2023-02-09 DIAGNOSIS — M0579 Rheumatoid arthritis with rheumatoid factor of multiple sites without organ or systems involvement: Secondary | ICD-10-CM | POA: Diagnosis not present

## 2023-03-02 ENCOUNTER — Other Ambulatory Visit: Payer: Self-pay

## 2023-03-02 ENCOUNTER — Ambulatory Visit: Payer: Commercial Managed Care - PPO | Admitting: Family Medicine

## 2023-03-02 VITALS — BP 144/84 | HR 65 | Ht 62.5 in

## 2023-03-02 DIAGNOSIS — G8929 Other chronic pain: Secondary | ICD-10-CM

## 2023-03-02 DIAGNOSIS — M5416 Radiculopathy, lumbar region: Secondary | ICD-10-CM | POA: Diagnosis not present

## 2023-03-02 DIAGNOSIS — M5442 Lumbago with sciatica, left side: Secondary | ICD-10-CM

## 2023-03-02 DIAGNOSIS — M533 Sacrococcygeal disorders, not elsewhere classified: Secondary | ICD-10-CM | POA: Diagnosis not present

## 2023-03-02 NOTE — Patient Instructions (Signed)
Thank you for coming in today.   You received an injection today. Seek immediate medical attention if the joint becomes red, extremely painful, or is oozing fluid.  

## 2023-03-02 NOTE — Progress Notes (Signed)
Rubin Payor, PhD, LAT, ATC acting as a scribe for Clementeen Graham, MD.  Nancy [name] is a 57 y.o. female who presents to [facility] today for exacerbation of her low back pain. Pt was seen for this issue by Dr. Denyse Amass on 08/29/22 and was given bilat SI joint steroid injections.  Today, pt reports low back pain has returned just over the 2 weeks. Pt locates pain to both sides of her low back.    She does note lumbar radiculopathy and chronic back pain.  She previously was getting back injections with Dr. Murray Hodgkins.  Her insurance does not work with [facility] so she needs a new person for back injections.  She had pretty good results with what sounds like a left L4 nerve root block and epidural steroid injection.  Dx imaging: 06/05/22 L-spine MRI 01/10/21 L-spine MRI             04/02/20 L-spine MRI  Pertinent review of systems: No fevers or chills  Relevant historical information: Asthma.  Rheumatoid arthritis.   Exam:  BP (!) 144/84   Pulse 65   Ht 5' 2.5" (1.588 m)   SpO2 95%   BMI 42.66 kg/m  General: Well Developed, well nourished, and in no acute distress.   MSK: L-spine nontender palpation midline.  Tender palpation perispinal musculature bilaterally and SI joints bilaterally.    Lab and Radiology Results  Procedure: Real-time Ultrasound Guided Injection of right SI joint Device: Philips Affiniti 50G/GE Logiq Images permanently stored and available for review in PACS Verbal informed consent obtained.  Discussed risks and benefits of procedure. Warned about infection, bleeding, hyperglycemia damage to structures among others. Patient expresses understanding and agreement Time-out conducted.   Noted no overlying erythema, induration, or other signs of local infection.   Skin prepped in a sterile fashion.   Local anesthesia: Topical Ethyl chloride.   With sterile technique and under real time ultrasound guidance: 40 mg of  Kenalog and 2 mL of Marcaine injected into SI joint. Fluid seen entering the joint capsule.   Completed without difficulty   Pain immediately resolved suggesting accurate placement of the medication.   Advised to call if fevers/chills, erythema, induration, drainage, or persistent bleeding.   Images permanently stored and available for review in the ultrasound unit.  Impression: Technically successful ultrasound guided injection.   Procedure: Real-time Ultrasound Guided Injection of left SI joint Device: Philips Affiniti 50G/GE Logiq Images permanently stored and available for review in PACS Verbal informed consent obtained.  Discussed risks and benefits of procedure. Warned about infection, bleeding, hyperglycemia damage to structures among others. Patient expresses understanding and agreement Time-out conducted.   Noted no overlying erythema, induration, or other signs of local infection.   Skin prepped in a sterile fashion.   Local anesthesia: Topical Ethyl chloride.   With sterile technique and under real time ultrasound guidance: 40 mg of Kenalog and 2 mL of Marcaine injected into SI joint. Fluid seen entering the joint capsule.   Completed without difficulty   Pain immediately resolved suggesting accurate placement of the medication.   Advised to call if fevers/chills, erythema, induration, drainage, or persistent bleeding.   Images permanently stored and available for review in the ultrasound unit.  Impression: Technically successful ultrasound guided injection.         Assessment and Plan: 57 y.o. female with bilateral low back pain thought to be SI joint pain and dysfunction.  Plan for steroid injection bilateral  SI joints.  She needs a new pain management/PMNR doctor for back injections.  Refer to Dr. Alvester Morin.  Suggests nerve root block and epidural steroid injection left L4 epidural steroid injection as a potential starting point.   PDMP not reviewed this  encounter. Orders Placed This Encounter  Procedures   [place] LIMITED JOINT SPACE STRUCTURES LOW BILAT(NO LINKED CHARGES)    Order Specific Question:   Reason for Exam (SYMPTOM  OR DIAGNOSIS REQUIRED)    Answer:   bilateral SI joint pain    Order Specific Question:   Preferred imaging location?    Answer:   [facility] referral to Physical Medicine Rehab    Referral Priority:   Routine    Referral Type:   Rehabilitation    Referral Reason:   Specialty Services Required    Referred to Provider:   Tyrell Antonio, MD    Requested Specialty:   Physical Medicine and Rehabilitation    Number of Visits Requested:   1   No orders of the defined types were placed in this encounter.    Discussed warning signs or symptoms. Please see discharge instructions. Patient expresses understanding.   The above documentation has been reviewed and is accurate and complete Clementeen Graham, M.D.

## 2023-03-03 ENCOUNTER — Telehealth: Payer: Self-pay | Admitting: Physical Medicine and Rehabilitation

## 2023-03-03 NOTE — Telephone Encounter (Signed)
Patient called needing to schedule an appointment with Dr. Alvester Morin. The number to contact patient is [phone]

## 2023-03-06 ENCOUNTER — Other Ambulatory Visit: Payer: Self-pay

## 2023-03-06 DIAGNOSIS — E559 Vitamin D deficiency, unspecified: Secondary | ICD-10-CM

## 2023-03-06 DIAGNOSIS — M0579 Rheumatoid arthritis with rheumatoid factor of multiple sites without organ or systems involvement: Secondary | ICD-10-CM

## 2023-03-06 DIAGNOSIS — Z0000 Encounter for general adult medical examination without abnormal findings: Secondary | ICD-10-CM

## 2023-03-06 DIAGNOSIS — E785 Hyperlipidemia, unspecified: Secondary | ICD-10-CM

## 2023-03-06 DIAGNOSIS — E538 Deficiency of other specified B group vitamins: Secondary | ICD-10-CM

## 2023-03-06 NOTE — Telephone Encounter (Signed)
Spoke with patient and scheduled OV for 03/17/23.

## 2023-03-08 ENCOUNTER — Other Ambulatory Visit (INDEPENDENT_AMBULATORY_CARE_PROVIDER_SITE_OTHER): Payer: Commercial Managed Care - PPO

## 2023-03-08 DIAGNOSIS — E785 Hyperlipidemia, unspecified: Secondary | ICD-10-CM

## 2023-03-08 DIAGNOSIS — E538 Deficiency of other specified B group vitamins: Secondary | ICD-10-CM

## 2023-03-08 DIAGNOSIS — M0579 Rheumatoid arthritis with rheumatoid factor of multiple sites without organ or systems involvement: Secondary | ICD-10-CM | POA: Diagnosis not present

## 2023-03-08 DIAGNOSIS — E559 Vitamin D deficiency, unspecified: Secondary | ICD-10-CM | POA: Diagnosis not present

## 2023-03-08 DIAGNOSIS — Z0000 Encounter for general adult medical examination without abnormal findings: Secondary | ICD-10-CM | POA: Diagnosis not present

## 2023-03-08 LAB — CBC WITH DIFFERENTIAL/PLATELET
Basophils Absolute: 0 10*3/uL (ref 0.0–0.1)
Basophils Relative: 0.4 % (ref 0.0–3.0)
Eosinophils Absolute: 0 10*3/uL (ref 0.0–0.7)
Eosinophils Relative: 0.6 % (ref 0.0–5.0)
HCT: 43.5 % (ref 36.0–46.0)
Hemoglobin: 13.7 g/dL (ref 12.0–15.0)
Lymphocytes Relative: 23 % (ref 12.0–46.0)
Lymphs Abs: 1.8 10*3/uL (ref 0.7–4.0)
MCHC: 31.6 g/dL (ref 30.0–36.0)
MCV: 83.2 fl (ref 78.0–100.0)
Monocytes Absolute: 0.7 10*3/uL (ref 0.1–1.0)
Monocytes Relative: 8.5 % (ref 3.0–12.0)
Neutro Abs: 5.2 10*3/uL (ref 1.4–7.7)
Neutrophils Relative %: 67.5 % (ref 43.0–77.0)
Platelets: 213 10*3/uL (ref 150.0–400.0)
RBC: 5.22 Mil/uL — ABNORMAL HIGH (ref 3.87–5.11)
RDW: 14.2 % (ref 11.5–15.5)
WBC: 7.7 10*3/uL (ref 4.0–10.5)

## 2023-03-08 LAB — LIPID PANEL
Cholesterol: 220 mg/dL — ABNORMAL HIGH (ref 0–200)
HDL: 80.3 mg/dL (ref >39.00)
LDL Cholesterol: 122 mg/dL — ABNORMAL HIGH (ref 0–99)
NonHDL: 140.16
Total CHOL/HDL Ratio: 3
Triglycerides: 92 mg/dL (ref 0.0–149.0)
VLDL: 18.4 mg/dL (ref 0.0–40.0)

## 2023-03-08 LAB — COMPREHENSIVE METABOLIC PANEL
ALT: 13 U/L (ref 0–35)
AST: 13 U/L (ref 0–37)
Albumin: 4.3 g/dL (ref 3.5–5.2)
Alkaline Phosphatase: 90 U/L (ref 39–117)
BUN: 20 mg/dL (ref 6–23)
CO2: 33 mEq/L — ABNORMAL HIGH (ref 19–32)
Calcium: 10 mg/dL (ref 8.4–10.5)
Chloride: 95 mEq/L — ABNORMAL LOW (ref 96–112)
Creatinine, Ser: 0.76 mg/dL (ref 0.40–1.20)
GFR: 85.69 mL/min (ref >60.00)
Glucose, Bld: 89 mg/dL (ref 70–99)
Potassium: 4.2 mEq/L (ref 3.5–5.1)
Sodium: 137 mEq/L (ref 135–145)
Total Bilirubin: 0.5 mg/dL (ref 0.2–1.2)
Total Protein: 7.4 g/dL (ref 6.0–8.3)

## 2023-03-08 LAB — VITAMIN D 25 HYDROXY (VIT D DEFICIENCY, FRACTURES): VITD: 73.82 ng/mL (ref 30.00–100.00)

## 2023-03-08 LAB — SEDIMENTATION RATE: Sed Rate: 68 mm/hr — ABNORMAL HIGH (ref 0–30)

## 2023-03-08 LAB — VITAMIN B12: Vitamin B-12: 650 pg/mL (ref 211–911)

## 2023-03-09 ENCOUNTER — Encounter: Payer: Self-pay | Admitting: Family Medicine

## 2023-03-09 ENCOUNTER — Ambulatory Visit (INDEPENDENT_AMBULATORY_CARE_PROVIDER_SITE_OTHER): Payer: Commercial Managed Care - PPO | Admitting: Family Medicine

## 2023-03-09 VITALS — BP 120/68 | HR 74 | Temp 97.0°F | Ht 62.5 in | Wt 234.0 lb

## 2023-03-09 DIAGNOSIS — Z0000 Encounter for general adult medical examination without abnormal findings: Secondary | ICD-10-CM | POA: Diagnosis not present

## 2023-03-09 DIAGNOSIS — E785 Hyperlipidemia, unspecified: Secondary | ICD-10-CM | POA: Diagnosis not present

## 2023-03-09 DIAGNOSIS — E538 Deficiency of other specified B group vitamins: Secondary | ICD-10-CM | POA: Diagnosis not present

## 2023-03-09 DIAGNOSIS — M797 Fibromyalgia: Secondary | ICD-10-CM

## 2023-03-09 DIAGNOSIS — D849 Immunodeficiency, unspecified: Secondary | ICD-10-CM | POA: Diagnosis not present

## 2023-03-09 DIAGNOSIS — M0579 Rheumatoid arthritis with rheumatoid factor of multiple sites without organ or systems involvement: Secondary | ICD-10-CM

## 2023-03-09 DIAGNOSIS — E559 Vitamin D deficiency, unspecified: Secondary | ICD-10-CM | POA: Diagnosis not present

## 2023-03-09 NOTE — Patient Instructions (Addendum)
Let us know when you get your flu/COVID vaccine.  Consider myfitnesspal if you feel like you hit a plateau on weight loss- but good job with being 3 lbs down in last few months  Recommended follow up: Return in about 1 year (around 03/08/2024) for physical or sooner if needed.Schedule b4 you leave.

## 2023-03-09 NOTE — Progress Notes (Signed)
Phone [phone]   Subjective:  Patient presents today for their annual physical. Chief complaint-noted.   See problem oriented charting- ROS- full  review of systems was completed and negative except for: continues to hurt in low back and left leg but improved, numbness in ttoes, sciatica pain is improved- worse with movement at night  The following were reviewed and entered/updated in epic: Past Medical History:  Diagnosis Date   Anemia    Asthma    Bradycardia    Chicken pox    Dysrhythmia    occ. palpitations   Fibromyalgia    Headache(784.0)    Migraines    Neck pain    bulging disk   Polyarthralgia 02/21/2019   PONV (postoperative nausea and vomiting)    sometimes wakes up after surgery and cannot breath due to asthma, can be bradycardic   Rheumatoid arthritis involving multiple sites with positive rheumatoid factor (HCC) 02/21/2019   Shortness of breath    with bradycardia   Patient Active Problem List   Diagnosis Date Noted   Immunosuppressed status (HCC) 12/26/2019    Priority: High   Rheumatoid arthritis involving multiple sites with positive rheumatoid factor (HCC) 02/21/2019    Priority: High   Fibromyalgia 04/12/2018    Priority: High   Hyperlipidemia 12/25/2019    Priority: Medium    Vaginal dryness 04/12/2018    Priority: Medium    Morbid obesity (HCC) 04/12/2018    Priority: Medium    History of colon polyps 04/12/2018    Priority: Medium    Primary osteoarthritis of both knees 12/28/2017    Priority: Medium    Mild intermittent asthma 11/20/2013    Priority: Medium    Bradycardia 07/04/2012    Priority: Medium    Vitamin B12 deficiency 02/14/2019    Priority: Low   Vitamin D deficiency 04/12/2018    Priority: Low   History of total hysterectomy 04/12/2018    Priority: Low   Insomnia 04/12/2018    Priority: Low   Cervicogenic headache 03/26/2014    Priority: Low   Cervical radiculopathy 03/26/2014    Priority: Low   Asthma 03/07/2022    Urge incontinence 12/20/2018   Urinary frequency 12/20/2018   Past Surgical History:  Procedure Laterality Date   ABDOMINAL HYSTERECTOMY     BACK SURGERY     cervical fusion C5,6,7 with a bulge at [facility]   BLADDER SURGERY  July 10th 2013   CESAREAN SECTION     1992 and 1995   CHOLECYSTECTOMY N/A 10/07/2021   Procedure: LAPAROSCOPIC CHOLECYSTECTOMY;  Surgeon: Abigail Miyamoto, MD;  Location: [facility];  Service: General;  Laterality: N/A;   COLONOSCOPY  11/27/2015   Dr.Pyrtle   CYSTOSCOPY N/A 07/31/2018   Procedure: Bluford Kaufmann;  Surgeon: Alfredo Martinez, MD;  Location: [facility];  Service: Urology;  Laterality: N/A;   DIAGNOSTIC LAPAROSCOPY     EXCISION OF MESH N/A 07/31/2018   Procedure: REMOVAL OF VAGINAL MESH;  Surgeon: Alfredo Martinez, MD;  Location: [facility];  Service: Urology;  Laterality: N/A;   LAPAROSCOPY Bilateral 05/31/2013   Procedure: LAPAROSCOPY withbilateral salpingo-oophorectomy, lysis of adhesions;  Surgeon: Leslie Andrea, MD;  Location: [facility];  Service: Gynecology;  Laterality: Bilateral;  with clippings of Vaginal  mesh   SALPINGOOPHORECTOMY Bilateral 05/31/2013   Procedure: BILATERAL SALPINGO OOPHORECTOMY/TRIM MESH;  Surgeon: Leslie Andrea, MD;  Location: [facility];  Service: Gynecology;  Laterality: Bilateral;   TUBAL LIGATION     1995   VAGINAL  HYSTERECTOMY     1998 or 1997   WRIST FRACTURE SURGERY  2005    Family History  Problem Relation Age of Onset   Colon polyps Mother    Hyperlipidemia Mother        38 in 2019   Arthritis Mother    Osteoporosis Mother    Lumbar disc disease Mother        herniated discs.    COPD Mother    Hypertension Father        doesnt know his history well- no recent contact   Other Sister        doesnt know history well   Lumbar disc disease Brother        grew up with him    Other Brother        doesnt know history well   Heart disease Maternal Grandmother    Diabetes Maternal Grandmother     Cervical cancer Maternal Grandmother    Colon cancer Maternal Grandfather    Heart disease Maternal Grandfather    Cancer - Colon Maternal Grandfather    Diabetes Paternal Grandmother    Lung cancer Paternal Grandmother        dad's parents stepped in to help when dad not present   Atrial fibrillation Paternal Grandfather        required a few shocks   Bradycardia Paternal Grandfather    Hypertension Paternal Grandfather    Esophageal cancer Neg Hx    Stomach cancer Neg Hx    Rectal cancer Neg Hx     Medications- reviewed and updated Current Outpatient Medications  Medication Sig Dispense Refill   Abatacept (ORENCIA IV) Inject into the vein.     albuterol (VENTOLIN HFA) 108 (90 Base) MCG/ACT inhaler Inhale 2 puffs into the lungs every 6 (six) hours as needed for wheezing or shortness of breath. 6.7 g 3   Calcium Carbonate (CALTRATE 600 PO) Take by mouth.     Cholecalciferol (VITAMIN D3) 125 MCG (5000 UT) CAPS Take by mouth daily.     cyanocobalamin 1000 MCG tablet Take 1,000 mcg by mouth daily.     estradiol (ESTRACE) 0.1 MG/GM vaginal cream Insert 1/2 gram into vagina 1 to 3 times per week. Short course before starting tablets back 42.5 g 0   Estradiol 10 MCG TABS vaginal tablet Place 1 tablet (10 mcg total) vaginally 2 (two) times a week. 24 tablet 3   folic acid (FOLVITE) 1 MG tablet Take 1 tablet (1 mg total) by mouth daily. 90 tablet 3   ibuprofen (ADVIL) 800 MG tablet Take 1 tablet (800 mg total) by mouth every 8 (eight) hours as needed. 30 tablet 0   leflunomide (ARAVA) 20 MG tablet Take 1 tablet (20 mg total) by mouth daily. 90 tablet 3   levocetirizine (XYZAL) 5 MG tablet Take 1 tablet (5 mg total) by mouth every evening. 90 tablet 1   melatonin 5 MG TABS 1 tablet at bedtime as needed with food     Turmeric 500 MG CAPS Take by mouth 2 (two) times daily.     valACYclovir (VALTREX) 1000 MG tablet Take 1 tablet (1,000 mg total) by mouth 2 (two) times daily. 30 tablet 0    vitamin C (ASCORBIC ACID) 500 MG tablet Take 500 mg by mouth as needed.     gabapentin (NEURONTIN) 300 MG capsule Take 1 capsule by mouth each morning and 1 capsule each afternoon and 2 capsules before bed as directed 360 capsule 3  hydrochlorothiazide (HYDRODIURIL) 25 MG tablet TAKE 1 TABLET BY MOUTH DAILY AS NEEDED (FLUID RETENTION/EDEMA). 90 tablet 1   ondansetron (ZOFRAN-ODT) 4 MG disintegrating tablet Take 1 tablet (4 mg total) by mouth every 8 (eight) hours as needed for nausea or vomiting. (Patient not taking: Reported on 03/09/2023) 20 tablet 0   traMADol (ULTRAM) 50 MG tablet Take 1-2 tablets (50-100 mg total) by mouth every 4-6 hours as needed for moderate pain (max of 8 per day) (Patient not taking: Reported on 03/09/2023) 180 tablet 1   No current facility-administered medications for this visit.    Allergies-reviewed and updated Allergies  Allergen Reactions   Advair Diskus [Fluticasone-Salmeterol] Cough    excessive   Erythromycin Diarrhea and Nausea And Vomiting   Percocet [Oxycodone-Acetaminophen] Other (See Comments)    hallucinations    Social History   Social History Narrative   Married to husband Onalee Hua. Son Thayer Ohm and Daughter Delfino Lovett.       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       Hobbies: movies, reading, hiking   Right handed   Objective  Objective:  BP 120/68   Pulse 74   Temp (!) 97 F (36.1 C)   Ht 5' 2.5" (1.588 m)   Wt 234 lb (106.1 kg)   SpO2 95%   BMI 42.12 kg/m  Gen: NAD, resting comfortably HEENT: Mucous membranes are moist. Oropharynx normal Neck: no thyromegaly CV: RRR no murmurs rubs or gallops Lungs: CTAB no crackles, wheeze, rhonchi Abdomen: soft/nontender/nondistended/normal bowel sounds. No rebound or guarding.  Ext: no edema Skin: warm, dry Neuro: grossly normal, moves all extremities, PERRLA   Assessment and Plan   57 y.o. female presenting for annual physical.  Health Maintenance counseling: 1. Anticipatory guidance: Patient  counseled regarding regular dental exams -q6 months, eye exams - yearly,  avoiding smoking and second hand smoke , limiting alcohol to 1 beverage per day- rare social , no illicit drugs .   2. Risk factor reduction:  Advised patient of need for regular exercise and diet rich and fruits and vegetables to reduce risk of heart attack and stroke.  Exercise- limited by pain lately.  Diet/weight management-weight within 3 pounds of last year and down 3 pounds from May- has tried to cut back on carbohydrates and do more fruits/vegetables as a family - consider myfitnesspal.  Wt Readings from Last 3 Encounters:  03/09/23 234 lb (106.1 kg)  12/01/22 237 lb (107.5 kg)  09/06/22 226 lb (102.5 kg)  3. Immunizations/screenings/ancillary studies-she will let us know when gets follow-up flu shot.  Discussed Shingrix- was told insurance doesn't cover until 58 so waiting.  Discussed Prevnar 20 with immunosuppression- wants to hold off  Immunization History  Administered Date(s) Administered   Influenza,inj,Quad PF,6+ Mos 04/24/2018, 05/24/2022   Influenza-Unspecified 05/02/2015, 05/04/2016, 05/08/2017, 05/03/2019, 05/22/2020, 05/07/2021   PFIZER(Purple Top)SARS-COV-2 Vaccination 07/24/2019, 08/12/2019, 08/08/2020   Tdap 08/26/2015  4. Cervical cancer screening- hysterectomy for benign reasons- was told did not require pap smears- if had issues could see Dr. Oscar La again  5. Breast cancer screening-  breast exam - prefers self exams- and mammogram 02/10/22 3d - plans to schedule- she will call  6. Colon cancer screening - History of sessile serrated polyp without dysplasia-5-year repeat needed after March 14 2022 colonoscopy- 2028 repeat  7. Skin cancer screening- no dermatologist- wants referral. advised regular sunscreen use. Denies worrisome, changing, or new skin lesions.  8. Birth control/STD check- hysterectomy and monogamous  9. Osteoporosis screening at 63- 05/20/21-  with long term prednisone- will check  every 2 years did have osteopenia- takes vitamin D 5000 units daily also gets fair amount of calcium in diet-not yet due  10. Smoking associated screening - never smoker  Status of chronic or acute concerns   # Rheumatoid arthritis/osteoarthritis of knee and back [place] -patient continues to work closely with Dr. Laury Axon last visit in April leflunomide and Orencia seemed to be helping -Ongoing issues with her back as well as as joint-and has required epidural injections as well as SI joint injections- has found these helpful. Is on gabapentin 300 mg am, afternoon and 600mg  at bedtime -most recent ESR up to 68 but had been as low as the 20's so this is a slight jump.  -not needing ibuprofen much lately- had bene using more tramadol until injections - has also done monovisc -with orthopedic and rheum issues we will complete Family Medical Leave Act (FMLA) paperwork when comes in- has intermittent absences related to pain and needed treatments   # HRT-remains on estradiol vaginally as needed but also takes tablets twice a week scheduled  # Edema-remains on hydrochlorothiazide to 2 days/week - needs more with travel to hot environments like [place]  # Cold sores-Valtrex as needed   #hyperlipidemia S: Medication:none  -Did EKG in 2023 for some palpitations- about the same- no worse as last year  The 10-year ASCVD risk score (Arnett DK, et al., 2019) is: 2.9%  Lab Results  Component Value Date   CHOL 220 (H) 03/08/2023   HDL 80.30 03/08/2023   LDLCALC 122 (H) 03/08/2023   TRIG 92.0 03/08/2023   CHOLHDL 3 03/08/2023   A/P: cholesterol mildly elevated but not in range for medicine- continue healthy eating and regular exercise as able    #Vitamin D deficiency S: Medication: 5000 units/day Last vitamin D Lab Results  Component Value Date   VD25OH 73.82 03/08/2023  A/P: stable- continue current medicines    # B12 deficiency S: Current treatment/medication (oral vs. IM): B12 1000  mcg daily  Lab Results  Component Value Date   VITAMINB12 650 03/08/2023   A/P: stable- continue current medicines    #asthma- not needing albuterol lately  #allergies- xyzal as needed   #High bicarbonate slightly- discussed could be related to sleep apnea- she want sto hold off on evaluation for now- husband has noted snoring  Recommended follow up: Return in about 1 year (around 03/08/2024) for physical or sooner if needed.Schedule b4 you leave. Future Appointments  Date Time Provider Department Center  03/17/2023  9:15 AM Juanda Chance, NP [facility] None   Lab/Order associations:already did labs fasting   ICD-10-CM   1. Preventative health care  Z00.00     2. Fibromyalgia  M79.7     3. Rheumatoid arthritis involving multiple sites with positive rheumatoid factor (HCC)  M05.79     4. Immunosuppressed status (HCC)  D84.9     5. Morbid obesity (HCC)  E66.01     6. Hyperlipidemia, unspecified hyperlipidemia type  E78.5     7. Vitamin D deficiency  E55.9     8. Vitamin B12 deficiency  E53.8       No orders of the defined types were placed in this encounter.   Return precautions advised.  Tana Conch, MD

## 2023-03-13 ENCOUNTER — Other Ambulatory Visit: Payer: Self-pay | Admitting: Family Medicine

## 2023-03-13 DIAGNOSIS — Z1231 Encounter for screening mammogram for malignant neoplasm of breast: Secondary | ICD-10-CM

## 2023-03-15 ENCOUNTER — Other Ambulatory Visit (HOSPITAL_BASED_OUTPATIENT_CLINIC_OR_DEPARTMENT_OTHER): Payer: Self-pay

## 2023-03-15 ENCOUNTER — Other Ambulatory Visit: Payer: Self-pay | Admitting: *Deleted

## 2023-03-15 MED ORDER — LEFLUNOMIDE 20 MG PO TABS
20.0000 mg | ORAL_TABLET | Freq: Every day | ORAL | 3 refills | Status: DC
  Filled 2023-03-15: qty 90, 90d supply, fill #0
  Filled 2023-06-15: qty 90, 90d supply, fill #1
  Filled 2023-09-13: qty 90, 90d supply, fill #2
  Filled 2023-12-08: qty 90, 90d supply, fill #3

## 2023-03-16 ENCOUNTER — Encounter: Payer: Self-pay | Admitting: Family Medicine

## 2023-03-16 DIAGNOSIS — M5416 Radiculopathy, lumbar region: Secondary | ICD-10-CM | POA: Insufficient documentation

## 2023-03-16 DIAGNOSIS — M1990 Unspecified osteoarthritis, unspecified site: Secondary | ICD-10-CM | POA: Diagnosis not present

## 2023-03-16 DIAGNOSIS — G8929 Other chronic pain: Secondary | ICD-10-CM | POA: Insufficient documentation

## 2023-03-16 DIAGNOSIS — Z79899 Other long term (current) drug therapy: Secondary | ICD-10-CM | POA: Diagnosis not present

## 2023-03-16 DIAGNOSIS — M0579 Rheumatoid arthritis with rheumatoid factor of multiple sites without organ or systems involvement: Secondary | ICD-10-CM | POA: Diagnosis not present

## 2023-03-16 DIAGNOSIS — M797 Fibromyalgia: Secondary | ICD-10-CM | POA: Diagnosis not present

## 2023-03-17 ENCOUNTER — Encounter: Payer: Self-pay | Admitting: Physical Medicine and Rehabilitation

## 2023-03-17 ENCOUNTER — Ambulatory Visit: Payer: Commercial Managed Care - PPO | Admitting: Physical Medicine and Rehabilitation

## 2023-03-17 DIAGNOSIS — M797 Fibromyalgia: Secondary | ICD-10-CM | POA: Diagnosis not present

## 2023-03-17 DIAGNOSIS — G8929 Other chronic pain: Secondary | ICD-10-CM | POA: Diagnosis not present

## 2023-03-17 DIAGNOSIS — M545 Low back pain, unspecified: Secondary | ICD-10-CM

## 2023-03-17 DIAGNOSIS — M47816 Spondylosis without myelopathy or radiculopathy, lumbar region: Secondary | ICD-10-CM

## 2023-03-17 NOTE — Progress Notes (Unsigned)
Functional Pain Scale - descriptive words and definitions  Unmanageable (7)  Pain interferes with normal ADL's/nothing seems to help/sleep is very difficult/active distractions are very difficult to concentrate on. Severe range order  Average Pain  varies  Lower back pain on both sides with radiation in the left leg to the foot

## 2023-03-17 NOTE — Progress Notes (Unsigned)
Nancy [name] - 57 y.o. female MRN [id number]  Date of birth: 03/16/1966  Office Visit Note: Visit Date: 03/17/2023 PCP: Shelva Majestic, MD Referred by: Shelva Majestic, MD  Subjective: Chief Complaint  Patient presents with   Lower Back - Pain   HPI: Nancy [name] is a 57 y.o. female who comes in today per the request of Dr. Clementeen Graham for evaluation of bilateral lower back pain, left greater than right. Pain ongoing for 2 years, worsens with standing and activity. Severe pain with household tasks such as vacuuming and sweeping. She describes pain as constant sore and pressure sensation, currently rates as 4 out of 10. Some relief of pain with home exercise regimen, heating pad/ice, rest and medications. Patient is currently prescribed Tramadol by her PCP Dr. Tana Conch. Lumbar MRI imaging from 2023 does shows advanced hypertrophic facet degenerative changes with bilateral joint effusion and ligamentum flavum redundancy at L4-L5. Moderate facet hypertrophy at L5-S1. Patient was previous patent of Dr. Callie Fielding, history of multiple epidural steroid injections, most recent was left L5 transforaminal epidural steroid injection in March of this year, she reports good relief of radicular leg pain with this injection. She also underwent bilateral sacroiliac joint injections with Dr. Clementeen Graham on 03/02/2023, she reports good relief of lower back pain with this procedure, however pain relief short lived. Patient is currently being managed by Dr. Casimer Lanius with Rheumatology at [facility]. Of note, she does have history of fibromyalgia, currently being managed on Gabapentin. Patient denies focal weakness, numbness and tingling. No recent trauma or falls.     Oswestry Disability Index Score 24% 10 to 20 (40%) moderate disability: The patient experiences more pain and difficulty with sitting, lifting and standing. Travel and social life are more difficult, and  they may be disabled from work. Personal care, sexual activity and sleeping are not grossly affected, and the patient can usually be managed by conservative means.  Review of Systems  Musculoskeletal:  Positive for back pain and myalgias.  Neurological:  Negative for tingling, sensory change, focal weakness and weakness.  All other systems reviewed and are negative.  Otherwise per HPI.  Assessment & Plan: Visit Diagnoses:    ICD-10-CM   1. Chronic bilateral low back pain without sciatica  M54.50 Ambulatory referral to Physical Medicine Rehab   G89.29     2. Facet arthropathy, lumbar  M47.816 Ambulatory referral to Physical Medicine Rehab    3. Fibromyalgia  M79.7 Ambulatory referral to Physical Medicine Rehab       Plan: Findings:  Chronic, worsening and severe bilateral lower back pain, left greater than right. Patient continues to have severe pain despite good conservative therapies such as home exercise regimen, rest and use of medications. Patients clinical presentation and exam are consistent with facet mediated pain. There is advanced hypertrophic facet changes with bilateral joint effusion at L4-L5, moderate facet arthropathy at L5-S1. She does have pain with lumbar extension today. I also feel her fibromyalgia could be working to exacerbate her symptoms. Next step is to perform diagnostic bilateral L4-L5 and L5-S1 medial branch blocks under fluoroscopic guidance. If good relief of pain with diagnotic facet blocks we did discuss possibility of longer sustained pain relief with radiofrequency ablation. I explained ablation procedure in detail today and provided her with educational material to take home and review. If we feel her pain is more radicular in nature would consider repeating lumbar epidural steroid injection. Patient has no questions at  this time. No red red flag symptoms noted upon exam today.     Meds & Orders: No orders of the defined types were placed in this encounter.    Orders Placed This Encounter  Procedures   Ambulatory referral to Physical Medicine Rehab    Follow-up: Return for Bilateral L4-L5 and L5-S1 medial branch blocks.   Procedures: No procedures performed      Clinical History: CLINICAL DATA:  Lumbar radiculitis.   EXAM: MRI LUMBAR SPINE WITHOUT CONTRAST   TECHNIQUE: Multiplanar, multisequence MR imaging of the lumbar spine was performed. No intravenous contrast was administered.   COMPARISON:  MRI of the lumbar spine January 10, 2021.   FINDINGS: Segmentation:  Standard.   Alignment: Trace anterolisthesis of L4 over L5. Trace retrolisthesis at L2-3 and L3-4.   Vertebrae: No fracture, evidence of discitis, or aggressive bone lesion. Hemangiomas are seen at T11, L1, L4 and L5.   Conus medullaris and cauda equina: Conus extends to the L1 level. Small Filar lipoma, unchanged. Conus appear normal.   Paraspinal and other soft tissues: Negative.   Disc levels:   T12-L1: No spinal canal or neural foraminal stenosis.   L1-2: No spinal canal or neural foraminal stenosis.   L2-3: Shallow disc bulge with tiny right foraminal disc protrusion, mild facet degenerative changes with mild bilateral joint effusion. No significant spinal canal or neural foraminal stenosis. No significant change from prior.   L3-4: Disc bulge with superimposed small left central to subarticular disc protrusion causing indentation on the thecal sac and resulting in mild-to-moderate left subarticular zone stenosis, mildly progressed compared to prior. Mild facet degenerative changes with mild bilateral joint effusion. No significant neural foraminal narrowing.   L4-5: Disc bulge, advanced hypertrophic facet degenerative changes with bilateral joint effusion and ligamentum flavum redundancy resulting in mild spinal canal stenosis with mild narrowing of the right subarticular zone and mild right neural foraminal narrowing. Compared to prior MRI, there is  progression of joint effusion with significant improvement of associated periarticular and marrow edema.   L5-S1: Right asymmetric disc bulge and moderate facet degenerative changes resulting in mild right neural foraminal narrowing. No spinal canal stenosis.   IMPRESSION: 1. Mild progression of degenerative changes at L3-4 with mild-to-moderate left subarticular zone stenosis. 2. Mild spinal canal stenosis with mild narrowing of the right subarticular zone and mild right neural foraminal narrowing at L4-5. 3. Mild right neural foraminal narrowing at L5-S1.     Electronically Signed   By: Baldemar Lenis M.D.   On: 06/08/2022 08:15   She reports that she has never smoked. She has never used smokeless tobacco. No results for input(s): "HGBA1C", "LABURIC" in the last 8760 hours.  Objective:  VS:  HT:    WT:   BMI:     BP:   HR: bpm  TEMP: ( )  RESP:  Physical Exam Vitals and nursing note reviewed.  HENT:     Head: Normocephalic and atraumatic.     Right Ear: External ear normal.     Left Ear: External ear normal.     Nose: Nose normal.     Mouth/Throat:     Mouth: Mucous membranes are moist.  Eyes:     Extraocular Movements: Extraocular movements intact.  Cardiovascular:     Rate and Rhythm: Normal rate.     Pulses: Normal pulses.  Pulmonary:     Effort: Pulmonary effort is normal.  Abdominal:     General: Abdomen is flat. There is  no distension.  Musculoskeletal:        General: Tenderness present.     Cervical back: Normal range of motion.     Comments: Patient rises from seated position to standing without difficulty. Pain noted with facet loading and lumbar extension. 5/5 strength noted with bilateral hip flexion, knee flexion/extension, ankle dorsiflexion/plantarflexion and EHL. No clonus noted bilaterally. No pain upon palpation of greater trochanters. No pain with internal/external rotation of bilateral hips. Sensation intact bilaterally. Negative  slump test bilaterally. Ambulates without aid, gait steady.     Skin:    General: Skin is warm and dry.     Capillary Refill: Capillary refill takes less than 2 seconds.  Neurological:     General: No focal deficit present.     Mental Status: She is alert and oriented to person, place, and time.  Psychiatric:        Mood and Affect: Mood normal.        Behavior: Behavior normal.     Ortho Exam  Imaging: No results found.  Past Medical/Family/Surgical/Social History: Medications & Allergies reviewed per EMR, new medications updated. 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   Vitamin B12 deficiency 02/14/2019   Urge incontinence 12/20/2018   Urinary frequency 12/20/2018   Fibromyalgia 04/12/2018   Vitamin D deficiency 04/12/2018   Vaginal dryness 04/12/2018   History of total hysterectomy 04/12/2018   Insomnia 04/12/2018   Morbid obesity (HCC) 04/12/2018   History of colon polyps 04/12/2018   Primary osteoarthritis of both knees 12/28/2017   Cervicogenic headache 03/26/2014   Cervical radiculopathy 03/26/2014   Mild intermittent asthma 11/20/2013   Bradycardia 07/04/2012   Past Medical History:  Diagnosis Date   Anemia    Asthma    Bradycardia    Chicken pox    Dysrhythmia    occ. 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    COPD Mother    Hypertension Father        doesnt know his history well- no recent contact   Other Sister        doesnt know history well   Lumbar disc disease Brother        grew up with him    Other Brother        doesnt know history well   Heart disease Maternal Grandmother    Diabetes Maternal Grandmother    Cervical cancer Maternal Grandmother    Colon cancer Maternal Grandfather    Heart disease Maternal Grandfather    Cancer - Colon Maternal Grandfather  Diabetes Paternal Grandmother    Lung cancer Paternal Grandmother        dad's parents stepped in to help when dad not present   Atrial fibrillation Paternal Grandfather        required a few shocks   Bradycardia Paternal Grandfather    Hypertension Paternal Grandfather    Esophageal cancer Neg Hx    Stomach cancer Neg Hx    Rectal cancer Neg Hx    Past Surgical History:  Procedure Laterality Date   ABDOMINAL HYSTERECTOMY     BACK SURGERY     cervical fusion C5,6,7 with a bulge at [facility]   BLADDER SURGERY  July 10th 2013   CESAREAN SECTION     1992 and 1995   CHOLECYSTECTOMY N/A 10/07/2021   Procedure: LAPAROSCOPIC CHOLECYSTECTOMY;  Surgeon: Abigail Miyamoto, MD;  Location: [facility];  Service: General;  Laterality: N/A;   COLONOSCOPY  11/27/2015   Dr.Pyrtle   CYSTOSCOPY N/A 07/31/2018   Procedure: Bluford Kaufmann;  Surgeon: Alfredo Martinez, MD;  Location: [facility];  Service: Urology;  Laterality: N/A;   DIAGNOSTIC LAPAROSCOPY     EXCISION OF MESH N/A 07/31/2018   Procedure: REMOVAL OF VAGINAL MESH;  Surgeon: Alfredo Martinez, MD;  Location: [facility];  Service: Urology;  Laterality: N/A;   LAPAROSCOPY Bilateral 05/31/2013   Procedure: LAPAROSCOPY withbilateral salpingo-oophorectomy, lysis of adhesions;  Surgeon: Leslie Andrea,  MD;  Location: [facility];  Service: Gynecology;  Laterality: Bilateral;  with clippings of Vaginal  mesh   SALPINGOOPHORECTOMY Bilateral 05/31/2013   Procedure: BILATERAL SALPINGO OOPHORECTOMY/TRIM MESH;  Surgeon: Leslie Andrea, MD;  Location: [facility];  Service: Gynecology;  Laterality: Bilateral;   TUBAL LIGATION     1995   VAGINAL HYSTERECTOMY     1998 or 1997   WRIST FRACTURE SURGERY  2005   Social History   Occupational History   Not on file  Tobacco Use   Smoking status: Never   Smokeless tobacco: Never  Vaping Use   Vaping status: Never Used  Substance and Sexual Activity   Alcohol use: Not Currently    Comment: occasional; just on special occasion   Drug use: No   Sexual activity: Yes    Partners: Male    Birth control/protection: Surgical

## 2023-03-24 ENCOUNTER — Ambulatory Visit: Payer: Commercial Managed Care - PPO

## 2023-03-28 ENCOUNTER — Other Ambulatory Visit: Payer: Self-pay | Admitting: Physical Medicine and Rehabilitation

## 2023-03-28 ENCOUNTER — Other Ambulatory Visit (HOSPITAL_COMMUNITY): Payer: Self-pay

## 2023-03-28 ENCOUNTER — Ambulatory Visit: Payer: Commercial Managed Care - PPO | Admitting: Family Medicine

## 2023-03-28 ENCOUNTER — Encounter: Payer: Self-pay | Admitting: Family Medicine

## 2023-03-28 ENCOUNTER — Telehealth: Payer: Self-pay

## 2023-03-28 ENCOUNTER — Ambulatory Visit (INDEPENDENT_AMBULATORY_CARE_PROVIDER_SITE_OTHER)
Admission: RE | Admit: 2023-03-28 | Discharge: 2023-03-28 | Disposition: A | Discharge status: Home / Self Care | Payer: Commercial Managed Care - PPO | Source: Ambulatory Visit | Attending: Family Medicine | Admitting: Family Medicine

## 2023-03-28 VITALS — BP 140/72 | HR 63 | Temp 97.6°F | Ht 62.5 in | Wt 234.0 lb

## 2023-03-28 DIAGNOSIS — M79661 Pain in right lower leg: Secondary | ICD-10-CM

## 2023-03-28 DIAGNOSIS — M1711 Unilateral primary osteoarthritis, right knee: Secondary | ICD-10-CM | POA: Diagnosis not present

## 2023-03-28 MED ORDER — DIAZEPAM 5 MG PO TABS
ORAL_TABLET | ORAL | 0 refills | Status: DC
  Filled 2023-03-28: qty 2, 1d supply, fill #0

## 2023-03-28 NOTE — Progress Notes (Signed)
Phone [phone] In person visit   Subjective:   Nancy [name] is a 57 y.o. year old very pleasant female patient who presents for/with See problem oriented charting Chief Complaint  Patient presents with   right leg pain    Pt c/o right lower leg shin pain that's sharp/throbbing that she noticed Friday, it hurts to walk on it. Denies knee pain and swelling.   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    Priority: High   Fibromyalgia 04/12/2018    Priority: High   Hyperlipidemia 12/25/2019    Priority: Medium    Vaginal dryness 04/12/2018    Priority: Medium    Morbid obesity (HCC) 04/12/2018    Priority: Medium    History of colon polyps 04/12/2018    Priority: Medium    Primary osteoarthritis of both knees 12/28/2017    Priority: Medium    Mild intermittent asthma 11/20/2013    Priority: Medium    Bradycardia 07/04/2012    Priority: Medium    Vitamin B12 deficiency 02/14/2019    Priority: Low   Vitamin D deficiency 04/12/2018    Priority: Low   History of total hysterectomy 04/12/2018    Priority: Low   Insomnia 04/12/2018    Priority: Low   Cervicogenic headache 03/26/2014    Priority: Low   Cervical radiculopathy 03/26/2014    Priority: Low   Lumbar radiculopathy 03/16/2023   Asthma 03/07/2022   Urge incontinence 12/20/2018   Urinary frequency 12/20/2018    Medications- reviewed and updated Current Outpatient Medications  Medication Sig Dispense Refill   Abatacept (ORENCIA IV) Inject into the vein.     albuterol (VENTOLIN HFA) 108 (90 Base) MCG/ACT inhaler Inhale 2 puffs into the lungs every 6 (six) hours as needed for wheezing or shortness of breath. 6.7 g 3   Calcium Carbonate (CALTRATE 600 PO) Take by mouth.      Cholecalciferol (VITAMIN D3) 125 MCG (5000 UT) CAPS Take by mouth daily.     cyanocobalamin 1000 MCG tablet Take 1,000 mcg by mouth daily.     estradiol (ESTRACE) 0.1 MG/GM vaginal cream Insert 1/2 gram into vagina 1 to 3 times per week. Short course before starting tablets back 42.5 g 0   Estradiol 10 MCG TABS vaginal tablet Place 1 tablet (10 mcg total) vaginally 2 (two) times a week. 24 tablet 3   folic acid (FOLVITE) 1 MG tablet Take 1 tablet (1 mg total) by mouth daily. 90 tablet 3   ibuprofen (ADVIL) 800 MG tablet Take 1 tablet (800 mg total) by mouth every 8 (eight) hours as needed. 30 tablet 0   leflunomide (ARAVA) 20 MG tablet Take 1 tablet (20 mg total) by mouth daily. 90 tablet 3   levocetirizine (XYZAL) 5 MG tablet Take 1 tablet (5 mg total) by mouth every evening. 90 tablet 1   melatonin 5 MG TABS 1 tablet at bedtime as needed with food     Turmeric 500 MG CAPS Take by mouth 2 (two) times daily.     valACYclovir (VALTREX) 1000 MG tablet Take 1 tablet (1,000 mg total) by mouth 2 (two) times daily. 30 tablet 0   vitamin C (ASCORBIC ACID) 500 MG tablet Take 500 mg by mouth as needed.  diazepam (VALIUM) 5 MG tablet Take one tablet by mouth with food one hour prior to procedure. May repeat 30 minutes prior if needed. (Patient not taking: Reported on 03/28/2023) 2 tablet 0   gabapentin (NEURONTIN) 300 MG capsule Take 1 capsule by mouth each morning and 1 capsule each afternoon and 2 capsules before bed as directed 360 capsule 3   hydrochlorothiazide (HYDRODIURIL) 25 MG tablet TAKE 1 TABLET BY MOUTH DAILY AS NEEDED (FLUID RETENTION/EDEMA). 90 tablet 1   ondansetron (ZOFRAN-ODT) 4 MG disintegrating tablet Take 1 tablet (4 mg total) by mouth every 8 (eight) hours as needed for nausea or vomiting. (Patient not taking: Reported on 03/28/2023) 20 tablet 0   traMADol (ULTRAM) 50 MG tablet Take 1-2 tablets (50-100 mg total) by mouth every 4-6 hours as needed for moderate pain (max of 8 per day)  (Patient not taking: Reported on 03/28/2023) 180 tablet 1   No current facility-administered medications for this visit.     Objective:  BP (!) 140/72   Pulse 63   Temp 97.6 F (36.4 C)   Ht 5' 2.5" (1.588 m)   Wt 234 lb (106.1 kg)   SpO2 98%   BMI 42.12 kg/m   CV: RRR  Lungs: nonlabored, normal respiratory rate Abdomen: soft/nondistended  Ext: Trace to 1+ edema bilaterally.  Both lower legs are diffusely tender but this is per her baseline with fibromyalgia-over upper third of her shin/right tibia she is very tender to palpation out of proportion to the other areas and baseline exam Skin: warm, dry, no redness or warmth    Assessment and Plan   # Right lower leg pain S: Right lower leg pain over shin.  Describes as a sharp at times throbbing-like pain since Friday night- over weekend thought maybe saw bulge but has not continued to note that.  At first noted some thigh pain radiating down to shin but that has resolved- now just in the shin. . Hurts worse to walk on it.  No knee pain or swelling.  No calf pain or swelling.no fever or chills. No redness on leg. No trauma to area or falls. No increased walking recently above normal.   - tramadol will allow her to sleep - pain at the moment 5-6/10 when walking on it but to up to 7-8/10. Seems to be worsening.  Has had ongoing back pain but its the left side that is worse for that. Does have history of knee arthritis but pain not at the knee- very different from her typical osteoarthritis pain   A/P: 57 year old female with right lower leg/shin pain starting Friday evening without trauma or injury that is worsening particular with walking on it.  I am going to get a stat x-ray to rule out fracture-not a runner and has not increased her physical activity but she is high risk for fracture with recurrent need for steroids over the years.  Shinsplints also possible.  I want her to try to stay off the leg is much as possible.  I also asked her to  schedule with her sports medicine Dr. Denyse Amass as fracture is possible even if x-ray is normal especially early in a fracture. -No calf pain or edema on either leg-doubt DVT - No redness of her skin or warmth-doubt cellulitis or deeper infection-in addition her pain primarily over the tibia itself -I am okay with her using her baseline tramadol for pain I also encouraged trying to ice the area  # Elevated blood pressure-no known history  of hypertension but acutely in pain and elevated-she agrees to monitor this at home and let me know if remains elevated as pain trends down  Recommended follow up: Return for as needed for new, worsening, persistent symptoms. Future Appointments  Date Time Provider Department Center  04/06/2023  7:30 AM [facility] 2 [facility]  04/10/2023  8:30 AM Tyrell Antonio, MD [facility] None    Lab/Order associations:   ICD-10-CM   1. Pain in right lower leg  M79.661 DG Tibia/Fibula Right    CANCELED: DG Tibia/Fibula Right      Time Spent: 21 minutes of total time (1:53 PM- 2:14 PM) was spent on the date of the encounter performing the following actions: Brief chart review prior to seeing the patient, obtaining history, performing a medically necessary exam, counseling on the treatment plan including reasoning for sports medicine visit even if x-ray is reassuring, placing orders, and documenting in our EHR.    Return precautions advised.  Tana Conch, MD

## 2023-03-28 NOTE — Telephone Encounter (Signed)
Patient is scheduled for injection 04/10/23. Needs pre procedure Valium sent to [facility]

## 2023-03-28 NOTE — Patient Instructions (Addendum)
Please go to [place]  central X-ray  - located [address] across the street from [place] - in the basement - Hours: 8:30-5:00 PM M-F (with lunch from 12:30- 1 PM). You do NOT need an appointment.    Even if x-ray shows no fracture or stress fracture id like you to try to minimize being on your feet and likely schedule follow up in a few days with Dr. Denyse Amass for his opinion on this and if repeat imaging needed -ok to use tramadol for pain  - calf pain/swelling/fever/chills/redness- any of these let me know ASAP so we can consider next steps  Recommended follow up: Return for as needed for new, worsening, persistent symptoms.

## 2023-03-29 ENCOUNTER — Ambulatory Visit: Payer: Commercial Managed Care - PPO | Admitting: Family Medicine

## 2023-03-29 ENCOUNTER — Other Ambulatory Visit: Payer: Self-pay

## 2023-03-29 VITALS — BP 142/84 | HR 64 | Ht 62.5 in

## 2023-03-29 DIAGNOSIS — M79661 Pain in right lower leg: Secondary | ICD-10-CM | POA: Diagnosis not present

## 2023-03-29 NOTE — Patient Instructions (Addendum)
Thank you for coming in today.   My cell is [phone] text me first.   Rip Harbour to use hydrocodone for severe pain.   [place] for DVT is 1:30 tomorrow at [facility] cardiology.   Take the prednisone if you are getting possible.

## 2023-03-29 NOTE — Progress Notes (Unsigned)
Rubin Payor, PhD, LAT, ATC acting as a scribe for Clementeen Graham, MD.  Nancy [name] is a 57 y.o. female who presents to [facility] today for R lower leg pain. Pt was previously seen by Dr. Denyse Amass on 03/02/23 for chronic SI joint pain.   Today, pt c/o pain in her R lower leg ongoing since Friday, 8/23. No MOI. Pt locates pain to anterior-lateral aspect of her R lower leg.   Swelling: yes Aggravates: walking, TTP Treatments tried: tramadol, IBU  Dx imaging: 03/28/23 R tib/fib XR  12/01/22 R knee XR  11/12/20 R knee XR  Pertinent review of systems: No fevers or chills  Relevant historical information: Rheumatoid arthritis   Exam:  BP (!) 142/84   Pulse 64   Ht 5' 2.5" (1.588 m)   SpO2 94%   BMI 42.12 kg/m  General: Well Developed, well nourished, and in no acute distress.   MSK: Right leg slight swelling right calf.  Tender palpation right calf.  No palpable cords.  Normal foot and ankle motion. Strength is intact.    Lab and Radiology Results.  Diagnostic Limited MSK Ultrasound of: Right calf No definitive hematoma or muscle tear is visible on anterior lateral and posterior medial and lateral calf.  There is a faint band of hypoechoic fluid tracking just superficial to the fascial layer at the anterior lateral to posterior calf. No definitive Baker's cyst is visible at the posterior knee. Impression: Question ruptured Baker's cyst.   No results found for this or any previous visit (from the past 72 hour(s)). DG Tibia/Fibula Right  Result Date: 03/28/2023 CLINICAL DATA:  Upper right shin pain for 4 days EXAM: RIGHT TIBIA AND FIBULA - 2 VIEW COMPARISON:  12/01/2022 FINDINGS: Frontal and lateral views of the right tibia and fibula are obtained. No acute displaced fracture. No destructive bony abnormalities. Stable 3 compartmental right knee osteoarthritis. Small right knee effusion likely reactive. Right ankle is unremarkable. Soft  tissues are normal. IMPRESSION: 1. No acute or destructive bony abnormality. 2. Three compartmental right knee osteoarthritis, with small likely reactive joint effusion. Electronically Signed   By: Sharlet Salina M.D.   On: 03/28/2023 15:43   I, Clementeen Graham, personally (independently) visualized and performed the interpretation of the images attached in this note.     Assessment and Plan: 57 y.o. female with right calf pain.  Etiology is unclear.  She does have some swelling and pain and I am little worried about a DVT.  Will go ahead and check a vascular ultrasound.  This is scheduled for Thursday the 29th at 1:30 PM.  Assuming this is negative we will treat conservatively with compression sleeves and waiting. We talked about pain management.  She does have tramadol and a few leftover hydrocodone that she is happy to take at bedtime.  Happy to prescribe prednisone if needed.  She will keep me updated.  This could be lumbar radiculopathy but this is less likely.  If needed consider epidural steroid injection.  She has an established relationship with Dr. Alvester Morin.   PDMP not reviewed this encounter. Orders Placed This Encounter  Procedures   [place] LIMITED JOINT SPACE STRUCTURES LOW RIGHT(NO LINKED CHARGES)    Order Specific Question:   Reason for Exam (SYMPTOM  OR DIAGNOSIS REQUIRED)    Answer:   rigth lower leg pain    Order Specific Question:   Preferred imaging location?    Answer:   Adult [facility]  No orders of the defined types were placed in this encounter.    Discussed warning signs or symptoms. Please see discharge instructions. Patient expresses understanding.   The above documentation has been reviewed and is accurate and complete Clementeen Graham, M.D.

## 2023-03-30 ENCOUNTER — Ambulatory Visit: Payer: Commercial Managed Care - PPO | Admitting: Family Medicine

## 2023-03-30 ENCOUNTER — Ambulatory Visit (HOSPITAL_COMMUNITY)
Admission: RE | Admit: 2023-03-30 | Discharge: 2023-03-30 | Disposition: A | Discharge status: Home / Self Care | Payer: Commercial Managed Care - PPO | Source: Ambulatory Visit | Attending: Cardiology | Admitting: Cardiology

## 2023-03-30 DIAGNOSIS — M79661 Pain in right lower leg: Secondary | ICD-10-CM | POA: Diagnosis not present

## 2023-03-30 NOTE — Progress Notes (Signed)
Preliminary read shows no DVT.

## 2023-04-04 ENCOUNTER — Other Ambulatory Visit: Payer: Self-pay | Admitting: Family Medicine

## 2023-04-04 ENCOUNTER — Other Ambulatory Visit: Payer: Self-pay

## 2023-04-04 ENCOUNTER — Other Ambulatory Visit (HOSPITAL_BASED_OUTPATIENT_CLINIC_OR_DEPARTMENT_OTHER): Payer: Self-pay

## 2023-04-04 MED ORDER — PREDNISONE 50 MG PO TABS
50.0000 mg | ORAL_TABLET | Freq: Every day | ORAL | 0 refills | Status: AC
  Filled 2023-04-04: qty 5, 5d supply, fill #0

## 2023-04-04 NOTE — Progress Notes (Signed)
Final report shows no DVT.

## 2023-04-04 NOTE — Telephone Encounter (Signed)
Review LE doppler results with pt. Wanted to know if OK to take Prednisone.   Per Dr. Denyse Amass, OK to take Prednisone 50 mg daily for 5 days.   Confirmed pharmacy with pt, rx sent.

## 2023-04-05 ENCOUNTER — Other Ambulatory Visit (HOSPITAL_COMMUNITY): Payer: Self-pay

## 2023-04-06 ENCOUNTER — Ambulatory Visit
Admission: RE | Admit: 2023-04-06 | Discharge: 2023-04-06 | Disposition: A | Discharge status: Home / Self Care | Payer: Commercial Managed Care - PPO | Source: Ambulatory Visit | Attending: Family Medicine | Admitting: Family Medicine

## 2023-04-06 ENCOUNTER — Other Ambulatory Visit (HOSPITAL_COMMUNITY): Payer: Self-pay

## 2023-04-06 DIAGNOSIS — M0579 Rheumatoid arthritis with rheumatoid factor of multiple sites without organ or systems involvement: Secondary | ICD-10-CM | POA: Diagnosis not present

## 2023-04-06 DIAGNOSIS — Z1231 Encounter for screening mammogram for malignant neoplasm of breast: Secondary | ICD-10-CM

## 2023-04-07 ENCOUNTER — Other Ambulatory Visit: Payer: Self-pay

## 2023-04-07 ENCOUNTER — Other Ambulatory Visit (HOSPITAL_COMMUNITY): Payer: Self-pay

## 2023-04-07 MED ORDER — GABAPENTIN 300 MG PO CAPS
300.0000 mg | ORAL_CAPSULE | Freq: Three times a day (TID) | ORAL | 3 refills | Status: AC
  Filled 2023-04-07: qty 360, 90d supply, fill #0
  Filled 2023-08-04: qty 360, 90d supply, fill #1
  Filled 2023-12-01: qty 360, 90d supply, fill #2
  Filled 2023-12-05: qty 360, 90d supply, fill #0
  Filled 2024-03-26: qty 360, 90d supply, fill #1

## 2023-04-10 ENCOUNTER — Other Ambulatory Visit: Payer: Self-pay

## 2023-04-10 ENCOUNTER — Ambulatory Visit (INDEPENDENT_AMBULATORY_CARE_PROVIDER_SITE_OTHER): Payer: Commercial Managed Care - PPO | Admitting: Physical Medicine and Rehabilitation

## 2023-04-10 VITALS — BP 138/84 | HR 66

## 2023-04-10 DIAGNOSIS — M47816 Spondylosis without myelopathy or radiculopathy, lumbar region: Secondary | ICD-10-CM

## 2023-04-10 MED ORDER — BUPIVACAINE HCL 0.5 % IJ SOLN
3.0000 mL | Freq: Once | INTRAMUSCULAR | Status: AC
  Administered 2023-04-10: 3 mL

## 2023-04-10 NOTE — Progress Notes (Unsigned)
Functional Pain Scale - descriptive words and definitions  Distracting (5)    Aware of pain/able to complete some ADL's but limited by pain/sleep is affected and active distractions are only slightly useful. Moderate range order  Average Pain 5-6   +Driver, -BT, -Dye Allergies.  Lower back pain on both sides with some radiation in the hips

## 2023-04-10 NOTE — Patient Instructions (Signed)

## 2023-04-11 ENCOUNTER — Other Ambulatory Visit: Payer: Self-pay | Admitting: Physical Medicine and Rehabilitation

## 2023-04-11 DIAGNOSIS — G8929 Other chronic pain: Secondary | ICD-10-CM

## 2023-04-11 DIAGNOSIS — M47816 Spondylosis without myelopathy or radiculopathy, lumbar region: Secondary | ICD-10-CM

## 2023-04-11 NOTE — Progress Notes (Signed)
Per pain diary first set of bilateral L4-L5 and L5-S1 medial branch blocks provided 100% relief of pain. I will place order for second set of diagnostic medial branch blocks, plan is to proceed with radiofrequency ablation.

## 2023-04-12 NOTE — Procedures (Signed)
Lumbar Diagnostic Facet Joint Nerve Block with Fluoroscopic Guidance   Patient: Nancy [name]      Date of Birth: 05/06/1966 MRN: [id number] PCP: Shelva Majestic, MD      Visit Date: 04/10/2023   Universal Protocol:    Date/Time: 09/11/248:03 PM  Consent Given By: the patient  Position: PRONE  Additional Comments: Vital signs were monitored before and after the procedure. Patient was prepped and draped in the usual sterile fashion. The correct patient, procedure, and site was verified.   Injection Procedure Details:   Procedure diagnoses:  1. Spondylosis without myelopathy or radiculopathy, lumbar region      Meds Administered:  Meds ordered this encounter  Medications   bupivacaine (MARCAINE) 0.5 % (with pres) injection 3 mL     Laterality: Bilateral  Location/Site: L4-L5, L3 and L4 medial branches and L5-S1, L4 medial branch and L5 dorsal ramus  Needle: 5.0 in., 25 ga.  Short bevel or Quincke spinal needle  Needle Placement: Oblique pedical  Findings:   -Comments: There was excellent flow of contrast along the articular pillars without intravascular flow.  Procedure Details: The fluoroscope beam is vertically oriented in AP and then obliqued 15 to 20 degrees to the ipsilateral side of the desired nerve to achieve the "Scotty dog" appearance.  The skin over the target area of the junction of the superior articulating process and the transverse process (sacral ala if blocking the L5 dorsal rami) was locally anesthetized with a 1 ml volume of 1% Lidocaine without Epinephrine.  The spinal needle was inserted and advanced in a trajectory view down to the target.   After contact with periosteum and negative aspirate for blood and CSF, correct placement without intravascular or epidural spread was confirmed by injecting 0.5 ml. of Isovue-250.  A spot radiograph was obtained of this image.    Next, a 0.5 ml. volume of the injectate described above was injected. The  needle was then redirected to the other facet joint nerves mentioned above if needed.  Prior to the procedure, the patient was given a Pain Diary which was completed for baseline measurements.  After the procedure, the patient rated their pain every 30 minutes and will continue rating at this frequency for a total of 5 hours.  The patient has been asked to complete the Diary and return to [place] by mail, fax or hand delivered as soon as possible.   Additional Comments:  No complications occurred Dressing: 2 x 2 sterile gauze and Band-Aid    Post-procedure details: Patient was observed during the procedure. Post-procedure instructions were reviewed.  Patient left the clinic in stable condition.

## 2023-04-12 NOTE — Progress Notes (Signed)
Nancy [name] - 57 y.o. female MRN [id number]  Date of birth: [birth year]  Office Visit Note: Visit Date: 04/10/2023 PCP: Shelva Majestic, MD Referred by: Shelva Majestic, MD  Subjective: Chief Complaint  Patient presents with   Lower Back - Pain   HPI:  Nancy [name] is a 57 y.o. female who comes in today at the request of Ellin Goodie, FNP for planned Bilateral  L4-5 and L5-S1 Lumbar facet/medial branch block with fluoroscopic guidance.  The patient has failed conservative care including home exercise, medications, time and activity modification.  This injection will be diagnostic and hopefully therapeutic.  Please see requesting physician notes for further details and justification.  Exam has shown concordant pain with facet joint loading.   ROS Otherwise per HPI.  Assessment & Plan: Visit Diagnoses:    ICD-10-CM   1. Spondylosis without myelopathy or radiculopathy, lumbar region  M47.816 XR C-ARM NO REPORT    Nerve Block    bupivacaine (MARCAINE) 0.5 % (with pres) injection 3 mL      Plan: No additional findings.   Meds & Orders:  Meds ordered this encounter  Medications   bupivacaine (MARCAINE) 0.5 % (with pres) injection 3 mL    Orders Placed This Encounter  Procedures   Nerve Block   XR C-ARM NO REPORT    Follow-up: Return for Review Pain Diary.   Procedures: No procedures performed  Lumbar Diagnostic Facet Joint Nerve Block with Fluoroscopic Guidance   Patient: Nancy [name]      Date of Birth: [birth year] MRN: [id number] PCP: Shelva Majestic, MD      Visit Date: 04/10/2023   Universal Protocol:    Date/Time: 09/11/248:03 PM  Consent Given By: the patient  Position: PRONE  Additional Comments: Vital signs were monitored before and after the procedure. Patient was prepped and draped in the usual sterile fashion. The correct patient, procedure, and site was verified.   Injection Procedure Details:   Procedure diagnoses:   1. Spondylosis without myelopathy or radiculopathy, lumbar region      Meds Administered:  Meds ordered this encounter  Medications   bupivacaine (MARCAINE) 0.5 % (with pres) injection 3 mL     Laterality: Bilateral  Location/Site: L4-L5, L3 and L4 medial branches and L5-S1, L4 medial branch and L5 dorsal ramus  Needle: 5.0 in., 25 ga.  Short bevel or Quincke spinal needle  Needle Placement: Oblique pedical  Findings:   -Comments: There was excellent flow of contrast along the articular pillars without intravascular flow.  Procedure Details: The fluoroscope beam is vertically oriented in AP and then obliqued 15 to 20 degrees to the ipsilateral side of the desired nerve to achieve the "Scotty dog" appearance.  The skin over the target area of the junction of the superior articulating process and the transverse process (sacral ala if blocking the L5 dorsal rami) was locally anesthetized with a 1 ml volume of 1% Lidocaine without Epinephrine.  The spinal needle was inserted and advanced in a trajectory view down to the target.   After contact with periosteum and negative aspirate for blood and CSF, correct placement without intravascular or epidural spread was confirmed by injecting 0.5 ml. of Isovue-250.  A spot radiograph was obtained of this image.    Next, a 0.5 ml. volume of the injectate described above was injected. The needle was then redirected to the other facet joint nerves mentioned above if needed.  Prior to the procedure, the patient was given  a Pain Diary which was completed for baseline measurements.  After the procedure, the patient rated their pain every 30 minutes and will continue rating at this frequency for a total of 5 hours.  The patient has been asked to complete the Diary and return to [place] by mail, fax or hand delivered as soon as possible.   Additional Comments:  No complications occurred Dressing: 2 x 2 sterile gauze and Band-Aid    Post-procedure  details: Patient was observed during the procedure. Post-procedure instructions were reviewed.  Patient left the clinic in stable condition.   Clinical History: CLINICAL DATA:  Lumbar radiculitis.   EXAM: MRI LUMBAR SPINE WITHOUT CONTRAST   TECHNIQUE: Multiplanar, multisequence MR imaging of the lumbar spine was performed. No intravenous contrast was administered.   COMPARISON:  MRI of the lumbar spine January 10, 2021.   FINDINGS: Segmentation:  Standard.   Alignment: Trace anterolisthesis of L4 over L5. Trace retrolisthesis at L2-3 and L3-4.   Vertebrae: No fracture, evidence of discitis, or aggressive bone lesion. Hemangiomas are seen at T11, L1, L4 and L5.   Conus medullaris and cauda equina: Conus extends to the L1 level. Small Filar lipoma, unchanged. Conus appear normal.   Paraspinal and other soft tissues: Negative.   Disc levels:   T12-L1: No spinal canal or neural foraminal stenosis.   L1-2: No spinal canal or neural foraminal stenosis.   L2-3: Shallow disc bulge with tiny right foraminal disc protrusion, mild facet degenerative changes with mild bilateral joint effusion. No significant spinal canal or neural foraminal stenosis. No significant change from prior.   L3-4: Disc bulge with superimposed small left central to subarticular disc protrusion causing indentation on the thecal sac and resulting in mild-to-moderate left subarticular zone stenosis, mildly progressed compared to prior. Mild facet degenerative changes with mild bilateral joint effusion. No significant neural foraminal narrowing.   L4-5: Disc bulge, advanced hypertrophic facet degenerative changes with bilateral joint effusion and ligamentum flavum redundancy resulting in mild spinal canal stenosis with mild narrowing of the right subarticular zone and mild right neural foraminal narrowing. Compared to prior MRI, there is progression of joint effusion with significant improvement of  associated periarticular and marrow edema.   L5-S1: Right asymmetric disc bulge and moderate facet degenerative changes resulting in mild right neural foraminal narrowing. No spinal canal stenosis.   IMPRESSION: 1. Mild progression of degenerative changes at L3-4 with mild-to-moderate left subarticular zone stenosis. 2. Mild spinal canal stenosis with mild narrowing of the right subarticular zone and mild right neural foraminal narrowing at L4-5. 3. Mild right neural foraminal narrowing at L5-S1.     Electronically Signed   By: Baldemar Lenis M.D.   On: 06/08/2022 08:15     Objective:  VS:  HT:    WT:   BMI:     BP:138/84  HR:66bpm  TEMP: ( )  RESP:  Physical Exam Vitals and nursing note reviewed.  Constitutional:      General: She is not in acute distress.    Appearance: Normal appearance. She is obese. She is not ill-appearing.  HENT:     Head: Normocephalic and atraumatic.     Right Ear: External ear normal.     Left Ear: External ear normal.  Eyes:     Extraocular Movements: Extraocular movements intact.  Cardiovascular:     Rate and Rhythm: Normal rate.     Pulses: Normal pulses.  Pulmonary:     Effort: Pulmonary effort is normal. No  respiratory distress.  Abdominal:     General: There is no distension.     Palpations: Abdomen is soft.  Musculoskeletal:        General: Tenderness present.     Cervical back: Neck supple.     Right lower leg: No edema.     Left lower leg: No edema.     Comments: Patient has good distal strength with no pain over the greater trochanters.  No clonus or focal weakness.  Skin:    Findings: No erythema, lesion or rash.  Neurological:     General: No focal deficit present.     Mental Status: She is alert and oriented to person, place, and time.     Sensory: No sensory deficit.     Motor: No weakness or abnormal muscle tone.     Coordination: Coordination normal.  Psychiatric:        Mood and Affect: Mood  normal.        Behavior: Behavior normal.      Imaging: No results found.

## 2023-04-25 ENCOUNTER — Ambulatory Visit: Payer: Commercial Managed Care - PPO | Admitting: Physical Medicine and Rehabilitation

## 2023-04-25 ENCOUNTER — Other Ambulatory Visit: Payer: Self-pay

## 2023-04-25 VITALS — BP 136/69 | HR 68

## 2023-04-25 DIAGNOSIS — M47816 Spondylosis without myelopathy or radiculopathy, lumbar region: Secondary | ICD-10-CM | POA: Diagnosis not present

## 2023-04-25 MED ORDER — BUPIVACAINE HCL 0.5 % IJ SOLN
3.0000 mL | Freq: Once | INTRAMUSCULAR | Status: AC
  Administered 2023-04-25: 3 mL

## 2023-04-25 NOTE — Patient Instructions (Signed)

## 2023-04-25 NOTE — Progress Notes (Unsigned)
Functional Pain Scale - descriptive words and definitions  Distracting (5)    Aware of pain/able to complete some ADL's but limited by pain/sleep is affected and active distractions are only slightly useful. Moderate range order  Average Pain 5   +Driver, -BT, -Dye Allergies.  Lower back pain on both sides with radiation in the right hip and leg

## 2023-04-26 ENCOUNTER — Other Ambulatory Visit: Payer: Self-pay | Admitting: Physical Medicine and Rehabilitation

## 2023-04-26 ENCOUNTER — Other Ambulatory Visit (HOSPITAL_BASED_OUTPATIENT_CLINIC_OR_DEPARTMENT_OTHER): Payer: Self-pay

## 2023-04-26 ENCOUNTER — Telehealth: Payer: Self-pay

## 2023-04-26 DIAGNOSIS — M47816 Spondylosis without myelopathy or radiculopathy, lumbar region: Secondary | ICD-10-CM

## 2023-04-26 DIAGNOSIS — G8929 Other chronic pain: Secondary | ICD-10-CM

## 2023-04-26 MED ORDER — DIAZEPAM 5 MG PO TABS
ORAL_TABLET | ORAL | 0 refills | Status: DC
Start: 2023-04-26 — End: 2023-06-01
  Filled 2023-04-26: qty 2, fill #0

## 2023-04-26 NOTE — Progress Notes (Signed)
Patient reports 100% relief of pain with second set of bilateral L4-L5 and L5-S1 facet joint injections. I will place order for RFA and pre-procedure Valium.

## 2023-04-26 NOTE — Procedures (Signed)
Lumbar Diagnostic Facet Joint Nerve Block with Fluoroscopic Guidance   Patient: Nancy [name]      Date of Birth: [birth year] MRN: [id number] PCP: Shelva Majestic, MD      Visit Date: 04/25/2023   Universal Protocol:    Date/Time: 04/25/2410:02 AM  Consent Given By: the patient  Position: PRONE  Additional Comments: Vital signs were monitored before and after the procedure. Patient was prepped and draped in the usual sterile fashion. The correct patient, procedure, and site was verified.   Injection Procedure Details:   Procedure diagnoses:  1. Spondylosis without myelopathy or radiculopathy, lumbar region      Meds Administered:  Meds ordered this encounter  Medications   bupivacaine (MARCAINE) 0.5 % (with pres) injection 3 mL     Laterality: Bilateral  Location/Site: L4-L5, L3 and L4 medial branches and L5-S1, L4 medial branch and L5 dorsal ramus  Needle: 5.0 in., 25 ga.  Short bevel or Quincke spinal needle  Needle Placement: Oblique pedical  Findings:   -Comments: There was excellent flow of contrast along the articular pillars without intravascular flow.  Procedure Details: The fluoroscope beam is vertically oriented in AP and then obliqued 15 to 20 degrees to the ipsilateral side of the desired nerve to achieve the "Scotty dog" appearance.  The skin over the target area of the junction of the superior articulating process and the transverse process (sacral ala if blocking the L5 dorsal rami) was locally anesthetized with a 1 ml volume of 1% Lidocaine without Epinephrine.  The spinal needle was inserted and advanced in a trajectory view down to the target.   After contact with periosteum and negative aspirate for blood and CSF, correct placement without intravascular or epidural spread was confirmed by injecting 0.5 ml. of Isovue-250.  A spot radiograph was obtained of this image.    Next, a 0.5 ml. volume of the injectate described above was injected. The  needle was then redirected to the other facet joint nerves mentioned above if needed.  Prior to the procedure, the patient was given a Pain Diary which was completed for baseline measurements.  After the procedure, the patient rated their pain every 30 minutes and will continue rating at this frequency for a total of 5 hours.  The patient has been asked to complete the Diary and return to [place] by mail, fax or hand delivered as soon as possible.   Additional Comments:  No complications occurred Dressing: 2 x 2 sterile gauze and Band-Aid    Post-procedure details: Patient was observed during the procedure. Post-procedure instructions were reviewed.  Patient left the clinic in stable condition.

## 2023-04-26 NOTE — Telephone Encounter (Signed)
Patient wanted to let us know the procedure worked. She was pain free from around 4:30 till around 9:00 PM. Please advise

## 2023-04-26 NOTE — Progress Notes (Signed)
Nancy [name] - 57 y.o. female MRN [id number]  Date of birth: 1965/08/21  Office Visit Note: Visit Date: 04/25/2023 PCP: Shelva Majestic, MD Referred by: Shelva Majestic, MD  Subjective: Chief Complaint  Patient presents with   Lower Back - Pain   HPI:  Nancy [name] is a 57 y.o. female who comes in today for planned repeat Bilateral L4-5 and L5-S1 Lumbar facet/medial branch block with fluoroscopic guidance.  The patient has failed conservative care including home exercise, medications, time and activity modification.  This injection will be diagnostic and hopefully therapeutic.  Please see requesting physician notes for further details and justification.  Exam shows concordant low back pain with facet joint loading and extension. Patient received more than 80% pain relief from prior injection. This would be the second block in a diagnostic double block paradigm.     Referring:Megan Mayford Knife, FNP    ROS Otherwise per HPI.  Assessment & Plan: Visit Diagnoses:    ICD-10-CM   1. Spondylosis without myelopathy or radiculopathy, lumbar region  M47.816 XR C-ARM NO REPORT    Nerve Block    bupivacaine (MARCAINE) 0.5 % (with pres) injection 3 mL      Plan: No additional findings.   Meds & Orders:  Meds ordered this encounter  Medications   bupivacaine (MARCAINE) 0.5 % (with pres) injection 3 mL    Orders Placed This Encounter  Procedures   Nerve Block   XR C-ARM NO REPORT    Follow-up: Return for Review Pain Diary.   Procedures: No procedures performed  Lumbar Diagnostic Facet Joint Nerve Block with Fluoroscopic Guidance   Patient: Nancy [name]      Date of Birth: 11-Sep-1965 MRN: [id number] PCP: Shelva Majestic, MD      Visit Date: 04/25/2023   Universal Protocol:    Date/Time: 04/25/2410:02 AM  Consent Given By: the patient  Position: PRONE  Additional Comments: Vital signs were monitored before and after the procedure. Patient was  prepped and draped in the usual sterile fashion. The correct patient, procedure, and site was verified.   Injection Procedure Details:   Procedure diagnoses:  1. Spondylosis without myelopathy or radiculopathy, lumbar region      Meds Administered:  Meds ordered this encounter  Medications   bupivacaine (MARCAINE) 0.5 % (with pres) injection 3 mL     Laterality: Bilateral  Location/Site: L4-L5, L3 and L4 medial branches and L5-S1, L4 medial branch and L5 dorsal ramus  Needle: 5.0 in., 25 ga.  Short bevel or Quincke spinal needle  Needle Placement: Oblique pedical  Findings:   -Comments: There was excellent flow of contrast along the articular pillars without intravascular flow.  Procedure Details: The fluoroscope beam is vertically oriented in AP and then obliqued 15 to 20 degrees to the ipsilateral side of the desired nerve to achieve the "Scotty dog" appearance.  The skin over the target area of the junction of the superior articulating process and the transverse process (sacral ala if blocking the L5 dorsal rami) was locally anesthetized with a 1 ml volume of 1% Lidocaine without Epinephrine.  The spinal needle was inserted and advanced in a trajectory view down to the target.   After contact with periosteum and negative aspirate for blood and CSF, correct placement without intravascular or epidural spread was confirmed by injecting 0.5 ml. of Isovue-250.  A spot radiograph was obtained of this image.    Next, a 0.5 ml. volume of the injectate described above  was injected. The needle was then redirected to the other facet joint nerves mentioned above if needed.  Prior to the procedure, the patient was given a Pain Diary which was completed for baseline measurements.  After the procedure, the patient rated their pain every 30 minutes and will continue rating at this frequency for a total of 5 hours.  The patient has been asked to complete the Diary and return to [place] by mail, fax  or hand delivered as soon as possible.   Additional Comments:  No complications occurred Dressing: 2 x 2 sterile gauze and Band-Aid    Post-procedure details: Patient was observed during the procedure. Post-procedure instructions were reviewed.  Patient left the clinic in stable condition.   Clinical History: CLINICAL DATA:  Lumbar radiculitis.   EXAM: MRI LUMBAR SPINE WITHOUT CONTRAST   TECHNIQUE: Multiplanar, multisequence MR imaging of the lumbar spine was performed. No intravenous contrast was administered.   COMPARISON:  MRI of the lumbar spine January 10, 2021.   FINDINGS: Segmentation:  Standard.   Alignment: Trace anterolisthesis of L4 over L5. Trace retrolisthesis at L2-3 and L3-4.   Vertebrae: No fracture, evidence of discitis, or aggressive bone lesion. Hemangiomas are seen at T11, L1, L4 and L5.   Conus medullaris and cauda equina: Conus extends to the L1 level. Small Filar lipoma, unchanged. Conus appear normal.   Paraspinal and other soft tissues: Negative.   Disc levels:   T12-L1: No spinal canal or neural foraminal stenosis.   L1-2: No spinal canal or neural foraminal stenosis.   L2-3: Shallow disc bulge with tiny right foraminal disc protrusion, mild facet degenerative changes with mild bilateral joint effusion. No significant spinal canal or neural foraminal stenosis. No significant change from prior.   L3-4: Disc bulge with superimposed small left central to subarticular disc protrusion causing indentation on the thecal sac and resulting in mild-to-moderate left subarticular zone stenosis, mildly progressed compared to prior. Mild facet degenerative changes with mild bilateral joint effusion. No significant neural foraminal narrowing.   L4-5: Disc bulge, advanced hypertrophic facet degenerative changes with bilateral joint effusion and ligamentum flavum redundancy resulting in mild spinal canal stenosis with mild narrowing of the right  subarticular zone and mild right neural foraminal narrowing. Compared to prior MRI, there is progression of joint effusion with significant improvement of associated periarticular and marrow edema.   L5-S1: Right asymmetric disc bulge and moderate facet degenerative changes resulting in mild right neural foraminal narrowing. No spinal canal stenosis.   IMPRESSION: 1. Mild progression of degenerative changes at L3-4 with mild-to-moderate left subarticular zone stenosis. 2. Mild spinal canal stenosis with mild narrowing of the right subarticular zone and mild right neural foraminal narrowing at L4-5. 3. Mild right neural foraminal narrowing at L5-S1.     Electronically Signed   By: Baldemar Lenis M.D.   On: 06/08/2022 08:15     Objective:  VS:  HT:    WT:   BMI:     BP:136/69  HR:68bpm  TEMP: ( )  RESP:  Physical Exam Vitals and nursing note reviewed.  Constitutional:      General: She is not in acute distress.    Appearance: Normal appearance. She is not ill-appearing.  HENT:     Head: Normocephalic and atraumatic.     Right Ear: External ear normal.     Left Ear: External ear normal.  Eyes:     Extraocular Movements: Extraocular movements intact.  Cardiovascular:     Rate  and Rhythm: Normal rate.     Pulses: Normal pulses.  Pulmonary:     Effort: Pulmonary effort is normal. No respiratory distress.  Abdominal:     General: There is no distension.     Palpations: Abdomen is soft.  Musculoskeletal:        General: Tenderness present.     Cervical back: Neck supple.     Right lower leg: No edema.     Left lower leg: No edema.     Comments: Patient has good distal strength with no pain over the greater trochanters.  No clonus or focal weakness.  Skin:    Findings: No erythema, lesion or rash.  Neurological:     General: No focal deficit present.     Mental Status: She is alert and oriented to person, place, and time.     Sensory: No sensory  deficit.     Motor: No weakness or abnormal muscle tone.     Coordination: Coordination normal.  Psychiatric:        Mood and Affect: Mood normal.        Behavior: Behavior normal.      Imaging: XR C-ARM NO REPORT  Result Date: 04/25/2023 Please see Notes tab for imaging impression.

## 2023-04-27 ENCOUNTER — Other Ambulatory Visit (HOSPITAL_COMMUNITY): Payer: Self-pay

## 2023-05-02 ENCOUNTER — Other Ambulatory Visit: Payer: Self-pay

## 2023-05-08 ENCOUNTER — Ambulatory Visit: Payer: Commercial Managed Care - PPO | Admitting: Physical Medicine and Rehabilitation

## 2023-05-08 ENCOUNTER — Other Ambulatory Visit: Payer: Self-pay

## 2023-05-08 VITALS — BP 130/78 | HR 64

## 2023-05-08 DIAGNOSIS — M47816 Spondylosis without myelopathy or radiculopathy, lumbar region: Secondary | ICD-10-CM | POA: Diagnosis not present

## 2023-05-08 MED ORDER — METHYLPREDNISOLONE ACETATE 40 MG/ML IJ SUSP
40.0000 mg | Freq: Once | INTRAMUSCULAR | Status: AC
Start: 2023-05-08 — End: 2023-05-08
  Administered 2023-05-08: 40 mg

## 2023-05-08 NOTE — Progress Notes (Signed)
Nancy [name] - 57 y.o. female MRN [id number]  Date of birth: May 14, 1966  Office Visit Note: Visit Date: 05/08/2023 PCP: Shelva Majestic, MD Referred by: Shelva Majestic, MD  Subjective: Chief Complaint  Patient presents with   Lower Back - Pain   HPI:  Nancy [name] is a 57 y.o. female who comes in todayfor planned radiofrequency ablation of the Left L4-5 and L5-S1 Lumbar facet joints. This would be ablation of the corresponding medial branches and/or dorsal rami.  Patient has had double diagnostic blocks with more than 50% relief.  These are documented on pain diary.  They have had chronic back pain for quite some time, more than 3 months, which has been an ongoing situation with recalcitrant axial back pain.  They have no radicular pain.  Their axial pain is worse with standing and ambulating and on exam today with facet loading.  They have had physical therapy as well as home exercise program.  The imaging noted in the chart below indicated facet pathology. Accordingly they meet all the criteria and qualification for for radiofrequency ablation and we are going to complete this today hopefully for more longer term relief as part of comprehensive management program.   ROS Otherwise per HPI.  Assessment & Plan: Visit Diagnoses:    ICD-10-CM   1. Spondylosis without myelopathy or radiculopathy, lumbar region  M47.816 XR C-ARM NO REPORT    Radiofrequency,Lumbar    methylPREDNISolone acetate (DEPO-MEDROL) injection 40 mg      Plan: No additional findings.   Meds & Orders:  Meds ordered this encounter  Medications   methylPREDNISolone acetate (DEPO-MEDROL) injection 40 mg    Orders Placed This Encounter  Procedures   Radiofrequency,Lumbar   XR C-ARM NO REPORT    Follow-up: Return if symptoms worsen or fail to improve.   Procedures: No procedures performed  Lumbar Facet Joint Nerve Denervation  Patient: Nancy [name]      Date of Birth: [birth year] MRN:  [id number] PCP: Shelva Majestic, MD      Visit Date: 05/08/2023   Universal Protocol:    Date/Time: 10/15/246:35 PM  Consent Given By: the patient  Position: PRONE  Additional Comments: Vital signs were monitored before and after the procedure. Patient was prepped and draped in the usual sterile fashion. The correct patient, procedure, and site was verified.   Injection Procedure Details:   Procedure diagnoses:  1. Spondylosis without myelopathy or radiculopathy, lumbar region      Meds Administered:  Meds ordered this encounter  Medications   methylPREDNISolone acetate (DEPO-MEDROL) injection 40 mg     Laterality: Left  Location/Site:  L4-L5, L3 and L4 medial branches and L5-S1, L4 medial branch and L5 dorsal ramus  Needle: 18 ga.,  10mm active tip, RF Cannula  Needle Placement: Along juncture of superior articular process and transverse pocess  Findings:  -Comments:  Procedure Details: For each desired target nerve, the corresponding transverse process (sacral ala for the L5 dorsal rami) was identified and the fluoroscope was positioned to square off the endplates of the corresponding vertebral body to achieve a true AP midline view.  The beam was then obliqued 15 to 20 degrees and caudally tilted 15 to 20 degrees to line up a trajectory along the target nerves. The skin over the target of the junction of superior articulating process and transverse process (sacral ala for the L5 dorsal rami) was infiltrated with 1ml of 1% Lidocaine without Epinephrine.  The 18 gauge  10mm active tip outer cannula was advanced in trajectory view to the target.  This procedure was repeated for each target nerve.  Then, for all levels, the outer cannula placement was fine-tuned and the position was then confirmed with bi-planar imaging.    Test stimulation was done both at sensory and motor levels to ensure there was no radicular stimulation. The target tissues were then infiltrated  with 1 ml of 1% Lidocaine without Epinephrine. Subsequently, a percutaneous neurotomy was carried out for 90 seconds at 80 degrees Celsius.  After the completion of the lesion, 1 ml of injectate was delivered. It was then repeated for each facet joint nerve mentioned above. Appropriate radiographs were obtained to verify the probe placement during the neurotomy.   Additional Comments:  No complications occurred Dressing: 2 x 2 sterile gauze and Band-Aid    Post-procedure details: Patient was observed during the procedure. Post-procedure instructions were reviewed.  Patient left the clinic in stable condition.      Clinical History: CLINICAL DATA:  Lumbar radiculitis.   EXAM: MRI LUMBAR SPINE WITHOUT CONTRAST   TECHNIQUE: Multiplanar, multisequence MR imaging of the lumbar spine was performed. No intravenous contrast was administered.   COMPARISON:  MRI of the lumbar spine January 10, 2021.   FINDINGS: Segmentation:  Standard.   Alignment: Trace anterolisthesis of L4 over L5. Trace retrolisthesis at L2-3 and L3-4.   Vertebrae: No fracture, evidence of discitis, or aggressive bone lesion. Hemangiomas are seen at T11, L1, L4 and L5.   Conus medullaris and cauda equina: Conus extends to the L1 level. Small Filar lipoma, unchanged. Conus appear normal.   Paraspinal and other soft tissues: Negative.   Disc levels:   T12-L1: No spinal canal or neural foraminal stenosis.   L1-2: No spinal canal or neural foraminal stenosis.   L2-3: Shallow disc bulge with tiny right foraminal disc protrusion, mild facet degenerative changes with mild bilateral joint effusion. No significant spinal canal or neural foraminal stenosis. No significant change from prior.   L3-4: Disc bulge with superimposed small left central to subarticular disc protrusion causing indentation on the thecal sac and resulting in mild-to-moderate left subarticular zone stenosis, mildly progressed compared to  prior. Mild facet degenerative changes with mild bilateral joint effusion. No significant neural foraminal narrowing.   L4-5: Disc bulge, advanced hypertrophic facet degenerative changes with bilateral joint effusion and ligamentum flavum redundancy resulting in mild spinal canal stenosis with mild narrowing of the right subarticular zone and mild right neural foraminal narrowing. Compared to prior MRI, there is progression of joint effusion with significant improvement of associated periarticular and marrow edema.   L5-S1: Right asymmetric disc bulge and moderate facet degenerative changes resulting in mild right neural foraminal narrowing. No spinal canal stenosis.   IMPRESSION: 1. Mild progression of degenerative changes at L3-4 with mild-to-moderate left subarticular zone stenosis. 2. Mild spinal canal stenosis with mild narrowing of the right subarticular zone and mild right neural foraminal narrowing at L4-5. 3. Mild right neural foraminal narrowing at L5-S1.     Electronically Signed   By: Baldemar Lenis M.D.   On: 06/08/2022 08:15     Objective:  VS:  HT:    WT:   BMI:     BP:130/78  HR:64bpm  TEMP: ( )  RESP:  Physical Exam Vitals and nursing note reviewed.  Constitutional:      General: She is not in acute distress.    Appearance: Normal appearance. She is obese. She is  not ill-appearing.  HENT:     Head: Normocephalic and atraumatic.     Right Ear: External ear normal.     Left Ear: External ear normal.  Eyes:     Extraocular Movements: Extraocular movements intact.  Cardiovascular:     Rate and Rhythm: Normal rate.     Pulses: Normal pulses.  Pulmonary:     Effort: Pulmonary effort is normal. No respiratory distress.  Abdominal:     General: There is no distension.     Palpations: Abdomen is soft.  Musculoskeletal:        General: Tenderness present.     Cervical back: Neck supple.     Right lower leg: No edema.     Left lower  leg: No edema.     Comments: Patient has good distal strength with no pain over the greater trochanters.  No clonus or focal weakness.  Skin:    Findings: No erythema, lesion or rash.  Neurological:     General: No focal deficit present.     Mental Status: She is alert and oriented to person, place, and time.     Sensory: No sensory deficit.     Motor: No weakness or abnormal muscle tone.     Coordination: Coordination normal.  Psychiatric:        Mood and Affect: Mood normal.        Behavior: Behavior normal.      Imaging: No results found.

## 2023-05-08 NOTE — Progress Notes (Signed)
Functional Pain Scale - descriptive words and definitions  Distracting (5)    Aware of pain/able to complete some ADL's but limited by pain/sleep is affected and active distractions are only slightly useful. Moderate range order  Average Pain 5   +Driver, -BT, -Dye Allergies.  Lower back pain on both sides

## 2023-05-08 NOTE — Patient Instructions (Signed)

## 2023-05-11 ENCOUNTER — Other Ambulatory Visit (HOSPITAL_BASED_OUTPATIENT_CLINIC_OR_DEPARTMENT_OTHER): Payer: Self-pay

## 2023-05-11 ENCOUNTER — Other Ambulatory Visit: Payer: Self-pay | Admitting: Family Medicine

## 2023-05-12 ENCOUNTER — Other Ambulatory Visit (HOSPITAL_BASED_OUTPATIENT_CLINIC_OR_DEPARTMENT_OTHER): Payer: Self-pay

## 2023-05-12 MED ORDER — HYDROCHLOROTHIAZIDE 25 MG PO TABS
25.0000 mg | ORAL_TABLET | Freq: Every day | ORAL | 1 refills | Status: DC | PRN
  Filled 2023-05-12: qty 90, 90d supply, fill #0
  Filled 2023-09-13: qty 90, 90d supply, fill #1

## 2023-05-13 IMAGING — MR MR LUMBAR SPINE W/O CM
4 of 5 series · 18 of 48 positions shown · non-contrast
Comparison: Prior MRI from/[DATE].

CLINICAL DATA: Initial evaluation for lower back pain with
radiation into both buttocks and legs for 3 months.

EXAM:
MRI LUMBAR SPINE WITHOUT CONTRAST
TECHNIQUE: Multiplanar, multisequence MR imaging of the lumbar spine was
performed. No intravenous contrast was administered.

[Series 6: T2 · sagittal · 4.0mm · 0.73mm/px · 6 of 15 slices shown (1 of 2)]
[im 1/15]
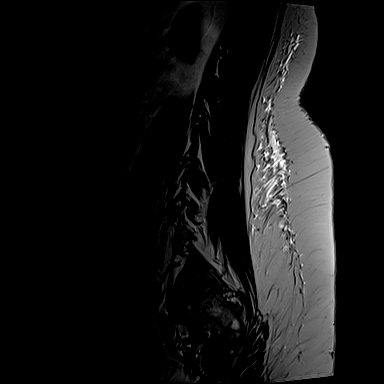
[im 3/15]
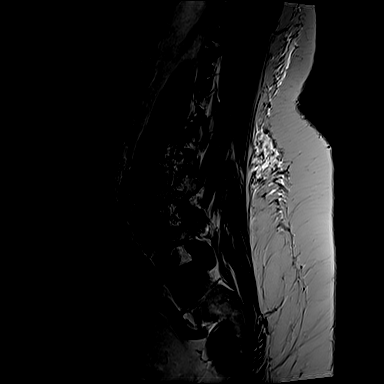
[im 6/15]
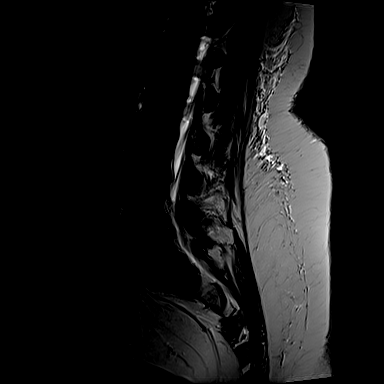
[im 9/15]
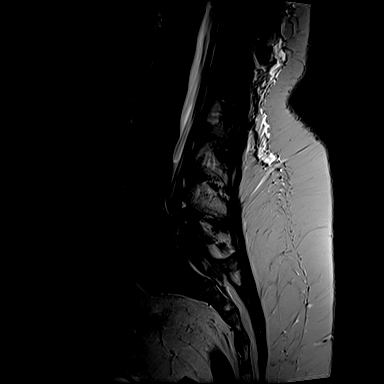
[im 12/15]
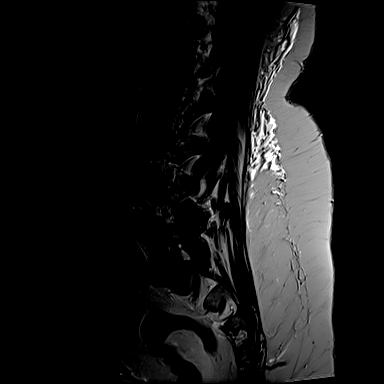
[im 15/15]
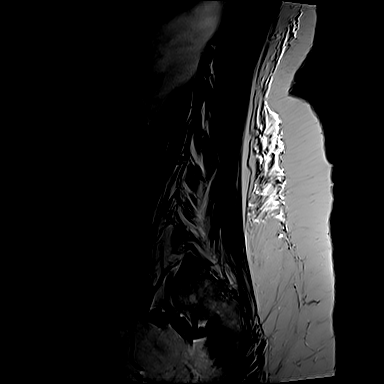

[Series 8: T1 · sagittal · 4.0mm · 0.73mm/px · 3 of 15 slices shown (1 of 2)]
[im 3/15]
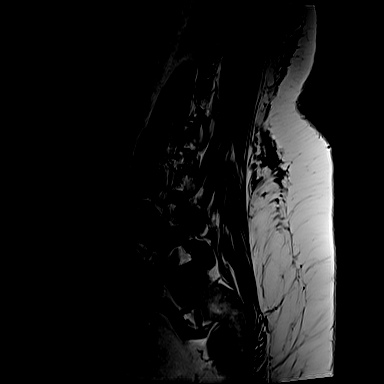
[im 9/15]
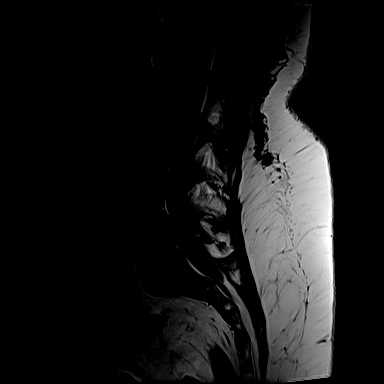
[im 15/15]
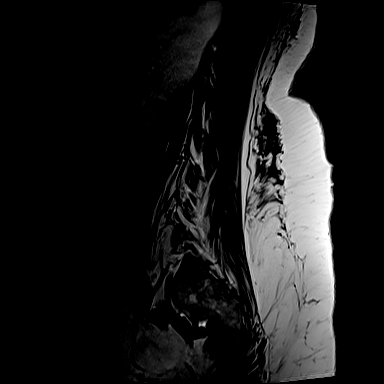

[Series 11: T1 · axial · 4.0mm · 0.28mm/px · z∈[-44,+111]mm · 3 of 37 slices shown (2 of 2)]
[im 6/37]
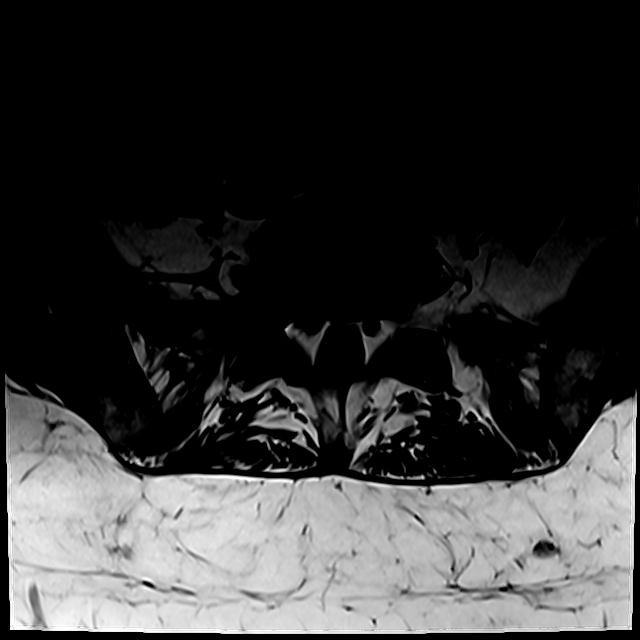
[im 19/37]
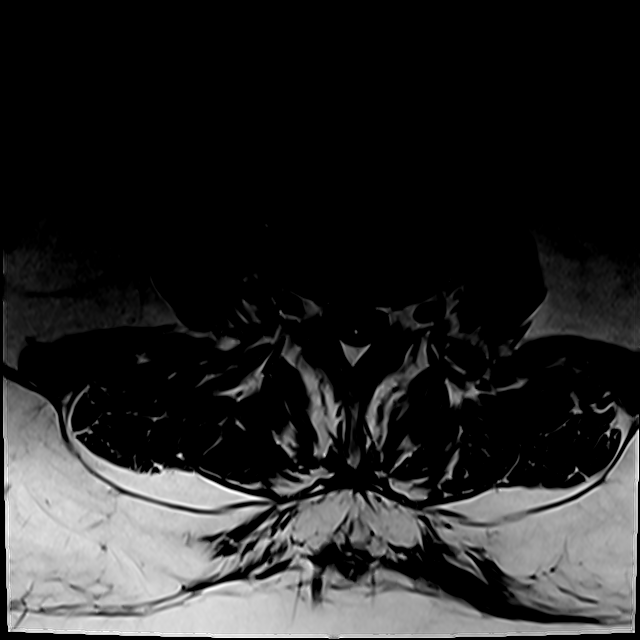
[im 31/37]
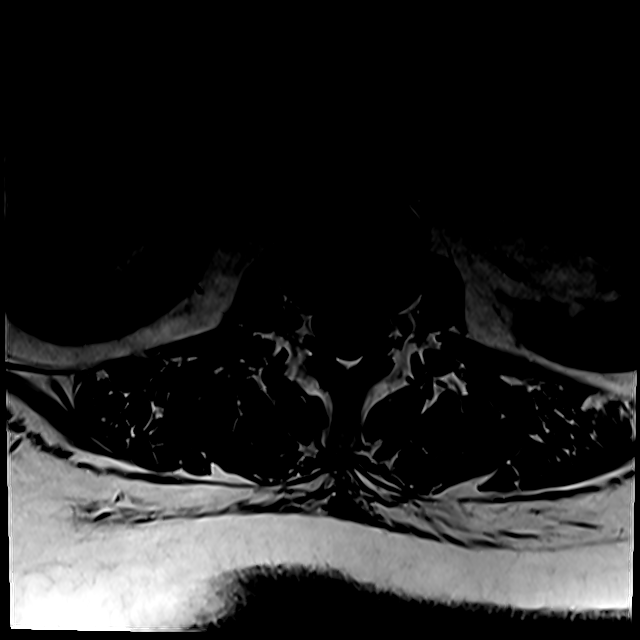

[Series 14: T2 · axial · 4.0mm · 0.28mm/px · z∈[-69,+111]mm · 6 of 37 slices shown (2 of 2)]
[im 1/37]
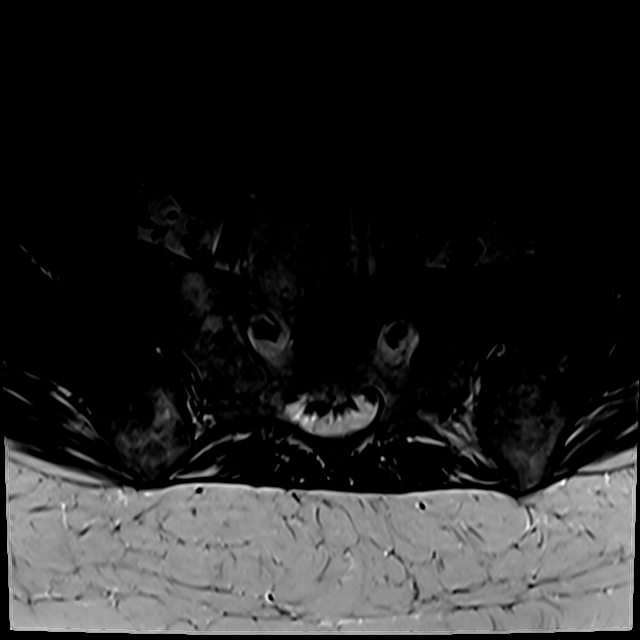
[im 6/37]
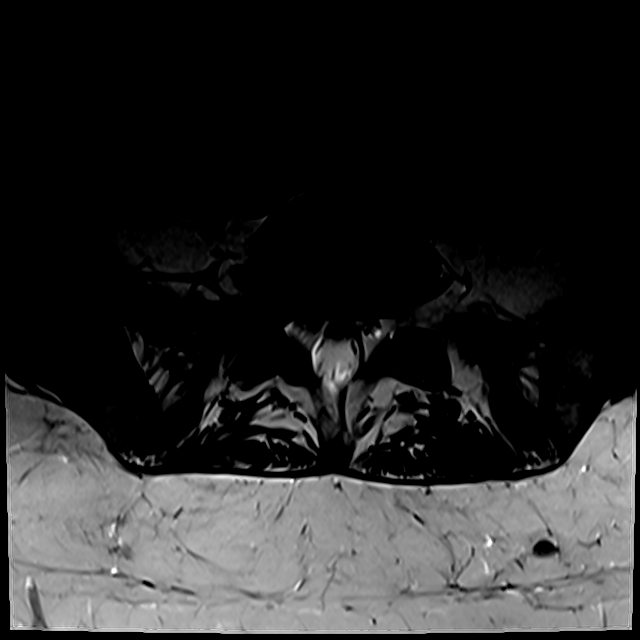
[im 11/37]
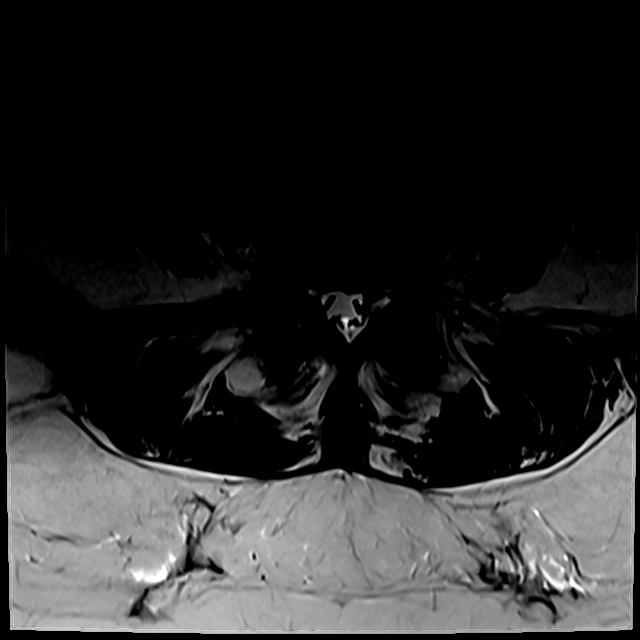
[im 16/37]
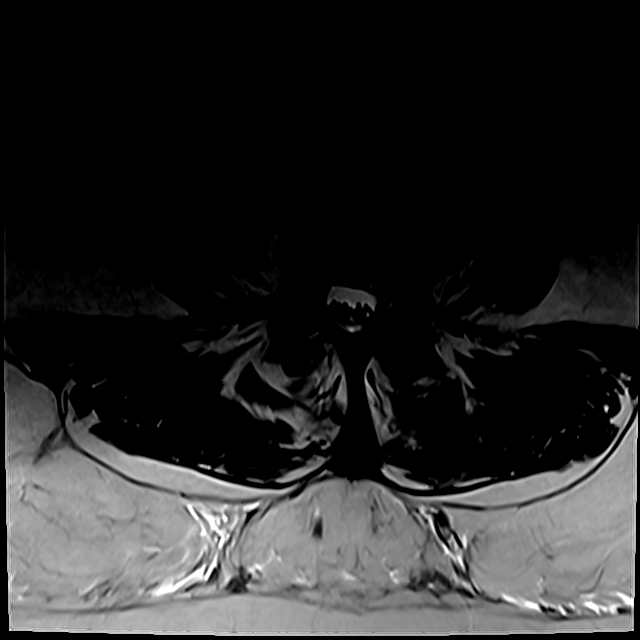
[im 19/37]
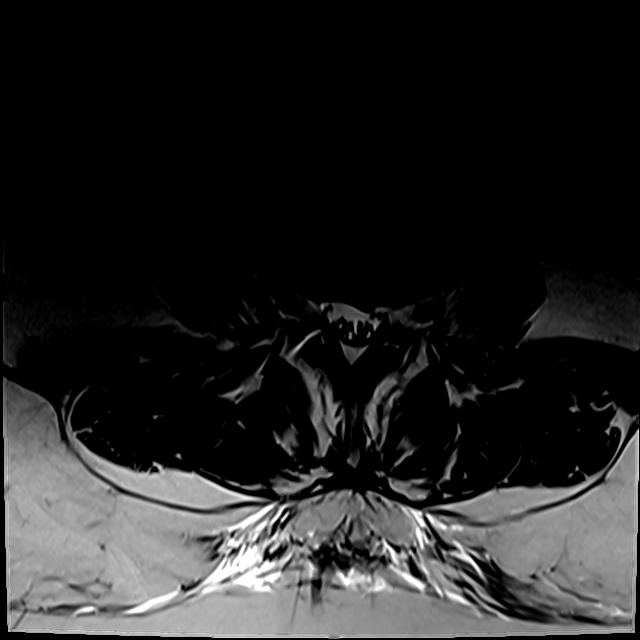
[im 31/37]
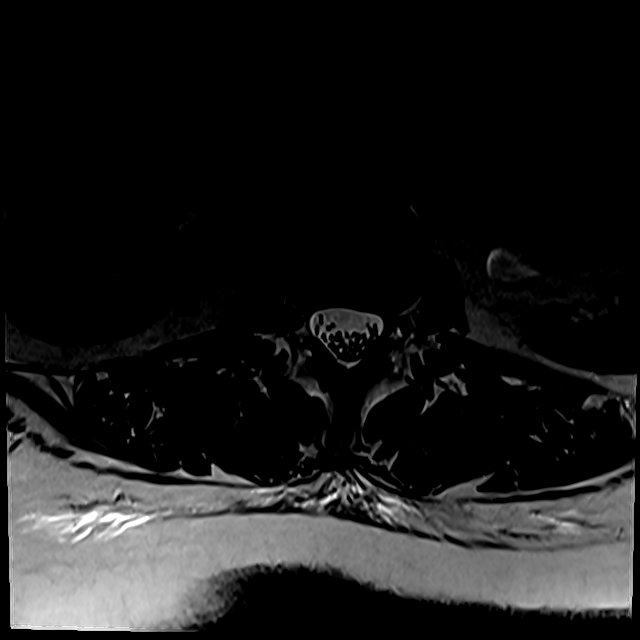

[18 of 48 positions shown; findings below may reference images not displayed]

FINDINGS: Segmentation: Standard. Lowest well-formed disc space labeled the
L5-S1 level.

Alignment: Trace retrolisthesis of L2 on L3 and L3 on L4, with trace
anterolisthesis of L4 on L5, stable from previous. Findings chronic
and facet mediated.

Vertebrae: Vertebral body height maintained without acute or chronic
fracture. Bone marrow signal intensity within normal limits.
Multiple scattered benign hemangiomata noted. No worrisome osseous
lesions. Prominent reactive marrow edema present about the L4-5
facets due to facet arthritis, greater on the left.

Conus medullaris and cauda equina: Conus extends to the L1 level.
Conus medullaris within normal limits. Prominent fatty filum
terminale again noted.

Paraspinal and other soft tissues: Unremarkable.

Disc levels:

L1-2:  Unremarkable.

L2-3: Trace retrolisthesis. Mild disc bulge with disc desiccation.
Superimposed small right extraforaminal disc protrusion closely
approximates the exiting right L2 nerve root (series 14, image 15).
Mild facet hypertrophy with small joint effusions. No spinal
stenosis. Foramina remain patent.

L3-4: Trace retrolisthesis. Mild disc bulge with disc desiccation.
Superimposed shallow left subarticular to foraminal disc protrusion
(series 14, image 20). Superimposed subtle tiny disc extrusion with
inferior migration present as well (series 14, image 22). Disc
material closely approximates and/or contacts both the left L3 and
descending L4 nerve roots. Superimposed mild facet hypertrophy. Mild
left lateral recess stenosis. Central canal remains patent. No
significant foraminal encroachment. Narrowing of the left lateral
recess.

L4-5: Trace anterolisthesis. Mild disc bulge. Superimposed shallow
central to right foraminal disc protrusion (series 14, image 25).
Moderate facet and ligament flavum hypertrophy with prominent joint
effusions and reactive marrow edema. No significant spinal stenosis.
Mild bilateral L4 foraminal narrowing.

L5-S1: Disc desiccation with minimal disc bulge. Mild facet
hypertrophy. No spinal stenosis. Foramina remain patent.
IMPRESSION: 1. Small right extraforaminal disc protrusion at L2-3, closely
approximating and potentially irritating the exiting right L2 nerve
root.
2. Shallow left subarticular to foraminal disc protrusion/extrusion
at L3-4, potentially affecting either the left L3 or descending L4
nerve roots.
3. Shallow central to right foraminal disc protrusion at L4-5
without impingement.
4. Moderate facet hypertrophy at L4-5 with prominent joint effusions
and reactive marrow edema. Finding could serve as a source for lower
back pain.

## 2023-05-16 NOTE — Procedures (Signed)
Lumbar Facet Joint Nerve Denervation  Patient: Nancy [name]      Date of Birth: 18-Jan-1966 MRN: [id number] PCP: Shelva Majestic, MD      Visit Date: 05/08/2023   Universal Protocol:    Date/Time: 10/15/246:35 PM  Consent Given By: the patient  Position: PRONE  Additional Comments: Vital signs were monitored before and after the procedure. Patient was prepped and draped in the usual sterile fashion. The correct patient, procedure, and site was verified.   Injection Procedure Details:   Procedure diagnoses:  1. Spondylosis without myelopathy or radiculopathy, lumbar region      Meds Administered:  Meds ordered this encounter  Medications   methylPREDNISolone acetate (DEPO-MEDROL) injection 40 mg     Laterality: Left  Location/Site:  L4-L5, L3 and L4 medial branches and L5-S1, L4 medial branch and L5 dorsal ramus  Needle: 18 ga.,  10mm active tip, RF Cannula  Needle Placement: Along juncture of superior articular process and transverse pocess  Findings:  -Comments:  Procedure Details: For each desired target nerve, the corresponding transverse process (sacral ala for the L5 dorsal rami) was identified and the fluoroscope was positioned to square off the endplates of the corresponding vertebral body to achieve a true AP midline view.  The beam was then obliqued 15 to 20 degrees and caudally tilted 15 to 20 degrees to line up a trajectory along the target nerves. The skin over the target of the junction of superior articulating process and transverse process (sacral ala for the L5 dorsal rami) was infiltrated with 1ml of 1% Lidocaine without Epinephrine.  The 18 gauge 10mm active tip outer cannula was advanced in trajectory view to the target.  This procedure was repeated for each target nerve.  Then, for all levels, the outer cannula placement was fine-tuned and the position was then confirmed with bi-planar imaging.    Test stimulation was done both at  sensory and motor levels to ensure there was no radicular stimulation. The target tissues were then infiltrated with 1 ml of 1% Lidocaine without Epinephrine. Subsequently, a percutaneous neurotomy was carried out for 90 seconds at 80 degrees Celsius.  After the completion of the lesion, 1 ml of injectate was delivered. It was then repeated for each facet joint nerve mentioned above. Appropriate radiographs were obtained to verify the probe placement during the neurotomy.   Additional Comments:  No complications occurred Dressing: 2 x 2 sterile gauze and Band-Aid    Post-procedure details: Patient was observed during the procedure. Post-procedure instructions were reviewed.  Patient left the clinic in stable condition.

## 2023-05-22 ENCOUNTER — Other Ambulatory Visit: Payer: Self-pay

## 2023-05-22 ENCOUNTER — Ambulatory Visit: Payer: Commercial Managed Care - PPO | Admitting: Physical Medicine and Rehabilitation

## 2023-05-22 VITALS — BP 112/79 | HR 59

## 2023-05-22 DIAGNOSIS — M47816 Spondylosis without myelopathy or radiculopathy, lumbar region: Secondary | ICD-10-CM | POA: Diagnosis not present

## 2023-05-22 NOTE — Progress Notes (Signed)
Functional Pain Scale - descriptive words and definitions  Uncomfortable (3)  Pain is present but can complete all ADL's/sleep is slightly affected and passive distraction only gives marginal relief. Mild range order  Average Pain 3   +Driver, -BT, -Dye Allergies.  Lower back pain right side RFA

## 2023-05-22 NOTE — Patient Instructions (Signed)

## 2023-05-29 ENCOUNTER — Encounter: Payer: Commercial Managed Care - PPO | Admitting: Obstetrics and Gynecology

## 2023-05-30 NOTE — Progress Notes (Signed)
Nancy [name] - 57 y.o. female MRN [id number]  Date of birth: 1966/05/18  Office Visit Note: Visit Date: 05/22/2023 PCP: Shelva Majestic, MD Referred by: Shelva Majestic, MD  Subjective: Chief Complaint  Patient presents with   Lower Back - Pain   HPI:  Nancy [name] is a 57 y.o. female who comes in todayfor planned radiofrequency ablation of the Right L4-5 and L5-S1 Lumbar facet joints. This would be ablation of the corresponding medial branches and/or dorsal rami.  Patient has had double diagnostic blocks with more than 50% relief.  These are documented on pain diary.  They have had chronic back pain for quite some time, more than 3 months, which has been an ongoing situation with recalcitrant axial back pain.  They have no radicular pain.  Their axial pain is worse with standing and ambulating and on exam today with facet loading.  They have had physical therapy as well as home exercise program.  The imaging noted in the chart below indicated facet pathology. Accordingly they meet all the criteria and qualification for for radiofrequency ablation and we are going to complete this today hopefully for more longer term relief as part of comprehensive management program.   ROS Otherwise per HPI.  Assessment & Plan: Visit Diagnoses:    ICD-10-CM   1. Spondylosis without myelopathy or radiculopathy, lumbar region  M47.816 XR C-ARM NO REPORT    Radiofrequency,Lumbar      Plan: No additional findings.   Meds & Orders: No orders of the defined types were placed in this encounter.   Orders Placed This Encounter  Procedures   Radiofrequency,Lumbar   XR C-ARM NO REPORT    Follow-up: Return if symptoms worsen or fail to improve.   Procedures: No procedures performed  Lumbar Facet Joint Nerve Denervation  Patient: Nancy [name]      Date of Birth: 1965-11-20 MRN: [id number] PCP: Shelva Majestic, MD      Visit Date: 05/22/2023   Universal Protocol:     Date/Time: 10/29/247:28 PM  Consent Given By: the patient  Position: PRONE  Additional Comments: Vital signs were monitored before and after the procedure. Patient was prepped and draped in the usual sterile fashion. The correct patient, procedure, and site was verified.   Injection Procedure Details:   Procedure diagnoses:  1. Spondylosis without myelopathy or radiculopathy, lumbar region      Meds Administered: No orders of the defined types were placed in this encounter.    Laterality: Right  Location/Site:  L4-L5, L3 and L4 medial branches and L5-S1, L4 medial branch and L5 dorsal ramus  Needle: 18 ga.,  10mm active tip, RF Cannula  Needle Placement: Along juncture of superior articular process and transverse pocess  Findings:  -Comments:  Procedure Details: For each desired target nerve, the corresponding transverse process (sacral ala for the L5 dorsal rami) was identified and the fluoroscope was positioned to square off the endplates of the corresponding vertebral body to achieve a true AP midline view.  The beam was then obliqued 15 to 20 degrees and caudally tilted 15 to 20 degrees to line up a trajectory along the target nerves. The skin over the target of the junction of superior articulating process and transverse process (sacral ala for the L5 dorsal rami) was infiltrated with 1ml of 1% Lidocaine without Epinephrine.  The 18 gauge 10mm active tip outer cannula was advanced in trajectory view to the target.  This procedure was repeated for  each target nerve.  Then, for all levels, the outer cannula placement was fine-tuned and the position was then confirmed with bi-planar imaging.    Test stimulation was done both at sensory and motor levels to ensure there was no radicular stimulation. The target tissues were then infiltrated with 1 ml of 1% Lidocaine without Epinephrine. Subsequently, a percutaneous neurotomy was carried out for 90 seconds at 80 degrees  Celsius.  After the completion of the lesion, 1 ml of injectate was delivered. It was then repeated for each facet joint nerve mentioned above. Appropriate radiographs were obtained to verify the probe placement during the neurotomy.   Additional Comments:  No complications occurred Dressing: 2 x 2 sterile gauze and Band-Aid    Post-procedure details: Patient was observed during the procedure. Post-procedure instructions were reviewed.  Patient left the clinic in stable condition.      Clinical History: CLINICAL DATA:  Lumbar radiculitis.   EXAM: MRI LUMBAR SPINE WITHOUT CONTRAST   TECHNIQUE: Multiplanar, multisequence MR imaging of the lumbar spine was performed. No intravenous contrast was administered.   COMPARISON:  MRI of the lumbar spine January 10, 2021.   FINDINGS: Segmentation:  Standard.   Alignment: Trace anterolisthesis of L4 over L5. Trace retrolisthesis at L2-3 and L3-4.   Vertebrae: No fracture, evidence of discitis, or aggressive bone lesion. Hemangiomas are seen at T11, L1, L4 and L5.   Conus medullaris and cauda equina: Conus extends to the L1 level. Small Filar lipoma, unchanged. Conus appear normal.   Paraspinal and other soft tissues: Negative.   Disc levels:   T12-L1: No spinal canal or neural foraminal stenosis.   L1-2: No spinal canal or neural foraminal stenosis.   L2-3: Shallow disc bulge with tiny right foraminal disc protrusion, mild facet degenerative changes with mild bilateral joint effusion. No significant spinal canal or neural foraminal stenosis. No significant change from prior.   L3-4: Disc bulge with superimposed small left central to subarticular disc protrusion causing indentation on the thecal sac and resulting in mild-to-moderate left subarticular zone stenosis, mildly progressed compared to prior. Mild facet degenerative changes with mild bilateral joint effusion. No significant neural foraminal narrowing.   L4-5:  Disc bulge, advanced hypertrophic facet degenerative changes with bilateral joint effusion and ligamentum flavum redundancy resulting in mild spinal canal stenosis with mild narrowing of the right subarticular zone and mild right neural foraminal narrowing. Compared to prior MRI, there is progression of joint effusion with significant improvement of associated periarticular and marrow edema.   L5-S1: Right asymmetric disc bulge and moderate facet degenerative changes resulting in mild right neural foraminal narrowing. No spinal canal stenosis.   IMPRESSION: 1. Mild progression of degenerative changes at L3-4 with mild-to-moderate left subarticular zone stenosis. 2. Mild spinal canal stenosis with mild narrowing of the right subarticular zone and mild right neural foraminal narrowing at L4-5. 3. Mild right neural foraminal narrowing at L5-S1.     Electronically Signed   By: Baldemar Lenis M.D.   On: 06/08/2022 08:15     Objective:  VS:  HT:    WT:   BMI:     BP:112/79  HR:(!) 59bpm  TEMP: ( )  RESP:  Physical Exam Vitals and nursing note reviewed.  Constitutional:      General: She is not in acute distress.    Appearance: Normal appearance. She is obese. She is not ill-appearing.  HENT:     Head: Normocephalic and atraumatic.     Right Ear:  External ear normal.     Left Ear: External ear normal.  Eyes:     Extraocular Movements: Extraocular movements intact.  Cardiovascular:     Rate and Rhythm: Normal rate.     Pulses: Normal pulses.  Pulmonary:     Effort: Pulmonary effort is normal. No respiratory distress.  Abdominal:     General: There is no distension.     Palpations: Abdomen is soft.  Musculoskeletal:        General: Tenderness present.     Cervical back: Neck supple.     Right lower leg: No edema.     Left lower leg: No edema.     Comments: Patient has good distal strength with no pain over the greater trochanters.  No clonus or focal  weakness.  Skin:    Findings: No erythema, lesion or rash.  Neurological:     General: No focal deficit present.     Mental Status: She is alert and oriented to person, place, and time.     Sensory: No sensory deficit.     Motor: No weakness or abnormal muscle tone.     Coordination: Coordination normal.  Psychiatric:        Mood and Affect: Mood normal.        Behavior: Behavior normal.      Imaging: No results found.

## 2023-05-30 NOTE — Procedures (Signed)
Lumbar Facet Joint Nerve Denervation  Patient: Nancy [name]      Date of Birth: Aug 01, 1966 MRN: [id number] PCP: Shelva Majestic, MD      Visit Date: 05/22/2023   Universal Protocol:    Date/Time: 10/29/247:28 PM  Consent Given By: the patient  Position: PRONE  Additional Comments: Vital signs were monitored before and after the procedure. Patient was prepped and draped in the usual sterile fashion. The correct patient, procedure, and site was verified.   Injection Procedure Details:   Procedure diagnoses:  1. Spondylosis without myelopathy or radiculopathy, lumbar region      Meds Administered: No orders of the defined types were placed in this encounter.    Laterality: Right  Location/Site:  L4-L5, L3 and L4 medial branches and L5-S1, L4 medial branch and L5 dorsal ramus  Needle: 18 ga.,  10mm active tip, RF Cannula  Needle Placement: Along juncture of superior articular process and transverse pocess  Findings:  -Comments:  Procedure Details: For each desired target nerve, the corresponding transverse process (sacral ala for the L5 dorsal rami) was identified and the fluoroscope was positioned to square off the endplates of the corresponding vertebral body to achieve a true AP midline view.  The beam was then obliqued 15 to 20 degrees and caudally tilted 15 to 20 degrees to line up a trajectory along the target nerves. The skin over the target of the junction of superior articulating process and transverse process (sacral ala for the L5 dorsal rami) was infiltrated with 1ml of 1% Lidocaine without Epinephrine.  The 18 gauge 10mm active tip outer cannula was advanced in trajectory view to the target.  This procedure was repeated for each target nerve.  Then, for all levels, the outer cannula placement was fine-tuned and the position was then confirmed with bi-planar imaging.    Test stimulation was done both at sensory and motor levels to ensure there was no  radicular stimulation. The target tissues were then infiltrated with 1 ml of 1% Lidocaine without Epinephrine. Subsequently, a percutaneous neurotomy was carried out for 90 seconds at 80 degrees Celsius.  After the completion of the lesion, 1 ml of injectate was delivered. It was then repeated for each facet joint nerve mentioned above. Appropriate radiographs were obtained to verify the probe placement during the neurotomy.   Additional Comments:  No complications occurred Dressing: 2 x 2 sterile gauze and Band-Aid    Post-procedure details: Patient was observed during the procedure. Post-procedure instructions were reviewed.  Patient left the clinic in stable condition.

## 2023-06-01 ENCOUNTER — Encounter: Payer: Self-pay | Admitting: Radiology

## 2023-06-01 ENCOUNTER — Ambulatory Visit: Payer: Commercial Managed Care - PPO | Admitting: Radiology

## 2023-06-01 VITALS — BP 124/82 | Ht 61.75 in | Wt 235.0 lb

## 2023-06-01 DIAGNOSIS — N907 Vulvar cyst: Secondary | ICD-10-CM | POA: Diagnosis not present

## 2023-06-01 DIAGNOSIS — N958 Other specified menopausal and perimenopausal disorders: Secondary | ICD-10-CM

## 2023-06-01 MED ORDER — IMVEXXY MAINTENANCE PACK 10 MCG VA INST
1.0000 | VAGINAL_INSERT | VAGINAL | 6 refills | Status: DC
Start: 2023-06-01 — End: 2024-01-11

## 2023-06-01 NOTE — Progress Notes (Signed)
Nancy [name] [facility] [birth year] [id number]   History:  57 y.o. G2P2 presents with c/o pustule on mons x 6 weeks, drained 5 days ago and is mostly resolved. She also is having trouble with vaginal dryness, no improvement with estrace cream.  Gynecologic History No LMP recorded. Patient has had a hysterectomy.   Contraception/Family planning: status post hysterectomy Sexually active: yes- painful  Obstetric History OB History  Gravida Para Term Preterm AB Living  2 2 2     2   SAB IAB Ectopic Multiple Live Births          2    # Outcome Date GA Lbr Len/2nd Weight Sex Type Anes PTL Lv  2 Term      CS-Unspec   LIV  1 Term      CS-Unspec   LIV    The following portions of the patient's history were reviewed and updated as appropriate: allergies, current medications, past family history, past medical history, past social history, past surgical history, and problem list.  Review of Systems  All other systems reviewed and are negative.   Past medical history, past surgical history, family history and social history were all reviewed and documented in the EPIC chart.  Exam:  Vitals:   06/01/23 1438  BP: 124/82  Weight: 235 lb (106.6 kg)  Height: 5' 1.75" (1.568 m)   Body mass index is 43.33 kg/m.  Physical Exam Vitals and nursing note reviewed. Exam conducted with a chaperone present.  Constitutional:      Appearance: Normal appearance. She is obese.  Pulmonary:     Effort: Pulmonary effort is normal.  Genitourinary:    General: Normal vulva.     Vagina: Erythema present.     Uterus: Absent.      Comments: Pustule has resolved. Vaginal moderately atrophic. Neurological:     General: No focal deficit present.     Mental Status: She is alert.  Psychiatric:        Mood and Affect: Mood normal.        Thought Content: Thought content normal.        Judgment: Judgment normal.      Raynelle Fanning, CMA present for exam  Assessment/Plan:   1. Genitourinary syndrome of  menopause - Estradiol (IMVEXXY MAINTENANCE PACK) 10 MCG INST; Place 1 tablet vaginally 2 (two) times a week.  Dispense: 8 each; Refill: 6    Return in about 8 weeks (around 07/27/2023) for AEX.  Olga Seyler B WHNP-BC 3:00 PM 06/01/2023

## 2023-06-06 ENCOUNTER — Other Ambulatory Visit (INDEPENDENT_AMBULATORY_CARE_PROVIDER_SITE_OTHER): Payer: Commercial Managed Care - PPO

## 2023-06-06 ENCOUNTER — Other Ambulatory Visit: Payer: Self-pay

## 2023-06-06 DIAGNOSIS — M0579 Rheumatoid arthritis with rheumatoid factor of multiple sites without organ or systems involvement: Secondary | ICD-10-CM | POA: Diagnosis not present

## 2023-06-06 LAB — SEDIMENTATION RATE: Sed Rate: 30 mm/h (ref 0–30)

## 2023-06-08 DIAGNOSIS — M797 Fibromyalgia: Secondary | ICD-10-CM | POA: Diagnosis not present

## 2023-06-08 DIAGNOSIS — M1990 Unspecified osteoarthritis, unspecified site: Secondary | ICD-10-CM | POA: Diagnosis not present

## 2023-06-08 DIAGNOSIS — M0579 Rheumatoid arthritis with rheumatoid factor of multiple sites without organ or systems involvement: Secondary | ICD-10-CM | POA: Diagnosis not present

## 2023-06-08 DIAGNOSIS — Z79899 Other long term (current) drug therapy: Secondary | ICD-10-CM | POA: Diagnosis not present

## 2023-06-15 ENCOUNTER — Other Ambulatory Visit: Payer: Self-pay

## 2023-06-15 ENCOUNTER — Other Ambulatory Visit (HOSPITAL_COMMUNITY): Payer: Self-pay

## 2023-06-15 ENCOUNTER — Encounter (HOSPITAL_COMMUNITY): Payer: Self-pay

## 2023-06-15 ENCOUNTER — Other Ambulatory Visit (HOSPITAL_BASED_OUTPATIENT_CLINIC_OR_DEPARTMENT_OTHER): Payer: Self-pay

## 2023-06-15 MED ORDER — ORENCIA 125 MG/ML ~~LOC~~ SOSY: 125.0000 mg | PREFILLED_SYRINGE | SUBCUTANEOUS | 4 refills | Status: DC

## 2023-06-16 ENCOUNTER — Other Ambulatory Visit: Payer: Self-pay

## 2023-06-16 ENCOUNTER — Other Ambulatory Visit (HOSPITAL_COMMUNITY): Payer: Self-pay

## 2023-06-19 ENCOUNTER — Other Ambulatory Visit: Payer: Self-pay | Admitting: Pharmacist

## 2023-06-19 ENCOUNTER — Other Ambulatory Visit: Payer: Self-pay

## 2023-06-19 ENCOUNTER — Ambulatory Visit: Payer: Commercial Managed Care - PPO | Attending: Family Medicine | Admitting: Pharmacist

## 2023-06-19 ENCOUNTER — Encounter (HOSPITAL_COMMUNITY): Payer: Self-pay

## 2023-06-19 DIAGNOSIS — Z79899 Other long term (current) drug therapy: Secondary | ICD-10-CM

## 2023-06-19 MED ORDER — ORENCIA 125 MG/ML ~~LOC~~ SOSY
125.0000 mg | PREFILLED_SYRINGE | SUBCUTANEOUS | 4 refills | Status: DC
  Filled 2023-06-20: qty 4, fill #0
  Filled 2023-06-20: qty 4, 28d supply, fill #0

## 2023-06-19 NOTE — Progress Notes (Signed)
   S: Patient presents for review of their specialty medication therapy.  Patient is currently taking Orencia for RA. Patient is managed by Dr. Deanne Coffer for this.   Adherence: confirms  FDA-approved dosing:  SubQ: 125 mg subQ once weekly. If initiating with an IV loading dose, administer the initial IV infusion per weight based dosing, then administer 125 mg subQ within 24 hours of infusion, followed by 125 mg subQ once weekly thereafter. If transitioning from IV therapy to subQ therapy: administer the first subQ dose instead of the next scheduled IV dose.   Dose adjustments: Renal impairment: none Hepatic impairment: none Toxicity: discontinue if serious infection develops  Drug-drug interactions: none identified   Screenings: TB screening: completed before IV infusions started  Hepatitis Screening: completed before IV infusions started  Blood glucose: Orencia contains maltose which make falsely elevate glucose levels  Monitoring: S/sx of infection: none  S/sx of hypersensitivity: none  Other adverse effects: none   O:     Lab Results  Component Value Date   WBC 7.7 03/08/2023   HGB 13.7 03/08/2023   HCT 43.5 03/08/2023   MCV 83.2 03/08/2023   PLT 213.0 03/08/2023      Chemistry      Component Value Date/Time   NA 137 03/08/2023 0807   NA 139 02/19/2021 0000   K 4.2 03/08/2023 0807   CL 95 (L) 03/08/2023 0807   CO2 33 (H) 03/08/2023 0807   BUN 20 03/08/2023 0807   BUN 18 02/19/2021 0000   CREATININE 0.76 03/08/2023 0807   CREATININE 0.88 06/16/2020 0805   GLU 106 02/19/2021 0000      Component Value Date/Time   CALCIUM 10.0 03/08/2023 0807   ALKPHOS 90 03/08/2023 0807   AST 13 03/08/2023 0807   ALT 13 03/08/2023 0807   BILITOT 0.5 03/08/2023 0807       A/P: 1. Medication review: Patient currently on Orencia for the treatment of rheumatoid arthritis and is tolerating it well. Reviewed the medication with the patient, including the following: Orencia is a  selective T-cell costimulation blocker indicated for rheumatoid arthritis. Patient educated on purpose, proper use and potential adverse effects of Orencia. The most common adverse effects are infections, headache, and injection site reactions. There is a possible adverse effect of increased risk of malignancy but it is not fully understood if this is due to the drug or the disease state itself. The patient was instructed to avoid use of live vaccinations without the approval of a physician. IV: Infuse over 30 minutes. Administer through a 0.2 to 1.2 micron low protein-binding filter. SubQ: Allow prefilled syringe and autoinjector to warm to room temperature (for 30 to 60 minutes and 30 minutes, respectively) prior to administration. Inject into the front of the thigh (preferred), abdomen (except for 2-inch area around the navel), or the outer area of the upper arms (if administered by a caregiver). Rotate injection sites (>=1 inch apart); do not administer into tender, bruised, red, or hard skin. No recommendations for any changes.  Butch Penny, PharmD, Patsy [place], CPP Clinical Pharmacist [facility] [phone]

## 2023-06-19 NOTE — Progress Notes (Signed)
Please see OV from 06/19/2023 for complete documentation.   Butch Penny, PharmD, Patsy [place], CPP Clinical Pharmacist [facility] [phone]

## 2023-06-20 ENCOUNTER — Telehealth: Payer: Self-pay | Admitting: Orthopaedic Surgery

## 2023-06-20 ENCOUNTER — Other Ambulatory Visit: Payer: Self-pay

## 2023-06-20 ENCOUNTER — Other Ambulatory Visit (HOSPITAL_COMMUNITY): Payer: Self-pay

## 2023-06-20 ENCOUNTER — Encounter (HOSPITAL_COMMUNITY): Payer: Self-pay

## 2023-06-20 NOTE — Telephone Encounter (Signed)
VOB submitted for Monovisc, bilateral knee  

## 2023-06-20 NOTE — Telephone Encounter (Signed)
Patient called to schedule her gel injection. Patient said the injections were already approved back in June.   Patient asked that I check to be sure ok to get the injection in December. The number to contact patient is [phone]

## 2023-06-20 NOTE — Progress Notes (Signed)
Specialty Pharmacy Initial Fill Coordination Note  Brena Gavigan is a 57 y.o. female contacted today regarding refills of specialty medication(s) Abatacept   Patient requested Delivery   Delivery date: 06/22/23   Verified address: [address]   Medication will be filled on 11/20.   Patient is aware of $0 copayment.

## 2023-06-26 ENCOUNTER — Other Ambulatory Visit: Payer: Self-pay

## 2023-06-26 ENCOUNTER — Other Ambulatory Visit (INDEPENDENT_AMBULATORY_CARE_PROVIDER_SITE_OTHER): Payer: Commercial Managed Care - PPO

## 2023-06-26 DIAGNOSIS — M0579 Rheumatoid arthritis with rheumatoid factor of multiple sites without organ or systems involvement: Secondary | ICD-10-CM

## 2023-06-26 LAB — CBC WITH DIFFERENTIAL/PLATELET
Basophils Absolute: 0.1 10*3/uL (ref 0.0–0.1)
Basophils Relative: 0.8 % (ref 0.0–3.0)
Eosinophils Absolute: 1.2 10*3/uL — ABNORMAL HIGH (ref 0.0–0.7)
Eosinophils Relative: 16 % — ABNORMAL HIGH (ref 0.0–5.0)
HCT: 40.1 % (ref 36.0–46.0)
Hemoglobin: 12.7 g/dL (ref 12.0–15.0)
Lymphocytes Relative: 25.6 % (ref 12.0–46.0)
Lymphs Abs: 1.9 10*3/uL (ref 0.7–4.0)
MCHC: 31.6 g/dL (ref 30.0–36.0)
MCV: 86.4 fL (ref 78.0–100.0)
Monocytes Absolute: 0.6 10*3/uL (ref 0.1–1.0)
Monocytes Relative: 8 % (ref 3.0–12.0)
Neutro Abs: 3.6 10*3/uL (ref 1.4–7.7)
Neutrophils Relative %: 49.6 % (ref 43.0–77.0)
Platelets: 204 10*3/uL (ref 150.0–400.0)
RBC: 4.64 Mil/uL (ref 3.87–5.11)
RDW: 14.3 % (ref 11.5–15.5)
WBC: 7.3 10*3/uL (ref 4.0–10.5)

## 2023-06-26 LAB — COMPREHENSIVE METABOLIC PANEL
ALT: 17 U/L (ref 0–35)
AST: 22 U/L (ref 0–37)
Albumin: 4.1 g/dL (ref 3.5–5.2)
Alkaline Phosphatase: 89 U/L (ref 39–117)
BUN: 15 mg/dL (ref 6–23)
CO2: 31 meq/L (ref 19–32)
Calcium: 9.4 mg/dL (ref 8.4–10.5)
Chloride: 96 meq/L (ref 96–112)
Creatinine, Ser: 0.7 mg/dL (ref 0.40–1.20)
GFR: 94.38 mL/min (ref >60.00)
Glucose, Bld: 90 mg/dL (ref 70–99)
Potassium: 4.3 meq/L (ref 3.5–5.1)
Sodium: 135 meq/L (ref 135–145)
Total Bilirubin: 0.4 mg/dL (ref 0.2–1.2)
Total Protein: 7.1 g/dL (ref 6.0–8.3)

## 2023-07-04 ENCOUNTER — Other Ambulatory Visit: Payer: Self-pay | Admitting: Physician Assistant

## 2023-07-04 ENCOUNTER — Other Ambulatory Visit (HOSPITAL_BASED_OUTPATIENT_CLINIC_OR_DEPARTMENT_OTHER): Payer: Self-pay

## 2023-07-04 MED ORDER — PREDNISONE 20 MG PO TABS
20.0000 mg | ORAL_TABLET | Freq: Every day | ORAL | 0 refills | Status: DC
  Filled 2023-07-04: qty 5, 5d supply, fill #0

## 2023-07-06 ENCOUNTER — Other Ambulatory Visit (HOSPITAL_COMMUNITY): Payer: Self-pay

## 2023-07-11 ENCOUNTER — Other Ambulatory Visit: Payer: Self-pay

## 2023-07-11 NOTE — Progress Notes (Signed)
Disenrolled patient from Specialty Services *Medication is not working*.

## 2023-07-13 ENCOUNTER — Ambulatory Visit: Payer: Commercial Managed Care - PPO | Admitting: Orthopaedic Surgery

## 2023-07-13 ENCOUNTER — Other Ambulatory Visit: Payer: Self-pay

## 2023-07-13 DIAGNOSIS — G8929 Other chronic pain: Secondary | ICD-10-CM

## 2023-07-13 DIAGNOSIS — M170 Bilateral primary osteoarthritis of knee: Secondary | ICD-10-CM

## 2023-07-13 DIAGNOSIS — M1711 Unilateral primary osteoarthritis, right knee: Secondary | ICD-10-CM

## 2023-07-13 DIAGNOSIS — M1712 Unilateral primary osteoarthritis, left knee: Secondary | ICD-10-CM

## 2023-07-13 MED ORDER — HYALURONAN 88 MG/4ML IX SOSY
88.0000 mg | PREFILLED_SYRINGE | INTRA_ARTICULAR | Status: AC | PRN
Start: 2023-07-13 — End: 2023-07-13
  Administered 2023-07-13: 88 mg via INTRA_ARTICULAR

## 2023-07-13 MED ORDER — LIDOCAINE HCL 1 % IJ SOLN
3.0000 mL | INTRAMUSCULAR | Status: AC | PRN
Start: 2023-07-13 — End: 2023-07-13
  Administered 2023-07-13: 3 mL

## 2023-07-13 NOTE — Progress Notes (Signed)
   Procedure Note  Patient: Nancy [name]             Date of Birth: 09-08-1965           MRN: [id number]             Visit Date: 07/13/2023  Procedures: Visit Diagnoses:  1. Unilateral primary osteoarthritis, right knee   2. Unilateral primary osteoarthritis, left knee     Large Joint Inj: R knee on 07/13/2023 3:39 PM Indications: diagnostic evaluation and pain Details: 22 G 1.5 in needle, superolateral approach  Arthrogram: No  Medications: 3 mL lidocaine 1 %; 88 mg Hyaluronan 88 MG/4ML Outcome: tolerated well, no immediate complications Procedure, treatment alternatives, risks and benefits explained, specific risks discussed. Consent was given by the patient. Immediately prior to procedure a time out was called to verify the correct patient, procedure, equipment, support staff and site/side marked as required. Patient was prepped and draped in the usual sterile fashion.    Large Joint Inj: L knee on 07/13/2023 3:39 PM Indications: diagnostic evaluation and pain Details: 22 G 1.5 in needle, superolateral approach  Arthrogram: No  Medications: 3 mL lidocaine 1 %; 88 mg Hyaluronan 88 MG/4ML Outcome: tolerated well, no immediate complications Procedure, treatment alternatives, risks and benefits explained, specific risks discussed. Consent was given by the patient. Immediately prior to procedure a time out was called to verify the correct patient, procedure, equipment, support staff and site/side marked as required. Patient was prepped and draped in the usual sterile fashion.    The patient is a 57 year old female who comes in today for hyaluronic acid injections in both knees to treat the pain from long-term osteoarthritis in her knees.  This is helped her the most in the past and she has had good response from hyaluronic acid in the past.  She has had no other acute change in her medical status.  She is scheduled to start infusions again for her rheumatologic disease  tomorrow.  Neither knee has an effusion.  Both knees hyperextend.  We did place lidocaine followed by Monovisc in both knees today which she tolerated well.  She knows that we can repeat these in 6 months if needed.  All questions and concerns were answered and addressed.  Lot number: [id number]

## 2023-07-14 DIAGNOSIS — M0579 Rheumatoid arthritis with rheumatoid factor of multiple sites without organ or systems involvement: Secondary | ICD-10-CM | POA: Diagnosis not present

## 2023-07-21 ENCOUNTER — Other Ambulatory Visit: Payer: Self-pay

## 2023-08-04 ENCOUNTER — Other Ambulatory Visit (HOSPITAL_COMMUNITY): Payer: Self-pay

## 2023-08-04 ENCOUNTER — Other Ambulatory Visit: Payer: Self-pay | Admitting: Physician Assistant

## 2023-08-04 MED ORDER — IBUPROFEN 800 MG PO TABS
800.0000 mg | ORAL_TABLET | Freq: Three times a day (TID) | ORAL | 0 refills | Status: AC | PRN
  Filled 2023-08-04 – 2023-08-05 (×2): qty 30, 10d supply, fill #0

## 2023-08-05 ENCOUNTER — Other Ambulatory Visit (HOSPITAL_COMMUNITY): Payer: Self-pay

## 2023-08-07 ENCOUNTER — Other Ambulatory Visit: Payer: Self-pay

## 2023-08-16 ENCOUNTER — Other Ambulatory Visit (HOSPITAL_COMMUNITY): Payer: Self-pay

## 2023-08-17 DIAGNOSIS — M0579 Rheumatoid arthritis with rheumatoid factor of multiple sites without organ or systems involvement: Secondary | ICD-10-CM | POA: Diagnosis not present

## 2023-08-31 ENCOUNTER — Ambulatory Visit: Payer: Commercial Managed Care - PPO | Admitting: Radiology

## 2023-09-05 ENCOUNTER — Other Ambulatory Visit: Payer: Self-pay | Admitting: Family Medicine

## 2023-09-05 ENCOUNTER — Other Ambulatory Visit (HOSPITAL_BASED_OUTPATIENT_CLINIC_OR_DEPARTMENT_OTHER): Payer: Self-pay

## 2023-09-05 MED ORDER — TRAMADOL HCL 50 MG PO TABS
50.0000 mg | ORAL_TABLET | ORAL | 1 refills | Status: DC | PRN
  Filled 2023-09-05: qty 180, 23d supply, fill #0
  Filled 2023-12-28: qty 180, 23d supply, fill #1

## 2023-09-07 DIAGNOSIS — M797 Fibromyalgia: Secondary | ICD-10-CM | POA: Diagnosis not present

## 2023-09-07 DIAGNOSIS — Z79899 Other long term (current) drug therapy: Secondary | ICD-10-CM | POA: Diagnosis not present

## 2023-09-07 DIAGNOSIS — M0579 Rheumatoid arthritis with rheumatoid factor of multiple sites without organ or systems involvement: Secondary | ICD-10-CM | POA: Diagnosis not present

## 2023-09-07 DIAGNOSIS — M1990 Unspecified osteoarthritis, unspecified site: Secondary | ICD-10-CM | POA: Diagnosis not present

## 2023-09-13 ENCOUNTER — Other Ambulatory Visit (HOSPITAL_BASED_OUTPATIENT_CLINIC_OR_DEPARTMENT_OTHER): Payer: Self-pay

## 2023-09-13 ENCOUNTER — Other Ambulatory Visit: Payer: Self-pay

## 2023-09-14 ENCOUNTER — Other Ambulatory Visit (HOSPITAL_BASED_OUTPATIENT_CLINIC_OR_DEPARTMENT_OTHER): Payer: Self-pay

## 2023-09-14 DIAGNOSIS — M0579 Rheumatoid arthritis with rheumatoid factor of multiple sites without organ or systems involvement: Secondary | ICD-10-CM | POA: Diagnosis not present

## 2023-09-21 ENCOUNTER — Other Ambulatory Visit: Payer: Self-pay | Admitting: *Deleted

## 2023-09-21 DIAGNOSIS — D61818 Other pancytopenia: Secondary | ICD-10-CM

## 2023-09-21 NOTE — Progress Notes (Signed)
 Lab orders entered

## 2023-10-11 NOTE — Progress Notes (Unsigned)
 Rubin Payor, PhD, LAT, ATC acting as a scribe for Clementeen Graham, MD.  Abra Lingenfelter is a 58 y.o. female who presents to [facility] today for cont'd bilat knee pain. Pt was last seen by Dr. Denyse Amass for her knees on 10/06/22 and bilat knee steroid injections. Pt was seen by Dr. Magnus Ivan on 07/13/23 and was given bilat Monovisc injections.  Today, pt reports prior Monovisc injection did not provide any relief. She notes that she has been getting the gel shots since last June. She reports she was off of her RA infusion for the last 3 months.   Dx imaging: 12/01/22 R & L knee XR 11/12/2020 R knee XR   Pertinent review of systems: No fevers or chills  Relevant historical information: Rheumatoid arthritis osteoarthritis obesity.   Exam:  BP (!) 144/92   Pulse 62   Ht 5\' 1"  (1.549 m)   Wt 228 lb (103.4 kg)   SpO2 92%   BMI 43.08 kg/m  General: Well Developed, well nourished, and in no acute distress.   MSK: Knees bilaterally large effusion.  Decreased motion.    Lab and Radiology Results  Procedure: Real-time Ultrasound Guided Injection of right knee joint superior lateral patella space Device: Philips Affiniti 50G/GE Logiq Images permanently stored and available for review in PACS Verbal informed consent obtained.  Discussed risks and benefits of procedure. Warned about infection, bleeding, hyperglycemia damage to structures among others. Patient expresses understanding and agreement Time-out conducted.   Noted no overlying erythema, induration, or other signs of local infection.   Skin prepped in a sterile fashion.   Local anesthesia: Topical Ethyl chloride.   With sterile technique and under real time ultrasound guidance: 40 mg of Kenalog and 2 mL of Marcaine injected into knee joint. Fluid seen entering the joint capsule.   Completed without difficulty   Pain immediately resolved suggesting accurate placement of the medication.   Advised  to call if fevers/chills, erythema, induration, drainage, or persistent bleeding.   Images permanently stored and available for review in the ultrasound unit.  Impression: Technically successful ultrasound guided injection.      Procedure: Real-time Ultrasound Guided Injection of left knee joint superior lateral patella space Device: Philips Affiniti 50G/GE Logiq Images permanently stored and available for review in PACS Verbal informed consent obtained.  Discussed risks and benefits of procedure. Warned about infection, bleeding, hyperglycemia damage to structures among others. Patient expresses understanding and agreement Time-out conducted.   Noted no overlying erythema, induration, or other signs of local infection.   Skin prepped in a sterile fashion.   Local anesthesia: Topical Ethyl chloride.   With sterile technique and under real time ultrasound guidance: 40 mg of Kenalog and 2 mL of Marcaine injected into knee joint. Fluid seen entering the joint capsule.   Completed without difficulty   Pain immediately resolved suggesting accurate placement of the medication.   Advised to call if fevers/chills, erythema, induration, drainage, or persistent bleeding.   Images permanently stored and available for review in the ultrasound unit.  Impression: Technically successful ultrasound guided injection.        Assessment and Plan: 58 y.o. female with chronic bilateral knee pain due to DJD.  Unfortunately not doing well with conventional conservative management including intermittent steroid injections and hyaluronic acid injections.  We discussed pain overall and we will proceed to genicular artery embolization evaluation with interventional radiology.  This provide good long-term benefit bridging her to knee replacement  in the future.   PDMP not reviewed this encounter. Orders Placed This Encounter  Procedures   [place] LIMITED JOINT SPACE STRUCTURES LOW BILAT(NO LINKED CHARGES)     Reason for Exam (SYMPTOM  OR DIAGNOSIS REQUIRED):   bilateral knee pain    Preferred imaging location?:   [facility] Eval [facility]    Standing Status:   Future    Expiration Date:   10/11/2024    Scheduling Instructions:     Genicular Artery Embolization    Reason for Exam (SYMPTOM  OR DIAGNOSIS REQUIRED):   bilateral knee OA    Is patient pregnant?:   No    Preferred imaging location?:   [facility]   No orders of the defined types were placed in this encounter.    Discussed warning signs or symptoms. Please see discharge instructions. Patient expresses understanding.   The above documentation has been reviewed and is accurate and complete Clementeen Graham, M.D.

## 2023-10-11 NOTE — Progress Notes (Unsigned)
 New Hematology/Oncology Consult   Requesting MD: Dr. Casimer Lanius  [phone]      Reason for Consult: Eosinophilia  HPI: Nancy [name] is a 58 year old woman referred for evaluation of eosinophilia.  She had a CBC completed 09/14/2023.  Hemoglobin, total white count and platelets in normal range.  Absolute eosinophil count elevated at 1.2.  CBC from 06/26/2023 also with absolute eosinophil count of 1.2; 03/08/2023 0.0; 12/15/2022 absolute eosinophil count 0.5; 03/07/2022 0.2; 08/03/2021 0.1; 04/06/2021 0.1; 12/31/2020 0.1.  Past medical history significant for rheumatoid arthritis.  She is currently on leflunomide and abatacept.  She reports the abatacept was changed to a subcutaneous injection around September 2024.  RA symptoms worsened.  IV formulation was resumed in December 2024.  Symptoms are beginning to improve.  She has asthma which she feels is well-controlled, fibromyalgia, osteoarthritis.  No fever.  For the past 3 to 6 months she has been experiencing frequent night sweats.  Shirt and pillowcase will be "wet".  The sweats are not drenching.  She has had sweats in the past but notes increased frequency recently.  No recent infection.  She has a good appetite but has experienced about 10 pounds of unintentional weight loss in the past month.     Past Medical History:  Diagnosis Date   Anemia    Asthma    Bradycardia    Chicken pox    Dysrhythmia    occ. palpitations   Fibromyalgia    Headache(784.0)    Migraines    Neck pain    bulging disk   Polyarthralgia 02/21/2019   PONV (postoperative nausea and vomiting)    sometimes wakes up after surgery and cannot breath due to asthma, can be bradycardic   Rheumatoid arthritis involving multiple sites with positive rheumatoid factor (HCC) 02/21/2019   Shortness of breath    with bradycardia     Past Surgical History:  Procedure Laterality Date   ABDOMINAL HYSTERECTOMY     back procedure     nerve ablation to lower back  05/11/23, 1011/24   BACK SURGERY     cervical fusion C5,6,7 with a bulge at [facility]   BLADDER SURGERY  July 10th 2013   CESAREAN SECTION     1992 and 1995   CHOLECYSTECTOMY N/A 10/07/2021   Procedure: LAPAROSCOPIC CHOLECYSTECTOMY;  Surgeon: Abigail Miyamoto, MD;  Location: [facility];  Service: General;  Laterality: N/A;   COLONOSCOPY  11/27/2015   Dr.Pyrtle   CYSTOSCOPY N/A 07/31/2018   Procedure: Bluford Kaufmann;  Surgeon: Alfredo Martinez, MD;  Location: [facility];  Service: Urology;  Laterality: N/A;   DIAGNOSTIC LAPAROSCOPY     EXCISION OF MESH N/A 07/31/2018   Procedure: REMOVAL OF VAGINAL MESH;  Surgeon: Alfredo Martinez, MD;  Location: [facility];  Service: Urology;  Laterality: N/A;   LAPAROSCOPY Bilateral 05/31/2013   Procedure: LAPAROSCOPY withbilateral salpingo-oophorectomy, lysis of adhesions;  Surgeon: Leslie Andrea, MD;  Location: [facility];  Service: Gynecology;  Laterality: Bilateral;  with clippings of Vaginal  mesh   SALPINGOOPHORECTOMY Bilateral 05/31/2013   Procedure: BILATERAL SALPINGO OOPHORECTOMY/TRIM MESH;  Surgeon: Leslie Andrea, MD;  Location: [facility];  Service: Gynecology;  Laterality: Bilateral;   TUBAL LIGATION     1995   VAGINAL HYSTERECTOMY     1998 or 1997   WRIST FRACTURE SURGERY  2005     Current Outpatient Medications:    Calcium Carbonate (CALTRATE 600 PO), Take by mouth., Disp: , Rfl:    Cholecalciferol (VITAMIN  D3) 125 MCG (5000 UT) CAPS, Take by mouth daily., Disp: , Rfl:    cyanocobalamin 1000 MCG tablet, Take 1,000 mcg by mouth daily., Disp: , Rfl:    Estradiol (IMVEXXY MAINTENANCE PACK) 10 MCG INST, Place 1 tablet vaginally 2 (two) times a week., Disp: 8 each, Rfl: 6   folic acid (FOLVITE) 1 MG tablet, Take 1 tablet (1 mg total) by mouth daily., Disp: 90 tablet, Rfl: 3   gabapentin (NEURONTIN) 300 MG capsule, Take 1 capsule by mouth each morning and 1 capsule each afternoon and 2 capsules before bed as directed, Disp: 360 capsule,  Rfl: 3   hydrochlorothiazide (HYDRODIURIL) 25 MG tablet, Take 1 tablet (25 mg total) by mouth daily as needed for fluid retention/edema., Disp: 90 tablet, Rfl: 1   leflunomide (ARAVA) 20 MG tablet, Take 1 tablet (20 mg total) by mouth daily., Disp: 90 tablet, Rfl: 3   traMADol (ULTRAM) 50 MG tablet, Take 1-2 tablets (50-100 mg total) by mouth every 4-6 hours as needed for moderate pain (max of 8 per day), Disp: 180 tablet, Rfl: 1   Turmeric 500 MG CAPS, Take by mouth 2 (two) times daily., Disp: , Rfl:    valACYclovir (VALTREX) 1000 MG tablet, Take 1 tablet (1,000 mg total) by mouth 2 (two) times daily., Disp: 30 tablet, Rfl: 0   vitamin C (ASCORBIC ACID) 500 MG tablet, Take 500 mg by mouth as needed., Disp: , Rfl:    Abatacept (ORENCIA IV), Inject into the vein. (Patient not taking: Reported on 10/12/2023), Disp: , Rfl:    albuterol (VENTOLIN HFA) 108 (90 Base) MCG/ACT inhaler, Inhale 2 puffs into the lungs every 6 (six) hours as needed for wheezing or shortness of breath. (Patient not taking: Reported on 10/12/2023), Disp: 6.7 g, Rfl: 3   ibuprofen (ADVIL) 800 MG tablet, Take 1 tablet (800 mg total) by mouth every 8 (eight) hours as needed. (Patient not taking: Reported on 10/12/2023), Disp: 30 tablet, Rfl: 0   levocetirizine (XYZAL) 5 MG tablet, Take 1 tablet (5 mg total) by mouth every evening. (Patient not taking: Reported on 10/12/2023), Disp: 90 tablet, Rfl: 1   melatonin 5 MG TABS, 1 tablet at bedtime as needed with food (Patient not taking: Reported on 10/12/2023), Disp: , Rfl:    ondansetron (ZOFRAN-ODT) 4 MG disintegrating tablet, Take 1 tablet (4 mg total) by mouth every 8 (eight) hours as needed for nausea or vomiting. (Patient not taking: Reported on 10/12/2023), Disp: 20 tablet, Rfl: 0   predniSONE (DELTASONE) 20 MG tablet, Take 1 tablet (20 mg total) by mouth daily with breakfast. (Patient not taking: Reported on 10/12/2023), Disp: 5 tablet, Rfl: 0:     Allergies  Allergen Reactions    Advair Diskus [Fluticasone-Salmeterol] Cough    excessive   Erythromycin Diarrhea and Nausea And Vomiting   Percocet [Oxycodone-Acetaminophen] Other (See Comments)    hallucinations    FH: Mother has rheumatoid arthritis, hypertension, prediabetes, COPD.  Father hypertension.  Maternal grandfather died in his 51s with colon cancer.  Maternal grandmother had an "autoimmune disease".  2 maternal aunts diagnosed with uterine cancer.  SOCIAL HISTORY: She lives in [place].  She is a Engineer, civil (consulting).  No tobacco use.  Occasional EtOH intake.  Review of Systems: In addition to above noted in the HPI-mild occasional headache.  No vision change.  No rash.  No cough or shortness of breath.  No change in bowel habits.  No urinary symptoms.  Numbness/tingling left foot/toes related to "back problems".  Physical Exam:  Blood pressure (!) 142/70, pulse 72, temperature 98.2 F (36.8 C), temperature source Temporal, resp. rate 18, height 5\' 1"  (1.549 m), weight 227 lb 12.8 oz (103.3 kg), SpO2 96%.  HEENT: No thrush or ulcers.  No neck mass. Lungs: Lungs clear bilaterally. Cardiac: Regular rate and rhythm. Abdomen: No hepatosplenomegaly. Vascular: Trace edema lower leg bilaterally. Lymph nodes: No palpable cervical, supraclavicular, axillary or inguinal lymph nodes. Neurologic: Alert and oriented. Skin: No rash. Musculoskeletal: Left elbow and left wrist with mild edema.  LABS:   Recent Labs    10/12/23 1131  WBC 6.9  HGB 12.8  HCT 40.9  PLT 214  Peripheral blood smear-slight increase in eosinophils, few bands, no blasts or other young forms; platelets appear normal in number; moderate number of ovalocytes, rare cigar cell, few microcytes and teardrops, no nucleated red cells, polychromasia not increased.  No results for input(s): "NA", "K", "CL", "CO2", "GLUCOSE", "BUN", "CREATININE", "CALCIUM" in the last 72 hours.    RADIOLOGY:  No results found.  Assessment and Plan:    Eosinophilia Rheumatoid arthritis Asthma Fibromyalgia 3 to 76-month history of night sweats, 1 month history of 10 pound unintentional weight loss Family history of colon cancer, uterine cancer (maternal)  Nancy [name] has stable, mild eosinophilia noted over the past 4 to 5 months.  The differential diagnosis includes medication reaction, atypical infection, asthma, rheumatoid arthritis, malignancy.  Plan at present is to follow with observation with routine labs.   Etiology of the night sweats and weight loss is unclear.  She will contact the office with persistent or new symptoms.  She is up-to-date on cancer screenings.  She has a family history of colon cancer, uterine cancer.  Referral placed to genetics.  There were changes on the peripheral blood smear possibly indicating iron deficiency.  We will check a ferritin level.  She will return for a CBC and follow-up visit in 4 months.  We are available to see her sooner if needed.  Patient seen with Dr. Truett Perna.  Lonna Cobb, NP 10/12/2023, 3:28 PM  This was a shared visit Lonna Cobb.  Nancy [name] was interviewed and examined.  I reviewed the peripheral blood smear.  She is referred for evaluation of eosinophilia.  The eosinophil count has been elevated over the past several months.  There was very mild elevation of the eosinophil count last year.  There is no obvious explanation for the eosinophilia.  I have a low clinical suspicion for a primary hypereosinophilic syndrome or myeloproliferative disorder.  She does not have symptoms to suggest a systemic infection mastocytosis, or an allergic reaction.  The eosinophilia may be related to rheumatoid arthritis.  We obtained additional diagnostic evaluation today and she will return for a CBC in 4 months.  The peripheral blood smear has changes suggestive of possible iron deficiency.  We we will check a ferritin level.  I was present for greater than 50% today's visit.  I performed  medical decision making.  Mancel Bale, MD

## 2023-10-12 ENCOUNTER — Ambulatory Visit: Payer: Self-pay

## 2023-10-12 ENCOUNTER — Inpatient Hospital Stay: Payer: Commercial Managed Care - PPO | Admitting: Nurse Practitioner

## 2023-10-12 ENCOUNTER — Other Ambulatory Visit: Payer: Self-pay | Admitting: *Deleted

## 2023-10-12 ENCOUNTER — Inpatient Hospital Stay: Payer: Commercial Managed Care - PPO | Attending: Nurse Practitioner

## 2023-10-12 ENCOUNTER — Ambulatory Visit: Admitting: Family Medicine

## 2023-10-12 ENCOUNTER — Encounter: Payer: Self-pay | Admitting: Nurse Practitioner

## 2023-10-12 VITALS — BP 142/70 | HR 72 | Temp 98.2°F | Resp 18 | Ht 61.0 in | Wt 227.8 lb

## 2023-10-12 VITALS — BP 144/92 | HR 62 | Ht 61.0 in | Wt 228.0 lb

## 2023-10-12 DIAGNOSIS — G8929 Other chronic pain: Secondary | ICD-10-CM | POA: Diagnosis not present

## 2023-10-12 DIAGNOSIS — D721 Eosinophilia, unspecified: Secondary | ICD-10-CM

## 2023-10-12 DIAGNOSIS — J45909 Unspecified asthma, uncomplicated: Secondary | ICD-10-CM | POA: Diagnosis not present

## 2023-10-12 DIAGNOSIS — Z800 Family history of malignant neoplasm of digestive organs: Secondary | ICD-10-CM | POA: Diagnosis not present

## 2023-10-12 DIAGNOSIS — Z7952 Long term (current) use of systemic steroids: Secondary | ICD-10-CM | POA: Insufficient documentation

## 2023-10-12 DIAGNOSIS — M25562 Pain in left knee: Secondary | ICD-10-CM

## 2023-10-12 DIAGNOSIS — M069 Rheumatoid arthritis, unspecified: Secondary | ICD-10-CM | POA: Diagnosis not present

## 2023-10-12 DIAGNOSIS — M0579 Rheumatoid arthritis with rheumatoid factor of multiple sites without organ or systems involvement: Secondary | ICD-10-CM | POA: Diagnosis not present

## 2023-10-12 DIAGNOSIS — M170 Bilateral primary osteoarthritis of knee: Secondary | ICD-10-CM | POA: Diagnosis not present

## 2023-10-12 DIAGNOSIS — M25561 Pain in right knee: Secondary | ICD-10-CM | POA: Diagnosis not present

## 2023-10-12 DIAGNOSIS — D61818 Other pancytopenia: Secondary | ICD-10-CM

## 2023-10-12 DIAGNOSIS — Z79624 Long term (current) use of inhibitors of nucleotide synthesis: Secondary | ICD-10-CM | POA: Diagnosis not present

## 2023-10-12 DIAGNOSIS — Z8049 Family history of malignant neoplasm of other genital organs: Secondary | ICD-10-CM | POA: Insufficient documentation

## 2023-10-12 DIAGNOSIS — M797 Fibromyalgia: Secondary | ICD-10-CM | POA: Insufficient documentation

## 2023-10-12 DIAGNOSIS — Z7969 Long term (current) use of other immunomodulators and immunosuppressants: Secondary | ICD-10-CM | POA: Insufficient documentation

## 2023-10-12 LAB — CBC WITH DIFFERENTIAL (CANCER CENTER ONLY)
Abs Immature Granulocytes: 0.01 10*3/uL (ref 0.00–0.07)
Basophils Absolute: 0.1 10*3/uL (ref 0.0–0.1)
Basophils Relative: 1 %
Eosinophils Absolute: 1.1 10*3/uL — ABNORMAL HIGH (ref 0.0–0.5)
Eosinophils Relative: 15 %
HCT: 40.9 % (ref 36.0–46.0)
Hemoglobin: 12.8 g/dL (ref 12.0–15.0)
Immature Granulocytes: 0 %
Lymphocytes Relative: 27 %
Lymphs Abs: 1.8 10*3/uL (ref 0.7–4.0)
MCH: 26.5 pg (ref 26.0–34.0)
MCHC: 31.3 g/dL (ref 30.0–36.0)
MCV: 84.7 fL (ref 80.0–100.0)
Monocytes Absolute: 0.6 10*3/uL (ref 0.1–1.0)
Monocytes Relative: 9 %
Neutro Abs: 3.4 10*3/uL (ref 1.7–7.7)
Neutrophils Relative %: 48 %
Platelet Count: 214 10*3/uL (ref 150–400)
RBC: 4.83 MIL/uL (ref 3.87–5.11)
RDW: 13.4 % (ref 11.5–15.5)
WBC Count: 6.9 10*3/uL (ref 4.0–10.5)
nRBC: 0 % (ref 0.0–0.2)

## 2023-10-12 LAB — LACTATE DEHYDROGENASE: LDH: 184 U/L (ref 98–192)

## 2023-10-12 LAB — SEDIMENTATION RATE: Sed Rate: 36 mm/h — ABNORMAL HIGH (ref 0–22)

## 2023-10-12 LAB — FERRITIN: Ferritin: 54 ng/mL (ref 11–307)

## 2023-10-12 LAB — SAVE SMEAR(SSMR), FOR PROVIDER SLIDE REVIEW

## 2023-10-12 NOTE — Patient Instructions (Signed)
 Thank you for coming in today.   You received an injection today. Seek immediate medical attention if the joint becomes red, extremely painful, or is oozing fluid.   I've referred you to Interventional Radiology for a consult about a Genicular Artery Embolization.  Let us know if you don't hear from them in one week.

## 2023-10-13 ENCOUNTER — Other Ambulatory Visit: Payer: Self-pay | Admitting: Nurse Practitioner

## 2023-10-13 ENCOUNTER — Telehealth: Payer: Self-pay | Admitting: Nurse Practitioner

## 2023-10-13 ENCOUNTER — Other Ambulatory Visit: Payer: Self-pay | Admitting: *Deleted

## 2023-10-13 DIAGNOSIS — D721 Eosinophilia, unspecified: Secondary | ICD-10-CM

## 2023-10-13 LAB — VITAMIN B12: Vitamin B-12: 505 pg/mL (ref 180–914)

## 2023-10-13 NOTE — Telephone Encounter (Signed)
 Spoke with patient confirming upcoming appointment

## 2023-10-17 ENCOUNTER — Other Ambulatory Visit (HOSPITAL_COMMUNITY): Payer: Self-pay

## 2023-10-17 ENCOUNTER — Other Ambulatory Visit: Payer: Self-pay | Admitting: Family Medicine

## 2023-10-17 ENCOUNTER — Other Ambulatory Visit: Payer: Self-pay

## 2023-10-17 MED ORDER — LEVOCETIRIZINE DIHYDROCHLORIDE 5 MG PO TABS
5.0000 mg | ORAL_TABLET | Freq: Every evening | ORAL | 1 refills | Status: DC
  Filled 2023-10-17: qty 90, 90d supply, fill #0
  Filled 2024-01-10: qty 90, 90d supply, fill #1

## 2023-10-19 ENCOUNTER — Encounter: Payer: Self-pay | Admitting: Family Medicine

## 2023-10-19 ENCOUNTER — Ambulatory Visit: Admitting: Family Medicine

## 2023-10-19 VITALS — BP 122/80 | HR 85 | Temp 97.2°F | Ht 61.0 in | Wt 225.0 lb

## 2023-10-19 DIAGNOSIS — M2550 Pain in unspecified joint: Secondary | ICD-10-CM | POA: Diagnosis not present

## 2023-10-19 DIAGNOSIS — M5416 Radiculopathy, lumbar region: Secondary | ICD-10-CM

## 2023-10-19 DIAGNOSIS — Z131 Encounter for screening for diabetes mellitus: Secondary | ICD-10-CM

## 2023-10-19 DIAGNOSIS — R634 Abnormal weight loss: Secondary | ICD-10-CM | POA: Diagnosis not present

## 2023-10-19 LAB — COMPREHENSIVE METABOLIC PANEL
ALT: 14 U/L (ref 0–35)
AST: 14 U/L (ref 0–37)
Albumin: 4.1 g/dL (ref 3.5–5.2)
Alkaline Phosphatase: 99 U/L (ref 39–117)
BUN: 19 mg/dL (ref 6–23)
CO2: 31 meq/L (ref 19–32)
Calcium: 9.5 mg/dL (ref 8.4–10.5)
Chloride: 94 meq/L — ABNORMAL LOW (ref 96–112)
Creatinine, Ser: 0.83 mg/dL (ref 0.40–1.20)
GFR: 76.76 mL/min (ref >60.00)
Glucose, Bld: 88 mg/dL (ref 70–99)
Potassium: 3.6 meq/L (ref 3.5–5.1)
Sodium: 135 meq/L (ref 135–145)
Total Bilirubin: 0.4 mg/dL (ref 0.2–1.2)
Total Protein: 7.2 g/dL (ref 6.0–8.3)

## 2023-10-19 LAB — TSH: TSH: 1.37 u[IU]/mL (ref 0.35–5.50)

## 2023-10-19 LAB — HEMOGLOBIN A1C: Hgb A1c MFr Bld: 6.1 % (ref 4.6–6.5)

## 2023-10-19 NOTE — Progress Notes (Signed)
 Phone [phone] In person visit   Subjective:   Nancy [name] is a 57 y.o. year old very pleasant female patient who presents for/with See problem oriented charting Chief Complaint  Patient presents with   discuss hematology   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    Priority: High   Fibromyalgia 04/12/2018    Priority: High   Hyperlipidemia 12/25/2019    Priority: Medium    Vaginal dryness 04/12/2018    Priority: Medium    Morbid obesity (HCC) 04/12/2018    Priority: Medium    History of colon polyps 04/12/2018    Priority: Medium    Primary osteoarthritis of both knees 12/28/2017    Priority: Medium    Mild intermittent asthma 11/20/2013    Priority: Medium    Bradycardia 07/04/2012    Priority: Medium    Vitamin B12 deficiency 02/14/2019    Priority: Low   Vitamin D deficiency 04/12/2018    Priority: Low   History of total hysterectomy 04/12/2018    Priority: Low   Insomnia 04/12/2018    Priority: Low   Cervicogenic headache 03/26/2014    Priority: Low   Cervical radiculopathy 03/26/2014    Priority: Low   Lumbar radiculopathy 03/16/2023   Asthma 03/07/2022   Urge incontinence 12/20/2018   Urinary frequency 12/20/2018    Medications- reviewed and updated Current Outpatient Medications  Medication Sig Dispense Refill   Abatacept (ORENCIA IV) Inject into the vein.     albuterol (VENTOLIN HFA) 108 (90 Base) MCG/ACT inhaler Inhale 2 puffs into the lungs every 6 (six) hours as needed for wheezing or shortness of breath. 6.7 g 3   Calcium Carbonate (CALTRATE 600 PO) Take by mouth.     Cholecalciferol (VITAMIN D3) 125 MCG (5000 UT) CAPS Take by mouth daily.     cyanocobalamin 1000 MCG tablet Take 1,000 mcg by mouth  daily.     Estradiol (IMVEXXY MAINTENANCE PACK) 10 MCG INST Place 1 tablet vaginally 2 (two) times a week. 8 each 6   folic acid (FOLVITE) 1 MG tablet Take 1 tablet (1 mg total) by mouth daily. 90 tablet 3   gabapentin (NEURONTIN) 300 MG capsule Take 1 capsule by mouth each morning and 1 capsule each afternoon and 2 capsules before bed as directed 360 capsule 3   hydrochlorothiazide (HYDRODIURIL) 25 MG tablet Take 1 tablet (25 mg total) by mouth daily as needed for fluid retention/edema. 90 tablet 1   ibuprofen (ADVIL) 800 MG tablet Take 1 tablet (800 mg total) by mouth every 8 (eight) hours as needed. 30 tablet 0   leflunomide (ARAVA) 20 MG tablet Take 1 tablet (20 mg total) by mouth daily. 90 tablet 3   levocetirizine (XYZAL) 5 MG tablet Take 1 tablet (5 mg total) by mouth every evening. 90 tablet 1   melatonin 5 MG TABS      Turmeric 500 MG CAPS Take by mouth 2 (two) times daily.     valACYclovir (VALTREX) 1000 MG tablet Take 1 tablet (1,000 mg total) by mouth 2 (two) times daily. 30 tablet 0   vitamin C (ASCORBIC ACID) 500 MG tablet Take 500 mg by mouth as needed.     ondansetron (ZOFRAN-ODT) 4 MG disintegrating tablet Take 1 tablet (4  mg total) by mouth every 8 (eight) hours as needed for nausea or vomiting. (Patient not taking: Reported on 10/19/2023) 20 tablet 0   predniSONE (DELTASONE) 20 MG tablet Take 1 tablet (20 mg total) by mouth daily with breakfast. (Patient not taking: Reported on 10/19/2023) 5 tablet 0   traMADol (ULTRAM) 50 MG tablet Take 1-2 tablets (50-100 mg total) by mouth every 4-6 hours as needed for moderate pain (max of 8 per day) (Patient not taking: Reported on 10/19/2023) 180 tablet 1   No current facility-administered medications for this visit.     Objective:  BP 122/80   Pulse 85   Temp (!) 97.2 F (36.2 C)   Ht 5\' 1"  (1.549 m)   Wt 225 lb (102.1 kg)   SpO2 98%   BMI 42.51 kg/m  Gen: NAD, resting comfortably CV: RRR no murmurs rubs or gallops Lungs: CTAB no  crackles, wheeze, rhonchi Abdomen: soft/nontender/nondistended/normal bowel sounds. No rebound or guarding.  Ext: Trace to 1+ edema right greater than left, elbow tenderness bilaterally with some edema Skin: warm, dry    Assessment and Plan   # Lumbar radiculopathy-previously referred to neurosurgery but apparently was told her insurance was not accepted there-she has researched this and they do in fact so she would like to be referred back to Dr. Danielle Dess for ongoing lumbar radiculopathy issues-referral placed today.  Reports ongoing toe numbness and radicular pain in the left leg  # Eosinophilia # Night sweats and weight loss S: From most recent oncology note 10/12/2023 " Nancy [name] is a 58 year old woman referred for evaluation of eosinophilia.  She had a CBC completed 09/14/2023.  Hemoglobin, total white count and platelets in normal range.  Absolute eosinophil count elevated at 1.2.  CBC from 06/26/2023 also with absolute eosinophil count of 1.2; 03/08/2023 0.0; 12/15/2022 absolute eosinophil count 0.5; 03/07/2022 0.2; 08/03/2021 0.1; 04/06/2021 0.1; 12/31/2020 0.1.   Past medical history significant for rheumatoid arthritis.  She is currently on leflunomide and abatacept.  She reports the abatacept was changed to a subcutaneous injection around September 2024.  RA symptoms worsened.  IV formulation was resumed in December 2024.  Symptoms are beginning to improve.   She has asthma which she feels is well-controlled, fibromyalgia, osteoarthritis.   No fever.  For the past 3 to 6 months she has been experiencing frequent night sweats.  Shirt and pillowcase will be "wet".  The sweats are not drenching.  She has had sweats in the past but notes increased frequency recently.  No recent infection.  She has a good appetite but has experienced about 10 pounds of unintentional weight loss in the past month."  They did a smear and interpreted as "Peripheral blood smear-slight increase in eosinophils, few bands, no  blasts or other young forms; platelets appear normal in number; moderate number of ovalocytes, rare cigar cell, few microcytes and teardrops, no nucleated red cells, polychromasia not increased."  From assessment and plan"stable, mild eosinophilia noted over the past 4 to 5 months.  The differential diagnosis includes medication reaction, atypical infection, asthma, rheumatoid arthritis, malignancy.  Plan at present is to follow with observation with routine labs.    Etiology of the night sweats and weight loss is unclear.  She will contact the office with persistent or new symptoms.   She is up-to-date on cancer screenings.  She has a family history of colon cancer, uterine cancer.  Referral placed to genetics.   There were changes on the peripheral blood smear possibly  indicating iron deficiency.  We will check a ferritin level.   She will return for a CBC and follow-up visit in 4 months.  We are available to see her sooner if needed." -Dr. Truett Perna also mention this could be related to rheumatoid arthritis.  They have also mention possible iron deficiency based on smear but ferritin level was normal.   -on our scales down 9 lbs since last August- about down 10 on home scales in last 6 months and feels is not trying- mots of the weight loss is in the last month. Also sweats more intense in last month. Has noted more hair loss.  A/P: Patient with appropriate workup from hematology for eosinophilia.  She mentions they discussed the possibility of lupus but that labs for that were not specifically checked and she would like to check this today-Known rheumatoid arthritis and she does not fit classic demographic for lupus but I am certainly willing to check - Given also having some night sweats and unintentional weight loss also check TSH as well as A1c.  Discussed possible x-ray but we opted out after discussion -Has had recent colonoscopy within 2 years and we opted out of checking stool  cards  Recommended follow up: Return for next already scheduled visit or sooner if needed. Future Appointments  Date Time Provider Department Center  02/22/2024  1:15 PM [facility] PHLEBOTOMIST [facility] None  02/22/2024  1:45 PM Rana Snare, NP [facility] None  03/08/2024  2:20 PM Durene Cal Aldine Contes, MD [facility]    Lab/Order associations:   ICD-10-CM   1. Arthralgia, unspecified joint  M25.50 Urinalysis, Routine w reflex microscopic    ANA w/Reflex if Positive    Anti-Smith antibody    Anti-DNA antibody, double-stranded    Sjogren's syndrome antibods(ssa + ssb)    2. Weight loss  R63.4 TSH    Comprehensive metabolic panel    3. Screening for diabetes mellitus  Z13.1     4. Morbid obesity (HCC)  E66.01 Hemoglobin A1c    5. Lumbar radiculopathy  M54.16 Ambulatory referral to Neurosurgery      Time Spent: 31 minutes of total time (11:30 AM-12:01 PM) was spent on the date of the encounter performing the following actions: chart review prior to seeing the patient, obtaining history, performing a medically necessary exam, counseling on the workup/lipids evaluation plan and discussing other causes of weight loss potentially, placing orders, and documenting in our EHR.    Return precautions advised.  Tana Conch, MD

## 2023-10-19 NOTE — Patient Instructions (Addendum)
 Holding off on pneumonia and shingrix shots while not feeling well  Please stop by lab before you go If you have mychart- we will send your results within 3 business days of [place] receiving them.  If you do not have mychart- we will call you about results within 5 business days of [place] receiving them.  *please also note that you will see labs on mychart as soon as they post. I will later go in and write notes on them- will say "notes from Dr. Durene Cal"   We have placed a referral for you today to neurosurgery Dr. Danielle Dess- please call their # if you do not hear within a week (may be listed below or you may see mychart message within a few days with #).   Recommended follow up: Return for next already scheduled visit or sooner if needed.

## 2023-10-20 ENCOUNTER — Encounter: Payer: Self-pay | Admitting: Family Medicine

## 2023-10-20 LAB — ANA W/REFLEX IF POSITIVE
Anti JO-1: <0.2 AI (ref 0.0–0.9)
Anti Nuclear Antibody (ANA): POSITIVE — AB
Centromere Ab Screen: <0.2 AI (ref 0.0–0.9)
Chromatin Ab SerPl-aCnc: <0.2 AI (ref 0.0–0.9)
ENA RNP Ab: 3.2 AI — ABNORMAL HIGH (ref 0.0–0.9)
ENA SM Ab Ser-aCnc: <0.2 AI (ref 0.0–0.9)
ENA SSA (RO) Ab: <0.2 AI (ref 0.0–0.9)
ENA SSB (LA) Ab: <0.2 AI (ref 0.0–0.9)
Scleroderma (Scl-70) (ENA) Antibody, IgG: <0.2 AI (ref 0.0–0.9)
dsDNA Ab: <1 [IU]/mL (ref 0–9)

## 2023-10-20 LAB — URINALYSIS, ROUTINE W REFLEX MICROSCOPIC
Bilirubin Urine: NEGATIVE
Hgb urine dipstick: NEGATIVE
Ketones, ur: NEGATIVE
Leukocytes,Ua: NEGATIVE
Nitrite: NEGATIVE
Specific Gravity, Urine: 1.02 (ref 1.000–1.030)
Total Protein, Urine: NEGATIVE
Urine Glucose: NEGATIVE
Urobilinogen, UA: 0.2 (ref 0.0–1.0)
pH: 5.5 (ref 5.0–8.0)

## 2023-10-21 LAB — ANTI-SMITH ANTIBODY: ENA SM Ab Ser-aCnc: <1 AI

## 2023-10-21 LAB — SJOGREN'S SYNDROME ANTIBODS(SSA + SSB)
SSA (Ro) (ENA) Antibody, IgG: <1 AI
SSB (La) (ENA) Antibody, IgG: <1 AI

## 2023-10-21 LAB — ANTI-DNA ANTIBODY, DOUBLE-STRANDED: ds DNA Ab: <1 [IU]/mL

## 2023-10-23 ENCOUNTER — Encounter: Payer: Self-pay | Admitting: Family Medicine

## 2023-10-25 ENCOUNTER — Encounter: Payer: Self-pay | Admitting: Family Medicine

## 2023-10-25 ENCOUNTER — Other Ambulatory Visit: Payer: Self-pay

## 2023-11-02 ENCOUNTER — Telehealth: Payer: Self-pay | Admitting: Genetic Counselor

## 2023-11-02 NOTE — Telephone Encounter (Signed)
 Informed patient that we will be closing the referral as this is our third attempt.

## 2023-11-06 ENCOUNTER — Other Ambulatory Visit (HOSPITAL_COMMUNITY): Payer: Self-pay

## 2023-11-09 DIAGNOSIS — M0579 Rheumatoid arthritis with rheumatoid factor of multiple sites without organ or systems involvement: Secondary | ICD-10-CM | POA: Diagnosis not present

## 2023-11-26 ENCOUNTER — Other Ambulatory Visit: Payer: Self-pay | Admitting: Physician Assistant

## 2023-11-26 MED ORDER — POLYMYXIN B-TRIMETHOPRIM 10000-0.1 UNIT/ML-% OP SOLN: 1.0000 [drp] | OPHTHALMIC | 0 refills | Status: DC

## 2023-12-02 ENCOUNTER — Other Ambulatory Visit: Payer: Self-pay

## 2023-12-04 ENCOUNTER — Other Ambulatory Visit: Payer: Self-pay | Admitting: *Deleted

## 2023-12-04 ENCOUNTER — Other Ambulatory Visit (HOSPITAL_BASED_OUTPATIENT_CLINIC_OR_DEPARTMENT_OTHER): Payer: Self-pay

## 2023-12-04 MED ORDER — ESTRADIOL 10 MCG VA TABS
1.0000 | ORAL_TABLET | VAGINAL | 3 refills | Status: AC
  Filled 2023-12-04: qty 24, 84d supply, fill #0
  Filled 2024-03-26: qty 24, 84d supply, fill #1

## 2023-12-05 ENCOUNTER — Other Ambulatory Visit (HOSPITAL_COMMUNITY): Payer: Self-pay

## 2023-12-05 ENCOUNTER — Other Ambulatory Visit: Payer: Self-pay

## 2023-12-05 ENCOUNTER — Other Ambulatory Visit (HOSPITAL_BASED_OUTPATIENT_CLINIC_OR_DEPARTMENT_OTHER): Payer: Self-pay

## 2023-12-08 ENCOUNTER — Other Ambulatory Visit (HOSPITAL_COMMUNITY): Payer: Self-pay

## 2023-12-14 DIAGNOSIS — Z79899 Other long term (current) drug therapy: Secondary | ICD-10-CM | POA: Diagnosis not present

## 2023-12-14 DIAGNOSIS — M797 Fibromyalgia: Secondary | ICD-10-CM | POA: Diagnosis not present

## 2023-12-14 DIAGNOSIS — M1990 Unspecified osteoarthritis, unspecified site: Secondary | ICD-10-CM | POA: Diagnosis not present

## 2023-12-14 DIAGNOSIS — R768 Other specified abnormal immunological findings in serum: Secondary | ICD-10-CM | POA: Diagnosis not present

## 2023-12-14 DIAGNOSIS — M0579 Rheumatoid arthritis with rheumatoid factor of multiple sites without organ or systems involvement: Secondary | ICD-10-CM | POA: Diagnosis not present

## 2023-12-21 ENCOUNTER — Encounter: Payer: Self-pay | Admitting: Family Medicine

## 2023-12-22 ENCOUNTER — Other Ambulatory Visit: Payer: Self-pay | Admitting: Neurological Surgery

## 2023-12-22 DIAGNOSIS — M4316 Spondylolisthesis, lumbar region: Secondary | ICD-10-CM

## 2023-12-22 DIAGNOSIS — M47816 Spondylosis without myelopathy or radiculopathy, lumbar region: Secondary | ICD-10-CM | POA: Diagnosis not present

## 2023-12-22 DIAGNOSIS — Z6841 Body Mass Index (BMI) 40.0 and over, adult: Secondary | ICD-10-CM | POA: Diagnosis not present

## 2023-12-26 ENCOUNTER — Ambulatory Visit: Admitting: Physical Therapy

## 2023-12-26 ENCOUNTER — Encounter: Payer: Self-pay | Admitting: Physical Therapy

## 2023-12-26 DIAGNOSIS — M5410 Radiculopathy, site unspecified: Secondary | ICD-10-CM

## 2023-12-26 DIAGNOSIS — M6281 Muscle weakness (generalized): Secondary | ICD-10-CM

## 2023-12-26 DIAGNOSIS — M5459 Other low back pain: Secondary | ICD-10-CM | POA: Diagnosis not present
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
## 2023-12-28 ENCOUNTER — Other Ambulatory Visit (HOSPITAL_BASED_OUTPATIENT_CLINIC_OR_DEPARTMENT_OTHER): Payer: Self-pay

## 2024-01-02 ENCOUNTER — Ambulatory Visit: Admitting: Physical Therapy

## 2024-01-02 ENCOUNTER — Encounter: Payer: Self-pay | Admitting: Physical Therapy

## 2024-01-02 DIAGNOSIS — M5459 Other low back pain: Secondary | ICD-10-CM

## 2024-01-02 DIAGNOSIS — M5410 Radiculopathy, site unspecified: Secondary | ICD-10-CM

## 2024-01-02 DIAGNOSIS — M6281 Muscle weakness (generalized): Secondary | ICD-10-CM | POA: Diagnosis not present
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
## 2024-01-04 ENCOUNTER — Ambulatory Visit
Admission: RE | Admit: 2024-01-04 | Discharge: 2024-01-04 | Disposition: A | Discharge status: Home / Self Care | Source: Ambulatory Visit | Attending: Neurological Surgery | Admitting: Neurological Surgery

## 2024-01-04 DIAGNOSIS — M4316 Spondylolisthesis, lumbar region: Secondary | ICD-10-CM | POA: Diagnosis not present

## 2024-01-04 DIAGNOSIS — M4722 Other spondylosis with radiculopathy, cervical region: Secondary | ICD-10-CM | POA: Diagnosis not present

## 2024-01-04 DIAGNOSIS — M5126 Other intervertebral disc displacement, lumbar region: Secondary | ICD-10-CM | POA: Diagnosis not present

## 2024-01-10 ENCOUNTER — Other Ambulatory Visit: Payer: Self-pay

## 2024-01-10 ENCOUNTER — Other Ambulatory Visit: Payer: Self-pay | Admitting: Family Medicine

## 2024-01-10 ENCOUNTER — Other Ambulatory Visit (HOSPITAL_COMMUNITY): Payer: Self-pay

## 2024-01-10 MED ORDER — FOLIC ACID 1 MG PO TABS
1.0000 mg | ORAL_TABLET | Freq: Every day | ORAL | 3 refills | Status: AC
  Filled 2024-01-10: qty 90, 90d supply, fill #0
  Filled 2024-04-11: qty 90, 90d supply, fill #1
  Filled 2024-07-16: qty 90, 90d supply, fill #2

## 2024-01-11 ENCOUNTER — Other Ambulatory Visit: Payer: Self-pay

## 2024-01-11 ENCOUNTER — Ambulatory Visit: Admitting: Physical Therapy

## 2024-01-11 ENCOUNTER — Ambulatory Visit: Admitting: Family Medicine

## 2024-01-11 ENCOUNTER — Telehealth: Payer: Self-pay

## 2024-01-11 ENCOUNTER — Encounter: Payer: Self-pay | Admitting: Family Medicine

## 2024-01-11 ENCOUNTER — Encounter: Payer: Self-pay | Admitting: Physical Therapy

## 2024-01-11 VITALS — BP 130/70 | HR 90 | Ht 61.0 in | Wt 228.0 lb

## 2024-01-11 VITALS — BP 130/70 | HR 90 | Temp 97.8°F | Ht 61.0 in | Wt 228.0 lb

## 2024-01-11 DIAGNOSIS — M5459 Other low back pain: Secondary | ICD-10-CM

## 2024-01-11 DIAGNOSIS — G8929 Other chronic pain: Secondary | ICD-10-CM | POA: Diagnosis not present

## 2024-01-11 DIAGNOSIS — E785 Hyperlipidemia, unspecified: Secondary | ICD-10-CM

## 2024-01-11 DIAGNOSIS — R609 Edema, unspecified: Secondary | ICD-10-CM | POA: Diagnosis not present

## 2024-01-11 DIAGNOSIS — G894 Chronic pain syndrome: Secondary | ICD-10-CM | POA: Diagnosis not present

## 2024-01-11 DIAGNOSIS — M0579 Rheumatoid arthritis with rheumatoid factor of multiple sites without organ or systems involvement: Secondary | ICD-10-CM

## 2024-01-11 DIAGNOSIS — M170 Bilateral primary osteoarthritis of knee: Secondary | ICD-10-CM | POA: Diagnosis not present

## 2024-01-11 DIAGNOSIS — M5416 Radiculopathy, lumbar region: Secondary | ICD-10-CM | POA: Diagnosis not present

## 2024-01-11 DIAGNOSIS — M5410 Radiculopathy, site unspecified: Secondary | ICD-10-CM | POA: Diagnosis not present

## 2024-01-11 DIAGNOSIS — M85851 Other specified disorders of bone density and structure, right thigh: Secondary | ICD-10-CM

## 2024-01-11 DIAGNOSIS — D721 Eosinophilia, unspecified: Secondary | ICD-10-CM | POA: Diagnosis not present

## 2024-01-11 DIAGNOSIS — M25561 Pain in right knee: Secondary | ICD-10-CM | POA: Diagnosis not present

## 2024-01-11 DIAGNOSIS — M6281 Muscle weakness (generalized): Secondary | ICD-10-CM | POA: Diagnosis not present

## 2024-01-11 DIAGNOSIS — M25562 Pain in left knee: Secondary | ICD-10-CM

## 2024-01-11 DIAGNOSIS — D849 Immunodeficiency, unspecified: Secondary | ICD-10-CM | POA: Diagnosis not present

## 2024-01-11 LAB — CBC WITH DIFFERENTIAL/PLATELET
Basophils Absolute: 0 10*3/uL (ref 0.0–0.1)
Basophils Relative: 0.5 % (ref 0.0–3.0)
Eosinophils Absolute: 0.4 10*3/uL (ref 0.0–0.7)
Eosinophils Relative: 6.5 % — ABNORMAL HIGH (ref 0.0–5.0)
HCT: 42.4 % (ref 36.0–46.0)
Hemoglobin: 13.6 g/dL (ref 12.0–15.0)
Lymphocytes Relative: 31.2 % (ref 12.0–46.0)
Lymphs Abs: 2 10*3/uL (ref 0.7–4.0)
MCHC: 32.1 g/dL (ref 30.0–36.0)
MCV: 83.7 fl (ref 78.0–100.0)
Monocytes Absolute: 0.7 10*3/uL (ref 0.1–1.0)
Monocytes Relative: 10.3 % (ref 3.0–12.0)
Neutro Abs: 3.3 10*3/uL (ref 1.4–7.7)
Neutrophils Relative %: 51.5 % (ref 43.0–77.0)
Platelets: 212 10*3/uL (ref 150.0–400.0)
RBC: 5.07 Mil/uL (ref 3.87–5.11)
RDW: 14.4 % (ref 11.5–15.5)
WBC: 6.5 10*3/uL (ref 4.0–10.5)

## 2024-01-11 NOTE — Patient Instructions (Addendum)
 Thank you for coming in today.   You received an injection today. Seek immediate medical attention if the joint becomes red, extremely painful, or is oozing fluid.   Check back as needed

## 2024-01-11 NOTE — Patient Instructions (Addendum)
 Schedule your bone density test at check out desk.  - located [address] across the street from [facility] - in the basement - you DO NEED an appointment for the bone density tests.   Please stop by lab before you go If you have mychart- we will send your results within 3 business days of us  receiving them.  If you do not have mychart- we will call you about results within 5 business days of us  receiving them.  *please also note that you will see labs on mychart as soon as they post. I will later go in and write notes on them- will say notes from Dr. Leverne Reading try the hydrochlorothiazide  daily for now even on off days and see if you can do compression every day instead of just trialing on workday- may keep swelling down more and make easier to wear on workdays  Recommended follow up: Return for next already scheduled visit or sooner if needed.

## 2024-01-11 NOTE — Progress Notes (Signed)
 Phone [phone] In person visit   Subjective:   Nancy [name] is a 58 y.o. year old very pleasant female patient who presents for/with See problem oriented charting Chief Complaint  Patient presents with   Leg Swelling    Pt c/o bilateral leg edema    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    Priority: High   Fibromyalgia 04/12/2018    Priority: High   Hyperlipidemia 12/25/2019    Priority: Medium    Vaginal dryness 04/12/2018    Priority: Medium    Morbid obesity (HCC) 04/12/2018    Priority: Medium    History of colon polyps 04/12/2018    Priority: Medium    Primary osteoarthritis of both knees 12/28/2017    Priority: Medium    Mild intermittent asthma 11/20/2013    Priority: Medium    Bradycardia 07/04/2012    Priority: Medium    Vitamin B12 deficiency 02/14/2019    Priority: Low   Vitamin D  deficiency 04/12/2018    Priority: Low   History of total hysterectomy 04/12/2018    Priority: Low   Insomnia 04/12/2018    Priority: Low   Cervicogenic headache 03/26/2014    Priority: Low   Cervical radiculopathy 03/26/2014    Priority: Low   Lumbar radiculopathy 03/16/2023   Asthma 03/07/2022   Urge incontinence 12/20/2018   Urinary frequency 12/20/2018    Medications- reviewed and updated Current Outpatient Medications  Medication Sig Dispense Refill   Abatacept  (ORENCIA  IV) Inject into the vein.     albuterol  (VENTOLIN  HFA) 108 (90 Base) MCG/ACT inhaler Inhale 2 puffs into the lungs every 6 (six) hours as needed for wheezing or shortness of breath. 6.7 g 3   Calcium Carbonate (CALTRATE 600 PO) Take by mouth.     Cholecalciferol  (VITAMIN D3) 125 MCG (5000 UT) CAPS Take by mouth daily.     cyanocobalamin  1000 MCG  tablet Take 1,000 mcg by mouth daily.     Estradiol  10 MCG TABS vaginal tablet Place 1 tablet (10 mcg total) vaginally 2 (two) times a week. 24 tablet 3   folic acid  (FOLVITE ) 1 MG tablet Take 1 tablet (1 mg total) by mouth daily. 90 tablet 3   gabapentin  (NEURONTIN ) 300 MG capsule Take 1 capsule by mouth each morning and 1 capsule each afternoon and 2 capsules before bed as directed 360 capsule 3   hydrochlorothiazide  (HYDRODIURIL ) 25 MG tablet Take 1 tablet (25 mg total) by mouth daily as needed for fluid retention/edema. 90 tablet 1   ibuprofen  (ADVIL ) 800 MG tablet Take 1 tablet (800 mg total) by mouth every 8 (eight) hours as needed. 30 tablet 0   leflunomide  (ARAVA ) 20 MG tablet Take 1 tablet (20 mg total) by mouth daily. 90 tablet 3   levocetirizine (XYZAL ) 5 MG tablet Take 1 tablet (5 mg total) by mouth every evening. 90 tablet 1   melatonin 5 MG TABS      ondansetron  (ZOFRAN -ODT) 4 MG disintegrating tablet Take 1 tablet (4 mg total) by mouth every 8 (eight) hours as needed for nausea or vomiting. 20 tablet 0   traMADol  (ULTRAM ) 50 MG tablet Take 1-2 tablets (50-100 mg total) by mouth every 4-6 hours as needed for moderate pain (max of 8 per  day) 180 tablet 1   Turmeric 500 MG CAPS Take by mouth 2 (two) times daily.     valACYclovir  (VALTREX ) 1000 MG tablet Take 1 tablet (1,000 mg total) by mouth 2 (two) times daily. 30 tablet 0   vitamin C (ASCORBIC ACID) 500 MG tablet Take 500 mg by mouth as needed.     No current facility-administered medications for this visit.     Objective:  BP 130/70   Pulse 90   Temp 97.8 F (36.6 C)   Ht 5' 1 (1.549 m)   Wt 228 lb (103.4 kg)   SpO2 95%   BMI 43.08 kg/m  Gen: NAD, resting comfortably CV: RRR no murmurs rubs or gallops Lungs: CTAB no crackles, wheeze, rhonchi Abdomen: soft/nontender/nondistended/normal bowel sounds. No rebound or guarding.  Ext: Trace to 1+ edema bilaterally-no calf pain.  Pain over base of fifth metatarsal on the left  with no surrounding erythema or warmth or skin breakdown Skin: warm, dry     Assessment and Plan   # Edema in bilateral legs S: Patient has noted worsening edema in bilateral legs starting in last 2-3 months and more pains in both feet worse on left - side of radiculopathy  -is taking hydrochlorothiazide  daily in week but not helping with edema anymore as it has in the past - legs get very swollen by end of day -tries to minimize her salt intake in gneral- has not had a recent increase - swelling equal in both legs - had pain with compression stockings and didn't seem to improve issue -prior lasix  was not helpful  - base of 5th metatarsal- did have a fall working in yard 2 months ago but fell on upper body and not the foot- no lingering upper body issues- fell over potting soil  A/P: Patient with history of bilateral leg edema likely related to venous insufficiency/obesity but has had partial improvement with hydrochlorothiazide  on workdays until the last 2 to 3 months when symptoms worsened -CBC and CMP reassuring within a month and was already having these symptoms but we will recheck a CBC with prior eosinophilia though improved on most recent check with rheumatology - Check TSH to evaluate for hypothyroidism, BNP (consider echocardiogram-also reports occasional palpitations but no chest pain or shortness of breath) and urinalysis to look for proteinuria -Encouraged her to use hydrochlorothiazide  daily-I think she has spaced on her blood pressure to tolerate this - Retrial compression stocking when using hydrochlorothiazide  every day and use compression stockings every day-has good pulses so I think she can tolerate this and I think if she uses it more consistently may help with the swelling -She travels to [place]  in August so really wants to get this down as that is often the place where symptoms further worsen -Gabapentin  could contribute to swelling but we did not opt to change that at  this time  -She also has some pain over the base of the fifth metatarsal-offered x-ray but she wants to hold off on this for now-May discussed with sports medicine in the future   # Rheumatoid arthritis/myalgia-doing reasonably well on Orencia -ANA thought to be incidental finding- not lupus. We do fill her leflunomide  as well which she has done well with-continue current medication.  Immunosuppressed status noted  # Lumbar radiculopathy-refer to neurosurgery Dr. Ellery Guthrie last visit with ongoing toe numbness and radicular pain in left leg- S: After last visit with Dr. Laverle Postin referred to Dr. Avelino Lek  (but ended up at [facility]) and advised MRI lumbar  spine which was completed about a week ago but results still pending  -She remains on gabapentin  300 mg 1 tablet in the morning and afternoon and 2 tablets before bed  -tramadol  if going to be more active at home and then often at bedtime takes 2 - not improving so far with PT A/P: Lumbar radiculopathy with ongoing pain-pending MRI results-continue gabapentin  and tramadol  through us  and other management through neurosurgery - Wants to address the back before considering knee surgery which is an ongoing issue as well  # Prior weight loss and eosinophilia - Prior weight loss has stabilized weight up 1 pound from last visit - Had workup with hematology for eosinophilia-recheck labs for possible lupus but only ANA was positive and she later discussed this with rheumatology as above - We also check TSH and A1c which were largely reassuring That she also had recent colonoscopy in 2 years we opted out of rechecking stool cards   -Reports has upcoming visit in July-we will recheck levels today  # Prediabetes noted with A1c up to a 6.1-continue work on healthy eating/exercise/weight loss  Recommended follow up: Return for next already scheduled visit or sooner if needed. Future Appointments  Date Time Provider Department Center  01/11/2024  1:15 PM Syliva Even, MD [facility] None  01/18/2024 12:15 PM Jettie Morse, PT [facility] None  01/25/2024  2:30 PM Jettie Morse, PT [facility] None  02/01/2024  3:15 PM Jettie Morse, PT [facility] None  02/08/2024 10:15 AM Jettie Morse, PT [facility] None  02/22/2024  1:15 PM [facility] PHLEBOTOMIST [facility] None  02/22/2024  1:45 PM Roseline Conine, NP [facility] None  03/07/2024  4:00 PM Arlene Ben Saverio Curling, MD [facility]    Lab/Order associations:   ICD-10-CM   1. Hyperlipidemia, unspecified hyperlipidemia type  E78.5 TSH    Urinalysis, Routine w reflex microscopic    2. Edema, unspecified type  R60.9 TSH    Urinalysis, Routine w reflex microscopic    Brain natriuretic peptide    3. Osteopenia of neck of right femur  M85.851 DG Bone Density    4. Eosinophilia, unspecified type  D72.10 CBC with Differential/Platelet    5. Chronic pain syndrome  G89.4     6. Rheumatoid arthritis involving multiple sites with positive rheumatoid factor (HCC)  M05.79     7. Immunosuppressed status (HCC)  D84.9     8. Lumbar radiculopathy  M54.16       No orders of the defined types were placed in this encounter.   Return precautions advised.  Clarisa Crooked, MD

## 2024-01-11 NOTE — Progress Notes (Signed)
 Joanna Muck, PhD, LAT, ATC acting as a scribe for Garlan Juniper, MD.  Nancy [name] is a 58 y.o. female who presents to [facility] today for exacerbation of her bilat knee pain. Pt was last seen by Dr. Alease Hunter on 10/12/23 and was given bilat knee steroid injections.  Today, pt reports bilat knee pain returned about a week ago. She wanted to get an injection prior to Dr. Alease Hunter being out of office.   Dx imaging: 12/01/22 R & L knee XR 11/12/2020 R knee XR   Pertinent review of systems: No fevers or chills  Relevant historical information: Rheumatoid arthritis and asthma.  Osteoarthritis both knees.   Exam:  BP 130/70   Pulse 90   Ht 5' 1 (1.549 m)   Wt 228 lb (103.4 kg) Comment: pt declined  SpO2 95%   BMI 43.08 kg/m  General: Well Developed, well nourished, and in no acute distress.   MSK: Bilateral knees moderate effusion normal-appearing otherwise.  Normal motion.    Lab and Radiology Results  Procedure: Real-time Ultrasound Guided Injection of right knee joint superior lateral patella space Device: Philips Affiniti 50G/GE Logiq Images permanently stored and available for review in PACS Verbal informed consent obtained.  Discussed risks and benefits of procedure. Warned about infection, bleeding, hyperglycemia damage to structures among others. Patient expresses understanding and agreement Time-out conducted.   Noted no overlying erythema, induration, or other signs of local infection.   Skin prepped in a sterile fashion.   Local anesthesia: Topical Ethyl chloride.   With sterile technique and under real time ultrasound guidance: 40 mg of Kenalog  and 2 mL of Marcaine  injected into knee joint. Fluid seen entering the joint capsule.   Completed without difficulty   Pain immediately resolved suggesting accurate placement of the medication.   Advised to call if fevers/chills, erythema, induration, drainage, or persistent bleeding.    Images permanently stored and available for review in the ultrasound unit.  Impression: Technically successful ultrasound guided injection.   Procedure: Real-time Ultrasound Guided Injection of left knee joint superior lateral patella space Device: Philips Affiniti 50G/GE Logiq Images permanently stored and available for review in PACS Verbal informed consent obtained.  Discussed risks and benefits of procedure. Warned about infection, bleeding, hyperglycemia damage to structures among others. Patient expresses understanding and agreement Time-out conducted.   Noted no overlying erythema, induration, or other signs of local infection.   Skin prepped in a sterile fashion.   Local anesthesia: Topical Ethyl chloride.   With sterile technique and under real time ultrasound guidance: 40 mg of Kenalog  and 2 mL of Marcaine  injected into knee joint. Fluid seen entering the joint capsule.   Completed without difficulty   Pain immediately resolved suggesting accurate placement of the medication.   Advised to call if fevers/chills, erythema, induration, drainage, or persistent bleeding.   Images permanently stored and available for review in the ultrasound unit.  Impression: Technically successful ultrasound guided injection.      Assessment and Plan: 58 y.o. female with bilateral knee pain due to DJD.  Plan for steroid injections today. Previous injection worked pretty well and lasted about 3 months.  Can repeat this again in 3 months if needed.  We also looked at a recent back MRI which has not yet to be read.  Recommend she let me know when the results come back.  Additionally she can get back in with Dr. Daisey Dryer.   PDMP not reviewed this encounter.  Orders Placed This Encounter  Procedures   US  LIMITED JOINT SPACE STRUCTURES LOW BILAT(NO LINKED CHARGES)    Reason for Exam (SYMPTOM  OR DIAGNOSIS REQUIRED):   bilateral knee pain    Preferred imaging location?:   [facility]   No orders of the defined types were placed in this encounter.    Discussed warning signs or symptoms. Please see discharge instructions. Patient expresses understanding.   The above documentation has been reviewed and is accurate and complete Garlan Juniper, M.D.

## 2024-01-11 NOTE — Telephone Encounter (Signed)
 Radiology dept contacted regarding MRI not resulted and patient having appts with PCP and Sports Medicine today. Important these are read so patient can get next steps in place. Spoke with Doylene Genet in Radiology and advised she would send these up for review hopefully before appt today with Sports Medicine.
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
## 2024-01-12 ENCOUNTER — Ambulatory Visit: Payer: Self-pay | Admitting: Family Medicine

## 2024-01-12 ENCOUNTER — Encounter: Payer: Self-pay | Admitting: Family Medicine

## 2024-01-12 LAB — URINALYSIS, ROUTINE W REFLEX MICROSCOPIC
Bilirubin Urine: NEGATIVE
Glucose, UA: NEGATIVE
Hgb urine dipstick: NEGATIVE
Ketones, ur: NEGATIVE
Leukocytes,Ua: NEGATIVE
Nitrite: NEGATIVE
Protein, ur: NEGATIVE
Specific Gravity, Urine: 1.017 (ref 1.001–1.035)
pH: <=5 — AB (ref 5.0–8.0)

## 2024-01-12 LAB — TSH: TSH: 2.5 m[IU]/L (ref 0.40–4.50)

## 2024-01-12 LAB — BRAIN NATRIURETIC PEPTIDE: Brain Natriuretic Peptide: 19 pg/mL (ref <100)

## 2024-01-12 NOTE — Telephone Encounter (Signed)
 Forwarding to Dr. Denyse Amass to review and advise.

## 2024-01-16 ENCOUNTER — Telehealth: Payer: Self-pay

## 2024-01-16 ENCOUNTER — Telehealth: Payer: Self-pay | Admitting: Physical Medicine and Rehabilitation

## 2024-01-16 NOTE — Telephone Encounter (Signed)
 [phone] patient called back stating she was calling because she is interested in having an ESI She said she has been seeing her neuro and he said she may have one with [facility] Maybe a consult appt?

## 2024-01-16 NOTE — Telephone Encounter (Signed)
 Pt called requesting an appt for back injection. Please call pt at [phone]

## 2024-01-18 ENCOUNTER — Encounter: Payer: Self-pay | Admitting: Physical Therapy

## 2024-01-18 ENCOUNTER — Ambulatory Visit: Admitting: Physical Therapy

## 2024-01-18 ENCOUNTER — Other Ambulatory Visit (HOSPITAL_COMMUNITY): Payer: Self-pay

## 2024-01-18 ENCOUNTER — Other Ambulatory Visit: Payer: Self-pay

## 2024-01-18 ENCOUNTER — Other Ambulatory Visit: Payer: Self-pay | Admitting: Family Medicine

## 2024-01-18 DIAGNOSIS — M5459 Other low back pain: Secondary | ICD-10-CM | POA: Diagnosis not present

## 2024-01-18 DIAGNOSIS — M6281 Muscle weakness (generalized): Secondary | ICD-10-CM

## 2024-01-18 DIAGNOSIS — M5410 Radiculopathy, site unspecified: Secondary | ICD-10-CM | POA: Diagnosis not present

## 2024-01-18 MED ORDER — HYDROCHLOROTHIAZIDE 25 MG PO TABS
25.0000 mg | ORAL_TABLET | Freq: Every day | ORAL | 1 refills | Status: DC | PRN
  Filled 2024-01-18: qty 90, 90d supply, fill #0
  Filled 2024-04-11: qty 90, 90d supply, fill #1
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
## 2024-01-25 ENCOUNTER — Encounter: Payer: Self-pay | Admitting: Physical Therapy

## 2024-01-25 ENCOUNTER — Ambulatory Visit: Admitting: Physical Therapy

## 2024-01-25 DIAGNOSIS — M6281 Muscle weakness (generalized): Secondary | ICD-10-CM | POA: Diagnosis not present

## 2024-01-25 DIAGNOSIS — M5459 Other low back pain: Secondary | ICD-10-CM

## 2024-01-25 DIAGNOSIS — M5410 Radiculopathy, site unspecified: Secondary | ICD-10-CM
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
## 2024-02-01 ENCOUNTER — Ambulatory Visit: Admitting: Physical Therapy

## 2024-02-01 ENCOUNTER — Telehealth: Payer: Self-pay

## 2024-02-01 ENCOUNTER — Encounter: Payer: Self-pay | Admitting: Physical Therapy

## 2024-02-01 DIAGNOSIS — M5459 Other low back pain: Secondary | ICD-10-CM | POA: Diagnosis not present

## 2024-02-01 DIAGNOSIS — M6281 Muscle weakness (generalized): Secondary | ICD-10-CM | POA: Diagnosis not present

## 2024-02-01 DIAGNOSIS — M47816 Spondylosis without myelopathy or radiculopathy, lumbar region: Secondary | ICD-10-CM

## 2024-02-01 DIAGNOSIS — G8929 Other chronic pain: Secondary | ICD-10-CM

## 2024-02-01 DIAGNOSIS — M5416 Radiculopathy, lumbar region: Secondary | ICD-10-CM

## 2024-02-01 DIAGNOSIS — M5410 Radiculopathy, site unspecified: Secondary | ICD-10-CM | POA: Diagnosis not present
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
## 2024-02-01 NOTE — Telephone Encounter (Signed)
 Order placed for [facility] and bilat facet inj at [facility].

## 2024-02-01 NOTE — Telephone Encounter (Signed)
 Pt would like orders placed for back injections with [facility].

## 2024-02-05 ENCOUNTER — Other Ambulatory Visit: Payer: Self-pay

## 2024-02-05 DIAGNOSIS — M170 Bilateral primary osteoarthritis of knee: Secondary | ICD-10-CM

## 2024-02-05 DIAGNOSIS — M797 Fibromyalgia: Secondary | ICD-10-CM

## 2024-02-05 DIAGNOSIS — M5416 Radiculopathy, lumbar region: Secondary | ICD-10-CM

## 2024-02-07 ENCOUNTER — Encounter: Admitting: Physical Therapy

## 2024-02-08 ENCOUNTER — Encounter: Admitting: Physical Therapy

## 2024-02-08 ENCOUNTER — Ambulatory Visit (INDEPENDENT_AMBULATORY_CARE_PROVIDER_SITE_OTHER)
Admission: RE | Admit: 2024-02-08 | Discharge: 2024-02-08 | Disposition: A | Discharge status: Home / Self Care | Source: Ambulatory Visit | Attending: Family Medicine | Admitting: Family Medicine

## 2024-02-08 DIAGNOSIS — M85851 Other specified disorders of bone density and structure, right thigh: Secondary | ICD-10-CM | POA: Diagnosis not present

## 2024-02-08 DIAGNOSIS — M0579 Rheumatoid arthritis with rheumatoid factor of multiple sites without organ or systems involvement: Secondary | ICD-10-CM | POA: Diagnosis not present

## 2024-02-15 ENCOUNTER — Other Ambulatory Visit

## 2024-02-15 ENCOUNTER — Ambulatory Visit: Admitting: Nurse Practitioner

## 2024-02-15 NOTE — Discharge Instructions (Signed)

## 2024-02-16 ENCOUNTER — Ambulatory Visit
Admission: RE | Admit: 2024-02-16 | Discharge: 2024-02-16 | Disposition: A | Discharge status: Home / Self Care | Source: Ambulatory Visit | Attending: Family Medicine | Admitting: Family Medicine

## 2024-02-16 DIAGNOSIS — G8929 Other chronic pain: Secondary | ICD-10-CM

## 2024-02-16 DIAGNOSIS — M48061 Spinal stenosis, lumbar region without neurogenic claudication: Secondary | ICD-10-CM | POA: Diagnosis not present

## 2024-02-16 DIAGNOSIS — M5416 Radiculopathy, lumbar region: Secondary | ICD-10-CM

## 2024-02-16 DIAGNOSIS — M47816 Spondylosis without myelopathy or radiculopathy, lumbar region: Secondary | ICD-10-CM

## 2024-02-16 MED ORDER — IOPAMIDOL (ISOVUE-M 200) INJECTION 41%
1.0000 mL | Freq: Once | INTRAMUSCULAR | Status: AC
  Administered 2024-02-16: 1 mL via EPIDURAL

## 2024-02-16 MED ORDER — METHYLPREDNISOLONE ACETATE 40 MG/ML INJ SUSP (RADIOLOG
80.0000 mg | Freq: Once | INTRAMUSCULAR | Status: AC
  Administered 2024-02-16: 80 mg via EPIDURAL

## 2024-02-20 ENCOUNTER — Telehealth: Payer: Self-pay | Admitting: Nurse Practitioner

## 2024-02-20 NOTE — Telephone Encounter (Signed)
 Returning PT call, PT wants to cancel appts for the 24th. Completed.

## 2024-02-22 ENCOUNTER — Inpatient Hospital Stay

## 2024-02-22 ENCOUNTER — Inpatient Hospital Stay: Admitting: Nurse Practitioner

## 2024-02-27 ENCOUNTER — Other Ambulatory Visit: Payer: Self-pay | Admitting: *Deleted

## 2024-02-27 DIAGNOSIS — M0579 Rheumatoid arthritis with rheumatoid factor of multiple sites without organ or systems involvement: Secondary | ICD-10-CM

## 2024-02-27 DIAGNOSIS — E559 Vitamin D deficiency, unspecified: Secondary | ICD-10-CM

## 2024-02-27 DIAGNOSIS — E785 Hyperlipidemia, unspecified: Secondary | ICD-10-CM

## 2024-02-27 DIAGNOSIS — E538 Deficiency of other specified B group vitamins: Secondary | ICD-10-CM

## 2024-02-27 DIAGNOSIS — R739 Hyperglycemia, unspecified: Secondary | ICD-10-CM

## 2024-02-29 NOTE — Discharge Instructions (Signed)

## 2024-03-01 ENCOUNTER — Other Ambulatory Visit: Payer: Self-pay | Admitting: Family Medicine

## 2024-03-01 ENCOUNTER — Other Ambulatory Visit: Payer: Self-pay

## 2024-03-01 ENCOUNTER — Ambulatory Visit
Admission: RE | Admit: 2024-03-01 | Discharge: 2024-03-01 | Disposition: A | Discharge status: Home / Self Care | Source: Ambulatory Visit | Attending: Family Medicine | Admitting: Family Medicine

## 2024-03-01 ENCOUNTER — Other Ambulatory Visit (HOSPITAL_COMMUNITY): Payer: Self-pay

## 2024-03-01 DIAGNOSIS — M5416 Radiculopathy, lumbar region: Secondary | ICD-10-CM

## 2024-03-01 DIAGNOSIS — G8929 Other chronic pain: Secondary | ICD-10-CM

## 2024-03-01 DIAGNOSIS — M47816 Spondylosis without myelopathy or radiculopathy, lumbar region: Secondary | ICD-10-CM

## 2024-03-01 DIAGNOSIS — M4726 Other spondylosis with radiculopathy, lumbar region: Secondary | ICD-10-CM | POA: Diagnosis not present

## 2024-03-01 MED ORDER — METHYLPREDNISOLONE ACETATE 40 MG/ML INJ SUSP (RADIOLOG
80.0000 mg | Freq: Once | INTRAMUSCULAR | Status: AC
  Administered 2024-03-01: 80 mg via INTRA_ARTICULAR

## 2024-03-01 MED ORDER — LEFLUNOMIDE 20 MG PO TABS
20.0000 mg | ORAL_TABLET | Freq: Every day | ORAL | 3 refills | Status: AC
  Filled 2024-03-01: qty 90, 90d supply, fill #0
  Filled 2024-06-10: qty 90, 90d supply, fill #1
  Filled 2024-07-16 – 2024-09-06 (×2): qty 90, 90d supply, fill #2

## 2024-03-01 MED ORDER — IOPAMIDOL (ISOVUE-M 200) INJECTION 41%
1.0000 mL | Freq: Once | INTRAMUSCULAR | Status: AC
  Administered 2024-03-01: 1 mL via INTRA_ARTICULAR

## 2024-03-04 ENCOUNTER — Other Ambulatory Visit (INDEPENDENT_AMBULATORY_CARE_PROVIDER_SITE_OTHER)

## 2024-03-04 ENCOUNTER — Ambulatory Visit: Payer: Self-pay | Admitting: Family Medicine

## 2024-03-04 ENCOUNTER — Other Ambulatory Visit: Payer: Self-pay

## 2024-03-04 DIAGNOSIS — M0579 Rheumatoid arthritis with rheumatoid factor of multiple sites without organ or systems involvement: Secondary | ICD-10-CM

## 2024-03-04 DIAGNOSIS — E559 Vitamin D deficiency, unspecified: Secondary | ICD-10-CM

## 2024-03-04 DIAGNOSIS — E538 Deficiency of other specified B group vitamins: Secondary | ICD-10-CM

## 2024-03-04 DIAGNOSIS — R739 Hyperglycemia, unspecified: Secondary | ICD-10-CM | POA: Diagnosis not present

## 2024-03-04 DIAGNOSIS — E785 Hyperlipidemia, unspecified: Secondary | ICD-10-CM | POA: Diagnosis not present

## 2024-03-04 LAB — CBC WITH DIFFERENTIAL/PLATELET
Basophils Absolute: 0 K/uL (ref 0.0–0.1)
Basophils Relative: 0.3 % (ref 0.0–3.0)
Eosinophils Absolute: 0 K/uL (ref 0.0–0.7)
Eosinophils Relative: 0.4 % (ref 0.0–5.0)
HCT: 42.8 % (ref 36.0–46.0)
Hemoglobin: 13.7 g/dL (ref 12.0–15.0)
Lymphocytes Relative: 15.9 % (ref 12.0–46.0)
Lymphs Abs: 1.2 K/uL (ref 0.7–4.0)
MCHC: 32.1 g/dL (ref 30.0–36.0)
MCV: 84.9 fl (ref 78.0–100.0)
Monocytes Absolute: 0.6 K/uL (ref 0.1–1.0)
Monocytes Relative: 7.4 % (ref 3.0–12.0)
Neutro Abs: 5.8 K/uL (ref 1.4–7.7)
Neutrophils Relative %: 76 % (ref 43.0–77.0)
Platelets: 221 K/uL (ref 150.0–400.0)
RBC: 5.04 Mil/uL (ref 3.87–5.11)
RDW: 14.8 % (ref 11.5–15.5)
WBC: 7.7 K/uL (ref 4.0–10.5)

## 2024-03-04 LAB — COMPREHENSIVE METABOLIC PANEL WITH GFR
ALT: 18 U/L (ref 0–35)
AST: 13 U/L (ref 0–37)
Albumin: 3.9 g/dL (ref 3.5–5.2)
Alkaline Phosphatase: 85 U/L (ref 39–117)
BUN: 22 mg/dL (ref 6–23)
CO2: 35 meq/L — ABNORMAL HIGH (ref 19–32)
Calcium: 9.4 mg/dL (ref 8.4–10.5)
Chloride: 96 meq/L (ref 96–112)
Creatinine, Ser: 0.63 mg/dL (ref 0.40–1.20)
GFR: 96.34 mL/min (ref >60.00)
Glucose, Bld: 89 mg/dL (ref 70–99)
Potassium: 4.1 meq/L (ref 3.5–5.1)
Sodium: 139 meq/L (ref 135–145)
Total Bilirubin: 0.4 mg/dL (ref 0.2–1.2)
Total Protein: 6.5 g/dL (ref 6.0–8.3)

## 2024-03-04 LAB — HEMOGLOBIN A1C: Hgb A1c MFr Bld: 6.3 % (ref 4.6–6.5)

## 2024-03-04 LAB — LIPID PANEL
Cholesterol: 276 mg/dL — ABNORMAL HIGH (ref 0–200)
HDL: 110.8 mg/dL (ref >39.00)
LDL Cholesterol: 152 mg/dL — ABNORMAL HIGH (ref 0–99)
NonHDL: 164.98
Total CHOL/HDL Ratio: 2
Triglycerides: 66 mg/dL (ref 0.0–149.0)
VLDL: 13.2 mg/dL (ref 0.0–40.0)

## 2024-03-04 LAB — TSH: TSH: 0.82 u[IU]/mL (ref 0.35–5.50)

## 2024-03-04 LAB — VITAMIN D 25 HYDROXY (VIT D DEFICIENCY, FRACTURES): VITD: 48.31 ng/mL (ref 30.00–100.00)

## 2024-03-04 LAB — SEDIMENTATION RATE: Sed Rate: 29 mm/h (ref 0–30)

## 2024-03-05 ENCOUNTER — Telehealth: Payer: Self-pay | Admitting: Family Medicine

## 2024-03-05 NOTE — Telephone Encounter (Signed)
 Patient dropped off document FMLA, to be filled out by provider. Patient requested to send it back via Fax within ASAP. Document is located in providers tray at front office.Please advise at [phone].

## 2024-03-06 NOTE — Telephone Encounter (Signed)
 Form completed and faxed back

## 2024-03-07 ENCOUNTER — Ambulatory Visit (INDEPENDENT_AMBULATORY_CARE_PROVIDER_SITE_OTHER): Admitting: Family Medicine

## 2024-03-07 ENCOUNTER — Encounter: Payer: Self-pay | Admitting: Family Medicine

## 2024-03-07 VITALS — BP 124/72 | HR 67 | Temp 97.0°F | Ht 61.0 in | Wt 234.0 lb

## 2024-03-07 DIAGNOSIS — M0579 Rheumatoid arthritis with rheumatoid factor of multiple sites without organ or systems involvement: Secondary | ICD-10-CM | POA: Diagnosis not present

## 2024-03-07 DIAGNOSIS — Z0000 Encounter for general adult medical examination without abnormal findings: Secondary | ICD-10-CM | POA: Diagnosis not present

## 2024-03-07 NOTE — Patient Instructions (Addendum)
 Glad you are seeing some improvements with how you are feeling!   Recommended follow up: Return in about 1 year (around 03/07/2025) for physical or sooner if needed.Schedule b4 you leave.  YOUR PLAN:  RHEUMATOID ARTHRITIS: Your rheumatoid arthritis is reasonably-controlled with your current medications, showing significant improvement. -Continue taking leflunomide  and receiving IV abatacept  (Orencia ).  OSTEOARTHRITIS OF KNEES: Your knee pain is well-managed with tramadol  and steroid injections. -Continue taking tramadol  as needed for pain. -Consider future steroid injections if needed.  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 -Continue current pain management regimen. -Consider further dose reduction of gabapentin  if pain remains controlled- you have moved down from 1200mg  per day to 300 mg twice daily- ok to further reduce to once a day but would try that for at least 2 weeks before further reductions  PREDIABETES: Your A1c level is slightly elevated, likely due to recent steroid injections. -Focus on dietary modifications and increased physical activity as able- glad you are doing the aquatic therapy  HYPERLIPIDEMIA: You have elevated LDL and HDL cholesterol levels. -Continue monitoring cholesterol levels and consider lifestyle changes to manage them.  OSTEOPENIA, RIGHT FEMORAL NECK: Your bone density is stable. -Repeat DEXA scan in 2-3 years.  VITAMIN D  DEFICIENCY: Your vitamin D  levels are stable with supplementation. -Continue taking vitamin D  supplements.  EDEMA AND CHRONIC VENOUS INSUFFICIENCY, LOWER EXTREMITIES: Your edema is well-managed with hydrochlorothiazide  and compression stockings. -Continue taking hydrochlorothiazide  as needed. -Continue using compression stockings regularly.  POSTMENOPAUSAL ATROPHIC VAGINITIS: Your vaginal dryness is managed with  vaginal estradiol . -Continue using vaginal estradiol .  SLEEP DISTURBANCE, POSSIBLE SLEEP APNEA: You have sleep disturbances and possible sleep apnea. -Consider a sleep study if your symptoms persist or worsen. Let me know if you change your mind  GENERAL HEALTH MAINTENANCE:  Let me know when you receive the Prevnar 20 vaccine.

## 2024-03-07 NOTE — Progress Notes (Signed)
 Phone [phone]   Subjective:  Patient presents today for their annual physical. Chief complaint-noted.   See problem oriented charting- ROS- full  review of systems was completed and negative Per full ROS sheet completed by patient except for topics noted under acute/chronic concerns  The following were reviewed and entered/updated in epic: Past Medical History:  Diagnosis Date   Anemia    Asthma    Bradycardia    Chicken pox    Dysrhythmia    occ. palpitations   Fibromyalgia    Headache(784.0)    Migraines    Neck pain    bulging disk   Polyarthralgia 02/21/2019   PONV (postoperative nausea and vomiting)    sometimes wakes up after surgery and cannot breath due to asthma, can be bradycardic   Rheumatoid arthritis involving multiple sites with positive rheumatoid factor (HCC) 02/21/2019   Shortness of breath    with bradycardia   Patient Active Problem List   Diagnosis Date Noted   Chronic pain 03/16/2023    Priority: High   Immunosuppressed status (HCC) 12/26/2019    Priority: High   Rheumatoid arthritis involving multiple sites with positive rheumatoid factor (HCC) 02/21/2019    Priority: High   Fibromyalgia 04/12/2018    Priority: High   Hyperlipidemia 12/25/2019    Priority: Medium    Vaginal dryness 04/12/2018    Priority: Medium    Morbid obesity (HCC) 04/12/2018    Priority: Medium    History of colon polyps 04/12/2018    Priority: Medium    Primary osteoarthritis of both knees 12/28/2017    Priority: Medium    Mild intermittent asthma 11/20/2013    Priority: Medium    Bradycardia 07/04/2012    Priority: Medium    Vitamin B12 deficiency 02/14/2019    Priority: Low   Vitamin D  deficiency 04/12/2018    Priority: Low   History of total hysterectomy 04/12/2018    Priority: Low   Insomnia 04/12/2018    Priority: Low   Cervicogenic headache 03/26/2014    Priority: Low   Cervical radiculopathy 03/26/2014    Priority: Low   Lumbar radiculopathy  03/16/2023   Asthma 03/07/2022   Urge incontinence 12/20/2018   Urinary frequency 12/20/2018   Past Surgical History:  Procedure Laterality Date   ABDOMINAL HYSTERECTOMY     back procedure     nerve ablation to lower back 05/11/23, 1011/24   BACK SURGERY     cervical fusion C5,6,7 with a bulge at [facility]   BLADDER SURGERY  July 10th 2013   CESAREAN SECTION     1992 and 1995   CHOLECYSTECTOMY N/A 10/07/2021   Procedure: LAPAROSCOPIC CHOLECYSTECTOMY;  Surgeon: Vernetta Berg, MD;  Location: [facility];  Service: General;  Laterality: N/A;   COLONOSCOPY  11/27/2015   Dr.Pyrtle   CYSTOSCOPY N/A 07/31/2018   Procedure: PHYLLIS;  Surgeon: Gaston Hamilton, MD;  Location: [facility];  Service: Urology;  Laterality: N/A;   DIAGNOSTIC LAPAROSCOPY     EXCISION OF MESH N/A 07/31/2018   Procedure: REMOVAL OF VAGINAL MESH;  Surgeon: Gaston Hamilton, MD;  Location: [facility];  Service: Urology;  Laterality: N/A;   LAPAROSCOPY Bilateral 05/31/2013   Procedure: LAPAROSCOPY withbilateral salpingo-oophorectomy, lysis of adhesions;  Surgeon: Lynwood FORBES Curlene PONCE, MD;  Location: [facility];  Service: Gynecology;  Laterality: Bilateral;  with clippings of Vaginal  mesh   SALPINGOOPHORECTOMY Bilateral 05/31/2013   Procedure: BILATERAL SALPINGO OOPHORECTOMY/TRIM MESH;  Surgeon: Lynwood FORBES Curlene PONCE, MD;  Location: [facility];  Service: Gynecology;  Laterality: Bilateral;   TUBAL LIGATION     1995   VAGINAL HYSTERECTOMY     1998 or 1997   WRIST FRACTURE SURGERY  2005    Family History  Problem Relation Age of Onset   Colon polyps Mother    Hyperlipidemia Mother        60 in 2019   Arthritis Mother    Osteoporosis Mother    Lumbar disc disease Mother        herniated discs.    COPD Mother    Hypertension Mother    Hypertension Father        doesnt know his history well- no recent contact   Dementia Father        lewy body.   Other Sister        doesnt know history well   Lumbar disc  disease Brother        grew up with him    Ulcers Brother        esophagus   GER disease Brother    Other Brother        doesnt know history well   Heart disease Maternal Grandmother    Diabetes Maternal Grandmother    Cervical cancer Maternal Grandmother    Colon cancer Maternal Grandfather    Heart disease Maternal Grandfather    Cancer - Colon Maternal Grandfather    Diabetes Paternal Grandmother    Lung cancer Paternal Grandmother        dad's parents stepped in to help when dad not present   Atrial fibrillation Paternal Grandfather        required a few shocks   Bradycardia Paternal Grandfather    Hypertension Paternal Grandfather    Multiple sclerosis Maternal Aunt    Alzheimer's disease Maternal Aunt    Esophageal cancer Neg Hx    Stomach cancer Neg Hx    Rectal cancer Neg Hx     Medications- reviewed and updated Current Outpatient Medications  Medication Sig Dispense Refill   Abatacept  (ORENCIA  IV) Inject into the vein.     albuterol  (VENTOLIN  HFA) 108 (90 Base) MCG/ACT inhaler Inhale 2 puffs into the lungs every 6 (six) hours as needed for wheezing or shortness of breath. 6.7 g 3   Calcium Carbonate (CALTRATE 600 PO) Take by mouth.     Cholecalciferol  (VITAMIN D3) 125 MCG (5000 UT) CAPS Take by mouth daily.     cyanocobalamin  1000 MCG tablet Take 1,000 mcg by mouth daily.     Estradiol  10 MCG TABS vaginal tablet Place 1 tablet (10 mcg total) vaginally 2 (two) times a week. 24 tablet 3   folic acid  (FOLVITE ) 1 MG tablet Take 1 tablet (1 mg total) by mouth daily. 90 tablet 3   gabapentin  (NEURONTIN ) 300 MG capsule Take 1 capsule by mouth each morning and 1 capsule each afternoon and 2 capsules before bed as directed 360 capsule 3   hydrochlorothiazide  (HYDRODIURIL ) 25 MG tablet Take 1 tablet (25 mg total) by mouth daily as needed for fluid retention/edema. 90 tablet 1   ibuprofen  (ADVIL ) 800 MG tablet Take 1 tablet (800 mg total) by mouth every 8 (eight) hours as needed.  30 tablet 0   leflunomide  (ARAVA ) 20 MG tablet Take 1 tablet (20 mg total) by mouth daily. 90 tablet 3   levocetirizine (XYZAL ) 5 MG tablet Take 1 tablet (5 mg total) by mouth every evening. 90 tablet 1   melatonin 5 MG TABS  ondansetron  (ZOFRAN -ODT) 4 MG disintegrating tablet Take 1 tablet (4 mg total) by mouth every 8 (eight) hours as needed for nausea or vomiting. 20 tablet 0   traMADol  (ULTRAM ) 50 MG tablet Take 1-2 tablets (50-100 mg total) by mouth every 4-6 hours as needed for moderate pain (max of 8 per day) 180 tablet 1   Turmeric 500 MG CAPS Take by mouth 2 (two) times daily.     valACYclovir  (VALTREX ) 1000 MG tablet Take 1 tablet (1,000 mg total) by mouth 2 (two) times daily. 30 tablet 0   vitamin C (ASCORBIC ACID) 500 MG tablet Take 500 mg by mouth as needed.     No current facility-administered medications for this visit.    Allergies-reviewed and updated Allergies  Allergen Reactions   Advair Diskus [Fluticasone-Salmeterol] Cough    excessive   Erythromycin Diarrhea and Nausea And Vomiting   Percocet [Oxycodone-Acetaminophen ] Other (See Comments)    hallucinations    Social History   Social History Narrative   Married to husband Alm. Son Medford and Daughter Kaylan.       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       Hobbies: movies, reading, hiking   Right handed   Objective  Objective:  BP 124/72 (BP Location: Right Arm, Patient Position: Sitting)   Pulse 67   Temp (!) 97 F (36.1 C) (Temporal)   Ht 5' 1 (1.549 m)   Wt 234 lb (106.1 kg)   SpO2 97%   BMI 44.21 kg/m  Gen: NAD, resting comfortably HEENT: Mucous membranes are moist. Oropharynx normal Neck: no thyromegaly CV: RRR no murmurs rubs or gallops Lungs: CTAB no crackles, wheeze, rhonchi Abdomen: soft/nontender/nondistended/normal bowel sounds. No rebound or guarding.  Ext: trace to 1+ edema Skin: warm, dry Neuro: grossly normal, moves all extremities, PERRLA   Assessment and Plan    58 y.o.  female presenting for annual physical.  Health Maintenance counseling: 1. Anticipatory guidance: Patient counseled regarding regular dental exams -q6 months, eye exams - yearly,  avoiding smoking and second hand smoke , limiting alcohol to 1 beverage per day- maybe twice a year , no illicit drugs .   2. Risk factor reduction:  Advised patient of need for regular exercise and diet rich and fruits and vegetables to reduce risk of heart attack and stroke.  Exercise- has been limited by orthopedic and rheumatological concerns- hoping to improve. Aquatic aerobics coming up end of septmeber Diet/weight management-got as low as 225 before steroid injections- trying to maintain at least- and hoping in long run to lower - she feels she knows what do do and plans to execute.  Wt Readings from Last 3 Encounters:  03/07/24 234 lb (106.1 kg)  01/11/24 228 lb (103.4 kg)  01/11/24 228 lb (103.4 kg)   3. Immunizations/screenings/ancillary studies- fall flu shot planned. Opts out of COVID. Opts out shingrix for now. Plans to get Prevnar 20 as going to [place]  Immunization History  Administered Date(s) Administered   Influenza Split 06/01/2023   Influenza,inj,Quad PF,6+ Mos 04/24/2018, 05/24/2022   Influenza-Unspecified 05/02/2015, 05/04/2016, 05/08/2017, 05/03/2019, 05/22/2020, 05/07/2021   PFIZER(Purple Top)SARS-COV-2 Vaccination 07/24/2019, 08/12/2019, 08/08/2020   Tdap 08/26/2015  4. Cervical cancer screening- hysterectomy for benign reasons- was told dental infection dnot require further paps but could see [facility] if needed.  5. Breast cancer screening-  breast exam - prefers self exams0 and mammogram - 3 days mammogram and will get done next month- does yearly- April 06 2023 6. Colon cancer  screening - History of sessile serrated polyp without dysplasia-5-year repeat needed after March 14 2022 colonoscopy  7. Skin cancer screening- [facility]- had one lesion removed but  benign. advised regular sunscreen use. Denies worrisome, changing, or new skin lesions.  8. Birth control/STD check- hysterectomy and monogmous 9. Osteoporosis screening at 40- Osteopenia worst T-score -1.4 right femoral neck July 2025-repeat 2 to 3 years 10. Smoking associated screening - never smoker  Status of chronic or acute concerns   HPI Discussed the use of AI scribe software for clinical note transcription with the patient, who gave verbal consent to proceed.  History of Present Illness   Nancy [name] is a 58 year old female with rheumatoid arthritis who presents for follow-up of her condition.  Rheumatoid arthritis  -with current treatment regimen of leflunomide  and Orencia  IV resulting in 70-80% improvement in symptoms - Previous therapies included methotrexate , Humira , Rinvoq , and Orencia  subcutaneous, which were less effective - Sedimentation rate improved to 29  Degenerative joint disease - Osteoarthritis primarily affecting the knees - Knee pain well controlled since steroid injection in June 2025 - Uses tramadol  for pain management - History of gel injections for knees  Lumbar radiculopathy and spondylolisthesis - History of spondylolisthesis and lumbar disc herniations at L1-L2 and L4-L5 with facet arthropathy - Multiple epidural and facet joint injections have resolved leg and hip pain - Persistent low back pain unfortunately  Glucose intolerance - Prediabetes with most recent hemoglobin A1c of 6.3 up from 6.1 - A1c elevation attributed to recent steroid injections - Weight fluctuating with steroid use but encourage long-term weight loss  Sleep disturbance - Frequent nocturnal awakenings and daytime fatigue - Mild snoring - No sleep study performed in the past  Dyslipidemia - Elevated LDL cholesterol at 152 mg/dL - Elevated HDL cholesterol that calories with-unable to calculate ASCVD risk  Osteopenia - T-score of -1.4 at right femoral neck, stable  from previous measurements  Resolved eosinophilia and vitamin b12 supplementation - History of eosinophilia, now resolved  Vaginal dryness - Uses estradiol  vaginal tablets for vaginal dryness  Edema - Uses hydrochlorothiazide  as needed for edema - No history of hypertension   -Has found this very helpful    AP Assessment and Plan    Rheumatoid arthritis Well-controlled with leflunomide  and IV abatacept , showing 70-80% improvement and improved sedimentation rate. - Continue leflunomide  and IV abatacept .  Osteoarthritis of knees Managed with tramadol  and steroid injections. Recent injection effectively reduced knee pain. - Continue tramadol  as needed for pain. - Consider future steroid injections as needed.  Lumbar spondylolisthesis and degenerative disc disease with chronic low back pain Chronic low back pain persists despite epidural injections alleviating leg and hip pain. Facet joint issues may contribute to ongoing pain.  Chronic pain Managed with gabapentin  and tramadol . Reduced gabapentin  dose without worsening pain suggests potential for further dose reduction. - Continue current gabapentin  dose. - Consider further dose reduction if pain remains controlled.  Prediabetes A1c increased to 6.3, likely due to recent steroid injections. She plans to focus on diet and exercise. - Encourage dietary modifications and increased physical activity.  Hyperlipidemia Significantly elevated LDL and HDL levels. ASCVD risk calculation not possible due to elevated HDL.  Osteopenia, right femoral neck T-score of -1.4 with no significant change from previous assessment. - Repeat DEXA scan in 2-3 years.  Vitamin D  deficiency Levels are replete with ongoing supplementation. - Continue vitamin D  supplementation.  Edema and chronic venous insufficiency, lower extremities Well-managed with intermittent hydrochlorothiazide  and  compression stockings. Symptoms have improved. - Continue  hydrochlorothiazide  as needed. - Continue regular use of compression stockings.  Postmenopausal atrophic vaginitis Managed with vaginal estradiol . Risks of hormone therapy discussed, with lower risks associated with vaginal delivery. - Continue vaginal estradiol .  Sleep disturbance, possible sleep apnea Reports snoring and non-refreshing sleep. Elevated CO2 levels suggest possible sleep apnea. She prefers to hold off on sleep study. - Consider sleep study if symptoms persist or worsen.  Family history of dementia and multiple sclerosis Includes Lewy body dementia in father, multiple sclerosis in maternal aunt, and Alzheimer's in another maternal aunt. No immediate genetic testing planned.  Contains text generated by [employer].   Recommended follow up: Return in about 1 year (around 03/07/2025) for physical or sooner if needed.Schedule b4 you leave. Future Appointments  Date Time Provider Department Center  04/11/2024  3:30 PM Ziemba, Mary F, PT [facility]    Lab/Order associations:   ICD-10-CM   1. Preventative health care  Z00.00       No orders of the defined types were placed in this encounter.   Return precautions advised.  Garnette Lukes, MD

## 2024-03-08 ENCOUNTER — Encounter: Payer: Commercial Managed Care - PPO | Admitting: Family Medicine

## 2024-03-14 ENCOUNTER — Encounter: Payer: Commercial Managed Care - PPO | Admitting: Family Medicine

## 2024-03-21 DIAGNOSIS — M0579 Rheumatoid arthritis with rheumatoid factor of multiple sites without organ or systems involvement: Secondary | ICD-10-CM | POA: Diagnosis not present

## 2024-03-21 DIAGNOSIS — M1990 Unspecified osteoarthritis, unspecified site: Secondary | ICD-10-CM | POA: Diagnosis not present

## 2024-03-21 DIAGNOSIS — M797 Fibromyalgia: Secondary | ICD-10-CM | POA: Diagnosis not present

## 2024-03-21 DIAGNOSIS — Z79899 Other long term (current) drug therapy: Secondary | ICD-10-CM | POA: Diagnosis not present

## 2024-03-26 ENCOUNTER — Other Ambulatory Visit (HOSPITAL_COMMUNITY): Payer: Self-pay

## 2024-04-04 DIAGNOSIS — M0579 Rheumatoid arthritis with rheumatoid factor of multiple sites without organ or systems involvement: Secondary | ICD-10-CM | POA: Diagnosis not present

## 2024-04-04 DIAGNOSIS — H5203 Hypermetropia, bilateral: Secondary | ICD-10-CM | POA: Diagnosis not present

## 2024-04-10 NOTE — Therapy (Signed)
 OUTPATIENT PHYSICAL THERAPY THORACOLUMBAR EVALUATION   Patient Name: Nancy [name] MRN: [id number] DOB:08/28/1965, 58 y.o., female Today's Date: 04/11/2024  END OF SESSION:  PT End of Session - 04/11/24 1631     Visit Number 1    Date for PT Re-Evaluation 06/07/24    Authorization Type aetna    PT Start Time 1540    PT Stop Time 1625    PT Time Calculation (min) 45 min    Activity Tolerance Patient tolerated treatment well    Behavior During Therapy WFL for tasks assessed/performed          Past Medical History:  Diagnosis Date   Anemia    Asthma    Bradycardia    Chicken pox    Dysrhythmia    occ. palpitations   Fibromyalgia    Headache(784.0)    Migraines    Neck pain    bulging disk   Polyarthralgia 02/21/2019   PONV (postoperative nausea and vomiting)    sometimes wakes up after surgery and cannot breath due to asthma, can be bradycardic   Rheumatoid arthritis involving multiple sites with positive rheumatoid factor (HCC) 02/21/2019   Shortness of breath    with bradycardia   Past Surgical History:  Procedure Laterality Date   ABDOMINAL HYSTERECTOMY     back procedure     nerve ablation to lower back 05/11/23, 1011/24   BACK SURGERY     cervical fusion C5,6,7 with a bulge at [facility]   BLADDER SURGERY  July 10th 2013   CESAREAN SECTION     1992 and 1995   CHOLECYSTECTOMY N/A 10/07/2021   Procedure: LAPAROSCOPIC CHOLECYSTECTOMY;  Surgeon: Vernetta Berg, MD;  Location: [facility];  Service: General;  Laterality: N/A;   COLONOSCOPY  11/27/2015   Dr.Pyrtle   CYSTOSCOPY N/A 07/31/2018   Procedure: PHYLLIS;  Surgeon: Gaston Hamilton, MD;  Location: [facility];  Service: Urology;  Laterality: N/A;   DIAGNOSTIC LAPAROSCOPY     EXCISION OF MESH N/A 07/31/2018   Procedure: REMOVAL OF VAGINAL MESH;  Surgeon: Gaston Hamilton, MD;  Location: [facility];  Service: Urology;  Laterality: N/A;   LAPAROSCOPY Bilateral 05/31/2013   Procedure:  LAPAROSCOPY withbilateral salpingo-oophorectomy, lysis of adhesions;  Surgeon: Lynwood FORBES Curlene PONCE, MD;  Location: [facility];  Service: Gynecology;  Laterality: Bilateral;  with clippings of Vaginal  mesh   SALPINGOOPHORECTOMY Bilateral 05/31/2013   Procedure: BILATERAL SALPINGO OOPHORECTOMY/TRIM MESH;  Surgeon: Lynwood FORBES Curlene PONCE, MD;  Location: [facility];  Service: Gynecology;  Laterality: Bilateral;   TUBAL LIGATION     1995   VAGINAL HYSTERECTOMY     1998 or 1997   WRIST FRACTURE SURGERY  2005   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   Vitamin B12 deficiency 02/14/2019   Urge incontinence 12/20/2018   Urinary frequency 12/20/2018   Fibromyalgia 04/12/2018   Vitamin D  deficiency 04/12/2018   Vaginal dryness 04/12/2018   History of total hysterectomy 04/12/2018   Insomnia 04/12/2018   Morbid obesity (HCC) 04/12/2018   History of colon polyps 04/12/2018   Primary osteoarthritis of both knees 12/28/2017   Cervicogenic headache 03/26/2014   Cervical radiculopathy 03/26/2014   Mild intermittent asthma 11/20/2013   Bradycardia 07/04/2012    PCP: Katrinka Garnette KIDD,  MD   REFERRING PROVIDER: Katrinka Garnette KIDD, MD   REFERRING DIAG:  M54.16 (ICD-10-CM) - Lumbar radiculopathy  M79.7 (ICD-10-CM) - Fibromyalgia  M17.0 (ICD-10-CM) - Primary osteoarthritis of both knees    Rationale for Evaluation and Treatment: Rehabilitation  THERAPY DIAG:  Other low back pain  Chronic pain of both knees  Muscle weakness (generalized)  ONSET DATE: exacerbated last year  SUBJECTIVE:                                                                                                                                                                                            SUBJECTIVE STATEMENT: Chronic LBP had epidural early July then facets done a few weeks later.  Epidural knocked out LB and left hip pain and the radiating pain down legs.  Knees oa with steroid injections last in June with good response. Due soon but trying to extend it to wait.  I am getting steroids injection every few months.  Do have some pain on lateral side of left foot. Prediabetic.  PERTINENT HISTORY:  RA; OA; fibromyalgia (x 10 yrs ago)  PAIN:  Are you having pain? Yes: NPRS scale: 3-4/10 normal Pain location: knees and LB Pain description: constant and variable achy LB; sharp knees Aggravating factors: house work Relieving factors: resting; ice/heat; tramadol ; Ibuprofen   PRECAUTIONS: None  RED FLAGS: None   WEIGHT BEARING RESTRICTIONS: No  FALLS:  Has patient fallen in last 6 months? Yes. 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  DIAGNOSTIC FINDINGS:  MRI Lumbar IMPRESSION: 1. L1-2: Development of a central to right-sided disc herniation that indents the thecal sac. Definite neural compression is not demonstrated, but this could be a cause of back pain or lumbar nerve root irritation. New since 2023. 2. L3-4: Shallow left posterolateral disc herniation with stenosis of the left lateral recess that could possibly be symptomatic. This has worsened since 2023. 3. L4-5: Bilateral facet arthropathy with anterolisthesis of 3 mm, worsened since 2023. 4 x 5 mm synovial cyst associated with the left facet joint projecting in the spinal canal.  Stenosis of both lateral recesses that could possibly be symptomatic. This has worsened since 2023. 4. L5-S1: Disc bulge and facet osteoarthritis. No compressive stenosis.  PATIENT SURVEYS:  LEFS 32/80  COGNITION: Overall cognitive status: Within functional limits for tasks assessed     SENSATION: Numbness left last 3 digit and calf  MUSCLE LENGTH: Hamstrings: wfl tested in sitting   POSTURE: rounded shoulders, forward head, decreased lumbar lordosis, and bilateral valgus deformities knee R>L  PALPATION: TTP throughout lower thoracic and lumbar paraspinals  LUMBAR ROM:   AROM eval  Flexion full  Extension neutral  Right lateral flexion Limited 75% P!  Left lateral flexion Limited 75% P!  Right rotation   Left rotation    (Blank rows = not tested)  LOWER EXTREMITY ROM:     WFL  LOWER EXTREMITY MMT:    HD (lb) Tested in sitting Right eval Left eval  Hip flexion 31.7 34.1  Hip extension    Hip abduction 22.2 18.2  Hip adduction    Hip internal rotation    Hip external rotation    Knee flexion    Knee extension 14.3 11.8  Ankle dorsiflexion    Ankle plantarflexion    Ankle inversion    Ankle eversion     (Blank rows = not tested)  LUMBAR SPECIAL TESTS:  Slump test: Negative  FUNCTIONAL TESTS:  5 times sit to stand: 15.14 from pool bench Timed up and go (TUG): 16.11   4 stage balance: Passed 1&2. Tandem: needs assist gaining position holding x 4s; SLS unable GAIT: Distance walked: 400 ft Assistive device utilized: None Level of assistance: Complete Independence Comments: right knee valgus deformity causes hip circumduction to advance rle during swing.  Initial gait antalgic, reduces with distance  TREATMENT  Eval Self care:Posture and Optometrist instruction                                                                                                                                 PATIENT EDUCATION:  Education details: Discussed eval  findings, rehab rationale, aquatic program progression/POC and pools in area. Patient is in agreement  Person educated: Patient Education method: Explanation Education comprehension: verbalized understanding  HOME EXERCISE PROGRAM: Aquatic to be assigned  ASSESSMENT:  CLINICAL IMPRESSION: Patient is a 58 y.o. f who was seen today for physical therapy evaluation and treatment for fibromyalgia, LBP and bilateral knee pain. She presents with pain limited deficits in  knees, Lb and left foot due to above dx which effect her strength, endurance, activity tolerance, gait, balance, and functional mobility. She has steroid shots routinely in Lumbar spin and knees which have been effective in her pain management up to this point.  Her goals are to improve her strength and learn to manage her pain. Patient will benefit from skilled PT intervention and will be seen in both aquatic and land based clinic to optimize her outcomes with focus on improving all deficits.   OBJECTIVE IMPAIRMENTS: Abnormal gait, decreased activity tolerance, decreased balance, decreased mobility, decreased strength, obesity, and pain.   ACTIVITY LIMITATIONS: carrying, lifting, bending,  squatting, stairs, and transfers  PARTICIPATION LIMITATIONS: cleaning, shopping, and community activity  PERSONAL FACTORS: Time since onset of injury/illness/exacerbation are also affecting patient's functional outcome.   REHAB POTENTIAL: Good  CLINICAL DECISION MAKING: Stable/uncomplicated  EVALUATION COMPLEXITY: Low   GOALS: Goals reviewed with patient? Yes  SHORT TERM GOALS: Target date: 05/15/24  Pt will tolerate full aquatic sessions consistently without increase in pain and with improving function to demonstrate good toleration and effectiveness of intervention.  Baseline: Goal status: INITIAL  2.  Pt will consider gaining pool access for use of the properties of water  for chronic conditions maintaining mobility and minimizing  pain.  Baseline:  Goal status: INITIAL    LONG TERM GOALS: Target date: 06/07/24  Pt to improve on LEFS by at least 9 point to demonstrate statistically significant Improvement in function. Baseline: 32/80 Goal status: INITIAL  2.  Pt will improve on Tug test to <or= 13s (13.5 avg)  to demonstrate improvement in lower extremity function, mobility and decreased fall risk. Baseline: 16.11 Goal status: INITIAL  3.  Pt will report decrease in pain by at least 50% for improved toleration to activity/quality of life and to demonstrate improved management of pain. Baseline:  Goal status: INITIAL  4.  Pt will improve strength in knee by 5 lbs  to demonstrate improved overall physical function Baseline: see chart Goal status: INITIAL    PLAN:  PT FREQUENCY: 2x/week  PT DURATION: 8 weeks 12 visits likely alternating land and water   PLANNED INTERVENTIONS: 97164- PT Re-evaluation, 97750- Physical Performance Testing, 97110-Therapeutic exercises, 97530- Therapeutic activity, [id number]- Neuromuscular re-education, 97535- Self Care, 02859- Manual therapy, [id number]- Gait training, [phone]- Orthotic Initial, [phone]- Aquatic Therapy, [phone]- Ionotophoresis 4mg /ml Dexamethasone , 79439 (1-2 muscles), [id number] (3+ muscles)- Dry Needling, Patient/Family education, Balance training, Stair training, Taping, Joint mobilization, DME instructions, Cryotherapy, and Moist heat.  PLAN FOR NEXT SESSION: Aquatic: core/hip/le strengthening and stretching; pain management  Land: same   Ronal Foots) Kristene Liberati MPT 04/11/24 4:47 PM [facility] [address] [place], [place], [address] Phone: [phone]   Fax:  [phone]

## 2024-04-11 ENCOUNTER — Other Ambulatory Visit: Payer: Self-pay

## 2024-04-11 ENCOUNTER — Other Ambulatory Visit (HOSPITAL_COMMUNITY): Payer: Self-pay

## 2024-04-11 ENCOUNTER — Ambulatory Visit (HOSPITAL_BASED_OUTPATIENT_CLINIC_OR_DEPARTMENT_OTHER): Attending: Family Medicine | Admitting: Physical Therapy

## 2024-04-11 ENCOUNTER — Encounter (HOSPITAL_BASED_OUTPATIENT_CLINIC_OR_DEPARTMENT_OTHER): Payer: Self-pay | Admitting: Physical Therapy

## 2024-04-11 DIAGNOSIS — M5416 Radiculopathy, lumbar region: Secondary | ICD-10-CM | POA: Diagnosis not present

## 2024-04-11 DIAGNOSIS — G8929 Other chronic pain: Secondary | ICD-10-CM | POA: Diagnosis not present

## 2024-04-11 DIAGNOSIS — M25561 Pain in right knee: Secondary | ICD-10-CM | POA: Diagnosis not present

## 2024-04-11 DIAGNOSIS — M797 Fibromyalgia: Secondary | ICD-10-CM | POA: Diagnosis not present

## 2024-04-11 DIAGNOSIS — M25562 Pain in left knee: Secondary | ICD-10-CM | POA: Diagnosis not present

## 2024-04-11 DIAGNOSIS — M170 Bilateral primary osteoarthritis of knee: Secondary | ICD-10-CM | POA: Insufficient documentation

## 2024-04-11 DIAGNOSIS — M5459 Other low back pain: Secondary | ICD-10-CM | POA: Diagnosis not present

## 2024-04-11 DIAGNOSIS — M6281 Muscle weakness (generalized): Secondary | ICD-10-CM | POA: Insufficient documentation

## 2024-04-18 ENCOUNTER — Ambulatory Visit (HOSPITAL_BASED_OUTPATIENT_CLINIC_OR_DEPARTMENT_OTHER): Admitting: Physical Therapy

## 2024-04-23 ENCOUNTER — Encounter (HOSPITAL_BASED_OUTPATIENT_CLINIC_OR_DEPARTMENT_OTHER): Payer: Self-pay | Admitting: Physical Therapy

## 2024-04-23 ENCOUNTER — Ambulatory Visit (HOSPITAL_BASED_OUTPATIENT_CLINIC_OR_DEPARTMENT_OTHER): Admitting: Physical Therapy

## 2024-04-23 DIAGNOSIS — M25561 Pain in right knee: Secondary | ICD-10-CM | POA: Diagnosis not present

## 2024-04-23 DIAGNOSIS — M25562 Pain in left knee: Secondary | ICD-10-CM | POA: Diagnosis not present

## 2024-04-23 DIAGNOSIS — G8929 Other chronic pain: Secondary | ICD-10-CM | POA: Diagnosis not present

## 2024-04-23 DIAGNOSIS — M6281 Muscle weakness (generalized): Secondary | ICD-10-CM | POA: Diagnosis not present

## 2024-04-23 DIAGNOSIS — M5416 Radiculopathy, lumbar region: Secondary | ICD-10-CM | POA: Diagnosis not present

## 2024-04-23 DIAGNOSIS — M5459 Other low back pain: Secondary | ICD-10-CM | POA: Diagnosis not present

## 2024-04-23 DIAGNOSIS — M797 Fibromyalgia: Secondary | ICD-10-CM | POA: Diagnosis not present

## 2024-04-23 DIAGNOSIS — M170 Bilateral primary osteoarthritis of knee: Secondary | ICD-10-CM | POA: Diagnosis not present

## 2024-04-23 NOTE — Therapy (Signed)
 OUTPATIENT PHYSICAL THERAPY THORACOLUMBAR TREATMENT    Patient Name: Nancy [name] MRN: [id number] DOB:May 12, 1966, 58 y.o., female Today's Date: 04/23/2024  END OF SESSION:  PT End of Session - 04/23/24 1409     Visit Number 2    Date for Recertification  06/07/24    Authorization Type aetna    PT Start Time 1346    PT Stop Time 1425    PT Time Calculation (min) 39 min    Activity Tolerance Patient tolerated treatment well    Behavior During Therapy WFL for tasks assessed/performed           Past Medical History:  Diagnosis Date   Anemia    Asthma    Bradycardia    Chicken pox    Dysrhythmia    occ. palpitations   Fibromyalgia    Headache(784.0)    Migraines    Neck pain    bulging disk   Polyarthralgia 02/21/2019   PONV (postoperative nausea and vomiting)    sometimes wakes up after surgery and cannot breath due to asthma, can be bradycardic   Rheumatoid arthritis involving multiple sites with positive rheumatoid factor (HCC) 02/21/2019   Shortness of breath    with bradycardia   Past Surgical History:  Procedure Laterality Date   ABDOMINAL HYSTERECTOMY     back procedure     nerve ablation to lower back 05/11/23, 1011/24   BACK SURGERY     cervical fusion C5,6,7 with a bulge at [facility]   BLADDER SURGERY  July 10th 2013   CESAREAN SECTION     1992 and 1995   CHOLECYSTECTOMY N/A 10/07/2021   Procedure: LAPAROSCOPIC CHOLECYSTECTOMY;  Surgeon: Vernetta Berg, MD;  Location: [facility];  Service: General;  Laterality: N/A;   COLONOSCOPY  11/27/2015   Dr.Pyrtle   CYSTOSCOPY N/A 07/31/2018   Procedure: PHYLLIS;  Surgeon: Gaston Hamilton, MD;  Location: [facility];  Service: Urology;  Laterality: N/A;   DIAGNOSTIC LAPAROSCOPY     EXCISION OF MESH N/A 07/31/2018   Procedure: REMOVAL OF VAGINAL MESH;  Surgeon: Gaston Hamilton, MD;  Location: [facility];  Service: Urology;  Laterality: N/A;   LAPAROSCOPY Bilateral 05/31/2013   Procedure:  LAPAROSCOPY withbilateral salpingo-oophorectomy, lysis of adhesions;  Surgeon: Lynwood FORBES Curlene PONCE, MD;  Location: [facility];  Service: Gynecology;  Laterality: Bilateral;  with clippings of Vaginal  mesh   SALPINGOOPHORECTOMY Bilateral 05/31/2013   Procedure: BILATERAL SALPINGO OOPHORECTOMY/TRIM MESH;  Surgeon: Lynwood FORBES Curlene PONCE, MD;  Location: [facility];  Service: Gynecology;  Laterality: Bilateral;   TUBAL LIGATION     1995   VAGINAL HYSTERECTOMY     1998 or 1997   WRIST FRACTURE SURGERY  2005   Patient Active Problem List   Diagnosis Date Noted   Lumbar radiculopathy 03/16/2023   Chronic pain 03/16/2023   Asthma 03/07/2022   Immunosuppressed status 12/26/2019   Hyperlipidemia 12/25/2019   Rheumatoid arthritis involving multiple sites with positive rheumatoid factor (HCC) 02/21/2019   Vitamin B12 deficiency 02/14/2019   Urge incontinence 12/20/2018   Urinary frequency 12/20/2018   Fibromyalgia 04/12/2018   Vitamin D  deficiency 04/12/2018   Vaginal dryness 04/12/2018   History of total hysterectomy 04/12/2018   Insomnia 04/12/2018   Morbid obesity (HCC) 04/12/2018   History of colon polyps 04/12/2018   Primary osteoarthritis of both knees 12/28/2017   Cervicogenic headache 03/26/2014   Cervical radiculopathy 03/26/2014   Mild intermittent asthma 11/20/2013   Bradycardia 07/04/2012    PCP: Katrinka Senior  MALVA, MD   REFERRING PROVIDER: Katrinka Garnette MALVA, MD   REFERRING DIAG:  M54.16 (ICD-10-CM) - Lumbar radiculopathy  M79.7 (ICD-10-CM) - Fibromyalgia  M17.0 (ICD-10-CM) - Primary osteoarthritis of both knees    Rationale for Evaluation and Treatment: Rehabilitation  THERAPY DIAG:  Other low back pain  Chronic pain of both knees  Muscle weakness (generalized)  ONSET DATE: exacerbated last year  SUBJECTIVE:                                                                                                                                                                                            SUBJECTIVE STATEMENT:   Nothing's really new, having burning sensation into right thigh. Having another injection this coming Thursday. Have been doing a little at the gym but not too much, at eval she said to let PT do what we can do.      Eval: Chronic LBP had epidural early July then facets done a few weeks later.  Epidural knocked out LB and left hip pain and the radiating pain down legs.  Knees oa with steroid injections last in June with good response. Due soon but trying to extend it to wait.  I am getting steroids injection every few months.  Do have some pain on lateral side of left foot. Prediabetic.  PERTINENT HISTORY:  RA; OA; fibromyalgia (x 10 yrs ago)  PAIN:  Are you having pain? Yes: NPRS scale: 4-5/10 normal Pain location: B knees and LB Pain description: constant and variable achy LB; sharp knees, burning in R thigh  Aggravating factors: house work, being up on it  Relieving factors: resting; ice/heat; tramadol ; Ibuprofen   PRECAUTIONS: None  RED FLAGS: None   WEIGHT BEARING RESTRICTIONS: No  FALLS:  Has patient fallen in last 6 months? Yes. 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  DIAGNOSTIC FINDINGS:  MRI Lumbar IMPRESSION: 1. L1-2: Development of a central to right-sided disc herniation that indents the thecal sac. Definite neural compression is not demonstrated, but this could be a cause of back pain or lumbar nerve root irritation. New since 2023. 2. L3-4: Shallow left posterolateral disc herniation with stenosis of the left  lateral recess that could possibly be symptomatic. This has  worsened since 2023. 3. L4-5: Bilateral facet arthropathy with anterolisthesis of 3 mm, worsened since 2023. 4 x 5 mm synovial cyst associated with the left facet joint projecting in the spinal canal. Stenosis of both lateral recesses that could possibly be symptomatic. This has worsened since 2023. 4. L5-S1: Disc bulge and facet osteoarthritis. No compressive stenosis.  PATIENT SURVEYS:  LEFS 32/80  COGNITION: Overall cognitive status: Within functional limits for tasks assessed     SENSATION: Numbness left last 3 digit and calf  MUSCLE LENGTH: Hamstrings: wfl tested in sitting   POSTURE: rounded shoulders, forward head, decreased lumbar lordosis, and bilateral valgus deformities knee R>L  PALPATION: TTP throughout lower thoracic and lumbar paraspinals  LUMBAR ROM:   AROM eval  Flexion full  Extension neutral  Right lateral flexion Limited 75% P!  Left lateral flexion Limited 75% P!  Right rotation   Left rotation    (Blank rows = not tested)  LOWER EXTREMITY ROM:     WFL  LOWER EXTREMITY MMT:    HD (lb) Tested in sitting Right eval Left eval  Hip flexion 31.7 34.1  Hip extension    Hip abduction 22.2 18.2  Hip adduction    Hip internal rotation    Hip external rotation    Knee flexion    Knee extension 14.3 11.8  Ankle dorsiflexion    Ankle plantarflexion    Ankle inversion    Ankle eversion     (Blank rows = not tested)  LUMBAR SPECIAL TESTS:  Slump test: Negative  FUNCTIONAL TESTS:  5 times sit to stand: 15.14 from pool bench Timed up and go (TUG): 16.11   4 stage balance: Passed 1&2. Tandem: needs assist gaining position holding x 4s; SLS unable GAIT: Distance walked: 400 ft Assistive device utilized: None Level of assistance: Complete Independence Comments: right knee valgus deformity causes hip circumduction to advance rle during swing.  Initial gait antalgic, reduces with  distance  TREATMENT   04/23/24  Nustep L5x6 minutes all four extremities, seat 9 for w/u   Bridges + ABD into red TB x12 Sidelying clams red TB x12 PPT 15x3 second holds Lumbar rotation stretches 10x5 seconds B PPT + march x12  SKTC 5x3 seconds B  Figure 4 stretch 3x30 seconds B supine HS stretches hooklying 2x30 seconds B with sheet  Forward QL stretch with stool 3x30 seconds       Eval Self care:Posture and Optometrist instruction                                                                                                                                 PATIENT EDUCATION:  Education details: Discussed eval findings, rehab rationale, aquatic program progression/POC and pools in area. Patient is in agreement  Person educated: Patient Education method: Explanation Education comprehension: verbalized understanding  HOME EXERCISE PROGRAM:  Aquatic to be assigned   Land HEP:  Access Code: [email or web address] URL: [email or web address]  Date: 04/23/2024 Prepared by: Josette Rough      ASSESSMENT:  CLINICAL IMPRESSION:   Arrives today doing OK, no major changes since eval- we focused on core and hip strengthening as per POC, also assigned initial land HEP today as well.  No reports of increased pain during any exercises, but she did endorse some increased knee pain at EOS.    EVAL: Patient is a 58 y.o. f who was seen today for physical therapy evaluation and treatment for fibromyalgia, LBP and bilateral knee pain. She presents with pain limited deficits in  knees, Lb and left foot due to above dx which effect her strength, endurance, activity tolerance, gait, balance, and functional mobility. She has steroid shots routinely in Lumbar spin and knees which have been effective in her pain management up to this point.  Her goals are to improve her strength and learn to manage her pain. Patient will benefit from skilled PT intervention and will be seen in both  aquatic and land based clinic to optimize her outcomes with focus on improving all deficits.   OBJECTIVE IMPAIRMENTS: Abnormal gait, decreased activity tolerance, decreased balance, decreased mobility, decreased strength, obesity, and pain.   ACTIVITY LIMITATIONS: carrying, lifting, bending, squatting, stairs, and transfers  PARTICIPATION LIMITATIONS: cleaning, shopping, and community activity  PERSONAL FACTORS: Time since onset of injury/illness/exacerbation are also affecting patient's functional outcome.   REHAB POTENTIAL: Good  CLINICAL DECISION MAKING: Stable/uncomplicated  EVALUATION COMPLEXITY: Low   GOALS: Goals reviewed with patient? Yes  SHORT TERM GOALS: Target date: 05/15/24  Pt will tolerate full aquatic sessions consistently without increase in pain and with improving function to demonstrate good toleration and effectiveness of intervention.  Baseline: Goal status: INITIAL  2.  Pt will consider gaining pool access for use of the properties of water  for chronic conditions maintaining mobility and minimizing pain.  Baseline:  Goal status: INITIAL    LONG TERM GOALS: Target date: 06/07/24  Pt to improve on LEFS by at least 9 point to demonstrate statistically significant Improvement in function. Baseline: 32/80 Goal status: INITIAL  2.  Pt will improve on Tug test to <or= 13s (13.5 avg)  to demonstrate improvement in lower extremity function, mobility and decreased fall risk. Baseline: 16.11 Goal status: INITIAL  3.  Pt will report decrease in pain by at least 50% for improved toleration to activity/quality of life and to demonstrate improved management of pain. Baseline:  Goal status: INITIAL  4.  Pt will improve strength in knee by 5 lbs  to demonstrate improved overall physical function Baseline: see chart Goal status: INITIAL    PLAN:  PT FREQUENCY: 2x/week  PT DURATION: 8 weeks 12 visits likely alternating land and water   PLANNED  INTERVENTIONS: 97164- PT Re-evaluation, 97750- Physical Performance Testing, 97110-Therapeutic exercises, 97530- Therapeutic activity, [id number]- Neuromuscular re-education, 97535- Self Care, 02859- Manual therapy, [id number]- Gait training, [phone]- Orthotic Initial, [phone]- Aquatic Therapy, [phone]- Ionotophoresis 4mg /ml Dexamethasone , 79439 (1-2 muscles), [id number] (3+ muscles)- Dry Needling, Patient/Family education, Balance training, Stair training, Taping, Joint mobilization, DME instructions, Cryotherapy, and Moist heat.  PLAN FOR NEXT SESSION: Aquatic: core/hip/le strengthening and stretching; pain management  Land: core/hip strength, conditioning, stretching as appropriate    Josette Rough, PT, DPT 04/23/24 2:26 PM

## 2024-04-24 ENCOUNTER — Ambulatory Visit: Admitting: Family Medicine

## 2024-04-25 ENCOUNTER — Ambulatory Visit: Admitting: Family Medicine

## 2024-04-25 ENCOUNTER — Other Ambulatory Visit: Payer: Self-pay

## 2024-04-25 ENCOUNTER — Ambulatory Visit (HOSPITAL_BASED_OUTPATIENT_CLINIC_OR_DEPARTMENT_OTHER): Admitting: Physical Therapy

## 2024-04-25 ENCOUNTER — Ambulatory Visit

## 2024-04-25 VITALS — BP 122/76 | HR 62 | Ht 61.0 in

## 2024-04-25 DIAGNOSIS — G8929 Other chronic pain: Secondary | ICD-10-CM | POA: Diagnosis not present

## 2024-04-25 DIAGNOSIS — M25551 Pain in right hip: Secondary | ICD-10-CM

## 2024-04-25 DIAGNOSIS — M5442 Lumbago with sciatica, left side: Secondary | ICD-10-CM | POA: Diagnosis not present

## 2024-04-25 DIAGNOSIS — M5441 Lumbago with sciatica, right side: Secondary | ICD-10-CM | POA: Diagnosis not present

## 2024-04-25 DIAGNOSIS — M25562 Pain in left knee: Secondary | ICD-10-CM

## 2024-04-25 DIAGNOSIS — M25561 Pain in right knee: Secondary | ICD-10-CM

## 2024-04-25 DIAGNOSIS — M170 Bilateral primary osteoarthritis of knee: Secondary | ICD-10-CM

## 2024-04-25 NOTE — Patient Instructions (Addendum)
 Thank you for coming in today.   Please get an Xray today before you leave   Please call [facility] ([facility]) at [phone] to schedule your spine injection.    Let me know if we need to inject the hip

## 2024-04-25 NOTE — Progress Notes (Unsigned)
 LILLETTE Ileana Collet, PhD, LAT, ATC acting as a scribe for Artist Lloyd, MD.  Nancy [name] is a 58 y.o. female who presents to [facility] today for exacerbation of her bilat knee pain. Pt was last seen by Dr. Lloyd on 01/11/24 and was given bilat knee steroid injections.  Today, pt reports bilat knee pain returned about 2 wks ago. L>R swelling. She also c/o R hip pain x 3-wks. Pt locates pain to the R anterior thigh, into the groin, w/ a tingling sensation anterior/proximal thigh.  Dx imaging: 12/01/22 R & L knee XR 11/12/2020 R knee XR  Pertinent review of systems: No fevers or chills  Relevant historical information: Asthma rheumatoid arthritis.   Exam:  BP 122/76   Pulse 62   Ht 5' 1 (1.549 m)   SpO2 96%   BMI 44.21 kg/m  General: Well Developed, well nourished, and in no acute distress.   MSK: Knees bilaterally moderate effusion.  Normal motion with crepitation.  Right hip normal motion.  Pain with flexion.    Lab and Radiology Results  Procedure: Real-time Ultrasound Guided Injection of right knee joint superior lateral patella space Device: Philips Affiniti 50G/GE Logiq Images permanently stored and available for review in PACS Verbal informed consent obtained.  Discussed risks and benefits of procedure. Warned about infection, bleeding, hyperglycemia damage to structures among others. Patient expresses understanding and agreement Time-out conducted.   Noted no overlying erythema, induration, or other signs of local infection.   Skin prepped in a sterile fashion.   Local anesthesia: Topical Ethyl chloride.   With sterile technique and under real time ultrasound guidance: 40 mg of Kenalog  and 2 mL of Marcaine  injected into knee joint. Fluid seen entering the joint capsule.   Completed without difficulty   Pain immediately resolved suggesting accurate placement of the medication.   Advised to call if fevers/chills, erythema,  induration, drainage, or persistent bleeding.   Images permanently stored and available for review in the ultrasound unit.  Impression: Technically successful ultrasound guided injection.   Procedure: Real-time Ultrasound Guided Injection of left knee joint superior lateral patella space Device: Philips Affiniti 50G/GE Logiq Images permanently stored and available for review in PACS Verbal informed consent obtained.  Discussed risks and benefits of procedure. Warned about infection, bleeding, hyperglycemia damage to structures among others. Patient expresses understanding and agreement Time-out conducted.   Noted no overlying erythema, induration, or other signs of local infection.   Skin prepped in a sterile fashion.   Local anesthesia: Topical Ethyl chloride.   With sterile technique and under real time ultrasound guidance: 40 mg of Kenalog  and 2 mL of Marcaine  injected into knee joint. Fluid seen entering the joint capsule.   Completed without difficulty   Pain immediately resolved suggesting accurate placement of the medication.   Advised to call if fevers/chills, erythema, induration, drainage, or persistent bleeding.   Images permanently stored and available for review in the ultrasound unit.  Impression: Technically successful ultrasound guided injection.   X-ray images right hip obtained today personally and independently interpreted. Mild DJD right hip.  No acute fractures are visible. Await formal radiology review   Lumbar spine MRI June 2025  IMPRESSION: 1. L1-2: Development of a central to right-sided disc herniation that indents the thecal sac. Definite neural compression is not demonstrated, but this could be a cause of back pain or lumbar nerve root irritation. New since 2023. 2. L3-4: Shallow left posterolateral disc herniation with stenosis  of the left lateral recess that could possibly be symptomatic. This has worsened since 2023. 3. L4-5: Bilateral facet  arthropathy with anterolisthesis of 3 mm, worsened since 2023. 4 x 5 mm synovial cyst associated with the left facet joint projecting in the spinal canal. Stenosis of both lateral recesses that could possibly be symptomatic. This has worsened since 2023. 4. L5-S1: Disc bulge and facet osteoarthritis. No compressive stenosis.     Electronically Signed   By: Oneil Officer M.D.   On: 01/11/2024 15:35   Assessment and Plan: 58 y.o. female with bilateral knee pain due to exacerbation of underlying DJD.  Plan for steroid injection today.  Right hip/anterior thigh pain: Differential includes lumbar radiculopathy at L1 or L2.  Hip pathology is possible but less likely.  She does have the potential for lumbar nerve root impingement at the L1-L2 level based on MRI from June of this year.  Plan for epidural steroid injection targeting this area.  If not improved we can proceed with a diagnostic and hopefully therapeutic hip injection in the future.   PDMP not reviewed this encounter. Orders Placed This Encounter  Procedures   US  LIMITED JOINT SPACE STRUCTURES LOW BILAT(NO LINKED CHARGES)    Reason for Exam (SYMPTOM  OR DIAGNOSIS REQUIRED):   bilateral knee pain    Preferred imaging location?:   [facility]   DG HIP UNILAT W OR W/O PELVIS 2-3 VIEWS RIGHT    Standing Status:   Future    Number of Occurrences:   1    Expiration Date:   05/25/2024    Reason for Exam (SYMPTOM  OR DIAGNOSIS REQUIRED):   right hip pain    Preferred imaging location?:   [facility]    Is patient pregnant?:   No   DG INJECT DIAG/THERA/INC NEEDLE/CATH/PLC EPI/LUMB/SAC W/IMG    Level and technique per radiology    Standing Status:   Future    Expiration Date:   05/25/2024    Reason for Exam (SYMPTOM  OR DIAGNOSIS REQUIRED):   Low back pain    Preferred Imaging Location?:   [facility]    Is the patient pregnant?:   No   No orders of the defined types were placed in this  encounter.    Discussed warning signs or symptoms. Please see discharge instructions. Patient expresses understanding.   The above documentation has been reviewed and is accurate and complete Artist Lloyd, M.D.

## 2024-04-29 ENCOUNTER — Other Ambulatory Visit (HOSPITAL_BASED_OUTPATIENT_CLINIC_OR_DEPARTMENT_OTHER): Payer: Self-pay

## 2024-04-29 ENCOUNTER — Other Ambulatory Visit: Payer: Self-pay | Admitting: Physician Assistant

## 2024-04-29 MED ORDER — VALACYCLOVIR HCL 1 G PO TABS
1000.0000 mg | ORAL_TABLET | Freq: Two times a day (BID) | ORAL | 0 refills | Status: AC
  Filled 2024-04-29: qty 30, 15d supply, fill #0

## 2024-04-30 ENCOUNTER — Encounter (HOSPITAL_BASED_OUTPATIENT_CLINIC_OR_DEPARTMENT_OTHER): Payer: Self-pay | Admitting: Physical Therapy

## 2024-04-30 ENCOUNTER — Ambulatory Visit (HOSPITAL_BASED_OUTPATIENT_CLINIC_OR_DEPARTMENT_OTHER): Payer: Self-pay | Admitting: Physical Therapy

## 2024-04-30 DIAGNOSIS — G8929 Other chronic pain: Secondary | ICD-10-CM

## 2024-04-30 DIAGNOSIS — M25561 Pain in right knee: Secondary | ICD-10-CM | POA: Diagnosis not present

## 2024-04-30 DIAGNOSIS — M5459 Other low back pain: Secondary | ICD-10-CM

## 2024-04-30 DIAGNOSIS — M6281 Muscle weakness (generalized): Secondary | ICD-10-CM | POA: Diagnosis not present

## 2024-04-30 DIAGNOSIS — M5416 Radiculopathy, lumbar region: Secondary | ICD-10-CM | POA: Diagnosis not present

## 2024-04-30 DIAGNOSIS — M170 Bilateral primary osteoarthritis of knee: Secondary | ICD-10-CM | POA: Diagnosis not present

## 2024-04-30 DIAGNOSIS — M25562 Pain in left knee: Secondary | ICD-10-CM | POA: Diagnosis not present

## 2024-04-30 DIAGNOSIS — M797 Fibromyalgia: Secondary | ICD-10-CM | POA: Diagnosis not present

## 2024-04-30 NOTE — Therapy (Signed)
 OUTPATIENT PHYSICAL THERAPY THORACOLUMBAR TREATMENT    Patient Name: Nancy [name] MRN: [id number] DOB:January 27, 1966, 58 y.o., female Today's Date: 04/30/2024  END OF SESSION:  PT End of Session - 04/30/24 1533     Visit Number 3    Date for Recertification  06/07/24    Authorization Type aetna    PT Start Time 1525    PT Stop Time 1605    PT Time Calculation (min) 40 min    Activity Tolerance Patient tolerated treatment well    Behavior During Therapy WFL for tasks assessed/performed            Past Medical History:  Diagnosis Date   Anemia    Asthma    Bradycardia    Chicken pox    Dysrhythmia    occ. palpitations   Fibromyalgia    Headache(784.0)    Migraines    Neck pain    bulging disk   Polyarthralgia 02/21/2019   PONV (postoperative nausea and vomiting)    sometimes wakes up after surgery and cannot breath due to asthma, can be bradycardic   Rheumatoid arthritis involving multiple sites with positive rheumatoid factor (HCC) 02/21/2019   Shortness of breath    with bradycardia   Past Surgical History:  Procedure Laterality Date   ABDOMINAL HYSTERECTOMY     back procedure     nerve ablation to lower back 05/11/23, 1011/24   BACK SURGERY     cervical fusion C5,6,7 with a bulge at [facility]   BLADDER SURGERY  July 10th 2013   CESAREAN SECTION     1992 and 1995   CHOLECYSTECTOMY N/A 10/07/2021   Procedure: LAPAROSCOPIC CHOLECYSTECTOMY;  Surgeon: Vernetta Berg, MD;  Location: [facility];  Service: General;  Laterality: N/A;   COLONOSCOPY  11/27/2015   Dr.Pyrtle   CYSTOSCOPY N/A 07/31/2018   Procedure: PHYLLIS;  Surgeon: Gaston Hamilton, MD;  Location: [facility];  Service: Urology;  Laterality: N/A;   DIAGNOSTIC LAPAROSCOPY     EXCISION OF MESH N/A 07/31/2018   Procedure: REMOVAL OF VAGINAL MESH;  Surgeon: Gaston Hamilton, MD;  Location: [facility];  Service: Urology;  Laterality: N/A;   LAPAROSCOPY Bilateral 05/31/2013   Procedure:  LAPAROSCOPY withbilateral salpingo-oophorectomy, lysis of adhesions;  Surgeon: Lynwood FORBES Curlene PONCE, MD;  Location: [facility];  Service: Gynecology;  Laterality: Bilateral;  with clippings of Vaginal  mesh   SALPINGOOPHORECTOMY Bilateral 05/31/2013   Procedure: BILATERAL SALPINGO OOPHORECTOMY/TRIM MESH;  Surgeon: Lynwood FORBES Curlene PONCE, MD;  Location: [facility];  Service: Gynecology;  Laterality: Bilateral;   TUBAL LIGATION     1995   VAGINAL HYSTERECTOMY     1998 or 1997   WRIST FRACTURE SURGERY  2005   Patient Active Problem List   Diagnosis Date Noted   Lumbar radiculopathy 03/16/2023   Chronic pain 03/16/2023   Asthma 03/07/2022   Immunosuppressed status 12/26/2019   Hyperlipidemia 12/25/2019   Rheumatoid arthritis involving multiple sites with positive rheumatoid factor (HCC) 02/21/2019   Vitamin B12 deficiency 02/14/2019   Urge incontinence 12/20/2018   Urinary frequency 12/20/2018   Fibromyalgia 04/12/2018   Vitamin D  deficiency 04/12/2018   Vaginal dryness 04/12/2018   History of total hysterectomy 04/12/2018   Insomnia 04/12/2018   Morbid obesity (HCC) 04/12/2018   History of colon polyps 04/12/2018   Primary osteoarthritis of both knees 12/28/2017   Cervicogenic headache 03/26/2014   Cervical radiculopathy 03/26/2014   Mild intermittent asthma 11/20/2013   Bradycardia 07/04/2012    PCP: Katrinka,  Garnette KIDD, MD   REFERRING PROVIDER: Katrinka Garnette KIDD, MD   REFERRING DIAG:  [id number] (ICD-10-CM) - Lumbar radiculopathy  M79.7 (ICD-10-CM) - Fibromyalgia  M17.0 (ICD-10-CM) - Primary osteoarthritis of both knees    Rationale for Evaluation and Treatment: Rehabilitation  THERAPY DIAG:  Other low back pain  Chronic pain of both knees  Muscle weakness (generalized)  ONSET DATE: exacerbated last year  SUBJECTIVE:                                                                                                                                                                                            SUBJECTIVE STATEMENT: Injection in knees last week, do feel better.  Pain in right hip and thigh increased after  last land appt. Md said to lay off of land appt. Pain 4-5/10     Eval: Chronic LBP had epidural early July then facets done a few weeks later.  Epidural knocked out LB and left hip pain and the radiating pain down legs.  Knees oa with steroid injections last in June with good response. Due soon but trying to extend it to wait.  I am getting steroids injection every few months.  Do have some pain on lateral side of left foot. Prediabetic.  PERTINENT HISTORY:  RA; OA; fibromyalgia (x 10 yrs ago)  PAIN:  Are you having pain? Yes: NPRS scale: 4-5/10 normal Pain location: B knees and LB Pain description: constant and variable achy LB; sharp knees, burning in R thigh  Aggravating factors: house work, being up on it  Relieving factors: resting; ice/heat; tramadol ; Ibuprofen   PRECAUTIONS: None  RED FLAGS: None   WEIGHT BEARING RESTRICTIONS: No  FALLS:  Has patient fallen in last 6 months? Yes. 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  DIAGNOSTIC FINDINGS:  MRI Lumbar IMPRESSION: 1. L1-2: Development of a central to right-sided disc herniation that indents the thecal sac. Definite neural compression is not demonstrated, but this could be a cause of back pain or lumbar nerve root irritation. New since 2023. 2. L3-4: Shallow left posterolateral disc herniation with stenosis of the left lateral recess that could possibly be symptomatic. This has  worsened since 2023. 3. L4-5: Bilateral facet arthropathy with anterolisthesis  of 3 mm, worsened since 2023. 4 x 5 mm synovial cyst associated with the left facet joint projecting in the spinal canal. Stenosis of both lateral recesses that could possibly be symptomatic. This has worsened since 2023. 4. L5-S1: Disc bulge and facet osteoarthritis. No compressive stenosis.  PATIENT SURVEYS:  LEFS 32/80  COGNITION: Overall cognitive status: Within functional limits for tasks assessed     SENSATION: Numbness left last 3 digit and calf  MUSCLE LENGTH: Hamstrings: wfl tested in sitting   POSTURE: rounded shoulders, forward head, decreased lumbar lordosis, and bilateral valgus deformities knee R>L  PALPATION: TTP throughout lower thoracic and lumbar paraspinals  LUMBAR ROM:   AROM eval  Flexion full  Extension neutral  Right lateral flexion Limited 75% P!  Left lateral flexion Limited 75% P!  Right rotation   Left rotation    (Blank rows = not tested)  LOWER EXTREMITY ROM:     WFL  LOWER EXTREMITY MMT:    HD (lb) Tested in sitting Right eval Left eval  Hip flexion 31.7 34.1  Hip extension    Hip abduction 22.2 18.2  Hip adduction    Hip internal rotation    Hip external rotation    Knee flexion    Knee extension 14.3 11.8  Ankle dorsiflexion    Ankle plantarflexion    Ankle inversion    Ankle eversion     (Blank rows = not tested)  LUMBAR SPECIAL TESTS:  Slump test: Negative  FUNCTIONAL TESTS:  5 times sit to stand: 15.14 from pool bench Timed up and go (TUG): 16.11   4 stage balance: Passed 1&2. Tandem: needs assist gaining position holding x 4s; SLS unable GAIT: Distance walked: 400 ft Assistive device utilized: None Level of assistance: Complete Independence Comments: right knee valgus deformity causes hip circumduction to advance rle during swing.  Initial gait antalgic, reduces with distance  TREATMENT  [facility] Adult PT Treatment:                                                 DATE: 04/30/24 Pt seen for aquatic therapy today.  Treatment took place in water  3.5-4.75 ft in depth at the [facility] pool. Temp of water  was 91.  Pt entered/exited the pool via stairs using step to pattern with hand rail.  *Intro to setting *walking forward, back and side stepping in 3.6 -4.65ft with unsupported  - Squatted rest period *L stretch *decompression position with noodle wrapped  posteriorly across chest: cycling x 2 widths: ue support corner wall: hip add/abd; flex/ext - squatted rest period *Ue support on wall: toe raises; heel raises; hip add/abd; hip extension; relaxed squats  Pt requires the buoyancy and hydrostatic pressure of water  for support, and to offload joints by unweighting joint load by at least 50 % in navel deep water  and by at least 75-80% in chest to neck deep water .  Viscosity of the water  is needed for resistance of strengthening. Water  current perturbations provides challenge to standing balance requiring increased core activation.      04/23/24  Nustep L5x6 minutes all four extremities, seat 9 for w/u   Bridges + ABD into red TB x12 Sidelying clams red TB x12 PPT 15x3 second holds Lumbar rotation stretches 10x5 seconds B PPT + march x12  SKTC 5x3 seconds B  Figure 4 stretch 3x30 seconds  B supine HS stretches hooklying 2x30 seconds B with sheet  Forward QL stretch with stool 3x30 seconds                                                                                                                                   PATIENT EDUCATION:  Education details: Discussed eval findings, rehab rationale, aquatic program progression/POC and pools in area. Patient is in agreement  Person educated: Patient Education method: Explanation Education comprehension: verbalized understanding  HOME EXERCISE PROGRAM:  Aquatic to be assigned   Land HEP:  Access Code: [email or web address] URL:  [email or web address] Date: 04/23/2024 Prepared by: Josette Rough      ASSESSMENT:  CLINICAL IMPRESSION: Pt reports poor toleration due to increased R lb and hip/thigh pain/numbness and burning after 1st land session.  She was complaining of burning in right thigh prior to that appt.  Saw MD  He requested avoiding land based appts for a while. Knees are better from injections received. Pt demonstrates safety and independence in aquatic setting with therapist instructing from deck. She is confident in setting, moving throughout all depths easily.  Pt is directed through various movement patterns and trials in both sitting and standing positions.   She is provided VC and demonstration throughout session for execution of exercises and monitoring toleration.She completes all as instructed but does report and increase in burning in right posterior thigh to mid point. She is waiting for x-ray results then will decide on possible epidural injection  Goals are ongoing.      EVAL: Patient is a 58 y.o. f who was seen today for physical therapy evaluation and treatment for fibromyalgia, LBP and bilateral knee pain. She presents with pain limited deficits in  knees, Lb and left foot due to above dx which effect her strength, endurance, activity tolerance, gait, balance, and functional mobility. She has steroid shots routinely in Lumbar spin and knees which have been effective in her pain management up to this point.  Her goals are to improve her strength and learn to manage her pain. Patient will benefit from skilled PT intervention and will be seen in both aquatic and land based clinic to optimize her outcomes with focus on improving all deficits.   OBJECTIVE IMPAIRMENTS: Abnormal gait, decreased activity tolerance, decreased balance, decreased mobility, decreased strength, obesity, and pain.   ACTIVITY LIMITATIONS: carrying, lifting, bending, squatting, stairs, and transfers  PARTICIPATION  LIMITATIONS: cleaning, shopping, and community activity  PERSONAL FACTORS: Time since onset of injury/illness/exacerbation are also affecting patient's functional outcome.   REHAB POTENTIAL: Good  CLINICAL DECISION MAKING: Stable/uncomplicated  EVALUATION COMPLEXITY: Low   GOALS: Goals reviewed with patient? Yes  SHORT TERM GOALS: Target date: 05/15/24  Pt will tolerate full aquatic sessions consistently without increase in pain and with improving function to demonstrate good toleration and effectiveness of intervention.  Baseline: Goal status: INITIAL  2.  Pt will consider gaining pool access for use of  the properties of water  for chronic conditions maintaining mobility and minimizing pain.  Baseline:  Goal status: INITIAL    LONG TERM GOALS: Target date: 06/07/24  Pt to improve on LEFS by at least 9 point to demonstrate statistically significant Improvement in function. Baseline: 32/80 Goal status: INITIAL  2.  Pt will improve on Tug test to <or= 13s (13.5 avg)  to demonstrate improvement in lower extremity function, mobility and decreased fall risk. Baseline: 16.11 Goal status: INITIAL  3.  Pt will report decrease in pain by at least 50% for improved toleration to activity/quality of life and to demonstrate improved management of pain. Baseline:  Goal status: INITIAL  4.  Pt will improve strength in knee by 5 lbs  to demonstrate improved overall physical function Baseline: see chart Goal status: INITIAL    PLAN:  PT FREQUENCY: 2x/week  PT DURATION: 8 weeks 12 visits likely alternating land and water   PLANNED INTERVENTIONS: [id number]- PT Re-evaluation, 97750- Physical Performance Testing, 97110-Therapeutic exercises, 97530- Therapeutic activity, [id number]- Neuromuscular re-education, 97535- Self Care, [id number]- Manual therapy, [id number]- Gait training, [phone]- Orthotic Initial, [phone]- Aquatic Therapy, [phone]- Ionotophoresis 4mg /ml Dexamethasone , 79439 (1-2 muscles), 20561 (3+  muscles)- Dry Needling, Patient/Family education, Balance training, Stair training, Taping, Joint mobilization, DME instructions, Cryotherapy, and Moist heat.  PLAN FOR NEXT SESSION: Aquatic: core/hip/le strengthening and stretching; pain management  Land: core/hip strength, conditioning, stretching as appropriate    Ronal Foots) Pretty Weltman MPT 04/30/24 4:08 PM [facility] [address] [place], [place], [address] Phone: [phone]   Fax:  [phone]

## 2024-05-02 ENCOUNTER — Encounter (HOSPITAL_BASED_OUTPATIENT_CLINIC_OR_DEPARTMENT_OTHER): Payer: Self-pay | Admitting: Physical Therapy

## 2024-05-02 ENCOUNTER — Ambulatory Visit (HOSPITAL_BASED_OUTPATIENT_CLINIC_OR_DEPARTMENT_OTHER): Attending: Family Medicine | Admitting: Physical Therapy

## 2024-05-02 ENCOUNTER — Encounter (HOSPITAL_BASED_OUTPATIENT_CLINIC_OR_DEPARTMENT_OTHER): Admitting: Physical Therapy

## 2024-05-02 ENCOUNTER — Ambulatory Visit: Payer: Self-pay | Admitting: Family Medicine

## 2024-05-02 DIAGNOSIS — M5459 Other low back pain: Secondary | ICD-10-CM | POA: Insufficient documentation

## 2024-05-02 DIAGNOSIS — G8929 Other chronic pain: Secondary | ICD-10-CM | POA: Insufficient documentation

## 2024-05-02 DIAGNOSIS — M6281 Muscle weakness (generalized): Secondary | ICD-10-CM | POA: Diagnosis not present

## 2024-05-02 DIAGNOSIS — M0579 Rheumatoid arthritis with rheumatoid factor of multiple sites without organ or systems involvement: Secondary | ICD-10-CM | POA: Diagnosis not present

## 2024-05-02 DIAGNOSIS — M25562 Pain in left knee: Secondary | ICD-10-CM | POA: Insufficient documentation

## 2024-05-02 DIAGNOSIS — M25561 Pain in right knee: Secondary | ICD-10-CM | POA: Insufficient documentation

## 2024-05-02 NOTE — Progress Notes (Signed)
 Right hip x-ray looks normal to radiology.  No significant arthritis.

## 2024-05-02 NOTE — Therapy (Signed)
 OUTPATIENT PHYSICAL THERAPY THORACOLUMBAR TREATMENT    Patient Name: Nancy [name] MRN: [id number] DOB:1965/08/22, 58 y.o., female Today's Date: 05/02/2024  END OF SESSION:  PT End of Session - 05/02/24 1047     Visit Number 4    Date for Recertification  06/07/24    Authorization Type aetna    PT Start Time 1047    PT Stop Time 1129    PT Time Calculation (min) 42 min    Activity Tolerance Patient tolerated treatment well    Behavior During Therapy WFL for tasks assessed/performed            Past Medical History:  Diagnosis Date   Anemia    Asthma    Bradycardia    Chicken pox    Dysrhythmia    occ. palpitations   Fibromyalgia    Headache(784.0)    Migraines    Neck pain    bulging disk   Polyarthralgia 02/21/2019   PONV (postoperative nausea and vomiting)    sometimes wakes up after surgery and cannot breath due to asthma, can be bradycardic   Rheumatoid arthritis involving multiple sites with positive rheumatoid factor (HCC) 02/21/2019   Shortness of breath    with bradycardia   Past Surgical History:  Procedure Laterality Date   ABDOMINAL HYSTERECTOMY     back procedure     nerve ablation to lower back 05/11/23, 1011/24   BACK SURGERY     cervical fusion C5,6,7 with a bulge at [facility]   BLADDER SURGERY  July 10th 2013   CESAREAN SECTION     1992 and 1995   CHOLECYSTECTOMY N/A 10/07/2021   Procedure: LAPAROSCOPIC CHOLECYSTECTOMY;  Surgeon: Vernetta Berg, MD;  Location: [facility];  Service: General;  Laterality: N/A;   COLONOSCOPY  11/27/2015   Dr.Pyrtle   CYSTOSCOPY N/A 07/31/2018   Procedure: PHYLLIS;  Surgeon: Gaston Hamilton, MD;  Location: [facility];  Service: Urology;  Laterality: N/A;   DIAGNOSTIC LAPAROSCOPY     EXCISION OF MESH N/A 07/31/2018   Procedure: REMOVAL OF VAGINAL MESH;  Surgeon: Gaston Hamilton, MD;  Location: [facility];  Service: Urology;  Laterality: N/A;   LAPAROSCOPY Bilateral 05/31/2013   Procedure:  LAPAROSCOPY withbilateral salpingo-oophorectomy, lysis of adhesions;  Surgeon: Lynwood FORBES Curlene PONCE, MD;  Location: [facility];  Service: Gynecology;  Laterality: Bilateral;  with clippings of Vaginal  mesh   SALPINGOOPHORECTOMY Bilateral 05/31/2013   Procedure: BILATERAL SALPINGO OOPHORECTOMY/TRIM MESH;  Surgeon: Lynwood FORBES Curlene PONCE, MD;  Location: [facility];  Service: Gynecology;  Laterality: Bilateral;   TUBAL LIGATION     1995   VAGINAL HYSTERECTOMY     1998 or 1997   WRIST FRACTURE SURGERY  2005   Patient Active Problem List   Diagnosis Date Noted   Lumbar radiculopathy 03/16/2023   Chronic pain 03/16/2023   Asthma 03/07/2022   Immunosuppressed status 12/26/2019   Hyperlipidemia 12/25/2019   Rheumatoid arthritis involving multiple sites with positive rheumatoid factor (HCC) 02/21/2019   Vitamin B12 deficiency 02/14/2019   Urge incontinence 12/20/2018   Urinary frequency 12/20/2018   Fibromyalgia 04/12/2018   Vitamin D  deficiency 04/12/2018   Vaginal dryness 04/12/2018   History of total hysterectomy 04/12/2018   Insomnia 04/12/2018   Morbid obesity (HCC) 04/12/2018   History of colon polyps 04/12/2018   Primary osteoarthritis of both knees 12/28/2017   Cervicogenic headache 03/26/2014   Cervical radiculopathy 03/26/2014   Mild intermittent asthma 11/20/2013   Bradycardia 07/04/2012    PCP: Katrinka,  Garnette KIDD, MD   REFERRING PROVIDER: Katrinka Garnette KIDD, MD   REFERRING DIAG:  [phone] (ICD-10-CM) - Lumbar radiculopathy  M79.7 (ICD-10-CM) - Fibromyalgia  M17.0 (ICD-10-CM) - Primary osteoarthritis of both knees    Rationale for Evaluation and Treatment: Rehabilitation  THERAPY DIAG:  Other low back pain  Chronic pain of both knees  Muscle weakness (generalized)  ONSET DATE: exacerbated last year  SUBJECTIVE:                                                                                                                                                                                            SUBJECTIVE STATEMENT: Pt reports her hip xray came back fine. She states she has a neuro consult in Nov; hoping for surgery in December, I've put it off long enough.  Knees feel better since injections last Thursday.  Pt reports increased pain in Rt groin and lower back the night of last session.   POOL ACCESS: currently none, but my look into [facility].    Eval: Chronic LBP had epidural early July then facets done a few weeks later.  Epidural knocked out LB and left hip pain and the radiating pain down legs.  Knees oa with steroid injections last in June with good response. Due soon but trying to extend it to wait.  I am getting steroids injection every few months.  Do have some pain on lateral side of left foot. Prediabetic.  PERTINENT HISTORY:  RA; OA; fibromyalgia (x 10 yrs ago)  PAIN:  Are you having pain? Yes: NPRS scale: 3/10 normal Pain location: Rt groin and lower back Pain description: constant and variable achy LB;   Aggravating factors: house work, being up on it  Relieving factors: resting; ice/heat; tramadol ; Ibuprofen   PRECAUTIONS: None  RED FLAGS: None   WEIGHT BEARING RESTRICTIONS: No  FALLS:  Has patient fallen in last 6 months? Yes. 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  DIAGNOSTIC FINDINGS:  MRI Lumbar IMPRESSION: 1. L1-2: Development of a central to right-sided disc herniation that indents the thecal sac. Definite neural compression is not demonstrated, but this could be a cause of back pain or lumbar nerve root irritation.  New since 2023. 2. L3-4: Shallow  left posterolateral disc herniation with stenosis of the left lateral recess that could possibly be symptomatic. This has worsened since 2023. 3. L4-5: Bilateral facet arthropathy with anterolisthesis of 3 mm, worsened since 2023. 4 x 5 mm synovial cyst associated with the left facet joint projecting in the spinal canal. Stenosis of both lateral recesses that could possibly be symptomatic. This has worsened since 2023. 4. L5-S1: Disc bulge and facet osteoarthritis. No compressive stenosis.  PATIENT SURVEYS:  LEFS 32/80  COGNITION: Overall cognitive status: Within functional limits for tasks assessed     SENSATION: Numbness left last 3 digit and calf  MUSCLE LENGTH: Hamstrings: wfl tested in sitting   POSTURE: rounded shoulders, forward head, decreased lumbar lordosis, and bilateral valgus deformities knee R>L  PALPATION: TTP throughout lower thoracic and lumbar paraspinals  LUMBAR ROM:   AROM eval  Flexion full  Extension neutral  Right lateral flexion Limited 75% P!  Left lateral flexion Limited 75% P!  Right rotation   Left rotation    (Blank rows = not tested)  LOWER EXTREMITY ROM:     WFL  LOWER EXTREMITY MMT:    HD (lb) Tested in sitting Right eval Left eval  Hip flexion 31.7 34.1  Hip extension    Hip abduction 22.2 18.2  Hip adduction    Hip internal rotation    Hip external rotation    Knee flexion    Knee extension 14.3 11.8  Ankle dorsiflexion    Ankle plantarflexion    Ankle inversion    Ankle eversion     (Blank rows = not tested)  LUMBAR SPECIAL TESTS:  Slump test: Negative  FUNCTIONAL TESTS:  5 times sit to stand: 15.14 from pool bench Timed up and go (TUG): 16.11   4 stage balance: Passed 1&2. Tandem: needs assist gaining position holding x 4s; SLS unable GAIT: Distance walked: 400 ft Assistive device utilized: None Level of assistance: Complete Independence Comments: right knee valgus  deformity causes hip circumduction to advance rle during swing.  Initial gait antalgic, reduces with distance  TREATMENT  [facility] Adult PT Treatment:                                                DATE: 05/02/24 Pt seen for aquatic therapy today.  Treatment took place in water  3.5-4.75 ft in depth at the [facility] pool. Temp of water  was 91.  Pt entered/exited the pool via stairs using step to pattern with hand rail.   *unsupported: walking forward/ backward (increased discomfort walking backward) * side stepping  R/L (increased burning in Rt groin moving Rt)-> with slight increase in step height (improved symptoms) * backwards marching (improved) * Squatted rest period * straddling noodle: cycling across pool, relaxed arms - 2 laps *side stepping with arm add/abdct (improved tolerance) * suitcase carry with bil rainbow hand floats - 1 width, not tolerated * squatted rest * UE On wall:  heel raises x 10; hip abdct/ add x 10 each LE * TrA set with 1/2 hollow noodle x 10 * staggered stance with horz abdct/add with rainbow hand floats  * L stretch in shallow area   [facility] Adult PT Treatment:  DATE: 04/30/24 Pt seen for aquatic therapy today.  Treatment took place in water  3.5-4.75 ft in depth at the [facility] pool. Temp of water  was 91.  Pt entered/exited the pool via stairs using step to pattern with hand rail.  *Intro to setting *walking forward, back and side stepping in 3.6 -4.38ft with unsupported  - Squatted rest period *L stretch *decompression position with noodle wrapped  posteriorly across chest: cycling x 2 widths: ue support corner wall: hip add/abd; flex/ext - squatted rest period *Ue support on wall: toe raises; heel raises; hip add/abd; hip extension; relaxed squats    04/23/24  Nustep L5x6 minutes all four extremities, seat 9 for w/u   Bridges + ABD into red TB x12 Sidelying clams red TB x12 PPT 15x3  second holds Lumbar rotation stretches 10x5 seconds B PPT + march x12  SKTC 5x3 seconds B  Figure 4 stretch 3x30 seconds B supine HS stretches hooklying 2x30 seconds B with sheet  Forward QL stretch with stool 3x30 seconds                                                                                                                                   PATIENT EDUCATION:  Education details: intro to aquatic therapy  Person educated: Patient Education method: Programmer, multimedia, demo Education comprehension: verbalized understanding  HOME EXERCISE PROGRAM:  Aquatic to be assigned   Land HEP:  Access Code: [email or web address] URL: [email or web address] Date: 04/23/2024 Prepared by: Josette Rough   ASSESSMENT:  CLINICAL IMPRESSION: Pt reported increase in back pressure initially with backwards gait; improved with increased step height and increased time in pool. Gradual reduction of Rt groin pain during session, but no change in numbness in bil feet. Limited tolerance for suitcase carry, but otherwise good tolerance to session.  Goals are ongoing.      EVAL: Patient is a 58 y.o. f who was seen today for physical therapy evaluation and treatment for fibromyalgia, LBP and bilateral knee pain. She presents with pain limited deficits in  knees, Lb and left foot due to above dx which effect her strength, endurance, activity tolerance, gait, balance, and functional mobility. She has steroid shots routinely in Lumbar spin and knees which have been effective in her pain management up to this point.  Her goals are to improve her strength and learn to manage her pain. Patient will benefit from skilled PT intervention and will be seen in both aquatic and land based clinic to optimize her outcomes with focus on improving all deficits.   OBJECTIVE IMPAIRMENTS: Abnormal gait, decreased activity tolerance, decreased balance, decreased mobility, decreased strength, obesity, and pain.   ACTIVITY  LIMITATIONS: carrying, lifting, bending, squatting, stairs, and transfers  PARTICIPATION LIMITATIONS: cleaning, shopping, and community activity  PERSONAL FACTORS: Time since onset of injury/illness/exacerbation are also affecting patient's functional outcome.   REHAB POTENTIAL: Good  CLINICAL DECISION MAKING: Stable/uncomplicated  EVALUATION COMPLEXITY: Low   GOALS: Goals  reviewed with patient? Yes  SHORT TERM GOALS: Target date: 05/15/24  Pt will tolerate full aquatic sessions consistently without increase in pain and with improving function to demonstrate good toleration and effectiveness of intervention.  Baseline: Goal status: INITIAL  2.  Pt will consider gaining pool access for use of the properties of water  for chronic conditions maintaining mobility and minimizing pain.  Baseline:  Goal status: INITIAL    LONG TERM GOALS: Target date: 06/07/24  Pt to improve on LEFS by at least 9 point to demonstrate statistically significant Improvement in function. Baseline: 32/80 Goal status: INITIAL  2.  Pt will improve on Tug test to <or= 13s (13.5 avg)  to demonstrate improvement in lower extremity function, mobility and decreased fall risk. Baseline: 16.11 Goal status: INITIAL  3.  Pt will report decrease in pain by at least 50% for improved toleration to activity/quality of life and to demonstrate improved management of pain. Baseline:  Goal status: INITIAL  4.  Pt will improve strength in knee by 5 lbs  to demonstrate improved overall physical function Baseline: see chart Goal status: INITIAL    PLAN:  PT FREQUENCY: 2x/week  PT DURATION: 8 weeks 12 visits likely alternating land and water   PLANNED INTERVENTIONS: [id number]- PT Re-evaluation, 97750- Physical Performance Testing, 97110-Therapeutic exercises, 97530- Therapeutic activity, 97112- Neuromuscular re-education, 97535- Self Care, [id number]- Manual therapy, [id number]- Gait training, [phone]- Orthotic Initial, [phone]-  Aquatic Therapy, [phone]- Ionotophoresis 4mg /ml Dexamethasone , 79439 (1-2 muscles), 20561 (3+ muscles)- Dry Needling, Patient/Family education, Balance training, Stair training, Taping, Joint mobilization, DME instructions, Cryotherapy, and Moist heat.  PLAN FOR NEXT SESSION: Aquatic: core/hip/le strengthening and stretching; pain management  Land: core/hip strength, conditioning, stretching as appropriate   Delon Aquas, PTA 05/02/24 11:35 AM [facility] [address] [place], [place], [address] Phone: [phone]   Fax:  [phone]

## 2024-05-07 ENCOUNTER — Ambulatory Visit (HOSPITAL_BASED_OUTPATIENT_CLINIC_OR_DEPARTMENT_OTHER): Admitting: Physical Therapy

## 2024-05-07 ENCOUNTER — Encounter (HOSPITAL_BASED_OUTPATIENT_CLINIC_OR_DEPARTMENT_OTHER): Payer: Self-pay | Admitting: Physical Therapy

## 2024-05-07 DIAGNOSIS — G8929 Other chronic pain: Secondary | ICD-10-CM | POA: Diagnosis not present

## 2024-05-07 DIAGNOSIS — M25561 Pain in right knee: Secondary | ICD-10-CM | POA: Diagnosis not present

## 2024-05-07 DIAGNOSIS — M6281 Muscle weakness (generalized): Secondary | ICD-10-CM

## 2024-05-07 DIAGNOSIS — M25562 Pain in left knee: Secondary | ICD-10-CM | POA: Diagnosis not present

## 2024-05-07 DIAGNOSIS — M5459 Other low back pain: Secondary | ICD-10-CM

## 2024-05-07 NOTE — Therapy (Signed)
 OUTPATIENT PHYSICAL THERAPY THORACOLUMBAR TREATMENT    Patient Name: Lovelyn Sheeran MRN: [id number] DOB:Dec 01, 1965, 58 y.o., female Today's Date: 05/07/2024  END OF SESSION:  PT End of Session - 05/07/24 1621     Visit Number 5    Date for Recertification  06/07/24    Authorization Type aetna    PT Start Time 1612    PT Stop Time 1653    PT Time Calculation (min) 41 min    Activity Tolerance Patient tolerated treatment well    Behavior During Therapy [facility] for tasks assessed/performed             Past Medical History:  Diagnosis Date   Anemia    Asthma    Bradycardia    Chicken pox    Dysrhythmia    occ. palpitations   Fibromyalgia    Headache(784.0)    Migraines    Neck pain    bulging disk   Polyarthralgia 02/21/2019   PONV (postoperative nausea and vomiting)    sometimes wakes up after surgery and cannot breath due to asthma, can be bradycardic   Rheumatoid arthritis involving multiple sites with positive rheumatoid factor (HCC) 02/21/2019   Shortness of breath    with bradycardia   Past Surgical History:  Procedure Laterality Date   ABDOMINAL HYSTERECTOMY     back procedure     nerve ablation to lower back 05/11/23, 1011/24   BACK SURGERY     cervical fusion C5,6,7 with a bulge at [facility]   BLADDER SURGERY  July 10th 2013   CESAREAN SECTION     1992 and 1995   CHOLECYSTECTOMY N/A 10/07/2021   Procedure: LAPAROSCOPIC CHOLECYSTECTOMY;  Surgeon: Vernetta Berg, MD;  Location: [facility];  Service: General;  Laterality: N/A;   COLONOSCOPY  11/27/2015   Dr.Pyrtle   CYSTOSCOPY N/A 07/31/2018   Procedure: PHYLLIS;  Surgeon: Gaston Hamilton, MD;  Location: [facility];  Service: Urology;  Laterality: N/A;   DIAGNOSTIC LAPAROSCOPY     EXCISION OF MESH N/A 07/31/2018   Procedure: REMOVAL OF VAGINAL MESH;  Surgeon: Gaston Hamilton, MD;  Location: [facility];  Service: Urology;  Laterality: N/A;   LAPAROSCOPY Bilateral 05/31/2013   Procedure:  LAPAROSCOPY withbilateral salpingo-oophorectomy, lysis of adhesions;  Surgeon: Lynwood FORBES Curlene PONCE, MD;  Location: [facility];  Service: Gynecology;  Laterality: Bilateral;  with clippings of Vaginal  mesh   SALPINGOOPHORECTOMY Bilateral 05/31/2013   Procedure: BILATERAL SALPINGO OOPHORECTOMY/TRIM MESH;  Surgeon: Lynwood FORBES Curlene PONCE, MD;  Location: [facility];  Service: Gynecology;  Laterality: Bilateral;   TUBAL LIGATION     1995   VAGINAL HYSTERECTOMY     1998 or 1997   WRIST FRACTURE SURGERY  2005   Patient Active Problem List   Diagnosis Date Noted   Lumbar radiculopathy 03/16/2023   Chronic pain 03/16/2023   Asthma 03/07/2022   Immunosuppressed status 12/26/2019   Hyperlipidemia 12/25/2019   Rheumatoid arthritis involving multiple sites with positive rheumatoid factor (HCC) 02/21/2019   Vitamin B12 deficiency 02/14/2019   Urge incontinence 12/20/2018   Urinary frequency 12/20/2018   Fibromyalgia 04/12/2018   Vitamin D  deficiency 04/12/2018   Vaginal dryness 04/12/2018   History of total hysterectomy 04/12/2018   Insomnia 04/12/2018   Morbid obesity (HCC) 04/12/2018   History of colon polyps 04/12/2018   Primary osteoarthritis of both knees 12/28/2017   Cervicogenic headache 03/26/2014   Cervical radiculopathy 03/26/2014   Mild intermittent asthma 11/20/2013   Bradycardia 07/04/2012    PCP:  Katrinka Garnette KIDD, MD   REFERRING PROVIDER: Katrinka Garnette KIDD, MD   REFERRING DIAG:  [phone] (ICD-10-CM) - Lumbar radiculopathy  M79.7 (ICD-10-CM) - Fibromyalgia  M17.0 (ICD-10-CM) - Primary osteoarthritis of both knees    Rationale for Evaluation and Treatment: Rehabilitation  THERAPY DIAG:  Other low back pain  Chronic pain of both knees  Muscle weakness (generalized)  ONSET DATE: exacerbated last year  SUBJECTIVE:                                                                                                                                                                                            SUBJECTIVE STATEMENT: Pt has consultation with neurosurgeon next Friday.  Pt reports increased pain day following last session.   (Pt reports more relief with extension instead of flexion)   POOL ACCESS: currently none, but my look into [facility].    Eval: Chronic LBP had epidural early July then facets done a few weeks later.  Epidural knocked out LB and left hip pain and the radiating pain down legs.  Knees oa with steroid injections last in June with good response. Due soon but trying to extend it to wait.  I am getting steroids injection every few months.  Do have some pain on lateral side of left foot. Prediabetic.  PERTINENT HISTORY:  RA; OA; fibromyalgia (x 10 yrs ago)  PAIN:  Are you having pain? Yes: NPRS scale: 4-5/10 - took pain medicine 1 hr prior Pain location: Rt groin and lower back Pain description: constant and variable achy LB;   Aggravating factors: house work, being up on it  Relieving factors: resting; ice/heat; tramadol ; Ibuprofen   PRECAUTIONS: None  RED FLAGS: None   WEIGHT BEARING RESTRICTIONS: No  FALLS:  Has patient fallen in last 6 months? Yes. 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  DIAGNOSTIC FINDINGS:  MRI Lumbar IMPRESSION: 1. L1-2: Development of a central to right-sided disc herniation that indents the thecal sac. Definite neural compression is not demonstrated, but this could be a cause of back pain or lumbar nerve root irritation. New since 2023. 2. L3-4: Shallow left posterolateral disc herniation with  stenosis of the left lateral recess that could possibly be symptomatic. This has  worsened since 2023. 3. L4-5: Bilateral facet arthropathy with anterolisthesis of 3 mm, worsened since 2023. 4 x 5 mm synovial cyst associated with the left facet joint projecting in the spinal canal. Stenosis of both lateral recesses that could possibly be symptomatic. This has worsened since 2023. 4. L5-S1: Disc bulge and facet osteoarthritis. No compressive stenosis.  PATIENT SURVEYS:  LEFS 32/80  COGNITION: Overall cognitive status: Within functional limits for tasks assessed     SENSATION: Numbness left last 3 digit and calf  MUSCLE LENGTH: Hamstrings: wfl tested in sitting   POSTURE: rounded shoulders, forward head, decreased lumbar lordosis, and bilateral valgus deformities knee R>L  PALPATION: TTP throughout lower thoracic and lumbar paraspinals  LUMBAR ROM:   AROM eval  Flexion full  Extension neutral  Right lateral flexion Limited 75% P!  Left lateral flexion Limited 75% P!  Right rotation   Left rotation    (Blank rows = not tested)  LOWER EXTREMITY ROM:     WFL  LOWER EXTREMITY MMT:    HD (lb) Tested in sitting Right eval Left eval  Hip flexion 31.7 34.1  Hip extension    Hip abduction 22.2 18.2  Hip adduction    Hip internal rotation    Hip external rotation    Knee flexion    Knee extension 14.3 11.8  Ankle dorsiflexion    Ankle plantarflexion    Ankle inversion    Ankle eversion     (Blank rows = not tested)  LUMBAR SPECIAL TESTS:  Slump test: Negative  FUNCTIONAL TESTS:  5 times sit to stand: 15.14 from pool bench Timed up and go (TUG): 16.11   4 stage balance: Passed 1&2. Tandem: needs assist gaining position holding x 4s; SLS unable GAIT: Distance walked: 400 ft Assistive device utilized: None Level of assistance: Complete Independence Comments: right knee valgus deformity causes hip circumduction to advance rle during swing.  Initial gait  antalgic, reduces with distance  TREATMENT  [facility] Adult PT Treatment:                                                DATE: 05/07/24 Pt seen for aquatic therapy today.  Treatment took place in water  3.5-4.75 ft in depth at the [facility] pool. Temp of water  was 91.  Pt entered/exited the pool via stairs using step to pattern with hand rail.  * straddling noodle: cycling across pool, relaxed arms - 3 laps,  UE on wall: hip abdct/ add, hip flexion/extension, knee tucks, bil clams *unsupported: walking forward/ backward (increased discomfort walking backward) * UE on wall:  straight leg hip circles CW/CCW (increased pull in R LB with Rt stance) -> squatted rest period at wall * side stepping  R/L (increased burning in Rt lateral hip moving Rt) * UE on wall: leg swings into hip abdct/ add crossing midline (varied tolerance)  * backwards marching (improved from backward walking) * plank with hands on bench in water  with mini hip ext x 3 each LE, then slow mountain climbers  * side plank (challenge) with hand on bench x 10s * TrA set with 1/2 hollow noodle pull down to thighs x 10, staggered stance x 5 each * return to suspended cycling    PATIENT EDUCATION:  Education details: intro to aquatic therapy  Person educated: Patient Education method: Explanation, demo Education comprehension: verbalized  understanding  HOME EXERCISE PROGRAM:  Aquatic to be assigned   Land HEP:  Access Code: [email or web address] URL: [email or web address] Date: 04/23/2024 Prepared by: Josette Rough   ASSESSMENT:  CLINICAL IMPRESSION: Pt reported continued increase in back pressure/discomfort with backwards gait; improved with increased step height and increased time in pool. Some burning in Rt ant hip with hip abdct and side stepping.  Dialed back intensity of session due to increased pain after last session.  Gradual reduction of Rt groin/hip pain by 2 points when suspended in deeper water   during session, but no change in numbness in bil feet.  Goals are ongoing.      EVAL: Patient is a 58 y.o. f who was seen today for physical therapy evaluation and treatment for fibromyalgia, LBP and bilateral knee pain. She presents with pain limited deficits in  knees, Lb and left foot due to above dx which effect her strength, endurance, activity tolerance, gait, balance, and functional mobility. She has steroid shots routinely in Lumbar spin and knees which have been effective in her pain management up to this point.  Her goals are to improve her strength and learn to manage her pain. Patient will benefit from skilled PT intervention and will be seen in both aquatic and land based clinic to optimize her outcomes with focus on improving all deficits.   OBJECTIVE IMPAIRMENTS: Abnormal gait, decreased activity tolerance, decreased balance, decreased mobility, decreased strength, obesity, and pain.   ACTIVITY LIMITATIONS: carrying, lifting, bending, squatting, stairs, and transfers  PARTICIPATION LIMITATIONS: cleaning, shopping, and community activity  PERSONAL FACTORS: Time since onset of injury/illness/exacerbation are also affecting patient's functional outcome.   REHAB POTENTIAL: Good  CLINICAL DECISION MAKING: Stable/uncomplicated  EVALUATION COMPLEXITY: Low   GOALS: Goals reviewed with patient? Yes  SHORT TERM GOALS: Target date: 05/15/24  Pt will tolerate full aquatic sessions consistently without increase in pain and with improving function to demonstrate good toleration and effectiveness of intervention.  Baseline: Goal status: INITIAL  2.  Pt will consider gaining pool access for use of the properties of water  for chronic conditions maintaining mobility and minimizing pain.  Baseline:  Goal status: INITIAL    LONG TERM GOALS: Target date: 06/07/24  Pt to improve on LEFS by at least 9 point to demonstrate statistically significant Improvement in function. Baseline:  32/80 Goal status: INITIAL  2.  Pt will improve on Tug test to <or= 13s (13.5 avg)  to demonstrate improvement in lower extremity function, mobility and decreased fall risk. Baseline: 16.11 Goal status: INITIAL  3.  Pt will report decrease in pain by at least 50% for improved toleration to activity/quality of life and to demonstrate improved management of pain. Baseline:  Goal status: INITIAL  4.  Pt will improve strength in knee by 5 lbs  to demonstrate improved overall physical function Baseline: see chart Goal status: INITIAL    PLAN:  PT FREQUENCY: 2x/week  PT DURATION: 8 weeks 12 visits likely alternating land and water   PLANNED INTERVENTIONS: [id number]- PT Re-evaluation, 97750- Physical Performance Testing, 97110-Therapeutic exercises, 97530- Therapeutic activity, 97112- Neuromuscular re-education, 97535- Self Care, [id number]- Manual therapy, [id number]- Gait training, [phone]- Orthotic Initial, [phone]- Aquatic Therapy, [phone]- Ionotophoresis 4mg /ml Dexamethasone , 79439 (1-2 muscles), 20561 (3+ muscles)- Dry Needling, Patient/Family education, Balance training, Stair training, Taping, Joint mobilization, DME instructions, Cryotherapy, and Moist heat.  PLAN FOR NEXT SESSION: Aquatic: core/hip/le strengthening and stretching; pain management  Land: core/hip strength, conditioning, stretching as appropriate   Delon Aquas, PTA 05/07/24 4:58 PM [place]  [facility] [address] [place], [place], [address] Phone: [phone]   Fax:  [phone]

## 2024-05-08 ENCOUNTER — Other Ambulatory Visit: Payer: Self-pay | Admitting: *Deleted

## 2024-05-08 ENCOUNTER — Other Ambulatory Visit (HOSPITAL_BASED_OUTPATIENT_CLINIC_OR_DEPARTMENT_OTHER): Payer: Self-pay

## 2024-05-08 MED ORDER — TRAMADOL HCL 50 MG PO TABS
50.0000 mg | ORAL_TABLET | ORAL | 1 refills | Status: AC | PRN
  Filled 2024-05-08: qty 180, 23d supply, fill #0

## 2024-05-08 NOTE — Telephone Encounter (Signed)
 Patient call requesting Rx tramadol  refill   Last visit 03/20/2024 Last refill 09/25/2023 #180 with 1 refill

## 2024-05-09 ENCOUNTER — Encounter (HOSPITAL_BASED_OUTPATIENT_CLINIC_OR_DEPARTMENT_OTHER): Admitting: Physical Therapy

## 2024-05-14 ENCOUNTER — Ambulatory Visit (HOSPITAL_BASED_OUTPATIENT_CLINIC_OR_DEPARTMENT_OTHER): Admitting: Physical Therapy

## 2024-05-16 ENCOUNTER — Encounter (HOSPITAL_BASED_OUTPATIENT_CLINIC_OR_DEPARTMENT_OTHER): Admitting: Physical Therapy

## 2024-05-17 DIAGNOSIS — M4316 Spondylolisthesis, lumbar region: Secondary | ICD-10-CM | POA: Diagnosis not present

## 2024-05-17 DIAGNOSIS — Z6841 Body Mass Index (BMI) 40.0 and over, adult: Secondary | ICD-10-CM | POA: Diagnosis not present

## 2024-05-21 ENCOUNTER — Ambulatory Visit (HOSPITAL_BASED_OUTPATIENT_CLINIC_OR_DEPARTMENT_OTHER): Admitting: Physical Therapy

## 2024-05-21 ENCOUNTER — Encounter (HOSPITAL_BASED_OUTPATIENT_CLINIC_OR_DEPARTMENT_OTHER): Payer: Self-pay | Admitting: Physical Therapy

## 2024-05-21 DIAGNOSIS — M25562 Pain in left knee: Secondary | ICD-10-CM | POA: Diagnosis not present

## 2024-05-21 DIAGNOSIS — M25561 Pain in right knee: Secondary | ICD-10-CM | POA: Diagnosis not present

## 2024-05-21 DIAGNOSIS — M6281 Muscle weakness (generalized): Secondary | ICD-10-CM

## 2024-05-21 DIAGNOSIS — M5459 Other low back pain: Secondary | ICD-10-CM | POA: Diagnosis not present

## 2024-05-21 DIAGNOSIS — G8929 Other chronic pain: Secondary | ICD-10-CM

## 2024-05-21 NOTE — Therapy (Signed)
 OUTPATIENT PHYSICAL THERAPY THORACOLUMBAR TREATMENT    Patient Name: Nancy [name] MRN: [id number] DOB:[birth year], 58 y.o., female Today's Date: 05/21/2024  END OF SESSION:  PT End of Session - 05/21/24 1618     Visit Number 6    Date for Recertification  06/07/24    Authorization Type aetna    PT Start Time 1616    PT Stop Time 1654    PT Time Calculation (min) 38 min    Activity Tolerance Patient tolerated treatment well    Behavior During Therapy WFL for tasks assessed/performed             Past Medical History:  Diagnosis Date   Anemia    Asthma    Bradycardia    Chicken pox    Dysrhythmia    occ. palpitations   Fibromyalgia    Headache(784.0)    Migraines    Neck pain    bulging disk   Polyarthralgia 02/21/2019   PONV (postoperative nausea and vomiting)    sometimes wakes up after surgery and cannot breath due to asthma, can be bradycardic   Rheumatoid arthritis involving multiple sites with positive rheumatoid factor (HCC) 02/21/2019   Shortness of breath    with bradycardia   Past Surgical History:  Procedure Laterality Date   ABDOMINAL HYSTERECTOMY     back procedure     nerve ablation to lower back 05/11/23, 1011/24   BACK SURGERY     cervical fusion C5,6,7 with a bulge at [facility]   BLADDER SURGERY  July 10th 2013   CESAREAN SECTION     1992 and 1995   CHOLECYSTECTOMY N/A 10/07/2021   Procedure: LAPAROSCOPIC CHOLECYSTECTOMY;  Surgeon: Vernetta Berg, MD;  Location: [facility];  Service: General;  Laterality: N/A;   COLONOSCOPY  11/27/2015   Dr.Pyrtle   CYSTOSCOPY N/A 07/31/2018   Procedure: PHYLLIS;  Surgeon: Gaston Hamilton, MD;  Location: [facility];  Service: Urology;  Laterality: N/A;   DIAGNOSTIC LAPAROSCOPY     EXCISION OF MESH N/A 07/31/2018   Procedure: REMOVAL OF VAGINAL MESH;  Surgeon: Gaston Hamilton, MD;  Location: [facility];  Service: Urology;  Laterality: N/A;   LAPAROSCOPY Bilateral 05/31/2013   Procedure:  LAPAROSCOPY withbilateral salpingo-oophorectomy, lysis of adhesions;  Surgeon: Lynwood FORBES Curlene PONCE, MD;  Location: [facility];  Service: Gynecology;  Laterality: Bilateral;  with clippings of Vaginal  mesh   SALPINGOOPHORECTOMY Bilateral 05/31/2013   Procedure: BILATERAL SALPINGO OOPHORECTOMY/TRIM MESH;  Surgeon: Lynwood FORBES Curlene PONCE, MD;  Location: [facility];  Service: Gynecology;  Laterality: Bilateral;   TUBAL LIGATION     1995   VAGINAL HYSTERECTOMY     1998 or 1997   WRIST FRACTURE SURGERY  2005   Patient Active Problem List   Diagnosis Date Noted   Lumbar radiculopathy 03/16/2023   Chronic pain 03/16/2023   Asthma 03/07/2022   Immunosuppressed status 12/26/2019   Hyperlipidemia 12/25/2019   Rheumatoid arthritis involving multiple sites with positive rheumatoid factor (HCC) 02/21/2019   Vitamin B12 deficiency 02/14/2019   Urge incontinence 12/20/2018   Urinary frequency 12/20/2018   Fibromyalgia 04/12/2018   Vitamin D  deficiency 04/12/2018   Vaginal dryness 04/12/2018   History of total hysterectomy 04/12/2018   Insomnia 04/12/2018   Morbid obesity (HCC) 04/12/2018   History of colon polyps 04/12/2018   Primary osteoarthritis of both knees 12/28/2017   Cervicogenic headache 03/26/2014   Cervical radiculopathy 03/26/2014   Mild intermittent asthma 11/20/2013   Bradycardia 07/04/2012    PCP:  Katrinka Garnette KIDD, MD   REFERRING PROVIDER: Katrinka Garnette KIDD, MD   REFERRING DIAG:  [phone] (ICD-10-CM) - Lumbar radiculopathy  M79.7 (ICD-10-CM) - Fibromyalgia  M17.0 (ICD-10-CM) - Primary osteoarthritis of both knees    Rationale for Evaluation and Treatment: Rehabilitation  THERAPY DIAG:  Other low back pain  Chronic pain of both knees  Muscle weakness (generalized)  ONSET DATE: exacerbated last year  SUBJECTIVE:                                                                                                                                                                                            SUBJECTIVE STATEMENT: Pt reports scheduled epidural for L5-S1 next week. Pain 4/10  POOL ACCESS: currently none, but my look into [facility].    Eval: Chronic LBP had epidural early July then facets done a few weeks later.  Epidural knocked out LB and left hip pain and the radiating pain down legs.  Knees oa with steroid injections last in June with good response. Due soon but trying to extend it to wait.  I am getting steroids injection every few months.  Do have some pain on lateral side of left foot. Prediabetic.  PERTINENT HISTORY:  RA; OA; fibromyalgia (x 10 yrs ago)  PAIN:  Are you having pain? Yes: NPRS scale: 4-5/10 - took pain medicine 1 hr prior Pain location: Rt groin and lower back Pain description: constant and variable achy LB;   Aggravating factors: house work, being up on it  Relieving factors: resting; ice/heat; tramadol ; Ibuprofen   PRECAUTIONS: None  RED FLAGS: None   WEIGHT BEARING RESTRICTIONS: No  FALLS:  Has patient fallen in last 6 months? Yes. 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  DIAGNOSTIC FINDINGS:  MRI Lumbar IMPRESSION: 1. L1-2: Development of a central to right-sided disc herniation that indents the thecal sac. Definite neural compression is not demonstrated, but this could be a cause of back pain or lumbar nerve root irritation. New since 2023. 2. L3-4: Shallow left posterolateral disc herniation with stenosis of the left lateral recess that could possibly be symptomatic. This has worsened since 2023. 3. L4-5:  Bilateral facet arthropathy with anterolisthesis of 3 mm, worsened since 2023. 4 x  5 mm synovial cyst associated with the left facet joint projecting in the spinal canal. Stenosis of both lateral recesses that could possibly be symptomatic. This has worsened since 2023. 4. L5-S1: Disc bulge and facet osteoarthritis. No compressive stenosis.  PATIENT SURVEYS:  LEFS 32/80  COGNITION: Overall cognitive status: Within functional limits for tasks assessed     SENSATION: Numbness left last 3 digit and calf  MUSCLE LENGTH: Hamstrings: wfl tested in sitting   POSTURE: rounded shoulders, forward head, decreased lumbar lordosis, and bilateral valgus deformities knee R>L  PALPATION: TTP throughout lower thoracic and lumbar paraspinals  LUMBAR ROM:   AROM eval  Flexion full  Extension neutral  Right lateral flexion Limited 75% P!  Left lateral flexion Limited 75% P!  Right rotation   Left rotation    (Blank rows = not tested)  LOWER EXTREMITY ROM:     WFL  LOWER EXTREMITY MMT:    HD (lb) Tested in sitting Right eval Left eval  Hip flexion 31.7 34.1  Hip extension    Hip abduction 22.2 18.2  Hip adduction    Hip internal rotation    Hip external rotation    Knee flexion    Knee extension 14.3 11.8  Ankle dorsiflexion    Ankle plantarflexion    Ankle inversion    Ankle eversion     (Blank rows = not tested)  LUMBAR SPECIAL TESTS:  Slump test: Negative  FUNCTIONAL TESTS:  5 times sit to stand: 15.14 from pool bench Timed up and go (TUG): 16.11   4 stage balance: Passed 1&2. Tandem: needs assist gaining position holding x 4s; SLS unable GAIT: Distance walked: 400 ft Assistive device utilized: None Level of assistance: Complete Independence Comments: right knee valgus deformity causes hip circumduction to advance rle during swing.  Initial gait antalgic, reduces with distance  TREATMENT  [facility] Adult PT Treatment:                                                 DATE: 05/21/24 Pt seen for aquatic therapy today.  Treatment took place in water  3.5-4.75 ft in depth at the [facility] pool. Temp of water  was 91.  Pt entered/exited the pool via stairs using step to pattern with hand rail.   *unsupported: walking forward/ backward (increased discomfort walking backward) * side stepping  R/L  * UE on wall:  straight leg hip circles CW/CCW; squats  * TrA set with 1/2 hollow noodle pull down to thighs x 10, staggered stance x 10 each * plank with hands on bench in water  with  hip ext x 5 each LE; slow mountain climbers x4; fire hydrants x 5 * straddling noodle: cycling across pool, relaxed arms ->noodle wrapped posteriorly then anteriorly;  UE on wall: hip abdct/ add, hip flexion/extension, knee tucks, bil clams    PATIENT EDUCATION:  Education details: intro to aquatic therapy  Person educated: Patient Education method: Programmer, multimedia, demo Education comprehension: verbalized understanding  HOME EXERCISE PROGRAM:  Aquatic to be assigned   Land HEP:  Access Code: [email or web address] URL: [email or web address] Date: 04/23/2024 Prepared by: Josette Rough   ASSESSMENT:  CLINICAL IMPRESSION: Next session land. Plan to review/add/modify HEP.  Long term toleration to land based therapy unlikely due to chronicity of joint pain. If time allows complete TUG. She is scheduled for a L5-S1 epidural next week.  Anticipate dc at end of cert.  She is wait listed for final week of cert. Pt instructed on varied positioning of noodle for decompression with explanation of intension. She demonstrates safety and indep with it in each position. She reports reduction of pain with aquatic intervention and no excessive fatigue post session. Plan to instruct and issue final aquatic HEP prior to dc.       EVAL: Patient is a 58 y.o. f who was seen today for physical therapy evaluation and treatment for fibromyalgia, LBP and bilateral knee pain. She presents  with pain limited deficits in  knees, Lb and left foot due to above dx which effect her strength, endurance, activity tolerance, gait, balance, and functional mobility. She has steroid shots routinely in Lumbar spin and knees which have been effective in her pain management up to this point.  Her goals are to improve her strength and learn to manage her pain. Patient will benefit from skilled PT intervention and will be seen in both aquatic and land based clinic to optimize her outcomes with focus on improving all deficits.   OBJECTIVE IMPAIRMENTS: Abnormal gait, decreased activity tolerance, decreased balance, decreased mobility, decreased strength, obesity, and pain.   ACTIVITY LIMITATIONS: carrying, lifting, bending, squatting, stairs, and transfers  PARTICIPATION LIMITATIONS: cleaning, shopping, and community activity  PERSONAL FACTORS: Time since onset of injury/illness/exacerbation are also affecting patient's functional outcome.   REHAB POTENTIAL: Good  CLINICAL DECISION MAKING: Stable/uncomplicated  EVALUATION COMPLEXITY: Low   GOALS: Goals reviewed with patient? Yes  SHORT TERM GOALS: Target date: 05/15/24  Pt will tolerate full aquatic sessions consistently without increase in pain and with improving function to demonstrate good toleration and effectiveness of intervention.  Baseline: Goal status: Met 05/21/24  2.  Pt will consider gaining pool access for use of the properties of water  for chronic conditions maintaining mobility and minimizing pain.  Baseline:  Goal status: In progress 05/21/24    LONG TERM GOALS: Target date: 06/07/24  Pt to improve on LEFS by at least 9 point to demonstrate statistically significant Improvement in function. Baseline: 32/80 Goal status: INITIAL  2.  Pt will improve on Tug test to <or= 13s (13.5 avg)  to demonstrate improvement in lower extremity function, mobility and decreased fall risk. Baseline: 16.11 Goal status: INITIAL  3.   Pt will report decrease in pain by at least 50% for improved toleration to activity/quality of life and to demonstrate improved management of pain. Baseline:  Goal status: INITIAL  4.  Pt will improve strength in knee by 5 lbs  to demonstrate improved overall physical function Baseline: see chart Goal status: INITIAL    PLAN:  PT FREQUENCY: 2x/week  PT DURATION: 8 weeks 12 visits likely alternating land and water   PLANNED INTERVENTIONS: [id number]- PT Re-evaluation, 97750- Physical Performance Testing, 97110-Therapeutic exercises, 97530- Therapeutic activity, 97112- Neuromuscular re-education, 97535- Self Care, [id number]- Manual therapy, [id number]- Gait training, [phone]- Orthotic Initial, [phone]- Aquatic Therapy, [phone]- Ionotophoresis 4mg /ml Dexamethasone , 79439 (1-2 muscles), 20561 (3+ muscles)- Dry Needling, Patient/Family education, Balance training, Stair training, Taping, Joint mobilization, DME instructions, Cryotherapy, and Moist heat.  PLAN FOR NEXT SESSION: Aquatic: core/hip/le strengthening and stretching; pain management  Land: core/hip strength, conditioning, stretching as appropriate   Ronal Foots) Yanely Mast MPT 05/21/24 4:51 PM [facility] [address] [place], [place], [address] Phone: [phone]   Fax:  [phone]

## 2024-05-23 ENCOUNTER — Ambulatory Visit (HOSPITAL_BASED_OUTPATIENT_CLINIC_OR_DEPARTMENT_OTHER): Admitting: Physical Therapy

## 2024-05-23 DIAGNOSIS — M5459 Other low back pain: Secondary | ICD-10-CM

## 2024-05-23 DIAGNOSIS — M6281 Muscle weakness (generalized): Secondary | ICD-10-CM | POA: Diagnosis not present

## 2024-05-23 DIAGNOSIS — G8929 Other chronic pain: Secondary | ICD-10-CM

## 2024-05-23 DIAGNOSIS — M25561 Pain in right knee: Secondary | ICD-10-CM | POA: Diagnosis not present

## 2024-05-23 DIAGNOSIS — M25562 Pain in left knee: Secondary | ICD-10-CM | POA: Diagnosis not present

## 2024-05-23 NOTE — Therapy (Signed)
 OUTPATIENT PHYSICAL THERAPY THORACOLUMBAR TREATMENT    Patient Name: Nancy [name] MRN: [id number] DOB:1965-11-11, 58 y.o., female Today's Date: 05/24/2024  END OF SESSION:  PT End of Session - 05/23/24 1659     Visit Number 7    Date for Recertification  06/07/24    Authorization Type aetna    PT Start Time 1625    PT Stop Time 1713    PT Time Calculation (min) 48 min    Activity Tolerance Patient tolerated treatment well    Behavior During Therapy [facility] for tasks assessed/performed              Past Medical History:  Diagnosis Date   Anemia    Asthma    Bradycardia    Chicken pox    Dysrhythmia    occ. palpitations   Fibromyalgia    Headache(784.0)    Migraines    Neck pain    bulging disk   Polyarthralgia 02/21/2019   PONV (postoperative nausea and vomiting)    sometimes wakes up after surgery and cannot breath due to asthma, can be bradycardic   Rheumatoid arthritis involving multiple sites with positive rheumatoid factor (HCC) 02/21/2019   Shortness of breath    with bradycardia   Past Surgical History:  Procedure Laterality Date   ABDOMINAL HYSTERECTOMY     back procedure     nerve ablation to lower back 05/11/23, 1011/24   BACK SURGERY     cervical fusion C5,6,7 with a bulge at [facility]   BLADDER SURGERY  July 10th 2013   CESAREAN SECTION     1992 and 1995   CHOLECYSTECTOMY N/A 10/07/2021   Procedure: LAPAROSCOPIC CHOLECYSTECTOMY;  Surgeon: Vernetta Berg, MD;  Location: [facility];  Service: General;  Laterality: N/A;   COLONOSCOPY  11/27/2015   Dr.Pyrtle   CYSTOSCOPY N/A 07/31/2018   Procedure: PHYLLIS;  Surgeon: Gaston Hamilton, MD;  Location: [facility];  Service: Urology;  Laterality: N/A;   DIAGNOSTIC LAPAROSCOPY     EXCISION OF MESH N/A 07/31/2018   Procedure: REMOVAL OF VAGINAL MESH;  Surgeon: Gaston Hamilton, MD;  Location: [facility];  Service: Urology;  Laterality: N/A;   LAPAROSCOPY Bilateral 05/31/2013   Procedure:  LAPAROSCOPY withbilateral salpingo-oophorectomy, lysis of adhesions;  Surgeon: Lynwood FORBES Curlene PONCE, MD;  Location: [facility];  Service: Gynecology;  Laterality: Bilateral;  with clippings of Vaginal  mesh   SALPINGOOPHORECTOMY Bilateral 05/31/2013   Procedure: BILATERAL SALPINGO OOPHORECTOMY/TRIM MESH;  Surgeon: Lynwood FORBES Curlene PONCE, MD;  Location: [facility];  Service: Gynecology;  Laterality: Bilateral;   TUBAL LIGATION     1995   VAGINAL HYSTERECTOMY     1998 or 1997   WRIST FRACTURE SURGERY  2005   Patient Active Problem List   Diagnosis Date Noted   Lumbar radiculopathy 03/16/2023   Chronic pain 03/16/2023   Asthma 03/07/2022   Immunosuppressed status 12/26/2019   Hyperlipidemia 12/25/2019   Rheumatoid arthritis involving multiple sites with positive rheumatoid factor (HCC) 02/21/2019   Vitamin B12 deficiency 02/14/2019   Urge incontinence 12/20/2018   Urinary frequency 12/20/2018   Fibromyalgia 04/12/2018   Vitamin D  deficiency 04/12/2018   Vaginal dryness 04/12/2018   History of total hysterectomy 04/12/2018   Insomnia 04/12/2018   Morbid obesity (HCC) 04/12/2018   History of colon polyps 04/12/2018   Primary osteoarthritis of both knees 12/28/2017   Cervicogenic headache 03/26/2014   Cervical radiculopathy 03/26/2014   Mild intermittent asthma 11/20/2013   Bradycardia 07/04/2012  PCP: Katrinka Garnette KIDD, MD   REFERRING PROVIDER: Katrinka Garnette KIDD, MD   REFERRING DIAG:  [phone] (ICD-10-CM) - Lumbar radiculopathy  M79.7 (ICD-10-CM) - Fibromyalgia  M17.0 (ICD-10-CM) - Primary osteoarthritis of both knees    Rationale for Evaluation and Treatment: Rehabilitation  THERAPY DIAG:  Other low back pain  Chronic pain of both knees  Muscle weakness (generalized)  ONSET DATE: exacerbated last year  SUBJECTIVE:                                                                                                                                                                                            SUBJECTIVE STATEMENT: Pt is scheduled for an epidural injection on Tuesday, 10/28.  Pt states she didn't feel too bad after prior aquatic therapy.  Pt's R hip felt better after prior aquatic treatment, though the pain returned.  Pt states aquatic therapy is helping.  Pt's legs were aching when leaving work yesterday.  Pt has not performed her land based HEP.  Pt reports having increased pain after prior land based treatment.     POOL ACCESS: currently none, but may look into [facility].    Eval: Chronic LBP had epidural early July then facets done a few weeks later.  Epidural knocked out LB and left hip pain and the radiating pain down legs.  Knees oa with steroid injections last in June with good response. Due soon but trying to extend it to wait.  I am getting steroids injection every few months.  Do have some pain on lateral side of left foot. Prediabetic.  PERTINENT HISTORY:  Lumbar spondylolisthesis RA; OA; fibromyalgia (x 10 yrs ago), cervical fusion 2017  PAIN:  Are you having pain? Yes: NPRS scale: 4/10 - took pain medicine approx 45 min prior Pain location: Rt groin, glute, lateral hip, and lower back Pain description: constant and variable achy LB;   Aggravating factors: house work, being up on it  Relieving factors: resting; ice/heat; tramadol ; Ibuprofen   PRECAUTIONS: None  RED FLAGS: None   WEIGHT BEARING RESTRICTIONS: No  FALLS:  Has patient fallen in last 6 months? Yes. 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  DIAGNOSTIC FINDINGS:  MRI  Lumbar IMPRESSION: 1. L1-2:  Development of a central to right-sided disc herniation that indents the thecal sac. Definite neural compression is not demonstrated, but this could be a cause of back pain or lumbar nerve root irritation. New since 2023. 2. L3-4: Shallow left posterolateral disc herniation with stenosis of the left lateral recess that could possibly be symptomatic. This has worsened since 2023. 3. L4-5: Bilateral facet arthropathy with anterolisthesis of 3 mm, worsened since 2023. 4 x 5 mm synovial cyst associated with the left facet joint projecting in the spinal canal. Stenosis of both lateral recesses that could possibly be symptomatic. This has worsened since 2023. 4. L5-S1: Disc bulge and facet osteoarthritis. No compressive stenosis.  PATIENT SURVEYS:  LEFS 32/80  COGNITION: Overall cognitive status: Within functional limits for tasks assessed     SENSATION: Numbness left last 3 digit and calf  MUSCLE LENGTH: Hamstrings: wfl tested in sitting   POSTURE: rounded shoulders, forward head, decreased lumbar lordosis, and bilateral valgus deformities knee R>L  PALPATION: TTP throughout lower thoracic and lumbar paraspinals  LUMBAR ROM:   AROM eval  Flexion full  Extension neutral  Right lateral flexion Limited 75% P!  Left lateral flexion Limited 75% P!  Right rotation   Left rotation    (Blank rows = not tested)  LOWER EXTREMITY ROM:     WFL  LOWER EXTREMITY MMT:    HD (lb) Tested in sitting Right eval Left eval  Hip flexion 31.7 34.1  Hip extension    Hip abduction 22.2 18.2  Hip adduction    Hip internal rotation    Hip external rotation    Knee flexion    Knee extension 14.3 11.8  Ankle dorsiflexion    Ankle plantarflexion    Ankle inversion    Ankle eversion     (Blank rows = not tested)  LUMBAR SPECIAL TESTS:  Slump test: Negative  FUNCTIONAL TESTS:  5 times sit to stand: 15.14 from pool bench Timed up and go (TUG):  16.11   4 stage balance: Passed 1&2. Tandem: needs assist gaining position holding x 4s; SLS unable GAIT: Distance walked: 400 ft Assistive device utilized: None Level of assistance: Complete Independence Comments: right knee valgus deformity causes hip circumduction to advance rle during swing.  Initial gait antalgic, reduces with distance  TREATMENT  05/23/24 Reviewed pt presentation, response to prior Rx, HEP compliance, and pain level.  PT educated pt in correct performance and palpation of TrA contraction.  Pt performed TrA contractions with and without 5 sec hold.  Supine bridge with TrA Supine PPT x 5 reps Supine clams with RTB with TrA 2x10 Supine heel slides with TrA x 10 each Supine manual HS stretch 2x30 sec bilat  Manual Therapy:  gentle STM and TPR to bilat lumbar paraspinals and R glute in prone   [facility] Adult PT Treatment:                                                DATE: 05/21/24 Pt seen for aquatic therapy today.  Treatment took place in water  3.5-4.75 ft in depth at the [facility] pool. Temp of water  was 91.  Pt entered/exited the pool via stairs using step to pattern with hand rail.   *unsupported: walking forward/ backward (increased discomfort walking backward) * side stepping  R/L  * UE on wall:  straight leg hip circles  CW/CCW; squats  * TrA set with 1/2 hollow noodle pull down to thighs x 10, staggered stance x 10 each * plank with hands on bench in water  with  hip ext x 5 each LE; slow mountain climbers x4; fire hydrants x 5 * straddling noodle: cycling across pool, relaxed arms ->noodle wrapped posteriorly then anteriorly;  UE on wall: hip abdct/ add, hip flexion/extension, knee tucks, bil clams    PATIENT EDUCATION:  Education details: exercise form, POC, and rationale of exercises Person educated: Patient Education method: Explanation, demonstration, verbal cues Education comprehension: verbalized understanding, returned demonstration,  verbal cues required  HOME EXERCISE PROGRAM:  Aquatic to be assigned   Land HEP:  Access Code: [email or web address] URL: [email or web address] Date: 04/23/2024 Prepared by: Josette Rough   ASSESSMENT:  CLINICAL IMPRESSION: Pt presents to treatment reporting 4/10 pain though states she took medication 45 mins prior.  PT performed exercises focused on improving core strength and lumbopelvic stabilization.  PT instructed pt in correct form with exercises.  She has difficulty with bridges having limited height with bridge.  Pt has pain with PPT and PT had pt stop that exercise.  PT performed gentle STM and TPR to bilat lumbar paraspinals and R glute and she was very tender and sensitive with soft tissue work.  Pt is scheduled for an epidural injection on Tuesday.  Pt reports no increased pain after treatment.    OBJECTIVE IMPAIRMENTS: Abnormal gait, decreased activity tolerance, decreased balance, decreased mobility, decreased strength, obesity, and pain.   ACTIVITY LIMITATIONS: carrying, lifting, bending, squatting, stairs, and transfers  PARTICIPATION LIMITATIONS: cleaning, shopping, and community activity  PERSONAL FACTORS: Time since onset of injury/illness/exacerbation are also affecting patient's functional outcome.   REHAB POTENTIAL: Good  CLINICAL DECISION MAKING: Stable/uncomplicated  EVALUATION COMPLEXITY: Low   GOALS: Goals reviewed with patient? Yes  SHORT TERM GOALS: Target date: 05/15/24  Pt will tolerate full aquatic sessions consistently without increase in pain and with improving function to demonstrate good toleration and effectiveness of intervention.  Baseline: Goal status: Met 05/21/24  2.  Pt will consider gaining pool access for use of the properties of water  for chronic conditions maintaining mobility and minimizing pain.  Baseline:  Goal status: In progress 05/21/24    LONG TERM GOALS: Target date: 06/07/24  Pt to improve on LEFS by at least  9 point to demonstrate statistically significant Improvement in function. Baseline: 32/80 Goal status: INITIAL  2.  Pt will improve on Tug test to <or= 13s (13.5 avg)  to demonstrate improvement in lower extremity function, mobility and decreased fall risk. Baseline: 16.11 Goal status: INITIAL  3.  Pt will report decrease in pain by at least 50% for improved toleration to activity/quality of life and to demonstrate improved management of pain. Baseline:  Goal status: INITIAL  4.  Pt will improve strength in knee by 5 lbs  to demonstrate improved overall physical function Baseline: see chart Goal status: INITIAL    PLAN:  PT FREQUENCY: 2x/week  PT DURATION: 8 weeks 12 visits likely alternating land and water   PLANNED INTERVENTIONS: [id number]- PT Re-evaluation, 97750- Physical Performance Testing, 97110-Therapeutic exercises, 97530- Therapeutic activity, 97112- Neuromuscular re-education, 97535- Self Care, [id number]- Manual therapy, [id number]- Gait training, [phone]- Orthotic Initial, [phone]- Aquatic Therapy, [phone]- Ionotophoresis 4mg /ml Dexamethasone , 79439 (1-2 muscles), 20561 (3+ muscles)- Dry Needling, Patient/Family education, Balance training, Stair training, Taping, Joint mobilization, DME instructions, Cryotherapy, and Moist heat.  PLAN FOR NEXT SESSION: Aquatic: core/hip/le strengthening and stretching; pain management  Land: core/hip strength,  conditioning, stretching as appropriate   Leigh Minerva III PT, DPT 05/24/24 5:44 PM  [facility] [address] [place], [place], [address] Phone: [phone]   Fax:  [phone]

## 2024-05-24 ENCOUNTER — Encounter (HOSPITAL_BASED_OUTPATIENT_CLINIC_OR_DEPARTMENT_OTHER): Payer: Self-pay | Admitting: Physical Therapy

## 2024-05-28 DIAGNOSIS — M5416 Radiculopathy, lumbar region: Secondary | ICD-10-CM | POA: Diagnosis not present

## 2024-05-28 DIAGNOSIS — M5116 Intervertebral disc disorders with radiculopathy, lumbar region: Secondary | ICD-10-CM | POA: Diagnosis not present

## 2024-05-30 DIAGNOSIS — M0579 Rheumatoid arthritis with rheumatoid factor of multiple sites without organ or systems involvement: Secondary | ICD-10-CM | POA: Diagnosis not present

## 2024-06-03 ENCOUNTER — Encounter: Payer: Self-pay | Admitting: Radiology

## 2024-06-07 ENCOUNTER — Ambulatory Visit (HOSPITAL_BASED_OUTPATIENT_CLINIC_OR_DEPARTMENT_OTHER): Payer: Self-pay | Attending: Family Medicine | Admitting: Physical Therapy

## 2024-06-07 ENCOUNTER — Encounter (HOSPITAL_BASED_OUTPATIENT_CLINIC_OR_DEPARTMENT_OTHER): Payer: Self-pay | Admitting: Physical Therapy

## 2024-06-07 DIAGNOSIS — M25561 Pain in right knee: Secondary | ICD-10-CM | POA: Diagnosis not present

## 2024-06-07 DIAGNOSIS — M6281 Muscle weakness (generalized): Secondary | ICD-10-CM | POA: Diagnosis not present

## 2024-06-07 DIAGNOSIS — M5459 Other low back pain: Secondary | ICD-10-CM | POA: Diagnosis not present

## 2024-06-07 DIAGNOSIS — M5410 Radiculopathy, site unspecified: Secondary | ICD-10-CM | POA: Diagnosis not present

## 2024-06-07 DIAGNOSIS — M25562 Pain in left knee: Secondary | ICD-10-CM | POA: Diagnosis not present

## 2024-06-07 DIAGNOSIS — G8929 Other chronic pain: Secondary | ICD-10-CM | POA: Diagnosis not present

## 2024-06-07 NOTE — Therapy (Signed)
 OUTPATIENT PHYSICAL THERAPY THORACOLUMBAR TREATMENT    Patient Name: Nancy [name] MRN: [id number] DOB:Mar 24, 1966, 58 y.o., female Today's Date: 06/07/2024  END OF SESSION:  PT End of Session - 06/07/24 1518     Visit Number 8    Date for Recertification  06/07/24    Authorization Type aetna    PT Start Time 1515    PT Stop Time 1554    PT Time Calculation (min) 39 min    Behavior During Therapy [facility] for tasks assessed/performed              Past Medical History:  Diagnosis Date   Anemia    Asthma    Bradycardia    Chicken pox    Dysrhythmia    occ. palpitations   Fibromyalgia    Headache(784.0)    Migraines    Neck pain    bulging disk   Polyarthralgia 02/21/2019   PONV (postoperative nausea and vomiting)    sometimes wakes up after surgery and cannot breath due to asthma, can be bradycardic   Rheumatoid arthritis involving multiple sites with positive rheumatoid factor (HCC) 02/21/2019   Shortness of breath    with bradycardia   Past Surgical History:  Procedure Laterality Date   ABDOMINAL HYSTERECTOMY     back procedure     nerve ablation to lower back 05/11/23, 1011/24   BACK SURGERY     cervical fusion C5,6,7 with a bulge at [facility]   BLADDER SURGERY  July 10th 2013   CESAREAN SECTION     1992 and 1995   CHOLECYSTECTOMY N/A 10/07/2021   Procedure: LAPAROSCOPIC CHOLECYSTECTOMY;  Surgeon: Vernetta Berg, MD;  Location: [facility];  Service: General;  Laterality: N/A;   COLONOSCOPY  11/27/2015   Dr.Pyrtle   CYSTOSCOPY N/A 07/31/2018   Procedure: PHYLLIS;  Surgeon: Gaston Hamilton, MD;  Location: [facility];  Service: Urology;  Laterality: N/A;   DIAGNOSTIC LAPAROSCOPY     EXCISION OF MESH N/A 07/31/2018   Procedure: REMOVAL OF VAGINAL MESH;  Surgeon: Gaston Hamilton, MD;  Location: [facility];  Service: Urology;  Laterality: N/A;   LAPAROSCOPY Bilateral 05/31/2013   Procedure: LAPAROSCOPY withbilateral salpingo-oophorectomy, lysis  of adhesions;  Surgeon: Lynwood FORBES Curlene PONCE, MD;  Location: [facility];  Service: Gynecology;  Laterality: Bilateral;  with clippings of Vaginal  mesh   SALPINGOOPHORECTOMY Bilateral 05/31/2013   Procedure: BILATERAL SALPINGO OOPHORECTOMY/TRIM MESH;  Surgeon: Lynwood FORBES Curlene PONCE, MD;  Location: [facility];  Service: Gynecology;  Laterality: Bilateral;   TUBAL LIGATION     1995   VAGINAL HYSTERECTOMY     1998 or 1997   WRIST FRACTURE SURGERY  2005   Patient Active Problem List   Diagnosis Date Noted   Lumbar radiculopathy 03/16/2023   Chronic pain 03/16/2023   Asthma 03/07/2022   Immunosuppressed status 12/26/2019   Hyperlipidemia 12/25/2019   Rheumatoid arthritis involving multiple sites with positive rheumatoid factor (HCC) 02/21/2019   Vitamin B12 deficiency 02/14/2019   Urge incontinence 12/20/2018   Urinary frequency 12/20/2018   Fibromyalgia 04/12/2018   Vitamin D  deficiency 04/12/2018   Vaginal dryness 04/12/2018   History of total hysterectomy 04/12/2018   Insomnia 04/12/2018   Morbid obesity (HCC) 04/12/2018   History of colon polyps 04/12/2018   Primary osteoarthritis of both knees 12/28/2017   Cervicogenic headache 03/26/2014   Cervical radiculopathy 03/26/2014   Mild intermittent asthma 11/20/2013   Bradycardia 07/04/2012    PCP: Katrinka Garnette KIDD, MD   REFERRING PROVIDER:  Katrinka Garnette KIDD, MD   REFERRING DIAG:  [phone] (ICD-10-CM) - Lumbar radiculopathy  M79.7 (ICD-10-CM) - Fibromyalgia  M17.0 (ICD-10-CM) - Primary osteoarthritis of both knees    Rationale for Evaluation and Treatment: Rehabilitation  THERAPY DIAG:  Other low back pain  Chronic pain of both knees  Muscle weakness (generalized)  ONSET DATE: exacerbated last year  SUBJECTIVE:                                                                                                                                                                                           SUBJECTIVE STATEMENT: Pt reports  she had epidural injection on Tuesday, 10/28; relief for 2 days when off of feet. Pt states that once she returned to work, pain returned.  Pt has not performed her land based HEP.  Pt reports the STM to hip and back last land session was helpful.   POOL ACCESS: currently none, but may look into [facility].    Eval: Chronic LBP had epidural early July then facets done a few weeks later.  Epidural knocked out LB and left hip pain and the radiating pain down legs.  Knees oa with steroid injections last in June with good response. Due soon but trying to extend it to wait.  I am getting steroids injection every few months.  Do have some pain on lateral side of left foot. Prediabetic.  PERTINENT HISTORY:  Lumbar spondylolisthesis RA; OA; fibromyalgia (x 10 yrs ago), cervical fusion 2017  PAIN:  Are you having pain? Yes: NPRS scale: 4/10 - took pain medicine approx 45 min prior Pain location: Rt groin, glute, lateral hip, and lower back, radiating down LE Pain description: constant and variable achy LB;   Aggravating factors: house work, being up on it  Relieving factors: resting; ice/heat; tramadol ; Ibuprofen   PRECAUTIONS: None  RED FLAGS: None   WEIGHT BEARING RESTRICTIONS: No  FALLS:  Has patient fallen in last 6 months? Yes. 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  DIAGNOSTIC FINDINGS:  MRI Lumbar IMPRESSION: 1. L1-2: Development of a central to right-sided disc herniation that indents the thecal sac. Definite neural compression is not demonstrated, but this could be a  cause of back pain or lumbar  nerve root irritation. New since 2023. 2. L3-4: Shallow left posterolateral disc herniation with stenosis of the left lateral recess that could possibly be symptomatic. This has worsened since 2023. 3. L4-5: Bilateral facet arthropathy with anterolisthesis of 3 mm, worsened since 2023. 4 x 5 mm synovial cyst associated with the left facet joint projecting in the spinal canal. Stenosis of both lateral recesses that could possibly be symptomatic. This has worsened since 2023. 4. L5-S1: Disc bulge and facet osteoarthritis. No compressive stenosis.  PATIENT SURVEYS:  LEFS 32/80  COGNITION: Overall cognitive status: Within functional limits for tasks assessed     SENSATION: Numbness left last 3 digit and calf  MUSCLE LENGTH: Hamstrings: wfl tested in sitting   POSTURE: rounded shoulders, forward head, decreased lumbar lordosis, and bilateral valgus deformities knee R>L  PALPATION: TTP throughout lower thoracic and lumbar paraspinals  LUMBAR ROM:   AROM eval  Flexion full  Extension neutral  Right lateral flexion Limited 75% P!  Left lateral flexion Limited 75% P!  Right rotation   Left rotation    (Blank rows = not tested)  LOWER EXTREMITY ROM:     WFL  LOWER EXTREMITY MMT:    HD (lb) Tested in sitting Right eval Left eval  Hip flexion 31.7 34.1  Hip extension    Hip abduction 22.2 18.2  Hip adduction    Hip internal rotation    Hip external rotation    Knee flexion    Knee extension 14.3 11.8  Ankle dorsiflexion    Ankle plantarflexion    Ankle inversion    Ankle eversion     (Blank rows = not tested)  LUMBAR SPECIAL TESTS:  Slump test: Negative  FUNCTIONAL TESTS:  5 times sit to stand: 15.14 from pool bench Timed up and go (TUG): 16.11   4 stage balance: Passed 1&2. Tandem: needs assist gaining position holding x 4s; SLS unable GAIT: Distance walked: 400 ft Assistive device utilized: None Level of assistance:  Complete Independence Comments: right knee valgus deformity causes hip circumduction to advance rle during swing.  Initial gait antalgic, reduces with distance  TREATMENT  [facility] Adult PT Treatment:                                                DATE: 06/07/24 Pt seen for aquatic therapy today.  Treatment took place in water  3.5-4.75 ft in depth at the [facility] pool. Temp of water  was 91.  Pt entered/exited the pool via stairs using step to pattern with hand rail.  *  noodle wrapped posteriorly for decompression->straddling noodle: cycling across pool, relaxed arms->  UE on wall: hip abdct/ add, hip flexion/extension *unsupported: walking forward/ backward/ side stepping (pain in Rt with Lt side stepping)  * UE on wall:  straight leg hip circles CW/CCW; hip abdct/ add; squats; single leg clams * TrA set with 1/2 hollow noodle pull down to thighs x 10,  * plank with hands on yellow hand floats-> on stairs in water  with  hip ext x 5 each LE; slow mountain climbers x4 * side plank on stairs -> starfish x 5 each  * tall kneeling on noodle with UE on wall for core engagement and balance challenge  05/23/24 Reviewed pt presentation, response to prior Rx, HEP compliance, and pain level.  PT educated pt in correct performance and palpation of  TrA contraction.  Pt performed TrA contractions with and without 5 sec hold.  Supine bridge with TrA Supine PPT x 5 reps Supine clams with RTB with TrA 2x10 Supine heel slides with TrA x 10 each Supine manual HS stretch 2x30 sec bilat  Manual Therapy:  gentle STM and TPR to bilat lumbar paraspinals and R glute in prone      PATIENT EDUCATION:  Education details: exercise form, rationale of exercises Person educated: Patient Education method: Explanation, demonstration, verbal cues Education comprehension: verbalized understanding, returned demonstration, verbal cues required  HOME EXERCISE PROGRAM: AQUATIC Access Code: [email or web address] URL:  [email or web address] Date: 06/07/2024  * not issued yet Prepared by: [facility] This aquatic home exercise program from [employer] utilizes pictures from land based exercises, but has been adapted prior to lamination and issuance.    Land HEP: Access Code: [email or web address] URL: [email or web address] Date: 04/23/2024 Prepared by: Josette Rough   ASSESSMENT:  CLINICAL IMPRESSION: Pt reported no change in symptoms when in water .  She tolerated all exercises well.  Aquatic HEP created, but not issued yet.  Will plan to issue at next scheduled pool visit.  Therapist to assess goals for recert (POC ends today).      OBJECTIVE IMPAIRMENTS: Abnormal gait, decreased activity tolerance, decreased balance, decreased mobility, decreased strength, obesity, and pain.   ACTIVITY LIMITATIONS: carrying, lifting, bending, squatting, stairs, and transfers  PARTICIPATION LIMITATIONS: cleaning, shopping, and community activity  PERSONAL FACTORS: Time since onset of injury/illness/exacerbation are also affecting patient's functional outcome.   REHAB POTENTIAL: Good  CLINICAL DECISION MAKING: Stable/uncomplicated  EVALUATION COMPLEXITY: Low   GOALS: Goals reviewed with patient? Yes  SHORT TERM GOALS: Target date: 05/15/24  Pt will tolerate full aquatic sessions consistently without increase in pain and with improving function to demonstrate good toleration and effectiveness of intervention.  Baseline: Goal status: Met 05/21/24  2.  Pt will consider gaining pool access for use of the properties of water  for chronic conditions maintaining mobility and minimizing pain.  Baseline: no access yet Goal status: In progress 06/07/24    LONG TERM GOALS: Target date: 06/07/24  Pt to improve on LEFS by at least 9 point to demonstrate statistically significant Improvement in function. Baseline: 32/80 Goal status: INITIAL  2.  Pt will improve on  Tug test to <or= 13s (13.5 avg)  to demonstrate improvement in lower extremity function, mobility and decreased fall risk. Baseline: 16.11 Goal status: INITIAL  3.  Pt will report decrease in pain by at least 50% for improved toleration to activity/quality of life and to demonstrate improved management of pain. Baseline:  Goal status: INITIAL  4.  Pt will improve strength in knee by 5 lbs  to demonstrate improved overall physical function Baseline: see chart Goal status: INITIAL    PLAN:  PT FREQUENCY: 2x/week  PT DURATION: 8 weeks 12 visits likely alternating land and water   PLANNED INTERVENTIONS: [id number]- PT Re-evaluation, 97750- Physical Performance Testing, 97110-Therapeutic exercises, 97530- Therapeutic activity, 97112- Neuromuscular re-education, 97535- Self Care, [id number]- Manual therapy, [id number]- Gait training, [phone]- Orthotic Initial, [phone]- Aquatic Therapy, [phone]- Ionotophoresis 4mg /ml Dexamethasone , 79439 (1-2 muscles), 20561 (3+ muscles)- Dry Needling, Patient/Family education, Balance training, Stair training, Taping, Joint mobilization, DME instructions, Cryotherapy, and Moist heat.  PLAN FOR NEXT SESSION: Aquatic: instruct on and issue HEP  Land: core/hip strength, conditioning, stretching as appropriate   Delon Aquas, PTA 06/07/24 3:56 PM [facility] [address] [place], [place], [address] Phone: [phone]  Fax:  [phone]

## 2024-06-10 ENCOUNTER — Other Ambulatory Visit (HOSPITAL_BASED_OUTPATIENT_CLINIC_OR_DEPARTMENT_OTHER): Payer: Self-pay

## 2024-06-10 ENCOUNTER — Other Ambulatory Visit: Payer: Self-pay | Admitting: Family Medicine

## 2024-06-10 ENCOUNTER — Other Ambulatory Visit (HOSPITAL_COMMUNITY): Payer: Self-pay

## 2024-06-10 MED ORDER — CEPHALEXIN 500 MG PO CAPS
500.0000 mg | ORAL_CAPSULE | Freq: Two times a day (BID) | ORAL | 0 refills | Status: AC
  Filled 2024-06-10: qty 14, 7d supply, fill #0

## 2024-06-12 ENCOUNTER — Ambulatory Visit (HOSPITAL_BASED_OUTPATIENT_CLINIC_OR_DEPARTMENT_OTHER): Payer: Self-pay | Admitting: Physical Therapy

## 2024-06-13 ENCOUNTER — Ambulatory Visit (HOSPITAL_BASED_OUTPATIENT_CLINIC_OR_DEPARTMENT_OTHER): Payer: Self-pay | Admitting: Physical Therapy

## 2024-06-13 DIAGNOSIS — M25561 Pain in right knee: Secondary | ICD-10-CM | POA: Diagnosis not present

## 2024-06-13 DIAGNOSIS — G8929 Other chronic pain: Secondary | ICD-10-CM | POA: Diagnosis not present

## 2024-06-13 DIAGNOSIS — M5410 Radiculopathy, site unspecified: Secondary | ICD-10-CM | POA: Diagnosis not present

## 2024-06-13 DIAGNOSIS — M6281 Muscle weakness (generalized): Secondary | ICD-10-CM | POA: Diagnosis not present

## 2024-06-13 DIAGNOSIS — M5459 Other low back pain: Secondary | ICD-10-CM | POA: Diagnosis not present

## 2024-06-13 DIAGNOSIS — M25562 Pain in left knee: Secondary | ICD-10-CM | POA: Diagnosis not present

## 2024-06-13 NOTE — Therapy (Signed)
 OUTPATIENT PHYSICAL THERAPY THORACOLUMBAR TREATMENT   PROGRESS NOTE   Patient Name: Nancy [name] MRN: [id number] DOB:January 26, 1966, 58 y.o., female Today's Date: 06/14/2024  END OF SESSION:  PT End of Session - 06/13/24 1246     Visit Number 9    Number of Visits 11    Date for Recertification  06/27/24    Authorization Type aetna    PT Start Time 1200    PT Stop Time 1245    PT Time Calculation (min) 45 min    Activity Tolerance Patient tolerated treatment well    Behavior During Therapy WFL for tasks assessed/performed               Past Medical History:  Diagnosis Date   Anemia    Asthma    Bradycardia    Chicken pox    Dysrhythmia    occ. palpitations   Fibromyalgia    Headache(784.0)    Migraines    Neck pain    bulging disk   Polyarthralgia 02/21/2019   PONV (postoperative nausea and vomiting)    sometimes wakes up after surgery and cannot breath due to asthma, can be bradycardic   Rheumatoid arthritis involving multiple sites with positive rheumatoid factor (HCC) 02/21/2019   Shortness of breath    with bradycardia   Past Surgical History:  Procedure Laterality Date   ABDOMINAL HYSTERECTOMY     back procedure     nerve ablation to lower back 05/11/23, 1011/24   BACK SURGERY     cervical fusion C5,6,7 with a bulge [facility]   BLADDER SURGERY  July 10th 2013   CESAREAN SECTION     1992 and 1995   CHOLECYSTECTOMY N/A 10/07/2021   Procedure: LAPAROSCOPIC CHOLECYSTECTOMY;  Surgeon: Vernetta Berg, MD;  Location: [facility];  Service: General;  Laterality: N/A;   COLONOSCOPY  11/27/2015   Dr.Pyrtle   CYSTOSCOPY N/A 07/31/2018   Procedure: PHYLLIS;  Surgeon: Gaston Hamilton, MD;  Location: [facility];  Service: Urology;  Laterality: N/A;   DIAGNOSTIC LAPAROSCOPY     EXCISION OF MESH N/A 07/31/2018   Procedure: REMOVAL OF VAGINAL MESH;  Surgeon: Gaston Hamilton, MD;  Location: [facility];  Service: Urology;  Laterality: N/A;    LAPAROSCOPY Bilateral 05/31/2013   Procedure: LAPAROSCOPY withbilateral salpingo-oophorectomy, lysis of adhesions;  Surgeon: Lynwood FORBES Curlene PONCE, MD;  Location: [facility];  Service: Gynecology;  Laterality: Bilateral;  with clippings of Vaginal  mesh   SALPINGOOPHORECTOMY Bilateral 05/31/2013   Procedure: BILATERAL SALPINGO OOPHORECTOMY/TRIM MESH;  Surgeon: Lynwood FORBES Curlene PONCE, MD;  Location: [facility];  Service: Gynecology;  Laterality: Bilateral;   TUBAL LIGATION     1995   VAGINAL HYSTERECTOMY     1998 or 1997   WRIST FRACTURE SURGERY  2005   Patient Active Problem List   Diagnosis Date Noted   Lumbar radiculopathy 03/16/2023   Chronic pain 03/16/2023   Asthma 03/07/2022   Immunosuppressed status 12/26/2019   Hyperlipidemia 12/25/2019   Rheumatoid arthritis involving multiple sites with positive rheumatoid factor (HCC) 02/21/2019   Vitamin B12 deficiency 02/14/2019   Urge incontinence 12/20/2018   Urinary frequency 12/20/2018   Fibromyalgia 04/12/2018   Vitamin D  deficiency 04/12/2018   Vaginal dryness 04/12/2018   History of total hysterectomy 04/12/2018   Insomnia 04/12/2018   Morbid obesity (HCC) 04/12/2018   History of colon polyps 04/12/2018   Primary osteoarthritis of both knees 12/28/2017   Cervicogenic headache 03/26/2014   Cervical radiculopathy 03/26/2014  Mild intermittent asthma 11/20/2013   Bradycardia 07/04/2012    PCP: Katrinka Garnette KIDD, MD   REFERRING PROVIDER: Katrinka Garnette KIDD, MD   REFERRING DIAG:  [phone] (ICD-10-CM) - Lumbar radiculopathy  M79.7 (ICD-10-CM) - Fibromyalgia  M17.0 (ICD-10-CM) - Primary osteoarthritis of both knees    Rationale for Evaluation and Treatment: Rehabilitation  THERAPY DIAG:  Other low back pain  Chronic pain of both knees  Muscle weakness (generalized)  ONSET DATE: exacerbated last year  SUBJECTIVE:                                                                                                                                                                                            SUBJECTIVE STATEMENT: Pt denies any adverse effects after prior land based and aquatic treatment.  Pt had an epidural injection on 10/28 and she has been so-so.  Pt reports her best day was the Friday after the injection.  She didn't notice the pain on that Friday.  Pt states her pain has been on and off since.  Pt states she has had an ache down R LE which is new.  She gets this after lunch and at the end of the day.  She is still having N/T in R proximal thigh.  Pt reports the nerve pain is better though still present.  Pt states the epidural injection helped about 75%.  Pt is not having the sharp shooting pains anymore.  Pt reports 75% reduction in pain overall.  Pt states her pain is more manageable.  Pt reports she has not been performing HEP though is going to the gym 2-3 days/wk.     POOL ACCESS: currently none, but may look into [facility].     PERTINENT HISTORY:  Lumbar spondylolisthesis RA; OA; fibromyalgia (x 10 yrs ago), cervical fusion 2017  PAIN:  Are you having pain? Yes: NPRS scale: 1-2/10 current, 5-6/10 worst, 1/10 best Pain location: Rt groin Pain description: constant and variable achy LB;   Aggravating factors: house work, being up on it  Relieving factors: resting; ice/heat; tramadol ; Ibuprofen   PRECAUTIONS: None  RED FLAGS: None   WEIGHT BEARING RESTRICTIONS: No  FALLS:  Has patient fallen in last 6 months? Yes. 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  Works for [employer]  PLOF: Independent  PATIENT GOALS: stronger  NEXT MD VISIT: monthly  OBJECTIVE:  Note: Objective measures were  completed at Evaluation unless otherwise noted.  DIAGNOSTIC FINDINGS:  MRI  Lumbar IMPRESSION: 1. L1-2: Development of a central to right-sided disc herniation that indents the thecal sac. Definite neural compression is not demonstrated, but this could be a cause of back pain or lumbar nerve root irritation. New since 2023. 2. L3-4: Shallow left posterolateral disc herniation with stenosis of the left lateral recess that could possibly be symptomatic. This has worsened since 2023. 3. L4-5: Bilateral facet arthropathy with anterolisthesis of 3 mm, worsened since 2023. 4 x 5 mm synovial cyst associated with the left facet joint projecting in the spinal canal. Stenosis of both lateral recesses that could possibly be symptomatic. This has worsened since 2023. 4. L5-S1: Disc bulge and facet osteoarthritis. No compressive stenosis.  PATIENT SURVEYS:  LEFS 32/80  (eval)  42/80  (06/13/24)  COGNITION: Overall cognitive status: Within functional limits for tasks assessed     SENSATION: Numbness left last 3 digit and calf  MUSCLE LENGTH: Hamstrings: wfl tested in sitting   POSTURE: rounded shoulders, forward head, decreased lumbar lordosis, and bilateral valgus deformities knee R>L  PALPATION: TTP throughout lower thoracic and lumbar paraspinals  LUMBAR ROM:   AROM eval 11/13  Flexion full   Extension neutral   Right lateral flexion Limited 75% P! 75%, no pain  Left lateral flexion Limited 75% P! WFL, no pain  Right rotation  WFL  Left rotation  80%   (Blank rows = not tested)  LOWER EXTREMITY ROM:     WFL  LOWER EXTREMITY MMT:    HD (lb) Tested in sitting Right eval Left eval Right 1/13 Left 11/13  Hip flexion 31.7 34.1 29.6 31  Hip extension      Hip abduction 22.2 18.2 23.5 27.0  Hip adduction      Hip internal rotation      Hip external rotation      Knee flexion      Knee extension 14.3 11.8 32.8 35.3  Ankle dorsiflexion      Ankle plantarflexion      Ankle inversion      Ankle eversion       (Blank rows = not  tested)  LUMBAR SPECIAL TESTS:  Slump test: Negative  FUNCTIONAL TESTS:  5 times sit to stand: 15.14 from pool bench Timed up and go (TUG): 16.11   4 stage balance: Passed 1&2. Tandem: needs assist gaining position holding x 4s; SLS unable GAIT: Distance walked: 400 ft Assistive device utilized: None Level of assistance: Complete Independence Comments: right knee valgus deformity causes hip circumduction to advance rle during swing.  Initial gait antalgic, reduces with distance  TREATMENT  06/13/24 Reviewed current function, pain levels, response to prior treatments, and HEP compliance.   Timed up and go (TUG): 11.24 PT assessed lumbar AROM and LE strength.  See above Reviewed HEP.  Pt completed LEFS.  See above  Manual Therapy:  gentle STM and TPR to bilat lumbar paraspinals and R glute in prone   [facility] Adult PT Treatment:                                                DATE: 06/07/24 Pt seen for aquatic therapy today.  Treatment took place in water  3.5-4.75 ft in depth at the [facility] pool. Temp of water  was 91.  Pt entered/exited the pool via  stairs using step to pattern with hand rail.  *  noodle wrapped posteriorly for decompression->straddling noodle: cycling across pool, relaxed arms->  UE on wall: hip abdct/ add, hip flexion/extension *unsupported: walking forward/ backward/ side stepping (pain in Rt with Lt side stepping)  * UE on wall:  straight leg hip circles CW/CCW; hip abdct/ add; squats; single leg clams * TrA set with 1/2 hollow noodle pull down to thighs x 10,  * plank with hands on yellow hand floats-> on stairs in water  with  hip ext x 5 each LE; slow mountain climbers x4 * side plank on stairs -> starfish x 5 each  * tall kneeling on noodle with UE on wall for core engagement and balance challenge  05/23/24 Reviewed pt presentation, response to prior Rx, HEP compliance, and pain level.  PT educated pt in correct performance and palpation of TrA  contraction.  Pt performed TrA contractions with and without 5 sec hold.  Supine bridge with TrA Supine PPT x 5 reps Supine clams with RTB with TrA 2x10 Supine heel slides with TrA x 10 each Supine manual HS stretch 2x30 sec bilat  Manual Therapy:  gentle STM and TPR to bilat lumbar paraspinals and R glute in prone      PATIENT EDUCATION:  Education details: exercise form, rationale of exercises Person educated: Patient Education method: Explanation, demonstration, verbal cues Education comprehension: verbalized understanding, returned demonstration, verbal cues required  HOME EXERCISE PROGRAM: AQUATIC Access Code: [email or web address] URL: [email or web address] Date: 06/07/2024  * not issued yet Prepared by: [facility] [address] This aquatic home exercise program from [employer] utilizes pictures from land based exercises, but has been adapted prior to lamination and issuance.    Land HEP: Access Code: [email or web address] URL: [email or web address] Date: 04/23/2024 Prepared by: Josette Rough   ASSESSMENT:  CLINICAL IMPRESSION: Pt had an epidural injection on 10/28 and states the epidural injection helped about 75%.   Pt reports the nerve pain is better though still present.  She is still having N/T in R proximal thigh.  She is not having the sharp shooting pains anymore though is having an ache down R LE which is new.  Pt hasn't been performing her HEP though is going to the gym 2-3 days/wk. Pt doesn't have pool access yet, but is looking into it.  Pt demonstrates improved L Sb'ing and she had no pain with lumbar AROM testing.  Pt improved on the TUG test from 16.11 sec initially to 11.24 currently.  Pt demonstrates a significant improvement in strength in bilat knee extension as evidenced by HHD.  She also improved with strength in bilat hip abduction.  Pt had improved tolerance with STM to lumbar paraspinals.  Pt demonstrates clinically significant  improvement in self perceived disability with LEFS improving from 32/80 initially to 42/80 currently.  She has met all of her goals except STG #2.  PT added a long term goal of land based and aquatic HEP.  Pt should benefit from 2 more visits of PT.      OBJECTIVE IMPAIRMENTS: Abnormal gait, decreased activity tolerance, decreased balance, decreased mobility, decreased strength, obesity, and pain.   ACTIVITY LIMITATIONS: carrying, lifting, bending, squatting, stairs, and transfers  PARTICIPATION LIMITATIONS: cleaning, shopping, and community activity  PERSONAL FACTORS: Time since onset of injury/illness/exacerbation are also affecting patient's functional outcome.   REHAB POTENTIAL: Good  CLINICAL DECISION MAKING: Stable/uncomplicated  EVALUATION COMPLEXITY: Low   GOALS: Goals reviewed with patient? Yes  SHORT TERM GOALS: Target  date: 05/15/24  Pt will tolerate full aquatic sessions consistently without increase in pain and with improving function to demonstrate good toleration and effectiveness of intervention.  Baseline: Goal status: Met 05/21/24  2.  Pt will consider gaining pool access for use of the properties of water  for chronic conditions maintaining mobility and minimizing pain.  Baseline: no access yet, but is looking Goal status: In progress 06/07/24, 06/13/24    LONG TERM GOALS: Target date: 06/07/24  Pt to improve on LEFS by at least 9 point to demonstrate statistically significant Improvement in function. Baseline: 32/80 Goal status: GOAL MET  11/13  2.  Pt will improve on Tug test to <or= 13s (13.5 avg)  to demonstrate improvement in lower extremity function, mobility and decreased fall risk. Baseline: 16.11 Goal status: GOAL MET  11/13  3.  Pt will report decrease in pain by at least 50% for improved toleration to activity/quality of life and to demonstrate improved management of pain. Baseline:  Goal status: GOAL MET  11/13  4.  Pt will improve strength  in knee by 5 lbs  to demonstrate improved overall physical function Baseline: see chart Goal status:  GOAL MET  11/13  5.  Pt will be independent with land based and aquatic HEP for improved pain, strength, tolerance to activity, and functional mobility.   Goal status:  INITIAL  Target date:  06/27/2024    PLAN:  PT FREQUENCY:1x/wk  PT DURATION:  2 weeks  PLANNED INTERVENTIONS: 97164- PT Re-evaluation, 97750- Physical Performance Testing, 97110-Therapeutic exercises, 97530- Therapeutic activity, 97112- Neuromuscular re-education, 97535- Self Care, [id number]- Manual therapy, [phone]- Gait training, [phone]- Orthotic Initial, [phone]- Aquatic Therapy, [phone]- Ionotophoresis 4mg /ml Dexamethasone , 79439 (1-2 muscles), 20561 (3+ muscles)- Dry Needling, Patient/Family education, Balance training, Stair training, Taping, Joint mobilization, DME instructions, Cryotherapy, and Moist heat.  PLAN FOR NEXT SESSION: Aquatic: instruct on and issue HEP.  Land: core/hip strength, conditioning, stretching as appropriate.  2 more visits, 1 on land and 1 in the pool.  Leigh Minerva III PT, DPT 06/14/24 11:51 AM   [facility] [address] [place], [place], [address] Phone: [phone]   Fax:  [phone]

## 2024-06-14 ENCOUNTER — Encounter (HOSPITAL_BASED_OUTPATIENT_CLINIC_OR_DEPARTMENT_OTHER): Payer: Self-pay | Admitting: Physical Therapy

## 2024-06-20 ENCOUNTER — Encounter (HOSPITAL_BASED_OUTPATIENT_CLINIC_OR_DEPARTMENT_OTHER): Payer: Self-pay | Admitting: Physical Therapy

## 2024-06-20 ENCOUNTER — Ambulatory Visit (HOSPITAL_BASED_OUTPATIENT_CLINIC_OR_DEPARTMENT_OTHER): Payer: Self-pay | Admitting: Physical Therapy

## 2024-06-20 DIAGNOSIS — G8929 Other chronic pain: Secondary | ICD-10-CM

## 2024-06-20 DIAGNOSIS — M6281 Muscle weakness (generalized): Secondary | ICD-10-CM | POA: Diagnosis not present

## 2024-06-20 DIAGNOSIS — M5459 Other low back pain: Secondary | ICD-10-CM

## 2024-06-20 DIAGNOSIS — M25561 Pain in right knee: Secondary | ICD-10-CM | POA: Diagnosis not present

## 2024-06-20 DIAGNOSIS — M25562 Pain in left knee: Secondary | ICD-10-CM | POA: Diagnosis not present

## 2024-06-20 DIAGNOSIS — M5410 Radiculopathy, site unspecified: Secondary | ICD-10-CM | POA: Diagnosis not present

## 2024-06-20 NOTE — Therapy (Signed)
 OUTPATIENT PHYSICAL THERAPY THORACOLUMBAR TREATMENT     Patient Name: Nancy [name] MRN: [id number] DOB:[birth year], 58 y.o., female Today's Date: 06/21/2024  END OF SESSION:  PT End of Session - 06/20/24 1425     Visit Number 10    Number of Visits 11    Date for Recertification  06/27/24    Authorization Type aetna    PT Start Time 1408    PT Stop Time 1447    PT Time Calculation (min) 39 min    Activity Tolerance Patient tolerated treatment well    Behavior During Therapy WFL for tasks assessed/performed                Past Medical History:  Diagnosis Date   Anemia    Asthma    Bradycardia    Chicken pox    Dysrhythmia    occ. palpitations   Fibromyalgia    Headache(784.0)    Migraines    Neck pain    bulging disk   Polyarthralgia 02/21/2019   PONV (postoperative nausea and vomiting)    sometimes wakes up after surgery and cannot breath due to asthma, can be bradycardic   Rheumatoid arthritis involving multiple sites with positive rheumatoid factor (HCC) 02/21/2019   Shortness of breath    with bradycardia   Past Surgical History:  Procedure Laterality Date   ABDOMINAL HYSTERECTOMY     back procedure     nerve ablation to lower back 05/11/23, 1011/24   BACK SURGERY     cervical fusion C5,6,7 with a bulge at [facility]   BLADDER SURGERY  July 10th 2013   CESAREAN SECTION     1992 and 1995   CHOLECYSTECTOMY N/A 10/07/2021   Procedure: LAPAROSCOPIC CHOLECYSTECTOMY;  Surgeon: Vernetta Berg, MD;  Location:  SURGERY CENTER;  Service: General;  Laterality: N/A;   COLONOSCOPY  11/27/2015   Dr.Pyrtle   CYSTOSCOPY N/A 07/31/2018   Procedure: PHYLLIS;  Surgeon: Gaston Hamilton, MD;  Location: [facility];  Service: Urology;  Laterality: N/A;   DIAGNOSTIC LAPAROSCOPY     EXCISION OF MESH N/A 07/31/2018   Procedure: REMOVAL OF VAGINAL MESH;  Surgeon: Gaston Hamilton, MD;  Location: [facility];  Service: Urology;  Laterality: N/A;   LAPAROSCOPY  Bilateral 05/31/2013   Procedure: LAPAROSCOPY withbilateral salpingo-oophorectomy, lysis of adhesions;  Surgeon: Lynwood FORBES Curlene PONCE, MD;  Location: [facility];  Service: Gynecology;  Laterality: Bilateral;  with clippings of Vaginal  mesh   SALPINGOOPHORECTOMY Bilateral 05/31/2013   Procedure: BILATERAL SALPINGO OOPHORECTOMY/TRIM MESH;  Surgeon: Lynwood FORBES Curlene PONCE, MD;  Location: [facility];  Service: Gynecology;  Laterality: Bilateral;   TUBAL LIGATION     1995   VAGINAL HYSTERECTOMY     1998 or 1997   WRIST FRACTURE SURGERY  2005   Patient Active Problem List   Diagnosis Date Noted   Lumbar radiculopathy 03/16/2023   Chronic pain 03/16/2023   Asthma 03/07/2022   Immunosuppressed status 12/26/2019   Hyperlipidemia 12/25/2019   Rheumatoid arthritis involving multiple sites with positive rheumatoid factor (HCC) 02/21/2019   Vitamin B12 deficiency 02/14/2019   Urge incontinence 12/20/2018   Urinary frequency 12/20/2018   Fibromyalgia 04/12/2018   Vitamin D  deficiency 04/12/2018   Vaginal dryness 04/12/2018   History of total hysterectomy 04/12/2018   Insomnia 04/12/2018   Morbid obesity (HCC) 04/12/2018   History of colon polyps 04/12/2018   Primary osteoarthritis of both knees 12/28/2017   Cervicogenic headache 03/26/2014   Cervical radiculopathy 03/26/2014   Mild  intermittent asthma 11/20/2013   Bradycardia 07/04/2012    PCP: Katrinka Garnette KIDD, MD   REFERRING PROVIDER: Katrinka Garnette KIDD, MD   REFERRING DIAG:  [phone] (ICD-10-CM) - Lumbar radiculopathy  M79.7 (ICD-10-CM) - Fibromyalgia  M17.0 (ICD-10-CM) - Primary osteoarthritis of both knees    Rationale for Evaluation and Treatment: Rehabilitation  THERAPY DIAG:  Other low back pain  Chronic pain of both knees  Muscle weakness (generalized)  ONSET DATE: exacerbated last year  SUBJECTIVE:                                                                                                                                                                                            SUBJECTIVE STATEMENT: Pt denies any adverse effects after prior land based treatment.  Pt states her mid back has been hurting the past couple of days.     POOL ACCESS: currently none, but may look into [facility].     PERTINENT HISTORY:  Lumbar spondylolisthesis RA; OA; fibromyalgia (x 10 yrs ago), cervical fusion 2017  PAIN:  Are you having pain? Yes: NPRS scale: 0/10 current, 5-6/10 worst, 1/10 best Pain location: Rt hip and groin and low back  / central to L side midback Pain description: constant and variable achy LB;   Aggravating factors: house work, being up on it  Relieving factors: resting; ice/heat; tramadol ; Ibuprofen   PRECAUTIONS: None  RED FLAGS: None   WEIGHT BEARING RESTRICTIONS: No  FALLS:  Has patient fallen in last 6 months? Yes. 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  DIAGNOSTIC FINDINGS:  MRI Lumbar IMPRESSION: 1. L1-2: Development of a central to right-sided disc herniation that indents the thecal sac. Definite neural compression is not demonstrated, but this could be a cause of back pain or lumbar nerve root irritation. New since 2023. 2. L3-4: Shallow left posterolateral disc herniation with stenosis of the left lateral recess that could possibly be symptomatic. This has worsened since 2023. 3. L4-5: Bilateral facet arthropathy with anterolisthesis of 3 mm, worsened since 2023. 4 x 5 mm synovial cyst associated with the left facet joint projecting in the spinal canal. Stenosis of  both lateral recesses that could possibly be symptomatic. This has worsened since 2023. 4.  L5-S1: Disc bulge and facet osteoarthritis. No compressive stenosis.  PATIENT SURVEYS:  LEFS 32/80  (eval)  42/80  (06/13/24)  COGNITION: Overall cognitive status: Within functional limits for tasks assessed     SENSATION: Numbness left last 3 digit and calf  MUSCLE LENGTH: Hamstrings: wfl tested in sitting   POSTURE: rounded shoulders, forward head, decreased lumbar lordosis, and bilateral valgus deformities knee R>L  PALPATION: TTP throughout lower thoracic and lumbar paraspinals  LUMBAR ROM:   AROM eval 11/13  Flexion full   Extension neutral   Right lateral flexion Limited 75% P! 75%, no pain  Left lateral flexion Limited 75% P! WFL, no pain  Right rotation  WFL  Left rotation  80%   (Blank rows = not tested)  LOWER EXTREMITY ROM:     WFL  LOWER EXTREMITY MMT:    HD (lb) Tested in sitting Right eval Left eval Right 1/13 Left 11/13  Hip flexion 31.7 34.1 29.6 31  Hip extension      Hip abduction 22.2 18.2 23.5 27.0  Hip adduction      Hip internal rotation      Hip external rotation      Knee flexion      Knee extension 14.3 11.8 32.8 35.3  Ankle dorsiflexion      Ankle plantarflexion      Ankle inversion      Ankle eversion       (Blank rows = not tested)  LUMBAR SPECIAL TESTS:  Slump test: Negative  FUNCTIONAL TESTS:  5 times sit to stand: 15.14 from pool bench Timed up and go (TUG): 16.11   4 stage balance: Passed 1&2. Tandem: needs assist gaining position holding x 4s; SLS unable GAIT: Distance walked: 400 ft Assistive device utilized: None Level of assistance: Complete Independence Comments: right knee valgus deformity causes hip circumduction to advance rle during swing.  Initial gait antalgic, reduces with distance  TREATMENT  06/20/2024  Reviewed HEP. Supine bridge with TrA x 10, with RTB around LE's x10 Supine marching with TrA  2x10 Supine heel slides with TrA 2x10 Supine lumbar rotation  Supine manual HS stretch x 30 sec bilat  Supine HS stretch with strap approx 20-30 sec bilat Supine clams with RTB with TrA x 10, with GTB 2x 10  Updated HEP with supine heel slides with TrA.  Gave pt a HEP handout and educated pt in correct form and appropriate frequency.   06/13/24 Reviewed current function, pain levels, response to prior treatments, and HEP compliance.   Timed up and go (TUG): 11.24 PT assessed lumbar AROM and LE strength.  See above Reviewed HEP.  Pt completed LEFS.  See above  Manual Therapy:  gentle STM and TPR to bilat lumbar paraspinals and R glute in prone   [facility] Adult PT Treatment:                                                DATE: 06/07/24 Pt seen for aquatic therapy today.  Treatment took place in water  3.5-4.75 ft in depth at the [facility] pool. Temp of water  was 91.  Pt entered/exited the pool via stairs using step to pattern with hand rail.  *  noodle wrapped posteriorly for decompression->straddling noodle: cycling across pool, relaxed arms->  UE on wall: hip abdct/ add, hip flexion/extension *unsupported: walking forward/ backward/ side  stepping (pain in Rt with Lt side stepping)  * UE on wall:  straight leg hip circles CW/CCW; hip abdct/ add; squats; single leg clams * TrA set with 1/2 hollow noodle pull down to thighs x 10,  * plank with hands on yellow hand floats-> on stairs in water  with  hip ext x 5 each LE; slow mountain climbers x4 * side plank on stairs -> starfish x 5 each  * tall kneeling on noodle with UE on wall for core engagement and balance challenge  05/23/24 Reviewed pt presentation, response to prior Rx, HEP compliance, and pain level.  PT educated pt in correct performance and palpation of TrA contraction.  Pt performed TrA contractions with and without 5 sec hold.  Supine bridge with TrA Supine PPT x 5 reps Supine clams with RTB with TrA 2x10 Supine  heel slides with TrA x 10 each Supine manual HS stretch 2x30 sec bilat  Manual Therapy:  gentle STM and TPR to bilat lumbar paraspinals and R glute in prone      PATIENT EDUCATION:  Education details: exercise form, rationale of exercises Person educated: Patient Education method: Explanation, demonstration, verbal cues Education comprehension: verbalized understanding, returned demonstration, verbal cues required  HOME EXERCISE PROGRAM: AQUATIC Access Code: [email or web address] URL: [email or web address] Date: 06/07/2024  * not issued yet Prepared by: [facility] - [address] This aquatic home exercise program from [employer] utilizes pictures from land based exercises, but has been adapted prior to lamination and issuance.    Land HEP: Access Code: [email or web address] URL: [email or web address] Date: 04/23/2024 Prepared by: Josette Rough  Updated HEP: - Supine Core Control with Heel Slide  - 1 x daily - 7 x weekly - 2 sets - 10 reps   ASSESSMENT:  CLINICAL IMPRESSION: PT worked on land based HEP today.  PT reviewed current HEP and instructed pt in correct exercises, correct form, and appropriate frequency.  PT updated HEP with supine heel slides with TrA and educated pt in how to progress.  Pt states she could feel her back with select exercises on the table including lumbar rotation and HS stretch with strap.  Pt tolerated treatment well having no c/o's after treatment.  Pt is independent with land based HEP and partially met LTG #5.  She has completed land based treatment and will go to the pool for 1 more aquatic visit.    OBJECTIVE IMPAIRMENTS: Abnormal gait, decreased activity tolerance, decreased balance, decreased mobility, decreased strength, obesity, and pain.   ACTIVITY LIMITATIONS: carrying, lifting, bending, squatting, stairs, and transfers  PARTICIPATION LIMITATIONS: cleaning, shopping, and community activity  PERSONAL FACTORS: Time  since onset of injury/illness/exacerbation are also affecting patient's functional outcome.   REHAB POTENTIAL: Good  CLINICAL DECISION MAKING: Stable/uncomplicated  EVALUATION COMPLEXITY: Low   GOALS: Goals reviewed with patient? Yes  SHORT TERM GOALS: Target date: 05/15/24  Pt will tolerate full aquatic sessions consistently without increase in pain and with improving function to demonstrate good toleration and effectiveness of intervention.  Baseline: Goal status: Met 05/21/24  2.  Pt will consider gaining pool access for use of the properties of water  for chronic conditions maintaining mobility and minimizing pain.  Baseline: no access yet, but is looking Goal status: In progress 06/07/24, 06/13/24    LONG TERM GOALS: Target date: 06/07/24  Pt to improve on LEFS by at least 9 point to demonstrate statistically significant Improvement in function. Baseline: 32/80 Goal status: GOAL MET  11/13  2.  Pt will improve on  Tug test to <or= 13s (13.5 avg)  to demonstrate improvement in lower extremity function, mobility and decreased fall risk. Baseline: 16.11 Goal status: GOAL MET  11/13  3.  Pt will report decrease in pain by at least 50% for improved toleration to activity/quality of life and to demonstrate improved management of pain. Baseline:  Goal status: GOAL MET  11/13  4.  Pt will improve strength in knee by 5 lbs  to demonstrate improved overall physical function Baseline: see chart Goal status:  GOAL MET  11/13  5.  Pt will be independent with land based and aquatic HEP for improved pain, strength, tolerance to activity, and functional mobility.   Goal status:  50% MET  independent with land based HEP  06/20/24  Target date:  06/27/2024    PLAN:  PT FREQUENCY:1x/wk  PT DURATION:  2 weeks  PLANNED INTERVENTIONS: 97164- PT Re-evaluation, 97750- Physical Performance Testing, 97110-Therapeutic exercises, 97530- Therapeutic activity, 97112- Neuromuscular  re-education, 97535- Self Care, 02859- Manual therapy, [phone]- Gait training, [phone]- Orthotic Initial, [phone]- Aquatic Therapy, [phone]- Ionotophoresis 4mg /ml Dexamethasone , 79439 (1-2 muscles), 20561 (3+ muscles)- Dry Needling, Patient/Family education, Balance training, Stair training, Taping, Joint mobilization, DME instructions, Cryotherapy, and Moist heat.  PLAN FOR NEXT SESSION: Pt to be discharged next visit which will be an aquatic visit.  Work on Conservation Officer, [employer].    Leigh Minerva III PT, DPT 06/21/24 1:08 PM    [facility] [address] [place], [place], [address] Phone: [phone]   Fax:  [phone]

## 2024-06-26 ENCOUNTER — Ambulatory Visit (HOSPITAL_BASED_OUTPATIENT_CLINIC_OR_DEPARTMENT_OTHER): Admitting: Physical Therapy

## 2024-06-26 ENCOUNTER — Encounter (HOSPITAL_BASED_OUTPATIENT_CLINIC_OR_DEPARTMENT_OTHER): Payer: Self-pay | Admitting: Physical Therapy

## 2024-06-26 DIAGNOSIS — M25562 Pain in left knee: Secondary | ICD-10-CM | POA: Diagnosis not present

## 2024-06-26 DIAGNOSIS — M6281 Muscle weakness (generalized): Secondary | ICD-10-CM | POA: Diagnosis not present

## 2024-06-26 DIAGNOSIS — G8929 Other chronic pain: Secondary | ICD-10-CM

## 2024-06-26 DIAGNOSIS — M5410 Radiculopathy, site unspecified: Secondary | ICD-10-CM

## 2024-06-26 DIAGNOSIS — M5459 Other low back pain: Secondary | ICD-10-CM

## 2024-06-26 DIAGNOSIS — M25561 Pain in right knee: Secondary | ICD-10-CM | POA: Diagnosis not present

## 2024-06-26 NOTE — Therapy (Signed)
 OUTPATIENT PHYSICAL THERAPY THORACOLUMBAR TREATMENT     Patient Name: Nancy [name] MRN: [id number] DOB:1965-09-23, 58 y.o., female Today's Date: 06/26/2024  END OF SESSION:  PT End of Session - 06/26/24 1723     Visit Number 11    Number of Visits 11    Date for Recertification  06/27/24    Authorization Type aetna    PT Start Time 1700    PT Stop Time 1739    PT Time Calculation (min) 39 min    Activity Tolerance Patient tolerated treatment well    Behavior During Therapy WFL for tasks assessed/performed                Past Medical History:  Diagnosis Date   Anemia    Asthma    Bradycardia    Chicken pox    Dysrhythmia    occ. palpitations   Fibromyalgia    Headache(784.0)    Migraines    Neck pain    bulging disk   Polyarthralgia 02/21/2019   PONV (postoperative nausea and vomiting)    sometimes wakes up after surgery and cannot breath due to asthma, can be bradycardic   Rheumatoid arthritis involving multiple sites with positive rheumatoid factor (HCC) 02/21/2019   Shortness of breath    with bradycardia   Past Surgical History:  Procedure Laterality Date   ABDOMINAL HYSTERECTOMY     back procedure     nerve ablation to lower back 05/11/23, 1011/24   BACK SURGERY     cervical fusion C5,6,7 with a bulge at [facility]   BLADDER SURGERY  July 10th 2013   CESAREAN SECTION     1992 and 1995   CHOLECYSTECTOMY N/A 10/07/2021   Procedure: LAPAROSCOPIC CHOLECYSTECTOMY;  Surgeon: Vernetta Berg, MD;  Location: [facility];  Service: General;  Laterality: N/A;   COLONOSCOPY  11/27/2015   Dr.Pyrtle   CYSTOSCOPY N/A 07/31/2018   Procedure: PHYLLIS;  Surgeon: Gaston Hamilton, MD;  Location: [facility];  Service: Urology;  Laterality: N/A;   DIAGNOSTIC LAPAROSCOPY     EXCISION OF MESH N/A 07/31/2018   Procedure: REMOVAL OF VAGINAL MESH;  Surgeon: Gaston Hamilton, MD;  Location: [facility];  Service: Urology;  Laterality: N/A;   LAPAROSCOPY  Bilateral 05/31/2013   Procedure: LAPAROSCOPY withbilateral salpingo-oophorectomy, lysis of adhesions;  Surgeon: Lynwood FORBES Curlene PONCE, MD;  Location: [facility];  Service: Gynecology;  Laterality: Bilateral;  with clippings of Vaginal  mesh   SALPINGOOPHORECTOMY Bilateral 05/31/2013   Procedure: BILATERAL SALPINGO OOPHORECTOMY/TRIM MESH;  Surgeon: Lynwood FORBES Curlene PONCE, MD;  Location: [facility];  Service: Gynecology;  Laterality: Bilateral;   TUBAL LIGATION     1995   VAGINAL HYSTERECTOMY     1998 or 1997   WRIST FRACTURE SURGERY  2005   Patient Active Problem List   Diagnosis Date Noted   Lumbar radiculopathy 03/16/2023   Chronic pain 03/16/2023   Asthma 03/07/2022   Immunosuppressed status 12/26/2019   Hyperlipidemia 12/25/2019   Rheumatoid arthritis involving multiple sites with positive rheumatoid factor (HCC) 02/21/2019   Vitamin B12 deficiency 02/14/2019   Urge incontinence 12/20/2018   Urinary frequency 12/20/2018   Fibromyalgia 04/12/2018   Vitamin D  deficiency 04/12/2018   Vaginal dryness 04/12/2018   History of total hysterectomy 04/12/2018   Insomnia 04/12/2018   Morbid obesity (HCC) 04/12/2018   History of colon polyps 04/12/2018   Primary osteoarthritis of both knees 12/28/2017   Cervicogenic headache 03/26/2014   Cervical radiculopathy 03/26/2014   Mild  intermittent asthma 11/20/2013   Bradycardia 07/04/2012    PCP: Katrinka Garnette KIDD, MD   REFERRING PROVIDER: Katrinka Garnette KIDD, MD   REFERRING DIAG:  [phone] (ICD-10-CM) - Lumbar radiculopathy  M79.7 (ICD-10-CM) - Fibromyalgia  M17.0 (ICD-10-CM) - Primary osteoarthritis of both knees    Rationale for Evaluation and Treatment: Rehabilitation  THERAPY DIAG:  Other low back pain  Chronic pain of both knees  Muscle weakness (generalized)  Radicular pain of left lower extremity  ONSET DATE: exacerbated last year  SUBJECTIVE:                                                                                                                                                                                            SUBJECTIVE STATEMENT: I have been painfree for 5 days.    POOL ACCESS: going to join [facility].     PERTINENT HISTORY:  Lumbar spondylolisthesis RA; OA; fibromyalgia (x 10 yrs ago), cervical fusion 2017  PAIN:  Are you having pain? no: NPRS scale: 0/10 current,  Pain location:  Pain description:  Aggravating factors:  Relieving factors:   PRECAUTIONS: None  RED FLAGS: None   WEIGHT BEARING RESTRICTIONS: No  FALLS:  Has patient fallen in last 6 months? Yes. 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  DIAGNOSTIC FINDINGS:  MRI Lumbar IMPRESSION: 1. L1-2: Development of a central to right-sided disc herniation that indents the thecal sac. Definite neural compression is not demonstrated, but this could be a cause of back pain or lumbar nerve root irritation. New since 2023. 2. L3-4: Shallow left posterolateral disc herniation with stenosis of the left lateral recess that could possibly be symptomatic. This has worsened since 2023. 3. L4-5: Bilateral facet arthropathy with anterolisthesis of 3 mm, worsened since 2023. 4 x 5 mm synovial cyst associated with the left facet joint projecting in the spinal canal. Stenosis of both lateral recesses that could possibly be symptomatic. This has worsened since 2023. 4. L5-S1: Disc bulge and facet osteoarthritis. No compressive stenosis.  PATIENT SURVEYS:  LEFS 32/80  (eval)  42/80  (06/13/24)  COGNITION: Overall cognitive  status: Within functional limits for tasks assessed     SENSATION: Numbness left last 3 digit and calf  MUSCLE LENGTH: Hamstrings: wfl tested in sitting   POSTURE: rounded shoulders, forward head, decreased lumbar lordosis, and bilateral valgus deformities knee R>L  PALPATION: TTP throughout lower thoracic and lumbar paraspinals  LUMBAR ROM:   AROM eval 11/13  Flexion full   Extension neutral   Right lateral flexion Limited 75% P! 75%, no pain  Left lateral flexion Limited 75% P! WFL, no pain  Right rotation  WFL  Left rotation  80%   (Blank rows = not tested)  LOWER EXTREMITY ROM:     WFL  LOWER EXTREMITY MMT:    HD (lb) Tested in sitting Right eval Left eval Right 1/13 Left 11/13  Hip flexion 31.7 34.1 29.6 31  Hip extension      Hip abduction 22.2 18.2 23.5 27.0  Hip adduction      Hip internal rotation      Hip external rotation      Knee flexion      Knee extension 14.3 11.8 32.8 35.3  Ankle dorsiflexion      Ankle plantarflexion      Ankle inversion      Ankle eversion       (Blank rows = not tested)  LUMBAR SPECIAL TESTS:  Slump test: Negative  FUNCTIONAL TESTS:  5 times sit to stand: 15.14 from pool bench Timed up and go (TUG): 16.11   4 stage balance: Passed 1&2. Tandem: needs assist gaining position holding x 4s; SLS unable GAIT: Distance walked: 400 ft Assistive device utilized: None Level of assistance: Complete Independence Comments: right knee valgus deformity causes hip circumduction to advance rle during swing.  Initial gait antalgic, reduces with distance  TREATMENT  06/26/24 Aquatic Exercises - walking forward/ backward with gentle arm swing  - Side Stepping with and without Hand floats    - suitcase carry with bil rainbow hand floats under water  at side - Standing Single Leg Hip Circles with Hand Floats  - Pool noodle/  hand float pull down to front of thighs   - Single Leg Clam - hold wall or hand floats   - Plank with hands  on bench or hand floats with SMALL leg lift   - side plank with hand on bench/stairs.   Starfish arm or leg circles.   - Forward Backward Leg Swing - hold wall or hand floats  - Leg Swings Side to Side   06/20/2024  Reviewed HEP. Supine bridge with TrA x 10, with RTB around LE's x10 Supine marching with TrA 2x10 Supine heel slides with TrA 2x10 Supine lumbar rotation  Supine manual HS stretch x 30 sec bilat  Supine HS stretch with strap approx 20-30 sec bilat Supine clams with RTB with TrA x 10, with GTB 2x 10  Updated HEP with supine heel slides with TrA.  Gave pt a HEP handout and educated pt in correct form and appropriate frequency.   06/13/24 Reviewed current function, pain levels, response to prior treatments, and HEP compliance.   Timed up and go (TUG): 11.24 PT assessed lumbar AROM and LE strength.  See above Reviewed HEP.  Pt completed LEFS.  See above  Manual Therapy:  gentle STM and TPR to bilat lumbar paraspinals and R glute in prone   [facility] Adult PT Treatment:  DATE: 06/07/24 Pt seen for aquatic therapy today.  Treatment took place in water  3.5-4.75 ft in depth at the [facility] pool. Temp of water  was 91.  Pt entered/exited the pool via stairs using step to pattern with hand rail.  *  noodle wrapped posteriorly for decompression->straddling noodle: cycling across pool, relaxed arms->  UE on wall: hip abdct/ add, hip flexion/extension *unsupported: walking forward/ backward/ side stepping (pain in Rt with Lt side stepping)  * UE on wall:  straight leg hip circles CW/CCW; hip abdct/ add; squats; single leg clams * TrA set with 1/2 hollow noodle pull down to thighs x 10,  * plank with hands on yellow hand floats-> on stairs in water  with  hip ext x 5 each LE; slow mountain climbers x4 * side plank on stairs -> starfish x 5 each  * tall kneeling on noodle with UE on wall for core engagement and balance  challenge  05/23/24 Reviewed pt presentation, response to prior Rx, HEP compliance, and pain level.  PT educated pt in correct performance and palpation of TrA contraction.  Pt performed TrA contractions with and without 5 sec hold.  Supine bridge with TrA Supine PPT x 5 reps Supine clams with RTB with TrA 2x10 Supine heel slides with TrA x 10 each Supine manual HS stretch 2x30 sec bilat  Manual Therapy:  gentle STM and TPR to bilat lumbar paraspinals and R glute in prone      PATIENT EDUCATION:  Education details: exercise form, rationale of exercises Person educated: Patient Education method: Explanation, demonstration, verbal cues Education comprehension: verbalized understanding, returned demonstration, verbal cues required  HOME EXERCISE PROGRAM: AQUATIC Access Code: [email or web address] URL: [email or web address] Prepared by: [facility] [address] This aquatic home exercise program from [employer] utilizes pictures from land based exercises, but has been adapted prior to lamination and issuance.    Land HEP: Access Code: [email or web address] URL: [email or web address] Date: 04/23/2024 Prepared by: Josette Rough  Updated HEP: - Supine Core Control with Heel Slide  - 1 x daily - 7 x weekly - 2 sets - 10 reps   ASSESSMENT:  CLINICAL IMPRESSION: Pt was issued laminated aquatic HEP and given instruction on it.  Pt tolerated treatment well having no complaints after treatment.  Pt has met all goals and verbalized readiness to d/c to HEP.     OBJECTIVE IMPAIRMENTS: Abnormal gait, decreased activity tolerance, decreased balance, decreased mobility, decreased strength, obesity, and pain.   ACTIVITY LIMITATIONS: carrying, lifting, bending, squatting, stairs, and transfers  PARTICIPATION LIMITATIONS: cleaning, shopping, and community activity  PERSONAL FACTORS: Time since onset of injury/illness/exacerbation are also affecting patient's  functional outcome.   REHAB POTENTIAL: Good  CLINICAL DECISION MAKING: Stable/uncomplicated  EVALUATION COMPLEXITY: Low   GOALS: Goals reviewed with patient? Yes  SHORT TERM GOALS: Target date: 05/15/24  Pt will tolerate full aquatic sessions consistently without increase in pain and with improving function to demonstrate good toleration and effectiveness of intervention.  Baseline: Goal status: Met 05/21/24  2.  Pt will consider gaining pool access for use of the properties of water  for chronic conditions maintaining mobility and minimizing pain.  Baseline: joining [facility] Goal status: 06/26/24    LONG TERM GOALS: Target date: 06/07/24  Pt to improve on LEFS by at least 9 point to demonstrate statistically significant Improvement in function. Baseline: 32/80 Goal status: GOAL MET  11/13  2.  Pt will improve on Tug test to <or= 13s (13.5 avg)  to demonstrate improvement  in lower extremity function, mobility and decreased fall risk. Baseline: 16.11 Goal status: GOAL MET  11/13  3.  Pt will report decrease in pain by at least 50% for improved toleration to activity/quality of life and to demonstrate improved management of pain. Baseline:  Goal status: GOAL MET  11/13  4.  Pt will improve strength in knee by 5 lbs  to demonstrate improved overall physical function Baseline: see chart Goal status:  GOAL MET  11/13  5.  Pt will be independent with land based and aquatic HEP for improved pain, strength, tolerance to activity, and functional mobility.   Goal status:  GOAL MET 06/26/24      PLAN:  PT FREQUENCY:1x/wk  PT DURATION:  2 weeks  Delon Aquas, PTA 06/26/24 5:35 PM [facility] [address] [place], [place], [address] Phone: [phone]   Fax:  [phone]

## 2024-06-27 NOTE — Addendum Note (Signed)
 Addended by: MARGRETTE LEIGH RAMAN on: 06/27/2024 08:54 AM   Modules accepted: Orders

## 2024-07-01 DIAGNOSIS — M0579 Rheumatoid arthritis with rheumatoid factor of multiple sites without organ or systems involvement: Secondary | ICD-10-CM | POA: Diagnosis not present

## 2024-07-10 ENCOUNTER — Other Ambulatory Visit: Payer: Self-pay | Admitting: Family Medicine

## 2024-07-10 DIAGNOSIS — Z1231 Encounter for screening mammogram for malignant neoplasm of breast: Secondary | ICD-10-CM

## 2024-07-16 ENCOUNTER — Other Ambulatory Visit: Payer: Self-pay

## 2024-07-16 ENCOUNTER — Other Ambulatory Visit: Payer: Self-pay | Admitting: Family Medicine

## 2024-07-16 MED ORDER — LEVOCETIRIZINE DIHYDROCHLORIDE 5 MG PO TABS
5.0000 mg | ORAL_TABLET | Freq: Every evening | ORAL | 1 refills | Status: AC
  Filled 2024-07-16: qty 90, 90d supply, fill #0

## 2024-07-16 MED ORDER — HYDROCHLOROTHIAZIDE 25 MG PO TABS
25.0000 mg | ORAL_TABLET | Freq: Every day | ORAL | 1 refills | Status: AC | PRN
  Filled 2024-07-16: qty 90, 90d supply, fill #0

## 2024-07-19 NOTE — Progress Notes (Unsigned)
 "        I, Ileana Collet, PhD, LAT, ATC acting as a scribe for Artist Lloyd, MD.  Nancy [name] is a 58 y.o. female who presents to [facility] today for exacerbation of her bilat knee pain. Pt was last seen by Dr. Lloyd on 04/25/24 and was given bilat knee steroid injections.  Today, pt reports bilat knee pain returned over the past wk. She is wanting to repeat bilat CSI today. She finished PT for her back and it's been feeling good.   Dx imaging: 12/01/22 R & L knee XR 11/12/2020 R knee XR  Pertinent review of systems: No fevers or chills  Relevant historical information: Rheumatoid arthritis   Exam:  BP 138/84   Pulse 62   Ht 5' 1 (1.549 m)   SpO2 98%   BMI 44.21 kg/m  General: Well Developed, well nourished, and in no acute distress.   MSK: Knees bilaterally moderate effusion.    Lab and Radiology Results  Procedure: Real-time Ultrasound Guided Injection of right knee joint superior lateral patella space Device: Philips Affiniti 50G/GE Logiq Images permanently stored and available for review in PACS Verbal informed consent obtained.  Discussed risks and benefits of procedure. Warned about infection, bleeding, hyperglycemia damage to structures among others. Patient expresses understanding and agreement Time-out conducted.   Noted no overlying erythema, induration, or other signs of local infection.   Skin prepped in a sterile fashion.   Local anesthesia: Topical Ethyl chloride.   With sterile technique and under real time ultrasound guidance: 40 mg of Kenalog  and 2 mL of Marcaine  injected into knee joint. Fluid seen entering the joint capsule.   Completed without difficulty   Pain immediately resolved suggesting accurate placement of the medication.   Advised to call if fevers/chills, erythema, induration, drainage, or persistent bleeding.   Images permanently stored and available for review in the ultrasound unit.  Impression: Technically  successful ultrasound guided injection.   Procedure: Real-time Ultrasound Guided Injection of left knee joint superior lateral patella space Device: Philips Affiniti 50G/GE Logiq Images permanently stored and available for review in PACS Verbal informed consent obtained.  Discussed risks and benefits of procedure. Warned about infection, bleeding, hyperglycemia damage to structures among others. Patient expresses understanding and agreement Time-out conducted.   Noted no overlying erythema, induration, or other signs of local infection.   Skin prepped in a sterile fashion.   Local anesthesia: Topical Ethyl chloride.   With sterile technique and under real time ultrasound guidance: 40 mg of Kenalog  and 2 mL of Marcaine  injected into knee joint. Fluid seen entering the joint capsule.   Completed without difficulty   Pain immediately resolved suggesting accurate placement of the medication.   Advised to call if fevers/chills, erythema, induration, drainage, or persistent bleeding.   Images permanently stored and available for review in the ultrasound unit.  Impression: Technically successful ultrasound guided injection.       Assessment and Plan: 58 y.o. female with bilateral knee pain due to DJD.  Plan for steroid injection both knees.  Check back as needed.   PDMP not reviewed this encounter. Orders Placed This Encounter  Procedures   US  LIMITED JOINT SPACE STRUCTURES LOW BILAT(NO LINKED CHARGES)    Reason for Exam (SYMPTOM  OR DIAGNOSIS REQUIRED):   bilateral knee pain    Preferred imaging location?:   [facility]   No orders of the defined types were placed in this encounter.  Discussed warning signs or symptoms. Please see discharge instructions. Patient expresses understanding.   The above documentation has been reviewed and is accurate and complete Artist Lloyd, M.D.   "

## 2024-07-22 ENCOUNTER — Ambulatory Visit: Admitting: Family Medicine

## 2024-07-22 ENCOUNTER — Other Ambulatory Visit: Payer: Self-pay

## 2024-07-22 VITALS — BP 138/84 | HR 62 | Ht 61.0 in

## 2024-07-22 DIAGNOSIS — M25561 Pain in right knee: Secondary | ICD-10-CM | POA: Diagnosis not present

## 2024-07-22 DIAGNOSIS — M25562 Pain in left knee: Secondary | ICD-10-CM

## 2024-07-22 DIAGNOSIS — G8929 Other chronic pain: Secondary | ICD-10-CM | POA: Diagnosis not present

## 2024-07-22 NOTE — Patient Instructions (Addendum)
 Thank you for coming in today.   You received an injection today. Seek immediate medical attention if the joint becomes red, extremely painful, or is oozing fluid.   Check back as needed

## 2024-07-29 ENCOUNTER — Other Ambulatory Visit (HOSPITAL_BASED_OUTPATIENT_CLINIC_OR_DEPARTMENT_OTHER): Payer: Self-pay

## 2024-07-29 ENCOUNTER — Other Ambulatory Visit: Payer: Self-pay

## 2024-07-29 ENCOUNTER — Ambulatory Visit: Admitting: Family Medicine

## 2024-07-29 ENCOUNTER — Encounter: Payer: Self-pay | Admitting: Family Medicine

## 2024-07-29 VITALS — BP 118/80 | HR 69 | Temp 98.3°F | Ht 61.0 in | Wt 234.0 lb

## 2024-07-29 DIAGNOSIS — J4520 Mild intermittent asthma, uncomplicated: Secondary | ICD-10-CM | POA: Diagnosis not present

## 2024-07-29 DIAGNOSIS — R059 Cough, unspecified: Secondary | ICD-10-CM

## 2024-07-29 LAB — POCT INFLUENZA A/B
Influenza A, POC: NEGATIVE
Influenza B, POC: NEGATIVE

## 2024-07-29 LAB — POC COVID19 BINAXNOW: SARS Coronavirus 2 Ag: NEGATIVE

## 2024-07-29 MED ORDER — AZITHROMYCIN 250 MG PO TABS
ORAL_TABLET | ORAL | 0 refills | Status: AC
  Filled 2024-07-29: qty 6, 5d supply, fill #0

## 2024-07-29 MED ORDER — PREDNISONE 20 MG PO TABS
20.0000 mg | ORAL_TABLET | Freq: Every day | ORAL | 0 refills | Status: AC
  Filled 2024-07-29 (×2): qty 7, 7d supply, fill #0

## 2024-07-29 MED ORDER — ALBUTEROL SULFATE HFA 108 (90 BASE) MCG/ACT IN AERS
2.0000 | INHALATION_SPRAY | Freq: Four times a day (QID) | RESPIRATORY_TRACT | 3 refills | Status: AC | PRN
  Filled 2024-07-29: qty 6.7, 25d supply, fill #0

## 2024-07-29 NOTE — Progress Notes (Signed)
" ° °  Nancy [name] is a 58 y.o. female who presents today for an office visit.  Assessment/Plan:  Cough  No red flags.  COVID and flu both negative.  She is having a mild flareup of her asthma likely due to viral URI.  Will refill her albuterol  inhaler and start prednisone  burst.  Will send a pocket prescription for azithromycin  with instruction to not start unless symptoms fail to improve over the next several days.  She can use over-the-counter meds as needed.  Encouraged hydration.  She will let us  know if not improving.     Subjective:  HPI:  See assessment / plan for status of chronic conditions.    Discussed the use of AI scribe software for clinical note transcription with the patient, who gave verbal consent to proceed.  History of Present Illness Nancy [name] is a 58 year old female with asthma who presents with worsening cough and chest congestion.  Her symptoms began on Christmas night with a sore throat, which improved by the next day. She then developed a cough that has worsened, particularly at night, and is now accompanied by chest congestion. She also experiences nasal and head congestion.  She has been using NyQuil for cold and flu symptoms, which initially helped her sleep but has not been effective recently. She denies fever and chills but feels cold in the evenings and experienced significant sweating the previous night. She is unsure if she has had a fever.  She has a history of asthma and used to be prone to bronchitis. She feels a little shortness of breath, which started the day before the visit. She does not currently have an albuterol  inhaler as her previous one expired.  Her COVID and flu tests were negative at the time of the visit.         Objective:  Physical Exam: BP 118/80   Pulse 69   Temp 98.3 F (36.8 C) (Temporal)   Ht 5' 1 (1.549 m)   SpO2 96%   BMI 44.21 kg/m   Gen: No acute distress, resting comfortably HEENT: TMs  clear. CV: Regular rate and rhythm with no murmurs appreciated Pulm: Normal work of breathing, clear to auscultation bilaterally with no crackles, wheezes, or rhonchi. Speaking in full sentences.  Neuro: Grossly normal, moves all extremities Psych: Normal affect and thought content      Nancy [name] M. Kennyth, MD 07/29/2024 9:58 AM  "

## 2024-08-06 ENCOUNTER — Other Ambulatory Visit (HOSPITAL_COMMUNITY): Payer: Self-pay

## 2024-08-08 ENCOUNTER — Ambulatory Visit
Admission: RE | Admit: 2024-08-08 | Discharge: 2024-08-08 | Disposition: A | Discharge status: Home / Self Care | Source: Ambulatory Visit | Attending: Family Medicine | Admitting: Family Medicine

## 2024-08-08 DIAGNOSIS — Z1231 Encounter for screening mammogram for malignant neoplasm of breast: Secondary | ICD-10-CM

## 2024-08-21 ENCOUNTER — Other Ambulatory Visit (HOSPITAL_COMMUNITY): Payer: Self-pay | Admitting: Family Medicine

## 2024-08-21 ENCOUNTER — Ambulatory Visit (HOSPITAL_COMMUNITY)
Admission: RE | Admit: 2024-08-21 | Discharge: 2024-08-21 | Disposition: A | Discharge status: Home / Self Care | Source: Ambulatory Visit | Attending: Family Medicine | Admitting: Family Medicine

## 2024-08-21 DIAGNOSIS — Z026 Encounter for examination for insurance purposes: Secondary | ICD-10-CM

## 2024-09-06 ENCOUNTER — Other Ambulatory Visit (HOSPITAL_COMMUNITY): Payer: Self-pay

## 2024-09-06 ENCOUNTER — Other Ambulatory Visit: Payer: Self-pay
